# Patient Record
Sex: Male | Born: 1950 | Race: White | Hispanic: No | Smoking: Former smoker
Health system: Southern US, Community
[De-identification: ages and names within clinical notes are randomized; demographics above are authoritative.]

## PROBLEM LIST (undated history)

## (undated) DIAGNOSIS — J40 Bronchitis, not specified as acute or chronic: Secondary | ICD-10-CM

## (undated) DIAGNOSIS — B353 Tinea pedis: Secondary | ICD-10-CM

## (undated) DIAGNOSIS — R739 Hyperglycemia, unspecified: Secondary | ICD-10-CM

## (undated) DIAGNOSIS — E785 Hyperlipidemia, unspecified: Secondary | ICD-10-CM

## (undated) DIAGNOSIS — K469 Unspecified abdominal hernia without obstruction or gangrene: Secondary | ICD-10-CM

## (undated) DIAGNOSIS — J45991 Cough variant asthma: Secondary | ICD-10-CM

## (undated) DIAGNOSIS — N509 Disorder of male genital organs, unspecified: Secondary | ICD-10-CM

## (undated) DIAGNOSIS — M1990 Unspecified osteoarthritis, unspecified site: Secondary | ICD-10-CM

## (undated) DIAGNOSIS — G4700 Insomnia, unspecified: Secondary | ICD-10-CM

## (undated) DIAGNOSIS — M549 Dorsalgia, unspecified: Secondary | ICD-10-CM

## (undated) DIAGNOSIS — N529 Male erectile dysfunction, unspecified: Secondary | ICD-10-CM

## (undated) DIAGNOSIS — K649 Unspecified hemorrhoids: Secondary | ICD-10-CM

## (undated) DIAGNOSIS — B009 Herpesviral infection, unspecified: Secondary | ICD-10-CM

## (undated) DIAGNOSIS — M109 Gout, unspecified: Secondary | ICD-10-CM

## (undated) DIAGNOSIS — H9190 Unspecified hearing loss, unspecified ear: Secondary | ICD-10-CM

## (undated) DIAGNOSIS — A6000 Herpesviral infection of urogenital system, unspecified: Secondary | ICD-10-CM

## (undated) DIAGNOSIS — I10 Essential (primary) hypertension: Secondary | ICD-10-CM

## (undated) HISTORY — DX: Unspecified hearing loss, unspecified ear: H91.90

## (undated) HISTORY — DX: Hyperlipidemia, unspecified: E78.5

## (undated) HISTORY — DX: Gout, unspecified: M10.9

## (undated) HISTORY — PX: TONSILLECTOMY: SUR1361

## (undated) HISTORY — DX: Insomnia, unspecified: G47.00

## (undated) HISTORY — DX: Bronchitis, not specified as acute or chronic: J40

## (undated) HISTORY — DX: Essential (primary) hypertension: I10

## (undated) HISTORY — DX: Herpesviral infection of urogenital system, unspecified: A60.00

## (undated) HISTORY — DX: Cough variant asthma: J45.991

## (undated) HISTORY — DX: Unspecified hemorrhoids: K64.9

## (undated) HISTORY — DX: Disorder of male genital organs, unspecified: N50.9

## (undated) HISTORY — DX: Hyperglycemia, unspecified: R73.9

## (undated) HISTORY — DX: Tinea pedis: B35.3

## (undated) HISTORY — DX: Dorsalgia, unspecified: M54.9

## (undated) HISTORY — DX: Herpesviral infection, unspecified: B00.9

## (undated) HISTORY — DX: Unspecified osteoarthritis, unspecified site: M19.90

## (undated) HISTORY — DX: Male erectile dysfunction, unspecified: N52.9

## (undated) HISTORY — DX: Unspecified abdominal hernia without obstruction or gangrene: K46.9

---
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
## 1982-12-31 HISTORY — PX: OTHER SURGICAL HISTORY: SHX169

## 1990-12-31 HISTORY — PX: OTHER SURGICAL HISTORY: SHX169

## 1996-01-01 HISTORY — PX: NASAL SEPTUM SURGERY: SHX37

## 1996-12-31 LAB — HM COLONOSCOPY

## 1996-12-31 LAB — HM SIGMOIDOSCOPY: HM Sigmoidoscopy: NORMAL

## 1998-06-06 ENCOUNTER — Ambulatory Visit (HOSPITAL_COMMUNITY): Admission: RE | Admit: 1998-06-06 | Discharge: 1998-06-06 | Payer: Self-pay | Admitting: Internal Medicine

## 1998-12-31 HISTORY — PX: OTHER SURGICAL HISTORY: SHX169

## 2000-12-31 HISTORY — PX: UMBILICAL HERNIA REPAIR: SHX196

## 2005-03-06 ENCOUNTER — Emergency Department (HOSPITAL_COMMUNITY): Admission: EM | Admit: 2005-03-06 | Discharge: 2005-03-06 | Payer: Self-pay | Admitting: Emergency Medicine

## 2013-04-30 ENCOUNTER — Other Ambulatory Visit: Payer: Self-pay | Admitting: *Deleted

## 2013-04-30 MED ORDER — ACYCLOVIR 400 MG PO TABS: ORAL_TABLET | ORAL | Status: DC

## 2013-06-02 ENCOUNTER — Other Ambulatory Visit: Payer: Self-pay | Admitting: *Deleted

## 2013-06-02 MED ORDER — ZOLPIDEM TARTRATE 5 MG PO TABS: 5.0000 mg | ORAL_TABLET | Freq: Every evening | ORAL | Status: DC | PRN

## 2013-08-17 ENCOUNTER — Other Ambulatory Visit: Payer: Self-pay | Admitting: Geriatric Medicine

## 2013-08-17 MED ORDER — FEBUXOSTAT 40 MG PO TABS: 40.0000 mg | ORAL_TABLET | Freq: Every day | ORAL | Status: DC

## 2013-08-19 ENCOUNTER — Encounter: Payer: Self-pay | Admitting: Internal Medicine

## 2013-11-02 ENCOUNTER — Other Ambulatory Visit: Payer: Self-pay | Admitting: Internal Medicine

## 2013-11-03 ENCOUNTER — Other Ambulatory Visit: Payer: Self-pay | Admitting: *Deleted

## 2013-11-30 ENCOUNTER — Other Ambulatory Visit: Payer: Medicare HMO

## 2013-11-30 ENCOUNTER — Encounter: Payer: Self-pay | Admitting: *Deleted

## 2013-11-30 ENCOUNTER — Other Ambulatory Visit: Payer: Self-pay | Admitting: *Deleted

## 2013-11-30 DIAGNOSIS — E785 Hyperlipidemia, unspecified: Secondary | ICD-10-CM

## 2013-11-30 DIAGNOSIS — I10 Essential (primary) hypertension: Secondary | ICD-10-CM

## 2013-11-30 DIAGNOSIS — M109 Gout, unspecified: Secondary | ICD-10-CM

## 2013-12-01 LAB — LIPID PANEL
Chol/HDL Ratio: 3.9 ratio units (ref 0.0–5.0)
LDL Calculated: 129 mg/dL — ABNORMAL HIGH (ref 0–99)

## 2013-12-01 LAB — COMPREHENSIVE METABOLIC PANEL
Albumin: 4.6 g/dL (ref 3.6–4.8)
BUN/Creatinine Ratio: 11 (ref 10–22)
Creatinine, Ser: 0.94 mg/dL (ref 0.76–1.27)
GFR calc Af Amer: 99 mL/min/{1.73_m2} (ref >59)
GFR calc non Af Amer: 85 mL/min/{1.73_m2} (ref >59)
Potassium: 4.7 mmol/L (ref 3.5–5.2)
Sodium: 142 mmol/L (ref 134–144)
Total Protein: 6.6 g/dL (ref 6.0–8.5)

## 2013-12-01 LAB — URIC ACID: Uric Acid: 6.1 mg/dL (ref 3.7–8.6)

## 2013-12-02 ENCOUNTER — Encounter: Payer: Self-pay | Admitting: Internal Medicine

## 2013-12-02 ENCOUNTER — Ambulatory Visit (INDEPENDENT_AMBULATORY_CARE_PROVIDER_SITE_OTHER): Payer: Medicare HMO | Admitting: Internal Medicine

## 2013-12-02 VITALS — BP 146/98 | HR 79 | Resp 14 | Ht 69.08 in | Wt 216.0 lb

## 2013-12-02 DIAGNOSIS — B009 Herpesviral infection, unspecified: Secondary | ICD-10-CM | POA: Insufficient documentation

## 2013-12-02 DIAGNOSIS — G4700 Insomnia, unspecified: Secondary | ICD-10-CM

## 2013-12-02 DIAGNOSIS — R739 Hyperglycemia, unspecified: Secondary | ICD-10-CM | POA: Insufficient documentation

## 2013-12-02 DIAGNOSIS — I10 Essential (primary) hypertension: Secondary | ICD-10-CM | POA: Insufficient documentation

## 2013-12-02 DIAGNOSIS — A6000 Herpesviral infection of urogenital system, unspecified: Secondary | ICD-10-CM

## 2013-12-02 DIAGNOSIS — E785 Hyperlipidemia, unspecified: Secondary | ICD-10-CM | POA: Insufficient documentation

## 2013-12-02 DIAGNOSIS — N529 Male erectile dysfunction, unspecified: Secondary | ICD-10-CM | POA: Insufficient documentation

## 2013-12-02 DIAGNOSIS — M109 Gout, unspecified: Secondary | ICD-10-CM | POA: Insufficient documentation

## 2013-12-02 DIAGNOSIS — R7309 Other abnormal glucose: Secondary | ICD-10-CM

## 2013-12-02 DIAGNOSIS — Z23 Encounter for immunization: Secondary | ICD-10-CM

## 2013-12-02 MED ORDER — AMLODIPINE BESYLATE 5 MG PO TABS: ORAL_TABLET | ORAL | Status: DC

## 2013-12-02 MED ORDER — TADALAFIL 20 MG PO TABS: 20.0000 mg | ORAL_TABLET | Freq: Every day | ORAL | Status: DC | PRN

## 2013-12-02 MED ORDER — ACYCLOVIR 400 MG PO TABS: ORAL_TABLET | ORAL | Status: DC

## 2013-12-02 MED ORDER — FEBUXOSTAT 40 MG PO TABS: ORAL_TABLET | ORAL | Status: DC

## 2013-12-02 MED ORDER — LOSARTAN POTASSIUM 100 MG PO TABS: ORAL_TABLET | ORAL | Status: DC

## 2013-12-02 MED ORDER — ZOLPIDEM TARTRATE 5 MG PO TABS: ORAL_TABLET | ORAL | Status: DC

## 2013-12-02 MED ORDER — TETANUS-DIPHTH-ACELL PERTUSSIS 5-2.5-18.5 LF-MCG/0.5 IM SUSP: 0.5000 mL | Freq: Once | INTRAMUSCULAR | Status: DC

## 2013-12-02 NOTE — Patient Instructions (Signed)
Add amlodipine to control BP

## 2013-12-02 NOTE — Progress Notes (Signed)
Patient ID: Rodney Foster, male   DOB: [birth year], 63 y.o.   MRN: [id number]         Place of Service:  [facility]  PCP: Kimber Relic, MD  Code Status: FULL CODE  No Known Allergies  Chief Complaint  Patient presents with  . Annual Exam    physical with no labs prior  . Immunizations    patient would like to update flu vaccine today, rx to be given fot TD vaccine     HPI:  Genital herpes - no recent attacks. Using acyclovir (ZOVIRAX) 400 MG tablet.  Hyperlipidemia -: controlled  Gout -: controlled. Using febuxostat (ULORIC) 40 MG tablet  Hypertension - controlled on losartan (COZAAR) 100 MG tablet, amLODipine (NORVASC) 5 MG tablet  Hyperglycemia:normal glucose on most recent lab.  Insomnia, unspecified - Benefits from zolpidem (AMBIEN) 5 MG tablet  Impotence - Plan: tadalafil (CIALIS) 20 MG tablet  Immunization due - recommended Tdap (BOOSTRIX) 5-2.5-18.5 LF-MCG/0.5 injection  Need for prophylactic vaccination and inoculation against influenza  Needs colonoscopy.      Past Medical History  Diagnosis Date  . Genital herpes   . Herpes simplex   . Tinea pedis, right   . Hyperlipidemia   . Gout   . Hypertension   . Hyperglycemia   . Hearing loss   . Hemorrhoids   . Bronchitis   . Hernia   . Unspecified disorder of male genital organs   . Osteoarthrosis, unspecified whether generalized or localized, unspecified site   . Backache, unspecified   . Insomnia, unspecified   . Problems with sexual function     Past Surgical History  Procedure Laterality Date  . Tonsillectomy  1959  . Fracture left femur  1984  . Nasal septum surgery  1997  . Ganglion repair of left forarm  2000  . Umbilical hernia repair  2002  . Ivp  35    ML old comp fracture    CONSULTANTS ENT: Dr. Arletha Grippe GI: Dr. Virginia Rochester  General surgery: Dr. Orson Slick Hand surgery: Dr. Mina Marble Urology: Dr. Isabel Caprice Orthopedics: Dr. Thurston Hole  PAST PROCEDURES 1991 flexible sigmoidoscopy/barium enema:  Normal 1992 IVP: Normal. Incidental finding of old compression fracture of lumbar vertebra. 1994 CT abdomen 1998 flexible sigmoidoscopy: Hemorrhoids. Rectal polyp. 1998 colonoscopy: Rectal polyp 1999 CT sinus 1999 CT chest: Right lower lobe infiltrate 2000 CT abdomen/pelvis: Normal  Social History: History   Social History  . Marital Status: Divorced    Spouse Name: N/A    Number of Children: N/A  . Years of Education: N/A   Social History Main Topics  . Smoking status: Former Games developer  . Smokeless tobacco: Never Used     Comment: Quit about 30 years ago or longer   . Alcohol Use: 0.0 oz/week    6-7 Glasses of wine per week  . Drug Use: No  . Sexual Activity: None   Other Topics Concern  . None   Social History Narrative  . None    Family History Family Status  Relation Status Death Age  . Mother Deceased 66  . Father Deceased 70  . Brother Deceased   . Brother Alive   . Daughter Alive   . Son Alive   . Daughter Alive    Family History  Problem Relation Age of Onset  . COPD Mother   . Aortic aneurysm Father      Medications: Patient's Medications  New Prescriptions   No medications on file  Previous Medications  ACYCLOVIR (ZOVIRAX) 400 MG TABLET    Take one tablet to prevent herpes. Take one 5 times a day to treat active herpes.   FEBUXOSTAT (ULORIC) 40 MG TABLET    Take 1 tablet (40 mg total) by mouth daily.   LOSARTAN (COZAAR) 100 MG TABLET    Take 100 mg by mouth daily.   TADALAFIL (CIALIS) 20 MG TABLET    Take 20 mg by mouth daily as needed for erectile dysfunction.   TDAP (BOOSTRIX) 5-2.5-18.5 LF-MCG/0.5 INJECTION    Inject 0.5 mLs into the muscle once.   ZOLPIDEM (AMBIEN) 5 MG TABLET    Take one tablet nightly at bedtime as needed sleep  Modified Medications   No medications on file  Discontinued Medications   No medications on file    Immunization History  Administered Date(s) Administered  . Influenza-Unspecified 12/05/2011  . Td  12/31/1994     Review of Systems  Constitutional: Negative for fever, chills, weight loss and malaise/fatigue.  HENT: Negative for congestion and hearing loss.   Eyes: Negative for blurred vision and double vision.       Corrective lenses  Respiratory: Negative for cough, hemoptysis and shortness of breath.   Cardiovascular: Negative for chest pain, palpitations, orthopnea, leg swelling and PND.  Gastrointestinal: Negative for heartburn, nausea, vomiting, abdominal pain, constipation and blood in stool.       History of hemorrhoids and 1 colon polyp.  Genitourinary: Negative for dysuria, urgency, frequency, hematuria and flank pain.  Musculoskeletal: Negative for back pain, joint pain, myalgias and neck pain.  Skin: Negative for itching and rash.  Neurological: Negative.  Negative for weakness and headaches.  Psychiatric/Behavioral: Negative.      Filed Vitals:   12/02/13 1428  BP: 146/98  Pulse: 79  Resp: 14  Height: 5' 9.08" (1.755 m)  Weight: 216 lb (97.977 kg)  SpO2: 96%   Physical Exam  Constitutional: He is oriented to person, place, and time. He appears well-developed and well-nourished. No distress.  HENT:  Head: Normocephalic.  Right Ear: External ear normal.  Left Ear: External ear normal.  Mouth/Throat: Oropharynx is clear and moist.  Eyes: Conjunctivae and EOM are normal. Pupils are equal, round, and reactive to light.  Wears corrective lenses  Neck: Normal range of motion. Neck supple. No JVD present. No tracheal deviation present. No thyromegaly present.  Cardiovascular: Normal rate, regular rhythm, normal heart sounds and intact distal pulses.  Exam reveals no gallop and no friction rub.   No murmur heard. Pulmonary/Chest: No respiratory distress. He has no wheezes. He has no rales.  Abdominal: He exhibits no distension and no mass. There is no tenderness. No hernia.  Genitourinary: Rectum normal, prostate normal and penis normal.  Musculoskeletal: He  exhibits no edema and no tenderness.  Neurological: He is alert and oriented to person, place, and time. He displays normal reflexes. No cranial nerve deficit. He exhibits normal muscle tone. Coordination normal.  Skin: No erythema. No pallor.  Psychiatric: He has a normal mood and affect. His behavior is normal. Judgment and thought content normal.      Labs reviewed: Abstract on 11/30/2013  Component Date Value Range Status  . HM Sigmoidoscopy 12/31/1996 normal   Final  . HM Colonoscopy 12/31/1996 rectal polyp   Final  Appointment on 11/30/2013  Component Date Value Range Status  . Uric Acid 11/30/2013 6.1  3.7 - 8.6 mg/dL Final              Therapeutic target  for gout patients: <6.0  . Cholesterol, Total 11/30/2013 232* 100 - 199 mg/dL Final  . Triglycerides 11/30/2013 218* 0 - 149 mg/dL Final  . HDL 16/10/9603 59  >39 mg/dL Final   Comment: According to ATP-III Guidelines, HDL-C >59 mg/dL is considered a                          negative risk factor for CHD.  Marland Kitchen VLDL Cholesterol Cal 11/30/2013 44* 5 - 40 mg/dL Final  . LDL Calculated 11/30/2013 540* 0 - 99 mg/dL Final  . Chol/HDL Ratio 11/30/2013 3.9  0.0 - 5.0 ratio units Final   Comment:                                   T. Chol/HDL Ratio                                                                      Men  Women                                                        1/2 Avg.Risk  3.4    3.3                                                            Avg.Risk  5.0    4.4                                                         2X Avg.Risk  9.6    7.1                                                         3X Avg.Risk 23.4   11.0  . Glucose 11/30/2013 86  65 - 99 mg/dL Final  . BUN 98/10/9146 10  8 - 27 mg/dL Final  . Creatinine, Ser 11/30/2013 0.94  0.76 - 1.27 mg/dL Final  . GFR calc non Af Amer 11/30/2013 85  >59 mL/min/1.73 Final  . GFR calc Af Amer 11/30/2013 99  >59 mL/min/1.73 Final  . BUN/Creatinine Ratio 11/30/2013  11  10 - 22 Final  . Sodium 11/30/2013 142  134 - 144 mmol/L Final  . Potassium 11/30/2013 4.7  3.5 - 5.2 mmol/L Final  . Chloride 11/30/2013 100  97 - 108 mmol/L Final  . CO2 11/30/2013 22  18 - 29 mmol/L Final  . Calcium 11/30/2013 9.3  8.6 - 10.2 mg/dL Final  . Total Protein 11/30/2013 6.6  6.0 - 8.5 g/dL Final  . Albumin 16/10/9603 4.6  3.6 - 4.8 g/dL Final  . Globulin, Total 11/30/2013 2.0  1.5 - 4.5 g/dL Final  . Albumin/Globulin Ratio 11/30/2013 2.3  1.1 - 2.5 Final  . Total Bilirubin 11/30/2013 0.3  0.0 - 1.2 mg/dL Final  . Alkaline Phosphatase 11/30/2013 93  39 - 117 IU/L Final  . AST 11/30/2013 35  0 - 40 IU/L Final  . ALT 11/30/2013 52* 0 - 44 IU/L Final      Assessment/ Plan Genital herpes - Plan: acyclovir (ZOVIRAX) 400 MG tablet  Hyperlipidemia - Plan: Lipid panel  Gout - Plan: febuxostat (ULORIC) 40 MG tablet  Hypertension - Plan: losartan (COZAAR) 100 MG tablet, amLODipine (NORVASC) 5 MG tablet  Hyperglycemia: Routine monitoring. Dietary control only.  Insomnia, unspecified - Plan: zolpidem (AMBIEN) 5 MG tablet  Impotence - Plan: tadalafil (CIALIS) 20 MG tablet  Immunization due - Plan: Tdap (BOOSTRIX) 5-2.5-18.5 LF-MCG/0.5 injection  Need for prophylactic vaccination and inoculation against influenza

## 2013-12-03 ENCOUNTER — Ambulatory Visit
Admission: RE | Admit: 2013-12-03 | Discharge: 2013-12-03 | Disposition: A | Discharge status: Home / Self Care | Payer: Medicare HMO | Source: Ambulatory Visit | Attending: Nurse Practitioner | Admitting: Nurse Practitioner

## 2013-12-03 ENCOUNTER — Encounter: Payer: Self-pay | Admitting: Nurse Practitioner

## 2013-12-03 ENCOUNTER — Ambulatory Visit (INDEPENDENT_AMBULATORY_CARE_PROVIDER_SITE_OTHER): Payer: Medicare HMO | Admitting: Nurse Practitioner

## 2013-12-03 ENCOUNTER — Other Ambulatory Visit: Payer: Self-pay | Admitting: *Deleted

## 2013-12-03 ENCOUNTER — Telehealth: Payer: Self-pay | Admitting: *Deleted

## 2013-12-03 VITALS — BP 136/82 | HR 104 | Temp 100.0°F | Resp 20 | Wt 216.0 lb

## 2013-12-03 DIAGNOSIS — R05 Cough: Secondary | ICD-10-CM

## 2013-12-03 DIAGNOSIS — R0989 Other specified symptoms and signs involving the circulatory and respiratory systems: Secondary | ICD-10-CM

## 2013-12-03 DIAGNOSIS — B9789 Other viral agents as the cause of diseases classified elsewhere: Secondary | ICD-10-CM

## 2013-12-03 MED ORDER — OSELTAMIVIR PHOSPHATE 75 MG PO CAPS: 75.0000 mg | ORAL_CAPSULE | Freq: Two times a day (BID) | ORAL | Status: DC

## 2013-12-03 NOTE — Progress Notes (Signed)
Patient ID: Rodney Foster, male   DOB: 02-22-1950, 63 y.o.   MRN: [id number]    No Known Allergies  Chief Complaint  Patient presents with  . Acute Visit    nasal/chest congestion, achy all over since about 3:00 am.  had flu shot 12/3  he took 2 baby aspriin this am  but through them up.    HPI: Patient is a 63 y.o. male seen in the office today for fever for 1 day Starting at 3 am today feeling achy, muscle pains, vomiting X1 this morning- Having nausea, no diarrhea Nasal drainage into chest that he is coughing up green sputum.  No shortness of breath, no sore throat, no trouble swallowing  No appetite. unaware of any sick contacts  CXR done- no pneumonia seen  Review of Systems:  Review of Systems  Constitutional: Positive for fever, chills and malaise/fatigue.  HENT: Negative for congestion, ear pain and sore throat.   Respiratory: Positive for cough and sputum production. Negative for shortness of breath and wheezing.   Cardiovascular: Negative for chest pain, palpitations and leg swelling.  Gastrointestinal: Positive for nausea and vomiting. Negative for abdominal pain, diarrhea and constipation.  Musculoskeletal: Positive for myalgias.  Skin: Negative.   Neurological: Positive for headaches. Negative for dizziness.     Past Medical History  Diagnosis Date  . Genital herpes   . Herpes simplex   . Tinea pedis, right   . Hyperlipidemia   . Gout   . Hypertension   . Hyperglycemia   . Hearing loss   . Hemorrhoids   . Bronchitis   . Hernia   . Unspecified disorder of male genital organs   . Osteoarthrosis, unspecified whether generalized or localized, unspecified site   . Backache, unspecified   . Insomnia, unspecified   . Impotence    Past Surgical History  Procedure Laterality Date  . Tonsillectomy  1959  . Fracture left femur  1984  . Nasal septum surgery  1997  . Ganglion repair of left forarm  2000  . Umbilical hernia repair  2002  . Ivp  73    ML old  comp fracture   Social History:   reports that he has quit smoking. He has never used smokeless tobacco. He reports that he drinks alcohol. He reports that he does not use illicit drugs.  Family History  Problem Relation Age of Onset  . COPD Mother   . Aortic aneurysm Father     Medications: Patient's Medications  New Prescriptions   No medications on file  Previous Medications   ACYCLOVIR (ZOVIRAX) 400 MG TABLET    Take one tablet to prevent herpes. Take one 5 times a day to treat active herpes.   AMLODIPINE (NORVASC) 5 MG TABLET    One tablet daily to control BP   FEBUXOSTAT (ULORIC) 40 MG TABLET    One daily to prevent gout   LOSARTAN (COZAAR) 100 MG TABLET    One daily to control BP   TADALAFIL (CIALIS) 20 MG TABLET    Take 1 tablet (20 mg total) by mouth daily as needed for erectile dysfunction.   TDAP (BOOSTRIX) 5-2.5-18.5 LF-MCG/0.5 INJECTION    Inject 0.5 mLs into the muscle once.   ZOLPIDEM (AMBIEN) 5 MG TABLET    Take one tablet nightly at bedtime as needed sleep  Modified Medications   No medications on file  Discontinued Medications   No medications on file     Physical Exam:  Filed Vitals:  12/03/13 1422  BP: 136/82  Pulse: 104  Temp: 100 F (37.8 C)  TempSrc: Oral  Resp: 20  Weight: 216 lb (97.977 kg)  SpO2: 96%   Physical Exam  Constitutional: He is oriented to person, place, and time and well-developed, well-nourished, and in no distress. No distress.  HENT:  Head: Normocephalic and atraumatic.  Right Ear: External ear normal.  Left Ear: External ear normal.  Nose: Nose normal.  Mouth/Throat: Oropharynx is clear and moist. No oropharyngeal exudate.  Eyes: Conjunctivae and EOM are normal. Pupils are equal, round, and reactive to light.  Neck: Normal range of motion. Neck supple. No thyromegaly present.  Cardiovascular: Normal rate, regular rhythm and normal heart sounds.   Pulmonary/Chest: Effort normal and breath sounds normal. No respiratory  distress. He has no wheezes. He exhibits no tenderness.  Abdominal: Soft. Bowel sounds are normal. He exhibits no distension.  Musculoskeletal: He exhibits no edema and no tenderness.  Lymphadenopathy:    He has no cervical adenopathy.  Neurological: He is alert and oriented to person, place, and time.  Skin: Skin is warm and dry. He is not diaphoretic.  Psychiatric: Affect normal.     Labs reviewed: Basic Metabolic Panel:  Recent Labs  16/10/96 1001  NA 142  K 4.7  CL 100  CO2 22  GLUCOSE 86  BUN 10  CREATININE 0.94  CALCIUM 9.3   Liver Function Tests:  Recent Labs  11/30/13 1001  AST 35  ALT 52*  ALKPHOS 93  BILITOT 0.3  PROT 6.6   No results found for this basename: LIPASE, AMYLASE,  in the last 8760 hours No results found for this basename: AMMONIA,  in the last 8760 hours CBC: No results found for this basename: WBC, NEUTROABS, HGB, HCT, MCV, PLT,  in the last 8760 hours Lipid Panel:  Recent Labs  11/30/13 1001  HDL 59  LDLCALC 129*  TRIG 218*  CHOLHDL 3.9   TSH: No results found for this basename: TSH,  in the last 8760 hours A1C: No components found with this basename: A1C,   Assessment/Plan 1. Viral respiratory illness -educated pt this was not from the flu shot  -encouraged pt to increase fluids and eat as tolerated, advanced diet as tolerates -may take tylenol around the clock 1000 mg every 8 hours for 4 days (max dose of 3000 mg in 24 hours) for body aches -can not test for flu due to no influenza A&B swab in clinic therefore Will send to pharm oseltamivir (TAMIFLU) 75 MG capsule; Take 1 capsule (75 mg total) by mouth 2 (two) times daily.  Dispense: 10 capsule; Refill: 0 -mucinex DM 1 tablet twice daily for cough and congestion with full glass of water  - to follow up in clinic on Monday if no better or seek emergency care if becomes short of breath, unable to tolerate PO intake, increase in fevers pt understands

## 2013-12-03 NOTE — Patient Instructions (Signed)
May take Mucinex DM 1 tablet twice daily  Take tylenol 1000 mg- (2 of the 500 mg tablets) 3 times daily around the clock for the next 4 days then as needed for fevers or body aches Follow up with [place] if no better on Monday If unable to keep down fluids and food seek medical care before Monday

## 2013-12-03 NOTE — Telephone Encounter (Signed)
Patient walked into clinic claiming he got his flu shot 12/02/2013 and woke up very sick this morning 3:00am. I could hear the congestion in patient's lungs. Patient claims no chest pains but nausea and cough and congestion. States that he thinks the flu shot gave him the flu. I spoke with Shanda Bumps and she stated to send the patient to get a chest x-ray and come back at 2:15 to be seen. Patient agreed and gave him directions to [facility]. Placed order into Epic.

## 2013-12-10 ENCOUNTER — Telehealth: Payer: Self-pay | Admitting: *Deleted

## 2013-12-10 NOTE — Telephone Encounter (Signed)
Feeling no Better. Fever is gone but sinuses stopped up, fatigue and chest congestion. Wants something called in. Wants to get better so he can go back to work.

## 2013-12-10 NOTE — Telephone Encounter (Signed)
Increase fluid intake; mucinex DM is OTC take this twice daily with full glass of water May use plain saline spray into nares as needed

## 2013-12-11 NOTE — Telephone Encounter (Signed)
Patient Notified

## 2014-01-08 ENCOUNTER — Other Ambulatory Visit: Payer: Self-pay | Admitting: *Deleted

## 2014-01-08 DIAGNOSIS — G4700 Insomnia, unspecified: Secondary | ICD-10-CM

## 2014-01-08 DIAGNOSIS — I10 Essential (primary) hypertension: Secondary | ICD-10-CM

## 2014-01-08 DIAGNOSIS — N529 Male erectile dysfunction, unspecified: Secondary | ICD-10-CM

## 2014-01-08 DIAGNOSIS — M109 Gout, unspecified: Secondary | ICD-10-CM

## 2014-01-08 MED ORDER — ZOLPIDEM TARTRATE 5 MG PO TABS: ORAL_TABLET | ORAL | Status: DC

## 2014-01-08 MED ORDER — TADALAFIL 20 MG PO TABS: 20.0000 mg | ORAL_TABLET | Freq: Every day | ORAL | Status: DC | PRN

## 2014-01-08 MED ORDER — FEBUXOSTAT 40 MG PO TABS: ORAL_TABLET | ORAL | Status: DC

## 2014-01-08 MED ORDER — AMLODIPINE BESYLATE 5 MG PO TABS: ORAL_TABLET | ORAL | Status: DC

## 2014-01-12 ENCOUNTER — Telehealth: Payer: Self-pay | Admitting: *Deleted

## 2014-01-12 DIAGNOSIS — A6000 Herpesviral infection of urogenital system, unspecified: Secondary | ICD-10-CM

## 2014-01-12 DIAGNOSIS — N529 Male erectile dysfunction, unspecified: Secondary | ICD-10-CM

## 2014-01-12 DIAGNOSIS — M109 Gout, unspecified: Secondary | ICD-10-CM

## 2014-01-12 DIAGNOSIS — G4700 Insomnia, unspecified: Secondary | ICD-10-CM

## 2014-01-12 DIAGNOSIS — I10 Essential (primary) hypertension: Secondary | ICD-10-CM

## 2014-01-12 MED ORDER — LOSARTAN POTASSIUM 100 MG PO TABS
ORAL_TABLET | ORAL | Status: DC
Start: 2014-01-12 — End: 2015-01-28

## 2014-01-12 MED ORDER — AMLODIPINE BESYLATE 5 MG PO TABS: ORAL_TABLET | ORAL | Status: DC

## 2014-01-12 MED ORDER — TADALAFIL 20 MG PO TABS: 20.0000 mg | ORAL_TABLET | Freq: Every day | ORAL | Status: DC | PRN

## 2014-01-12 MED ORDER — ACYCLOVIR 400 MG PO TABS: ORAL_TABLET | ORAL | Status: DC

## 2014-01-12 MED ORDER — ZOLPIDEM TARTRATE 5 MG PO TABS: ORAL_TABLET | ORAL | Status: DC

## 2014-01-12 MED ORDER — FEBUXOSTAT 40 MG PO TABS: ORAL_TABLET | ORAL | Status: DC

## 2014-01-12 NOTE — Telephone Encounter (Signed)
Called and spoke with Pharmacist at [facility] [phone] and called in all of patient's Rx's for mail order.

## 2014-03-09 ENCOUNTER — Other Ambulatory Visit: Payer: Commercial Managed Care - HMO

## 2014-03-09 DIAGNOSIS — E785 Hyperlipidemia, unspecified: Secondary | ICD-10-CM

## 2014-03-10 ENCOUNTER — Encounter: Payer: Self-pay | Admitting: Internal Medicine

## 2014-03-10 ENCOUNTER — Ambulatory Visit (INDEPENDENT_AMBULATORY_CARE_PROVIDER_SITE_OTHER): Payer: Medicare HMO | Admitting: Internal Medicine

## 2014-03-10 VITALS — BP 142/80 | HR 80 | Temp 97.4°F | Resp 20 | Ht 69.0 in | Wt 202.6 lb

## 2014-03-10 DIAGNOSIS — E785 Hyperlipidemia, unspecified: Secondary | ICD-10-CM

## 2014-03-10 DIAGNOSIS — I10 Essential (primary) hypertension: Secondary | ICD-10-CM

## 2014-03-10 LAB — LIPID PANEL
CHOL/HDL RATIO: 3.5 ratio (ref 0.0–5.0)
Cholesterol, Total: 214 mg/dL — ABNORMAL HIGH (ref 100–199)
HDL: 61 mg/dL (ref >39)
LDL Calculated: 117 mg/dL — ABNORMAL HIGH (ref 0–99)
Triglycerides: 178 mg/dL — ABNORMAL HIGH (ref 0–149)
VLDL Cholesterol Cal: 36 mg/dL (ref 5–40)

## 2014-03-10 NOTE — Patient Instructions (Addendum)
Continue medications as listed 

## 2014-03-10 NOTE — Progress Notes (Signed)
Patient ID: Rodney Foster, male   DOB: 1950/09/03, 64 y.o.   MRN: [id number]    Location:    [facility]  Place of Service:  OFFICE   No Known Allergies  Chief Complaint  Patient presents with  . Follow-up    lab results    HPI:   Hyperlipidemia: improved. Has lost 14# since last visit.  Hypertension: improved. Tolerating losartan and amlodipine.    Medications: Patient's Medications  New Prescriptions   No medications on file  Previous Medications   ACYCLOVIR (ZOVIRAX) 400 MG TABLET    Take one tablet to prevent herpes. Take one 5 times a day to treat active herpes.   AMLODIPINE (NORVASC) 5 MG TABLET    One tablet daily to control BP   FEBUXOSTAT (ULORIC) 40 MG TABLET    One daily to prevent gout   LOSARTAN (COZAAR) 100 MG TABLET    One daily to control BP   TADALAFIL (CIALIS) 20 MG TABLET    Take 1 tablet (20 mg total) by mouth daily as needed for erectile dysfunction.   ZOLPIDEM (AMBIEN) 5 MG TABLET    Take one tablet nightly at bedtime as needed sleep  Modified Medications   No medications on file  Discontinued Medications   OSELTAMIVIR (TAMIFLU) 75 MG CAPSULE    Take 1 capsule (75 mg total) by mouth 2 (two) times daily.   TDAP (BOOSTRIX) 5-2.5-18.5 LF-MCG/0.5 INJECTION    Inject 0.5 mLs into the muscle once.     Review of Systems  Constitutional: Negative for chills, diaphoresis, activity change and appetite change.       Intentional weight loss  HENT: Negative.   Eyes: Negative.   Respiratory: Negative.        Chronically hoarse  Cardiovascular: Negative for chest pain, palpitations and leg swelling.  Gastrointestinal: Negative for nausea, abdominal pain, diarrhea, abdominal distention and rectal pain.  Endocrine: Negative.   Genitourinary: Negative.        Impotent. Cialis helps.  Musculoskeletal: Negative.   Allergic/Immunologic: Negative.   Neurological: Negative.   Hematological: Negative.   Psychiatric/Behavioral: Negative.     Filed Vitals:   03/10/14  1157  BP: 142/80  Pulse: 80  Temp: 97.4 F (36.3 C)  TempSrc: Oral  Resp: 20  Height: 5\' 9"  (1.753 m)  Weight: 202 lb 9.6 oz (91.899 kg)  SpO2: 99%   Physical Exam  Constitutional: He is oriented to person, place, and time. He appears well-developed and well-nourished. No distress.  HENT:  Right Ear: External ear normal.  Left Ear: External ear normal.  Nose: Nose normal.  Mouth/Throat: Oropharynx is clear and moist. No oropharyngeal exudate.  Eyes: Conjunctivae and EOM are normal. Pupils are equal, round, and reactive to light.  Corrective lenses  Cardiovascular: Normal rate, regular rhythm, normal heart sounds and intact distal pulses.  Exam reveals no gallop and no friction rub.   No murmur heard. Pulmonary/Chest: No respiratory distress. He has no wheezes. He has no rales. He exhibits no tenderness.  Chronic hoarseness  Abdominal: He exhibits no distension and no mass. There is no tenderness.  Musculoskeletal: Normal range of motion. He exhibits no edema and no tenderness.  Neurological: He is alert and oriented to person, place, and time. He has normal reflexes. No cranial nerve deficit. Coordination normal.  Skin: No rash noted. No erythema. No pallor.  Psychiatric: His behavior is normal. Thought content normal.     Labs reviewed: Appointment on 03/09/2014  Component Date Value Ref  Range Status  . Cholesterol, Total 03/09/2014 214* 100 - 199 mg/dL Final  . Triglycerides 03/09/2014 178* 0 - 149 mg/dL Final  . HDL 03/09/2014 61  >39 mg/dL Final   Comment: According to ATP-III Guidelines, HDL-C >59 mg/dL is considered a                          negative risk factor for CHD.  Marland Kitchen VLDL Cholesterol Cal 03/09/2014 36  5 - 40 mg/dL Final  . LDL Calculated 03/09/2014 117* 0 - 99 mg/dL Final  . Chol/HDL Ratio 03/09/2014 3.5  0.0 - 5.0 ratio units Final   Comment:                                   T. Chol/HDL Ratio                                                                       Men  Women                                                        1/2 Avg.Risk  3.4    3.3                                                            Avg.Risk  5.0    4.4                                                         2X Avg.Risk  9.6    7.1                                                         3X Avg.Risk 23.4   11.0      Assessment/Plan  1. Hyperlipidemia improved - Lipid panel; Future  2. Hypertension improved - CMP; Future

## 2014-04-20 ENCOUNTER — Ambulatory Visit (INDEPENDENT_AMBULATORY_CARE_PROVIDER_SITE_OTHER): Payer: Medicare HMO | Admitting: Internal Medicine

## 2014-04-20 ENCOUNTER — Encounter: Payer: Self-pay | Admitting: Internal Medicine

## 2014-04-20 VITALS — BP 142/78 | HR 82 | Temp 97.8°F | Wt 207.8 lb

## 2014-04-20 DIAGNOSIS — E785 Hyperlipidemia, unspecified: Secondary | ICD-10-CM

## 2014-04-20 DIAGNOSIS — I10 Essential (primary) hypertension: Secondary | ICD-10-CM

## 2014-04-20 DIAGNOSIS — M109 Gout, unspecified: Secondary | ICD-10-CM

## 2014-04-20 DIAGNOSIS — R7309 Other abnormal glucose: Secondary | ICD-10-CM

## 2014-04-20 DIAGNOSIS — R739 Hyperglycemia, unspecified: Secondary | ICD-10-CM

## 2014-04-20 DIAGNOSIS — R21 Rash and other nonspecific skin eruption: Secondary | ICD-10-CM

## 2014-04-20 MED ORDER — HYDROCORTISONE 1 % EX CREA: 1.0000 "application " | TOPICAL_CREAM | Freq: Two times a day (BID) | CUTANEOUS | Status: DC

## 2014-04-20 NOTE — Progress Notes (Signed)
Patient ID: Rodney Foster, male   DOB: 09/19/1950, 64 y.o.   MRN: [id number]    Location:  [facility]   Place of Service: OFFICE    No Known Allergies  Chief Complaint  Patient presents with  . Acute Visit    red/burning around mouth after shaving 6 weeks ago.  . Immunizations    will discuss Pneumo vaccine in August.    HPI:  Rash and nonspecific skin eruption:  Used new style razor blade. Did not change shaving cream or aftershave lotion.  Hypertension: controlled  Hyperlipidemia: no recent lab  Hyperglycemia: no recent lab  Gout: no recent attacks      Medications: Patient's Medications  New Prescriptions   No medications on file  Previous Medications   ACYCLOVIR (ZOVIRAX) 400 MG TABLET    Take one tablet to prevent herpes. Take one 5 times a day to treat active herpes.   AMLODIPINE (NORVASC) 5 MG TABLET    One tablet daily to control BP   FEBUXOSTAT (ULORIC) 40 MG TABLET    One daily to prevent gout   LOSARTAN (COZAAR) 100 MG TABLET    One daily to control BP   TADALAFIL (CIALIS) 20 MG TABLET    Take 1 tablet (20 mg total) by mouth daily as needed for erectile dysfunction.   ZOLPIDEM (AMBIEN) 5 MG TABLET    Take one tablet nightly at bedtime as needed sleep  Modified Medications   No medications on file  Discontinued Medications   No medications on file     Review of Systems  Constitutional: Negative for chills, diaphoresis, activity change and appetite change.       Intentional weight loss  HENT: Negative.   Eyes: Negative.   Respiratory: Negative.        Chronically hoarse  Cardiovascular: Negative for chest pain, palpitations and leg swelling.  Gastrointestinal: Negative for nausea, abdominal pain, diarrhea, abdominal distention and rectal pain.  Endocrine: Negative.   Genitourinary: Negative.        Impotent. Cialis helps.  Musculoskeletal: Negative.   Skin:       Red scaling rash around the mouth  Allergic/Immunologic: Negative.   Neurological:  Negative.   Hematological: Negative.   Psychiatric/Behavioral: Negative.     Filed Vitals:   04/20/14 1353  BP: 142/78  Pulse: 82  Temp: 97.8 F (36.6 C)  TempSrc: Oral  Weight: 207 lb 12.8 oz (94.257 kg)  SpO2: 96%   Physical Exam  Constitutional: He is oriented to person, place, and time. He appears well-developed and well-nourished. No distress.  HENT:  Right Ear: External ear normal.  Left Ear: External ear normal.  Nose: Nose normal.  Mouth/Throat: Oropharynx is clear and moist. No oropharyngeal exudate.  Eyes: Conjunctivae and EOM are normal. Pupils are equal, round, and reactive to light.  Corrective lenses  Cardiovascular: Normal rate, regular rhythm, normal heart sounds and intact distal pulses.  Exam reveals no gallop and no friction rub.   No murmur heard. Pulmonary/Chest: No respiratory distress. He has no wheezes. He has no rales. He exhibits no tenderness.  Chronic hoarseness  Abdominal: He exhibits no distension and no mass. There is no tenderness.  Musculoskeletal: Normal range of motion. He exhibits no edema and no tenderness.  Neurological: He is alert and oriented to person, place, and time. He has normal reflexes. No cranial nerve deficit. Coordination normal.  Skin: Rash (red and with fine scales around the mouth) noted. No erythema. No pallor.  Psychiatric: His  behavior is normal. Thought content normal.     Labs reviewed: Appointment on 03/09/2014  Component Date Value Ref Range Status  . Cholesterol, Total 03/09/2014 214* 100 - 199 mg/dL Final  . Triglycerides 03/09/2014 178* 0 - 149 mg/dL Final  . HDL 03/09/2014 61  >39 mg/dL Final   Comment: According to ATP-III Guidelines, HDL-C >59 mg/dL is considered a                          negative risk factor for CHD.  Marland Kitchen VLDL Cholesterol Cal 03/09/2014 36  5 - 40 mg/dL Final  . LDL Calculated 03/09/2014 117* 0 - 99 mg/dL Final  . Chol/HDL Ratio 03/09/2014 3.5  0.0 - 5.0 ratio units Final   Comment:                                    T. Chol/HDL Ratio                                                                      Men  Women                                                        1/2 Avg.Risk  3.4    3.3                                                            Avg.Risk  5.0    4.4                                                         2X Avg.Risk  9.6    7.1                                                         3X Avg.Risk 23.4   11.0      Assessment/Plan  1. Rash and nonspecific skin eruption - hydrocortisone cream 1 %; Apply 1 application topically 2 (two) times daily.  Dispense: 30 g; Refill: 0  2. Hypertension controlled  3. Hyperlipidemia Lab next visit  4. Hyperglycemia Lab next visit  5. Gout No recent attacks

## 2014-04-28 ENCOUNTER — Telehealth: Payer: Self-pay | Admitting: *Deleted

## 2014-04-28 NOTE — Telephone Encounter (Signed)
Called [employer] and initiated prior auth for Uloric. Answered questions over the phone and 24-48 hours turn around. Ref: [id number]

## 2014-05-04 NOTE — Telephone Encounter (Signed)
Patient stopped by the office and had letter from [facility] (copy made and sent to scanning) indicating Uloric was denied. Patient states he will get this medication from [facility]. Patient has rx that was given to him early December 2014. Patient was given 28 day supply of samples (noted on sample log) to hold him over until rx received from [facility], patient was completely out of medication.   Patient was very appreciative of samples and will contact us if needed.

## 2014-05-20 ENCOUNTER — Ambulatory Visit (INDEPENDENT_AMBULATORY_CARE_PROVIDER_SITE_OTHER): Payer: Commercial Managed Care - HMO | Admitting: Nurse Practitioner

## 2014-05-20 ENCOUNTER — Encounter: Payer: Self-pay | Admitting: Nurse Practitioner

## 2014-05-20 VITALS — BP 142/80 | HR 87 | Temp 98.0°F | Resp 20 | Ht 69.0 in | Wt 205.6 lb

## 2014-05-20 DIAGNOSIS — J329 Chronic sinusitis, unspecified: Secondary | ICD-10-CM

## 2014-05-20 MED ORDER — AZITHROMYCIN 250 MG PO TABS: ORAL_TABLET | ORAL | Status: DC

## 2014-05-20 NOTE — Progress Notes (Signed)
Patient ID: Rodney Foster, male   DOB: December 12, 1950, 64 y.o.   MRN: [id number]    No Known Allergies  Chief Complaint  Patient presents with  . Acute Visit    cold since Tues    HPI: Patient is a 64 y.o. male seen in the office today for cold Started 5 days ago, then 2 days ago it got much worse, achy  ibuprofen  every 4 hours for aches and pains and alka seizer every 4 hours  Worse part is aches and pain and Head is stopped up is the worse symptoms Needs to go to work Taking plain saline for his nose Does not report seasonal allergies but gets worsen sinuses symptoms after sitting in air conditions  Review of Systems:  Review of Systems  Constitutional: Positive for fever and malaise/fatigue.  HENT: Positive for congestion and sore throat (at times). Negative for ear discharge, hearing loss and tinnitus.   Respiratory: Positive for cough (from sinus drainage). Negative for sputum production, shortness of breath and wheezing.   Cardiovascular: Negative for chest pain and palpitations.  Gastrointestinal: Negative for nausea, vomiting, abdominal pain, diarrhea and constipation.  Musculoskeletal: Positive for myalgias (aches and pains).  Skin: Negative.   Neurological: Positive for headaches.     Past Medical History  Diagnosis Date  . Genital herpes   . Herpes simplex   . Tinea pedis, right   . Hyperlipidemia   . Gout   . Hypertension   . Hyperglycemia   . Hearing loss   . Hemorrhoids   . Bronchitis   . Hernia   . Unspecified disorder of male genital organs   . Osteoarthrosis, unspecified whether generalized or localized, unspecified site   . Backache, unspecified   . Insomnia, unspecified   . Impotence    Past Surgical History  Procedure Laterality Date  . Tonsillectomy  1959  . Fracture left femur  1984  . Nasal septum surgery  1997  . Ganglion repair of left forarm  2000  . Umbilical hernia repair  2002  . Ivp  26    ML old comp fracture   Social History:   reports that he quit smoking about 30 years ago. He has never used smokeless tobacco. He reports that he drinks alcohol. He reports that he does not use illicit drugs.  Family History  Problem Relation Age of Onset  . COPD Mother   . Aortic aneurysm Father     Medications: Patient's Medications  New Prescriptions   No medications on file  Previous Medications   ACYCLOVIR (ZOVIRAX) 400 MG TABLET    Take one tablet to prevent herpes. Take one 5 times a day to treat active herpes.   AMLODIPINE (NORVASC) 5 MG TABLET    One tablet daily to control BP   FEBUXOSTAT (ULORIC) 40 MG TABLET    One daily to prevent gout   HYDROCORTISONE CREAM 1 %    Apply 1 application topically 2 (two) times daily.   LOSARTAN (COZAAR) 100 MG TABLET    One daily to control BP   TADALAFIL (CIALIS) 20 MG TABLET    Take 1 tablet (20 mg total) by mouth daily as needed for erectile dysfunction.   ZOLPIDEM (AMBIEN) 5 MG TABLET    Take one tablet nightly at bedtime as needed sleep  Modified Medications   No medications on file  Discontinued Medications   No medications on file     Physical Exam:  Filed Vitals:   05/20/14  1029  BP: 142/80  Pulse: 87  Temp: 98 F (36.7 C)  TempSrc: Oral  Resp: 20  Height: 5\' 9"  (1.753 m)  Weight: 205 lb 9.6 oz (93.26 kg)  SpO2: 96%    Physical Exam  Constitutional: He is oriented to person, place, and time and well-developed, well-nourished, and in no distress. No distress.  HENT:  Head: Normocephalic and atraumatic.  Right Ear: External ear normal.  Left Ear: External ear normal.  Nose: Nose normal.  Mouth/Throat: Oropharynx is clear and moist. No oropharyngeal exudate.  Eyes: Conjunctivae and EOM are normal. Pupils are equal, round, and reactive to light.  Neck: Normal range of motion. Neck supple. No thyromegaly present.  Cardiovascular: Normal rate, regular rhythm and normal heart sounds.   Pulmonary/Chest: Effort normal and breath sounds normal. No respiratory  distress. He has no wheezes. He exhibits no tenderness.  Abdominal: Soft. Bowel sounds are normal.  Musculoskeletal: He exhibits no edema and no tenderness.  Lymphadenopathy:    He has no cervical adenopathy.  Neurological: He is alert and oriented to person, place, and time.  Skin: Skin is warm and dry. He is not diaphoretic.  Psychiatric: Affect normal.     Labs reviewed: Basic Metabolic Panel:  Recent Labs  11/30/13 1001  NA 142  K 4.7  CL 100  CO2 22  GLUCOSE 86  BUN 10  CREATININE 0.94  CALCIUM 9.3   Liver Function Tests:  Recent Labs  11/30/13 1001  AST 35  ALT 52*  ALKPHOS 93  BILITOT 0.3  PROT 6.6    Recent Labs  11/30/13 1001 03/09/14 0904  HDL 59 61  LDLCALC 129* 117*  TRIG 218* 178*  CHOLHDL 3.9 3.5    Assessment/Plan 1. Sinusitis Ibuprofen 200 mg 2 tablets every 4-6 hours -- MAX dose is 6 tablets in 24 hours OR Tylenol 500 mg 2 tablets every 6-8 hours--- MAX dose is 6 tablets in 24 hours for aches an pains Saline Nasal spray into nares as needed though out the day Netti Pot twice daily for sinuses  Loratadine (claritin) 10 mg daily for allergies  - azithromycin (ZITHROMAX) 250 MG tablet; 2 tablets today and then 1 tablet daily until complete  Dispense: 6 tablet; Refill: 0 -return precautions given

## 2014-05-20 NOTE — Patient Instructions (Signed)
Ibuprofen 200 mg 2 tablets every 4-6 hours -- MAX dose is 6 tablets in 24 hours OR Tylenol 500 mg 2 tablets every 6-8 hours--- MAX dose is 6 tablets in 24 hours   Saline Nasal spray into nares as needed though out the day Netti Pot twice daily for sinuses   Loratadine (claritin) 10 mg daily for allergies  Sinusitis Sinusitis is redness, soreness, and swelling (inflammation) of the paranasal sinuses. Paranasal sinuses are air pockets within the bones of your face (beneath the eyes, the middle of the forehead, or above the eyes). In healthy paranasal sinuses, mucus is able to drain out, and air is able to circulate through them by way of your nose. However, when your paranasal sinuses are inflamed, mucus and air can become trapped. This can allow bacteria and other germs to grow and cause infection. Sinusitis can develop quickly and last only a short time (acute) or continue over a long period (chronic). Sinusitis that lasts for more than 12 weeks is considered chronic.  CAUSES  Causes of sinusitis include:  Allergies.  Structural abnormalities, such as displacement of the cartilage that separates your nostrils (deviated septum), which can decrease the air flow through your nose and sinuses and affect sinus drainage.  Functional abnormalities, such as when the small hairs (cilia) that line your sinuses and help remove mucus do not work properly or are not present. SYMPTOMS  Symptoms of acute and chronic sinusitis are the same. The primary symptoms are pain and pressure around the affected sinuses. Other symptoms include:  Upper toothache.  Earache.  Headache.  Bad breath.  Decreased sense of smell and taste.  A cough, which worsens when you are lying flat.  Fatigue.  Fever.  Thick drainage from your nose, which often is green and may contain pus (purulent).  Swelling and warmth over the affected sinuses. DIAGNOSIS  Your caregiver will perform a physical exam. During the exam,  your caregiver may:  Look in your nose for signs of abnormal growths in your nostrils (nasal polyps).  Tap over the affected sinus to check for signs of infection.  View the inside of your sinuses (endoscopy) with a special imaging device with a light attached (endoscope), which is inserted into your sinuses. If your caregiver suspects that you have chronic sinusitis, one or more of the following tests may be recommended:  Allergy tests.  Nasal culture A sample of mucus is taken from your nose and sent to a lab and screened for bacteria.  Nasal cytology A sample of mucus is taken from your nose and examined by your caregiver to determine if your sinusitis is related to an allergy. TREATMENT  Most cases of acute sinusitis are related to a viral infection and will resolve on their own within 10 days. Sometimes medicines are prescribed to help relieve symptoms (pain medicine, decongestants, nasal steroid sprays, or saline sprays).  However, for sinusitis related to a bacterial infection, your caregiver will prescribe antibiotic medicines. These are medicines that will help kill the bacteria causing the infection.  Rarely, sinusitis is caused by a fungal infection. In theses cases, your caregiver will prescribe antifungal medicine. For some cases of chronic sinusitis, surgery is needed. Generally, these are cases in which sinusitis recurs more than 3 times per year, despite other treatments. HOME CARE INSTRUCTIONS   Drink plenty of water. Water helps thin the mucus so your sinuses can drain more easily.  Use a humidifier.  Inhale steam 3 to 4 times a day (  for example, sit in the bathroom with the shower running).  Apply a warm, moist washcloth to your face 3 to 4 times a day, or as directed by your caregiver.  Use saline nasal sprays to help moisten and clean your sinuses.  Take over-the-counter or prescription medicines for pain, discomfort, or fever only as directed by your  caregiver. SEEK IMMEDIATE MEDICAL CARE IF:  You have increasing pain or severe headaches.  You have nausea, vomiting, or drowsiness.  You have swelling around your face.  You have vision problems.  You have a stiff neck.  You have difficulty breathing. MAKE SURE YOU:   Understand these instructions.  Will watch your condition.  Will get help right away if you are not doing well or get worse. Document Released: 12/17/2005 Document Revised: 03/10/2012 Document Reviewed: 01/01/2012 [facility] Patient Information 2014 [place], [place].

## 2014-09-10 ENCOUNTER — Other Ambulatory Visit: Payer: Commercial Managed Care - HMO

## 2014-09-10 DIAGNOSIS — I10 Essential (primary) hypertension: Secondary | ICD-10-CM

## 2014-09-10 DIAGNOSIS — E785 Hyperlipidemia, unspecified: Secondary | ICD-10-CM

## 2014-09-11 LAB — COMPREHENSIVE METABOLIC PANEL
A/G RATIO: 2 (ref 1.1–2.5)
ALBUMIN: 4.6 g/dL (ref 3.6–4.8)
ALK PHOS: 93 IU/L (ref 39–117)
ALT: 34 IU/L (ref 0–44)
AST: 31 IU/L (ref 0–40)
BILIRUBIN TOTAL: 0.3 mg/dL (ref 0.0–1.2)
BUN / CREAT RATIO: 11 (ref 10–22)
BUN: 10 mg/dL (ref 8–27)
CHLORIDE: 98 mmol/L (ref 97–108)
CO2: 21 mmol/L (ref 18–29)
Calcium: 9.3 mg/dL (ref 8.6–10.2)
Creatinine, Ser: 0.91 mg/dL (ref 0.76–1.27)
GFR, EST AFRICAN AMERICAN: 102 mL/min/{1.73_m2} (ref >59)
GFR, EST NON AFRICAN AMERICAN: 88 mL/min/{1.73_m2} (ref >59)
Globulin, Total: 2.3 g/dL (ref 1.5–4.5)
Glucose: 93 mg/dL (ref 65–99)
POTASSIUM: 4.6 mmol/L (ref 3.5–5.2)
Sodium: 139 mmol/L (ref 134–144)
Total Protein: 6.9 g/dL (ref 6.0–8.5)

## 2014-09-11 LAB — LIPID PANEL
CHOLESTEROL TOTAL: 232 mg/dL — AB (ref 100–199)
Chol/HDL Ratio: 3.6 ratio units (ref 0.0–5.0)
HDL: 65 mg/dL (ref >39)
LDL CALC: 129 mg/dL — AB (ref 0–99)
TRIGLYCERIDES: 191 mg/dL — AB (ref 0–149)
VLDL CHOLESTEROL CAL: 38 mg/dL (ref 5–40)

## 2014-09-13 ENCOUNTER — Other Ambulatory Visit: Payer: Self-pay | Admitting: *Deleted

## 2014-09-13 DIAGNOSIS — G4700 Insomnia, unspecified: Secondary | ICD-10-CM

## 2014-09-13 MED ORDER — ZOLPIDEM TARTRATE 5 MG PO TABS: ORAL_TABLET | ORAL | Status: DC

## 2014-09-13 NOTE — Telephone Encounter (Signed)
Patient walked in and Requested new Rx

## 2014-09-14 ENCOUNTER — Encounter: Payer: Self-pay | Admitting: Internal Medicine

## 2014-09-14 ENCOUNTER — Ambulatory Visit (INDEPENDENT_AMBULATORY_CARE_PROVIDER_SITE_OTHER): Payer: Commercial Managed Care - HMO | Admitting: Internal Medicine

## 2014-09-14 VITALS — BP 156/86 | HR 83 | Temp 97.7°F | Resp 14 | Ht 69.0 in | Wt 203.2 lb

## 2014-09-14 DIAGNOSIS — R7309 Other abnormal glucose: Secondary | ICD-10-CM

## 2014-09-14 DIAGNOSIS — I10 Essential (primary) hypertension: Secondary | ICD-10-CM

## 2014-09-14 DIAGNOSIS — R739 Hyperglycemia, unspecified: Secondary | ICD-10-CM

## 2014-09-14 DIAGNOSIS — M1A9XX0 Chronic gout, unspecified, without tophus (tophi): Secondary | ICD-10-CM

## 2014-09-14 DIAGNOSIS — E785 Hyperlipidemia, unspecified: Secondary | ICD-10-CM

## 2014-09-14 DIAGNOSIS — M1A00X0 Idiopathic chronic gout, unspecified site, without tophus (tophi): Secondary | ICD-10-CM

## 2014-09-14 NOTE — Progress Notes (Signed)
Patient ID: Rodney Foster, male   DOB: 15-Sep-1950, 64 y.o.   MRN: [id number]    Location:    [facility]  Place of Service:  OFFICE    No Known Allergies  Chief Complaint  Patient presents with  . Medical Management of Chronic Issues    6 month follow up and to discuss labs    HPI:  Chronic gout without tophus, unspecified cause, unspecified site: controlled. No recent attacks.  Hyperglycemia: controlled by diet  Hyperlipidemia: mild elevation in total chol and LDL is offset by HDL of 60. Mild elevation in triglycerides. All values are comparable to prior values  Essential hypertension: mild elevation in SBP Already on losartan and amlodipine.    Medications: Patient's Medications  New Prescriptions   No medications on file  Previous Medications   ACYCLOVIR (ZOVIRAX) 400 MG TABLET    Take one tablet to prevent herpes. Take one 5 times a day to treat active herpes.   AMLODIPINE (NORVASC) 5 MG TABLET    One tablet daily to control BP   FEBUXOSTAT (ULORIC) 40 MG TABLET    One daily to prevent gout   HYDROCORTISONE CREAM 1 %    Apply 1 application topically 2 (two) times daily.   LOSARTAN (COZAAR) 100 MG TABLET    One daily to control BP   TADALAFIL (CIALIS) 20 MG TABLET    Take 1 tablet (20 mg total) by mouth daily as needed for erectile dysfunction.   ZOLPIDEM (AMBIEN) 5 MG TABLET    Take one tablet nightly at bedtime as needed sleep  Modified Medications   No medications on file  Discontinued Medications   AZITHROMYCIN (ZITHROMAX) 250 MG TABLET    2 tablets today and then 1 tablet daily until complete     Review of Systems  Constitutional: Negative for chills, diaphoresis, activity change and appetite change.       Intentional weight loss  HENT: Negative.   Eyes: Negative.   Respiratory: Negative.        Chronically hoarse  Cardiovascular: Negative for chest pain, palpitations and leg swelling.  Gastrointestinal: Negative for nausea, abdominal pain, diarrhea, abdominal  distention and rectal pain.  Endocrine: Negative.   Genitourinary: Negative.        Impotent. Cialis helps.  Musculoskeletal: Negative.   Skin:       Red scaling rash around the mouth  Allergic/Immunologic: Negative.   Neurological: Negative.   Hematological: Negative.   Psychiatric/Behavioral: Negative.     Filed Vitals:   09/14/14 1343  BP: 156/86  Pulse: 83  Temp: 97.7 F (36.5 C)  TempSrc: Oral  Resp: 14  Height: 5\' 9"  (1.753 m)  Weight: 203 lb 3.2 oz (92.171 kg)   Body mass index is 29.99 kg/(m^2).  Physical Exam  Constitutional: He is oriented to person, place, and time. He appears well-developed and well-nourished. No distress.  HENT:  Right Ear: External ear normal.  Left Ear: External ear normal.  Nose: Nose normal.  Mouth/Throat: Oropharynx is clear and moist. No oropharyngeal exudate.  Eyes: Conjunctivae and EOM are normal. Pupils are equal, round, and reactive to light.  Corrective lenses  Cardiovascular: Normal rate, regular rhythm, normal heart sounds and intact distal pulses.  Exam reveals no gallop and no friction rub.   No murmur heard. Pulmonary/Chest: No respiratory distress. He has no wheezes. He has no rales. He exhibits no tenderness.  Chronic hoarseness  Abdominal: He exhibits no distension and no mass. There is no tenderness.  Musculoskeletal: Normal range of motion. He exhibits no edema and no tenderness.  Neurological: He is alert and oriented to person, place, and time. He has normal reflexes. No cranial nerve deficit. Coordination normal.  Skin: No rash noted. No erythema. No pallor.  Psychiatric: His behavior is normal. Thought content normal.     Labs reviewed: Appointment on 09/10/2014  Component Date Value Ref Range Status  . Cholesterol, Total 09/10/2014 232* 100 - 199 mg/dL Final  . Triglycerides 09/10/2014 191* 0 - 149 mg/dL Final  . HDL 09/10/2014 65  >39 mg/dL Final   Comment: According to ATP-III Guidelines, HDL-C >59 mg/dL is  considered a                          negative risk factor for CHD.  Marland Kitchen VLDL Cholesterol Cal 09/10/2014 38  5 - 40 mg/dL Final  . LDL Calculated 09/10/2014 129* 0 - 99 mg/dL Final  . Chol/HDL Ratio 09/10/2014 3.6  0.0 - 5.0 ratio units Final   Comment:                                   T. Chol/HDL Ratio                                                                      Men  Women                                                        1/2 Avg.Risk  3.4    3.3                                                            Avg.Risk  5.0    4.4                                                         2X Avg.Risk  9.6    7.1                                                         3X Avg.Risk 23.4   11.0  . Glucose 09/10/2014 93  65 - 99 mg/dL Final  . BUN 09/10/2014 10  8 - 27 mg/dL Final  . Creatinine, Ser 09/10/2014 0.91  0.76 - 1.27 mg/dL Final  . GFR calc non Af Amer 09/10/2014 88  >59 mL/min/1.73 Final  . GFR calc Af Wyvonnia Lora 09/10/2014  102  >59 mL/min/1.73 Final  . BUN/Creatinine Ratio 09/10/2014 11  10 - 22 Final  . Sodium 09/10/2014 139  134 - 144 mmol/L Final  . Potassium 09/10/2014 4.6  3.5 - 5.2 mmol/L Final  . Chloride 09/10/2014 98  97 - 108 mmol/L Final  . CO2 09/10/2014 21  18 - 29 mmol/L Final  . Calcium 09/10/2014 9.3  8.6 - 10.2 mg/dL Final  . Total Protein 09/10/2014 6.9  6.0 - 8.5 g/dL Final  . Albumin 09/10/2014 4.6  3.6 - 4.8 g/dL Final  . Globulin, Total 09/10/2014 2.3  1.5 - 4.5 g/dL Final  . Albumin/Globulin Ratio 09/10/2014 2.0  1.1 - 2.5 Final  . Total Bilirubin 09/10/2014 0.3  0.0 - 1.2 mg/dL Final  . Alkaline Phosphatase 09/10/2014 93  39 - 117 IU/L Final  . AST 09/10/2014 31  0 - 40 IU/L Final  . ALT 09/10/2014 34  0 - 44 IU/L Final    Assessment/Plan 1. Chronic gout without tophus, unspecified cause, unspecified site Controlled continue current medications  2. Hyperglycemia Controlled by diet  3. Hyperlipidemia Satisfactory control.  4. Essential  hypertension Mild increase in SBP. Check BP weekly. Avoid salt. Lose weight.

## 2014-12-07 ENCOUNTER — Other Ambulatory Visit: Payer: Self-pay | Admitting: *Deleted

## 2014-12-07 ENCOUNTER — Other Ambulatory Visit: Payer: Self-pay

## 2014-12-07 DIAGNOSIS — G4700 Insomnia, unspecified: Secondary | ICD-10-CM

## 2014-12-07 MED ORDER — ZOLPIDEM TARTRATE 5 MG PO TABS: ORAL_TABLET | ORAL | Status: DC

## 2014-12-07 NOTE — Telephone Encounter (Signed)
Patient stopped by the office to have rx called into [facility] at [facility]

## 2015-01-18 ENCOUNTER — Other Ambulatory Visit: Payer: Self-pay | Admitting: *Deleted

## 2015-01-18 DIAGNOSIS — N529 Male erectile dysfunction, unspecified: Secondary | ICD-10-CM

## 2015-01-18 DIAGNOSIS — M1000 Idiopathic gout, unspecified site: Secondary | ICD-10-CM

## 2015-01-18 MED ORDER — FEBUXOSTAT 40 MG PO TABS: ORAL_TABLET | ORAL | Status: DC

## 2015-01-18 MED ORDER — TADALAFIL 20 MG PO TABS: 20.0000 mg | ORAL_TABLET | Freq: Every day | ORAL | Status: DC | PRN

## 2015-01-18 NOTE — Telephone Encounter (Signed)
Patient walked in and requested Rx's to mail to [facility]. Printed and he will pick up

## 2015-01-28 ENCOUNTER — Other Ambulatory Visit: Payer: Self-pay | Admitting: Internal Medicine

## 2015-02-26 ENCOUNTER — Other Ambulatory Visit: Payer: Self-pay | Admitting: Internal Medicine

## 2015-03-10 ENCOUNTER — Other Ambulatory Visit: Payer: Commercial Managed Care - HMO

## 2015-03-10 DIAGNOSIS — M1A9XX0 Chronic gout, unspecified, without tophus (tophi): Secondary | ICD-10-CM | POA: Diagnosis not present

## 2015-03-10 DIAGNOSIS — I10 Essential (primary) hypertension: Secondary | ICD-10-CM | POA: Diagnosis not present

## 2015-03-10 DIAGNOSIS — R739 Hyperglycemia, unspecified: Secondary | ICD-10-CM

## 2015-03-10 DIAGNOSIS — R7309 Other abnormal glucose: Secondary | ICD-10-CM | POA: Diagnosis not present

## 2015-03-10 DIAGNOSIS — E785 Hyperlipidemia, unspecified: Secondary | ICD-10-CM | POA: Diagnosis not present

## 2015-03-11 LAB — BASIC METABOLIC PANEL
BUN/Creatinine Ratio: 14 (ref 10–22)
BUN: 13 mg/dL (ref 8–27)
CO2: 22 mmol/L (ref 18–29)
CREATININE: 0.93 mg/dL (ref 0.76–1.27)
Calcium: 9.4 mg/dL (ref 8.6–10.2)
Chloride: 100 mmol/L (ref 97–108)
GFR calc Af Amer: 99 mL/min/{1.73_m2} (ref >59)
GFR, EST NON AFRICAN AMERICAN: 85 mL/min/{1.73_m2} (ref >59)
GLUCOSE: 94 mg/dL (ref 65–99)
Potassium: 4.7 mmol/L (ref 3.5–5.2)
Sodium: 139 mmol/L (ref 134–144)

## 2015-03-11 LAB — LIPID PANEL
CHOL/HDL RATIO: 4.1 ratio (ref 0.0–5.0)
Cholesterol, Total: 202 mg/dL — ABNORMAL HIGH (ref 100–199)
HDL: 49 mg/dL (ref >39)
LDL Calculated: 123 mg/dL — ABNORMAL HIGH (ref 0–99)
TRIGLYCERIDES: 151 mg/dL — AB (ref 0–149)
VLDL Cholesterol Cal: 30 mg/dL (ref 5–40)

## 2015-03-11 LAB — URIC ACID: Uric Acid: 5.5 mg/dL (ref 3.7–8.6)

## 2015-03-15 ENCOUNTER — Ambulatory Visit (INDEPENDENT_AMBULATORY_CARE_PROVIDER_SITE_OTHER): Payer: Commercial Managed Care - HMO | Admitting: Internal Medicine

## 2015-03-15 ENCOUNTER — Encounter: Payer: Self-pay | Admitting: Internal Medicine

## 2015-03-15 VITALS — BP 132/72 | HR 77 | Temp 97.8°F | Resp 20 | Ht 69.0 in | Wt 209.6 lb

## 2015-03-15 DIAGNOSIS — R739 Hyperglycemia, unspecified: Secondary | ICD-10-CM

## 2015-03-15 DIAGNOSIS — R21 Rash and other nonspecific skin eruption: Secondary | ICD-10-CM | POA: Diagnosis not present

## 2015-03-15 DIAGNOSIS — B351 Tinea unguium: Secondary | ICD-10-CM | POA: Diagnosis not present

## 2015-03-15 DIAGNOSIS — N529 Male erectile dysfunction, unspecified: Secondary | ICD-10-CM | POA: Diagnosis not present

## 2015-03-15 DIAGNOSIS — M1A9XX0 Chronic gout, unspecified, without tophus (tophi): Secondary | ICD-10-CM

## 2015-03-15 DIAGNOSIS — G4700 Insomnia, unspecified: Secondary | ICD-10-CM | POA: Diagnosis not present

## 2015-03-15 DIAGNOSIS — I10 Essential (primary) hypertension: Secondary | ICD-10-CM

## 2015-03-15 DIAGNOSIS — E785 Hyperlipidemia, unspecified: Secondary | ICD-10-CM

## 2015-03-15 DIAGNOSIS — H43391 Other vitreous opacities, right eye: Secondary | ICD-10-CM

## 2015-03-15 DIAGNOSIS — H43399 Other vitreous opacities, unspecified eye: Secondary | ICD-10-CM | POA: Insufficient documentation

## 2015-03-15 MED ORDER — TADALAFIL 20 MG PO TABS: 20.0000 mg | ORAL_TABLET | Freq: Every day | ORAL | Status: DC | PRN

## 2015-03-15 MED ORDER — LOSARTAN POTASSIUM 100 MG PO TABS: ORAL_TABLET | ORAL | Status: DC

## 2015-03-15 MED ORDER — ZOLPIDEM TARTRATE 5 MG PO TABS: ORAL_TABLET | ORAL | Status: DC

## 2015-03-15 MED ORDER — AMLODIPINE BESYLATE 5 MG PO TABS: ORAL_TABLET | ORAL | Status: DC

## 2015-03-15 MED ORDER — HYDROCORTISONE 1 % EX CREA: 1.0000 "application " | TOPICAL_CREAM | Freq: Two times a day (BID) | CUTANEOUS | Status: DC

## 2015-03-15 MED ORDER — MICONAZOLE NITRATE 2 % EX SOLN: CUTANEOUS | Status: DC

## 2015-03-15 NOTE — Progress Notes (Signed)
Patient ID: Rodney Foster, male   DOB: 05-24-1950, 65 y.o.   MRN: [id number]    Facility  [facility]    Place of Service:   OFFICE   No Known Allergies  Chief Complaint  Patient presents with  . Medical Management of Chronic Issues    HPI:  Insomnia- benefits from  zolpidem (AMBIEN) 5 MG tablet  Impotence - benefits from tadalafil (CIALIS) 20 MG tablet  Rash and nonspecific skin eruption - nonspecific dermatitis benefits from hydrocortisone cream 1 %  Essential hypertension - controlled by losartan (COZAAR) 100 MG tablet, amLODipine (NORVASC) 5 MG tablet  Hyperlipidemia - under reasonable control  Hyperglycemia -controlled by diet   Chronic gout without tophus, unspecified cause, unspecified site: No recent attacks and uric acid down to 5.5  Tinea unguium -left great toenail medially  Vitreous floater, right-sided: Has noted spots dancing in front of his eyes. Usually between 10 AM and 4 AM.     Medications: Patient's Medications  New Prescriptions   No medications on file  Previous Medications   ACYCLOVIR (ZOVIRAX) 400 MG TABLET    Take one tablet to prevent herpes. Take one 5 times a day to treat active herpes.   AMLODIPINE (NORVASC) 5 MG TABLET    TAKE 1 TABLET BY MOUTH EVERY DAY TO CONTROL BLOOD PRESSURE   FEBUXOSTAT (ULORIC) 40 MG TABLET    Take One tablet by mouth once daily to prevent gout   HYDROCORTISONE CREAM 1 %    Apply 1 application topically 2 (two) times daily.   LOSARTAN (COZAAR) 100 MG TABLET    TAKE 1 TABLET BY MOUTH EVERY DAY TO CONTROL BLOOD PRESSURE   TADALAFIL (CIALIS) 20 MG TABLET    Take 1 tablet (20 mg total) by mouth daily as needed for erectile dysfunction.   ZOLPIDEM (AMBIEN) 5 MG TABLET    Take 1/2-1 by mouth nightly at bedtime as needed sleep  Modified Medications   No medications on file  Discontinued Medications   No medications on file     Review of Systems  Constitutional: Negative for chills, diaphoresis, activity change and appetite  change.       Intentional weight loss  HENT: Negative.   Eyes:       Spots dancing before his eyes from time to time.  Respiratory: Negative.        Chronically hoarse  Cardiovascular: Negative for chest pain, palpitations and leg swelling.  Gastrointestinal: Negative for nausea, abdominal pain, diarrhea, abdominal distention and rectal pain.  Endocrine: Negative.   Genitourinary: Negative.        Impotent. Cialis helps.  Musculoskeletal: Negative.   Skin:       Red scaling rash around the mouth Tinea unguium left great toenail medially  Allergic/Immunologic: Negative.   Neurological: Negative.   Hematological: Negative.   Psychiatric/Behavioral: Negative.     Filed Vitals:   03/15/15 1331  BP: 132/72  Pulse: 77  Temp: 97.8 F (36.6 C)  TempSrc: Oral  Resp: 20  Height: 5\' 9"  (1.753 m)  Weight: 209 lb 9.6 oz (95.074 kg)  SpO2: 95%   Body mass index is 30.94 kg/(m^2).  Physical Exam  Constitutional: He is oriented to person, place, and time. He appears well-developed and well-nourished. No distress.  HENT:  Right Ear: External ear normal.  Left Ear: External ear normal.  Nose: Nose normal.  Mouth/Throat: Oropharynx is clear and moist. No oropharyngeal exudate.  Eyes: Conjunctivae and EOM are normal. Pupils are equal, round,  and reactive to light.  Corrective lenses. Vitreous floaters right eye.  Cardiovascular: Normal rate, regular rhythm, normal heart sounds and intact distal pulses.  Exam reveals no gallop and no friction rub.   No murmur heard. Pulmonary/Chest: No respiratory distress. He has no wheezes. He has no rales. He exhibits no tenderness.  Chronic hoarseness  Abdominal: He exhibits no distension and no mass. There is no tenderness.  Musculoskeletal: Normal range of motion. He exhibits no edema or tenderness.  Neurological: He is alert and oriented to person, place, and time. He has normal reflexes. No cranial nerve deficit. Coordination normal.  Skin: No  rash noted. No erythema. No pallor.  Tinea unguium left great toenail medially  Psychiatric: His behavior is normal. Thought content normal.     Labs reviewed: Appointment on 03/10/2015  Component Date Value Ref Range Status  . Glucose 03/10/2015 94  65 - 99 mg/dL Final  . BUN 03/10/2015 13  8 - 27 mg/dL Final  . Creatinine, Ser 03/10/2015 0.93  0.76 - 1.27 mg/dL Final  . GFR calc non Af Amer 03/10/2015 85  >59 mL/min/1.73 Final  . GFR calc Af Amer 03/10/2015 99  >59 mL/min/1.73 Final  . BUN/Creatinine Ratio 03/10/2015 14  10 - 22 Final  . Sodium 03/10/2015 139  134 - 144 mmol/L Final  . Potassium 03/10/2015 4.7  3.5 - 5.2 mmol/L Final  . Chloride 03/10/2015 100  97 - 108 mmol/L Final  . CO2 03/10/2015 22  18 - 29 mmol/L Final  . Calcium 03/10/2015 9.4  8.6 - 10.2 mg/dL Final  . Uric Acid 03/10/2015 5.5  3.7 - 8.6 mg/dL Final              Therapeutic target for gout patients: <6.0  . Cholesterol, Total 03/10/2015 202* 100 - 199 mg/dL Final  . Triglycerides 03/10/2015 151* 0 - 149 mg/dL Final  . HDL 03/10/2015 49  >39 mg/dL Final   Comment: According to ATP-III Guidelines, HDL-C >59 mg/dL is considered a negative risk factor for CHD.   Marland Kitchen VLDL Cholesterol Cal 03/10/2015 30  5 - 40 mg/dL Final  . LDL Calculated 03/10/2015 123* 0 - 99 mg/dL Final  . Chol/HDL Ratio 03/10/2015 4.1  0.0 - 5.0 ratio units Final   Comment:                                   T. Chol/HDL Ratio                                             Men  Women                               1/2 Avg.Risk  3.4    3.3                                   Avg.Risk  5.0    4.4                                2X Avg.Risk  9.6    7.1  3X Avg.Risk 23.4   11.0      Assessment/Plan  Insomnia - Plan: zolpidem (AMBIEN) 5 MG tablet  Impotence - Plan: tadalafil (CIALIS) 20 MG tablet  Rash and nonspecific skin eruption - Plan: hydrocortisone cream 1 %  Essential hypertension - Plan: losartan  (COZAAR) 100 MG tablet, amLODipine (NORVASC) 5 MG tablet, Comprehensive metabolic panel  Hyperlipidemia - Plan: Lipid panel  Hyperglycemia - Plan: Hemoglobin A1c, Microalbumin, urine, Comprehensive metabolic panel  Chronic gout without tophus, unspecified cause, unspecified site: No recent attacks and uric acid well controlled.  Tinea unguium - Plan: Miconazole Nitrate 2 % SOLN  Vitreous floater, right: No treatment needed. Patient advised this was new since problem.

## 2015-03-21 ENCOUNTER — Telehealth: Payer: Self-pay | Admitting: *Deleted

## 2015-03-21 NOTE — Telephone Encounter (Signed)
Called left message for patient to return call to office regarding flu vaccine.

## 2015-04-04 ENCOUNTER — Other Ambulatory Visit: Payer: Self-pay | Admitting: Internal Medicine

## 2015-04-26 ENCOUNTER — Telehealth: Payer: Self-pay | Admitting: *Deleted

## 2015-04-26 NOTE — Telephone Encounter (Signed)
Patient called and stated that he is having a gout flare and would like a Rx for Colchicine. Please Advise.

## 2015-04-27 MED ORDER — COLCHICINE 0.6 MG PO TABS: ORAL_TABLET | ORAL | Status: DC

## 2015-04-27 NOTE — Telephone Encounter (Signed)
Send Prescription for colchicine 0.6 mg, dispense 30 tablets, take 1 tablet every 2 hours until gout subsides or diarrhea occurs. Would also recommend taking Aleve 220 mg, available over-the-counter, one tablet every 6 hours.

## 2015-04-27 NOTE — Telephone Encounter (Signed)
Message left on triage voicemail, patient called yesterday about gout and no one has called back yet. Patient would like status update on his message.   Patient aware of Dr.Green's response. RX sent to the pharmacy

## 2015-05-25 DIAGNOSIS — M10071 Idiopathic gout, right ankle and foot: Secondary | ICD-10-CM | POA: Diagnosis not present

## 2015-05-25 DIAGNOSIS — M1990 Unspecified osteoarthritis, unspecified site: Secondary | ICD-10-CM | POA: Diagnosis not present

## 2015-05-25 DIAGNOSIS — M65871 Other synovitis and tenosynovitis, right ankle and foot: Secondary | ICD-10-CM | POA: Diagnosis not present

## 2015-05-25 DIAGNOSIS — M7751 Other enthesopathy of right foot: Secondary | ICD-10-CM | POA: Diagnosis not present

## 2015-05-26 ENCOUNTER — Other Ambulatory Visit: Payer: Self-pay | Admitting: Internal Medicine

## 2015-05-26 DIAGNOSIS — M65871 Other synovitis and tenosynovitis, right ankle and foot: Secondary | ICD-10-CM | POA: Diagnosis not present

## 2015-05-26 DIAGNOSIS — M7751 Other enthesopathy of right foot: Secondary | ICD-10-CM | POA: Diagnosis not present

## 2015-05-27 DIAGNOSIS — M65871 Other synovitis and tenosynovitis, right ankle and foot: Secondary | ICD-10-CM | POA: Diagnosis not present

## 2015-05-27 DIAGNOSIS — M10071 Idiopathic gout, right ankle and foot: Secondary | ICD-10-CM | POA: Diagnosis not present

## 2015-05-31 DIAGNOSIS — M7751 Other enthesopathy of right foot: Secondary | ICD-10-CM | POA: Diagnosis not present

## 2015-05-31 DIAGNOSIS — M10071 Idiopathic gout, right ankle and foot: Secondary | ICD-10-CM | POA: Diagnosis not present

## 2015-06-01 ENCOUNTER — Encounter: Payer: Self-pay | Admitting: Internal Medicine

## 2015-06-01 ENCOUNTER — Ambulatory Visit (INDEPENDENT_AMBULATORY_CARE_PROVIDER_SITE_OTHER): Payer: Commercial Managed Care - HMO | Admitting: Internal Medicine

## 2015-06-01 VITALS — BP 136/78 | HR 73 | Temp 97.4°F | Resp 18 | Ht 69.0 in | Wt 206.4 lb

## 2015-06-01 DIAGNOSIS — G4700 Insomnia, unspecified: Secondary | ICD-10-CM

## 2015-06-01 DIAGNOSIS — M1000 Idiopathic gout, unspecified site: Secondary | ICD-10-CM | POA: Diagnosis not present

## 2015-06-01 DIAGNOSIS — M25579 Pain in unspecified ankle and joints of unspecified foot: Secondary | ICD-10-CM | POA: Insufficient documentation

## 2015-06-01 DIAGNOSIS — I10 Essential (primary) hypertension: Secondary | ICD-10-CM

## 2015-06-01 DIAGNOSIS — M25571 Pain in right ankle and joints of right foot: Secondary | ICD-10-CM | POA: Diagnosis not present

## 2015-06-01 DIAGNOSIS — E785 Hyperlipidemia, unspecified: Secondary | ICD-10-CM | POA: Diagnosis not present

## 2015-06-01 DIAGNOSIS — R739 Hyperglycemia, unspecified: Secondary | ICD-10-CM

## 2015-06-01 MED ORDER — ZOLPIDEM TARTRATE 5 MG PO TABS: ORAL_TABLET | ORAL | Status: DC

## 2015-06-01 MED ORDER — FEBUXOSTAT 40 MG PO TABS: ORAL_TABLET | ORAL | Status: DC

## 2015-06-01 NOTE — Progress Notes (Signed)
Patient ID: Rodney Foster, male   DOB: May 05, 1950, 65 y.o.   MRN: [id number]    Facility  [facility]    Place of Service:   OFFICE    No Known Allergies  Chief Complaint  Patient presents with  . Acute Visit    ? gout, or sprain    HPI:  Idiopathic gout, unspecified chronicity, unspecified site - Plan: febuxostat (ULORIC) 40 MG tablet  Essential hypertension: Controlled  Hyperlipidemia: Adequately controlled  Hyperglycemia: Last lab showed glucose 94 mg percent  Pain in joint, ankle and foot, right: Has been ongoing for months. He initially thought he sprained an ankle walking, walks daily. Pain to ankle and top of foot. Took a few colchicine a few weeks ago with no relief, also took Indomethacin for a week with no relief. Was gradually worsening, until went to Dr. [facility] 3 weeks ago where he received xrays, an ultrasound, and a series of 6 injections. He is unsure if they were Steroid injections. Today feels better than I have in weeks, resolution x 2 days now. Some pain still, points to top of foot.  Has been taking 440 mg of Aleve each morning with moderate relief. Continues to take Uloric, wonders if he would benefit from increasing his dose.  Insomnia: Requests refill of Ambien. Plan: zolpidem (AMBIEN) 5 MG tablet    Medications: Patient's Medications  New Prescriptions   No medications on file  Previous Medications   ACYCLOVIR (ZOVIRAX) 400 MG TABLET    TAKE 1 TO PREVENT HERPES AND TAKE 5 TABLETS PER DAY FOR ACTIVE HERPES   AMLODIPINE (NORVASC) 5 MG TABLET    TAKE 1 TABLET BY MOUTH EVERY DAY TO CONTROL BLOOD PRESSURE   COLCHICINE 0.6 MG TABLET    take 1 tablet every 2 hours until gout subsides or diarrhea occurs   HYDROCORTISONE CREAM 1 %    Apply 1 application topically 2 (two) times daily.   LOSARTAN (COZAAR) 100 MG TABLET    TAKE 1 TABLET BY MOUTH EVERY DAY TO CONTROL BLOOD PRESSURE   MICONAZOLE NITRATE 2 % SOLN    Apply daily to affected toenail   TADALAFIL  (CIALIS) 20 MG TABLET    Take 1 tablet (20 mg total) by mouth daily as needed for erectile dysfunction.  Modified Medications   Modified Medication Previous Medication   FEBUXOSTAT (ULORIC) 40 MG TABLET febuxostat (ULORIC) 40 MG tablet      Take One tablet by mouth once daily to prevent gout    Take One tablet by mouth once daily to prevent gout   ZOLPIDEM (AMBIEN) 5 MG TABLET zolpidem (AMBIEN) 5 MG tablet      TAKE ONE-HALF TO ONE TABLET AT BEDTIME AS NEEDED FOR SLEEP    TAKE ONE-HALF TO ONE TABLET AT BEDTIME AS NEEDED FOR SLEEP  Discontinued Medications   No medications on file     Review of Systems  Constitutional: Negative for chills, diaphoresis, activity change and appetite change.       Intentional weight loss  HENT: Negative.   Eyes:       Spots dancing before his eyes from time to time.  Respiratory: Negative.        Chronically hoarse  Cardiovascular: Negative for chest pain, palpitations and leg swelling.  Gastrointestinal: Negative for nausea, abdominal pain, diarrhea, abdominal distention and rectal pain.  Endocrine: Negative.   Genitourinary: Negative.        Impotent. Cialis helps.  Musculoskeletal: Positive for arthralgias (  right ankle and foot).  Skin:       Red scaling rash around the mouth Tinea unguium left great toenail medially  Allergic/Immunologic: Negative.   Neurological: Negative.   Hematological: Negative.   Psychiatric/Behavioral: Negative.     Filed Vitals:   06/01/15 1247  BP: 136/78  Pulse: 73  Temp: 97.4 F (36.3 C)  TempSrc: Oral  Resp: 18  Height: 5\' 9"  (1.753 m)  Weight: 206 lb 6.4 oz (93.622 kg)  SpO2: 94%   Body mass index is 30.47 kg/(m^2).  Physical Exam  Constitutional: He is oriented to person, place, and time. He appears well-developed and well-nourished. No distress.  HENT:  Right Ear: External ear normal.  Left Ear: External ear normal.  Nose: Nose normal.  Mouth/Throat: Oropharynx is clear and moist. No oropharyngeal  exudate.  Eyes: Conjunctivae and EOM are normal. Pupils are equal, round, and reactive to light.  Corrective lenses  Cardiovascular: Normal rate, regular rhythm, normal heart sounds and intact distal pulses.  Exam reveals no gallop and no friction rub.   No murmur heard. Pulmonary/Chest: No respiratory distress. He has no wheezes. He has no rales. He exhibits no tenderness.  Chronic hoarseness  Abdominal: He exhibits no distension and no mass. There is no tenderness.  Musculoskeletal: Normal range of motion. He exhibits no edema or tenderness.       Right ankle: He exhibits normal range of motion, no swelling and no ecchymosis. No tenderness.       Right foot: There is normal range of motion, no tenderness, no bony tenderness, no swelling and normal capillary refill.  Neurological: He is alert and oriented to person, place, and time. He has normal reflexes. No cranial nerve deficit. Coordination normal.  Skin: No rash noted. No erythema. No pallor.  Tinea unguium left great toenail medially  Psychiatric: His behavior is normal. Thought content normal.     Labs reviewed: Appointment on 03/10/2015  Component Date Value Ref Range Status  . Glucose 03/10/2015 94  65 - 99 mg/dL Final  . BUN 03/10/2015 13  8 - 27 mg/dL Final  . Creatinine, Ser 03/10/2015 0.93  0.76 - 1.27 mg/dL Final  . GFR calc non Af Amer 03/10/2015 85  >59 mL/min/1.73 Final  . GFR calc Af Amer 03/10/2015 99  >59 mL/min/1.73 Final  . BUN/Creatinine Ratio 03/10/2015 14  10 - 22 Final  . Sodium 03/10/2015 139  134 - 144 mmol/L Final  . Potassium 03/10/2015 4.7  3.5 - 5.2 mmol/L Final  . Chloride 03/10/2015 100  97 - 108 mmol/L Final  . CO2 03/10/2015 22  18 - 29 mmol/L Final  . Calcium 03/10/2015 9.4  8.6 - 10.2 mg/dL Final  . Uric Acid 03/10/2015 5.5  3.7 - 8.6 mg/dL Final              Therapeutic target for gout patients: <6.0  . Cholesterol, Total 03/10/2015 202* 100 - 199 mg/dL Final  . Triglycerides 03/10/2015 151*  0 - 149 mg/dL Final  . HDL 03/10/2015 49  >39 mg/dL Final   Comment: According to ATP-III Guidelines, HDL-C >59 mg/dL is considered a negative risk factor for CHD.   Marland Kitchen VLDL Cholesterol Cal 03/10/2015 30  5 - 40 mg/dL Final  . LDL Calculated 03/10/2015 123* 0 - 99 mg/dL Final  . Chol/HDL Ratio 03/10/2015 4.1  0.0 - 5.0 ratio units Final   Comment:  T. Chol/HDL Ratio                                             Men  Women                               1/2 Avg.Risk  3.4    3.3                                   Avg.Risk  5.0    4.4                                2X Avg.Risk  9.6    7.1                                3X Avg.Risk 23.4   11.0      Assessment/Plan   1. Idiopathic gout, unspecified chronicity, unspecified site Controlled, Uric Acid level 4.4 on 05/26/15 - febuxostat (ULORIC) 40 MG tablet; Take One tablet by mouth once daily to prevent gout  Dispense: 90 tablet; Refill: 3  2. Essential hypertension Well controlled  3. Hyperlipidemia Follow up labs 8/16  4. Hyperglycemia Follow up labs 8/16  5. Pain in joint, ankle and foot, right Resolving, continue Aleve daily.  6. Insomnia - zolpidem (AMBIEN) 5 MG tablet; TAKE ONE-HALF TO ONE TABLET AT BEDTIME AS NEEDED FOR SLEEP  Dispense: 90 tablet; Refill: 0

## 2015-07-26 ENCOUNTER — Other Ambulatory Visit: Payer: Self-pay | Admitting: Internal Medicine

## 2015-08-22 ENCOUNTER — Other Ambulatory Visit: Payer: Commercial Managed Care - HMO

## 2015-08-22 ENCOUNTER — Other Ambulatory Visit: Payer: Self-pay | Admitting: Internal Medicine

## 2015-08-22 ENCOUNTER — Telehealth: Payer: Self-pay | Admitting: *Deleted

## 2015-08-22 DIAGNOSIS — R300 Dysuria: Secondary | ICD-10-CM | POA: Diagnosis not present

## 2015-08-22 NOTE — Progress Notes (Signed)
Pt having penile burning.  Walked into office.  No rash, drainage or redness, but his girlfriend was c/o a yeast infection.

## 2015-08-22 NOTE — Telephone Encounter (Signed)
Patient walked into the office complaining about Penis Burning. Brought him back into the room and asked if there were any abnormalities. None. No redness, No Drainage, No rash, No burning with urination. Stated that he had been with the same girl for over 7 years and they broke up and now has new girlfriend and they have been having sex and had sex this weekend Friday and Saturday and she told him she had a yeast infection and yesterday on the way back home his penis started burning like on fire.  I spoke with Dr. Mariea Clonts and she stated to get a culture, GC and HIV. Dr. Mariea Clonts placed orders.  I explained to patient and he agreed. Patient cell #: [phone]

## 2015-08-23 LAB — URINE CULTURE

## 2015-08-23 LAB — HIV ANTIBODY (ROUTINE TESTING W REFLEX): HIV Screen 4th Generation wRfx: NONREACTIVE

## 2015-08-24 LAB — GC/CHLAMYDIA PROBE AMP
Chlamydia trachomatis, NAA: NEGATIVE
Neisseria gonorrhoeae by PCR: NEGATIVE

## 2015-08-25 ENCOUNTER — Other Ambulatory Visit: Payer: Self-pay | Admitting: Internal Medicine

## 2015-09-12 ENCOUNTER — Other Ambulatory Visit: Payer: Commercial Managed Care - HMO

## 2015-09-12 DIAGNOSIS — E785 Hyperlipidemia, unspecified: Secondary | ICD-10-CM | POA: Diagnosis not present

## 2015-09-12 DIAGNOSIS — R739 Hyperglycemia, unspecified: Secondary | ICD-10-CM | POA: Diagnosis not present

## 2015-09-12 DIAGNOSIS — I10 Essential (primary) hypertension: Secondary | ICD-10-CM | POA: Diagnosis not present

## 2015-09-13 LAB — COMPREHENSIVE METABOLIC PANEL
A/G RATIO: 1.7 (ref 1.1–2.5)
ALBUMIN: 4 g/dL (ref 3.6–4.8)
ALK PHOS: 78 IU/L (ref 39–117)
ALT: 30 IU/L (ref 0–44)
AST: 28 IU/L (ref 0–40)
BILIRUBIN TOTAL: 0.4 mg/dL (ref 0.0–1.2)
BUN / CREAT RATIO: 13 (ref 10–22)
BUN: 10 mg/dL (ref 8–27)
CHLORIDE: 104 mmol/L (ref 97–108)
CO2: 22 mmol/L (ref 18–29)
Calcium: 9 mg/dL (ref 8.6–10.2)
Creatinine, Ser: 0.77 mg/dL (ref 0.76–1.27)
GFR calc non Af Amer: 95 mL/min/{1.73_m2} (ref >59)
GFR, EST AFRICAN AMERICAN: 109 mL/min/{1.73_m2} (ref >59)
GLOBULIN, TOTAL: 2.3 g/dL (ref 1.5–4.5)
GLUCOSE: 84 mg/dL (ref 65–99)
POTASSIUM: 4.6 mmol/L (ref 3.5–5.2)
SODIUM: 141 mmol/L (ref 134–144)
TOTAL PROTEIN: 6.3 g/dL (ref 6.0–8.5)

## 2015-09-13 LAB — LIPID PANEL
CHOLESTEROL TOTAL: 199 mg/dL (ref 100–199)
Chol/HDL Ratio: 3.2 ratio units (ref 0.0–5.0)
HDL: 62 mg/dL (ref >39)
LDL Calculated: 103 mg/dL — ABNORMAL HIGH (ref 0–99)
Triglycerides: 171 mg/dL — ABNORMAL HIGH (ref 0–149)
VLDL CHOLESTEROL CAL: 34 mg/dL (ref 5–40)

## 2015-09-13 LAB — HEMOGLOBIN A1C
Est. average glucose Bld gHb Est-mCnc: 111 mg/dL
Hgb A1c MFr Bld: 5.5 % (ref 4.8–5.6)

## 2015-09-13 LAB — MICROALBUMIN, URINE

## 2015-09-14 ENCOUNTER — Encounter: Payer: Self-pay | Admitting: Internal Medicine

## 2015-09-14 ENCOUNTER — Ambulatory Visit (INDEPENDENT_AMBULATORY_CARE_PROVIDER_SITE_OTHER): Payer: Commercial Managed Care - HMO | Admitting: Internal Medicine

## 2015-09-14 VITALS — BP 134/82 | HR 98 | Temp 97.6°F | Ht 69.0 in | Wt 202.0 lb

## 2015-09-14 DIAGNOSIS — R739 Hyperglycemia, unspecified: Secondary | ICD-10-CM | POA: Diagnosis not present

## 2015-09-14 DIAGNOSIS — M1A9XX0 Chronic gout, unspecified, without tophus (tophi): Secondary | ICD-10-CM

## 2015-09-14 DIAGNOSIS — E785 Hyperlipidemia, unspecified: Secondary | ICD-10-CM

## 2015-09-14 DIAGNOSIS — I10 Essential (primary) hypertension: Secondary | ICD-10-CM | POA: Diagnosis not present

## 2015-09-14 DIAGNOSIS — B009 Herpesviral infection, unspecified: Secondary | ICD-10-CM

## 2015-09-14 NOTE — Progress Notes (Signed)
Patient ID: Rodney Foster, male   DOB: 1950/11/17, 65 y.o.   MRN: [id number]    Facility  [facility]    Place of Service:   OFFICE    No Known Allergies  Chief Complaint  Patient presents with  . Medical Management of Chronic Issues    6 month follow-up, discuss labs (copy printed)  . Immunizations    Will wait for flu vaccine     HPI:  Essential hypertension - controlled on current medication  Hyperlipidemia - controlled on current medication   Hyperglycemia - controlled with diet alone  Chronic gout without tophus, unspecified cause, unspecified site - no recent attacks  Herpes simplex -only breaks out in his lips now. Has had previous attacks in general area but it has been well over 5 or 6 years since his last outbreak there. Uses acyclovir. Takes 5 tablets if he feels any tingling or burning on his lip. This generally seems to prevent an outbreak.    Medications: Patient's Medications  New Prescriptions   No medications on file  Previous Medications   ACYCLOVIR (ZOVIRAX) 400 MG TABLET    TAKE 1 TO PREVENT HERPES AND TAKE 5 TABLETS PER DAY FOR ACTIVE HERPES   AMLODIPINE (NORVASC) 5 MG TABLET    TAKE 1 TABLET BY MOUTH EVERY DAY TO CONTROL BLOOD PRESSURE   COLCHICINE 0.6 MG TABLET    take 1 tablet every 2 hours until gout subsides or diarrhea occurs   FEBUXOSTAT (ULORIC) 40 MG TABLET    Take One tablet by mouth once daily to prevent gout   LOSARTAN (COZAAR) 100 MG TABLET    TAKE 1 TABLET BY MOUTH EVERY DAY TO CONTROL BLOOD PRESSURE   TADALAFIL (CIALIS) 20 MG TABLET    Take 1 tablet (20 mg total) by mouth daily as needed for erectile dysfunction.   ZOLPIDEM (AMBIEN) 5 MG TABLET    TAKE ONE-HALF TO 1 TABLET AT BEDTIME AS NEEDED FOR SLEEP  Modified Medications   No medications on file  Discontinued Medications   AMLODIPINE (NORVASC) 5 MG TABLET    TAKE 1 TABLET BY MOUTH EVERY DAY TO CONTROL BLOOD PRESSURE   HYDROCORTISONE CREAM 1 %    Apply 1 application topically 2 (two) times  daily.   MICONAZOLE NITRATE 2 % SOLN    Apply daily to affected toenail     Review of Systems  Constitutional: Negative for chills, diaphoresis, activity change and appetite change.       Intentional weight loss  HENT: Negative.   Eyes:       Spots dancing before his eyes from time to time.  Respiratory: Negative.        Chronically hoarse  Cardiovascular: Negative for chest pain, palpitations and leg swelling.  Gastrointestinal: Negative for nausea, abdominal pain, diarrhea, abdominal distention and rectal pain.  Endocrine: Negative.   Genitourinary: Negative.        Impotent. Cialis helps.  Musculoskeletal: Positive for arthralgias (right ankle and foot).  Skin:       Tinea unguium left great toenail medially  Allergic/Immunologic: Negative.   Neurological: Negative.   Hematological: Negative.   Psychiatric/Behavioral: Negative.     Filed Vitals:   09/14/15 1140  BP: 134/82  Pulse: 98  Temp: 97.6 F (36.4 C)  TempSrc: Oral  Height: '5\' 9"'  (1.753 m)  Weight: 202 lb (91.627 kg)  SpO2: 94%   Body mass index is 29.82 kg/(m^2).  Physical Exam  Constitutional: He is oriented to person, place,  and time. He appears well-developed and well-nourished. No distress.  HENT:  Right Ear: External ear normal.  Left Ear: External ear normal.  Nose: Nose normal.  Mouth/Throat: Oropharynx is clear and moist. No oropharyngeal exudate.  Eyes: Conjunctivae and EOM are normal. Pupils are equal, round, and reactive to light.  Corrective lenses  Cardiovascular: Normal rate, regular rhythm, normal heart sounds and intact distal pulses.  Exam reveals no gallop and no friction rub.   No murmur heard. Pulmonary/Chest: No respiratory distress. He has no wheezes. He has no rales. He exhibits no tenderness.  Chronic hoarseness  Abdominal: He exhibits no distension and no mass. There is no tenderness.  Musculoskeletal: Normal range of motion. He exhibits no edema or tenderness.       Right  ankle: He exhibits normal range of motion, no swelling and no ecchymosis. No tenderness.       Right foot: There is normal range of motion, no tenderness, no bony tenderness, no swelling and normal capillary refill.  Neurological: He is alert and oriented to person, place, and time. He has normal reflexes. No cranial nerve deficit. Coordination normal.  Skin: No rash noted. No erythema. No pallor.  Tinea unguium left great toenail medially  Psychiatric: His behavior is normal. Thought content normal.     Labs reviewed: Lab Summary Latest Ref Rng 09/12/2015 03/10/2015 09/10/2014  Hemoglobin - (None) (None) (None)  Hematocrit - (None) (None) (None)  White count - (None) (None) (None)  Platelet count - (None) (None) (None)  Sodium 134 - 144 mmol/L 141 139 139  Potassium 3.5 - 5.2 mmol/L 4.6 4.7 4.6  Calcium 8.6 - 10.2 mg/dL 9.0 9.4 9.3  Phosphorus - (None) (None) (None)  Creatinine 0.76 - 1.27 mg/dL 0.77 0.93 0.91  AST 0 - 40 IU/L 28 (None) 31  Alk Phos 39 - 117 IU/L 78 (None) 93  Bilirubin 0.0 - 1.2 mg/dL 0.4 (None) 0.3  Glucose 65 - 99 mg/dL 84 94 93  Cholesterol - (None) (None) (None)  HDL cholesterol >39 mg/dL 62 49 65  Triglycerides 0 - 149 mg/dL 171(H) 151(H) 191(H)  LDL Direct - (None) (None) (None)  LDL Calc 0 - 99 mg/dL 103(H) 123(H) 129(H)  Total protein - (None) (None) (None)  Albumin 3.6 - 4.8 g/dL 4.0 (None) 4.6   No results found for: TSH, T3TOTAL, T4TOTAL, THYROIDAB Lab Results  Component Value Date   BUN 10 09/12/2015   Lab Results  Component Value Date   HGBA1C 5.5 09/12/2015       Assessment/Plan  1. Essential hypertension Continue current medication losartan and amlodipine-- - Basic metabolic panel; Future  2. Hyperlipidemia Not currently on any vacations. Avoid saturated fats. - Lipid panel; Future  3. Hyperglycemia Avoid concentrated sugars - Basic metabolic panel; Future  4. Chronic gout without tophus, unspecified cause, unspecified  site Continue current medication - Uric acid; Future  5. Herpes simplex Continue acyclovir

## 2015-09-26 ENCOUNTER — Other Ambulatory Visit: Payer: Self-pay | Admitting: Internal Medicine

## 2015-10-10 DIAGNOSIS — L980 Pyogenic granuloma: Secondary | ICD-10-CM | POA: Diagnosis not present

## 2015-10-10 DIAGNOSIS — L820 Inflamed seborrheic keratosis: Secondary | ICD-10-CM | POA: Diagnosis not present

## 2015-10-24 ENCOUNTER — Other Ambulatory Visit: Payer: Self-pay

## 2015-10-24 ENCOUNTER — Other Ambulatory Visit: Payer: Self-pay | Admitting: Internal Medicine

## 2015-10-24 MED ORDER — AMLODIPINE BESYLATE 5 MG PO TABS
ORAL_TABLET | ORAL | Status: DC
Start: 2015-10-24 — End: 2016-07-13

## 2015-10-24 NOTE — Telephone Encounter (Signed)
Prescription came in today for refill on amlodipine checked patients chart saw that he had 1 refill left on prescription, so I  refused it and said it was to soon for refill. Patient came in to office because pharmacy refused to fill his prescription said he had no  more refills. Sent him another refill to pharmacy will call and see why they would not fill script. Tried to call store keep getting a busy signal.

## 2015-11-23 ENCOUNTER — Ambulatory Visit (INDEPENDENT_AMBULATORY_CARE_PROVIDER_SITE_OTHER): Payer: Commercial Managed Care - HMO | Admitting: Internal Medicine

## 2015-11-23 ENCOUNTER — Encounter: Payer: Self-pay | Admitting: Internal Medicine

## 2015-11-23 VITALS — BP 120/75 | HR 75 | Temp 97.8°F | Resp 20 | Ht 69.0 in | Wt 212.2 lb

## 2015-11-23 DIAGNOSIS — M1000 Idiopathic gout, unspecified site: Secondary | ICD-10-CM

## 2015-11-23 DIAGNOSIS — R21 Rash and other nonspecific skin eruption: Secondary | ICD-10-CM | POA: Diagnosis not present

## 2015-11-23 DIAGNOSIS — N529 Male erectile dysfunction, unspecified: Secondary | ICD-10-CM

## 2015-11-23 MED ORDER — ZOLPIDEM TARTRATE 5 MG PO TABS: ORAL_TABLET | ORAL | Status: DC

## 2015-11-23 MED ORDER — TADALAFIL 20 MG PO TABS: 20.0000 mg | ORAL_TABLET | Freq: Every day | ORAL | Status: DC | PRN

## 2015-11-23 MED ORDER — FEBUXOSTAT 40 MG PO TABS: ORAL_TABLET | ORAL | Status: DC

## 2015-11-23 NOTE — Progress Notes (Signed)
Patient ID: Rodney Foster, male   DOB: 04-Nov-1950, 65 y.o.   MRN: [id number]    Facility  [facility]    Place of Service:   OFFICE    No Known Allergies  Chief Complaint  Patient presents with  . Medical Management of Chronic Issues    burning and irratation in the gentital area    HPI:  Patient has new girlfriend. Following sex, he had some burning in the scrotal area. Although he has a previous history of herpes genitalis, he did not break out any blisters. The burning discomfort seemed quite diffuse across the scrotal area. There is been no adenopathy. No penile drip. Denies fever or pain with intercourse. He denies any rough sex.  Medications: Patient's Medications  New Prescriptions   No medications on file  Previous Medications   ACYCLOVIR (ZOVIRAX) 400 MG TABLET    TAKE 1 TO PREVENT HERPES AND TAKE 5 TABLETS PER DAY FOR ACTIVE HERPES   AMLODIPINE (NORVASC) 5 MG TABLET    TAKE 1 TABLET BY MOUTH EVERY DAY TO CONTROL BLOOD PRESSURE   COLCHICINE 0.6 MG TABLET    take 1 tablet every 2 hours until gout subsides or diarrhea occurs   LOSARTAN (COZAAR) 100 MG TABLET    TAKE 1 TABLET BY MOUTH EVERY DAY TO CONTROL BLOOD PRESSURE  Modified Medications   Modified Medication Previous Medication   FEBUXOSTAT (ULORIC) 40 MG TABLET febuxostat (ULORIC) 40 MG tablet      Take One tablet by mouth once daily to prevent gout    Take One tablet by mouth once daily to prevent gout   TADALAFIL (CIALIS) 20 MG TABLET tadalafil (CIALIS) 20 MG tablet      Take 1 tablet (20 mg total) by mouth daily as needed for erectile dysfunction.    Take 1 tablet (20 mg total) by mouth daily as needed for erectile dysfunction.   ZOLPIDEM (AMBIEN) 5 MG TABLET zolpidem (AMBIEN) 5 MG tablet      TAKE ONE-HALF TO 1 TABLET AT BEDTIME AS NEEDED FOR SLEEP    TAKE ONE-HALF TO 1 TABLET AT BEDTIME AS NEEDED FOR SLEEP  Discontinued Medications   LOSARTAN (COZAAR) 100 MG TABLET    TAKE 1 TABLET BY MOUTH EVERY DAY TO CONTROL BLOOD  PRESSURE    Review of Systems  Constitutional: Negative for chills, diaphoresis, activity change and appetite change.       Intentional weight loss  HENT: Negative.   Eyes:       Spots dancing before his eyes from time to time.  Respiratory: Negative.        Chronically hoarse  Cardiovascular: Negative for chest pain, palpitations and leg swelling.  Gastrointestinal: Negative for nausea, abdominal pain, diarrhea, abdominal distention and rectal pain.  Endocrine: Negative.   Genitourinary: Negative.        Impotent. Cialis helps. Burning across scrotal area.  Musculoskeletal: Positive for arthralgias (right ankle and foot).  Skin:       Tinea unguium left great toenail medially  Allergic/Immunologic: Negative.   Neurological: Negative.   Hematological: Negative.   Psychiatric/Behavioral: Negative.     Filed Vitals:   11/23/15 1144  BP: 120/75  Pulse: 75  Temp: 97.8 F (36.6 C)  TempSrc: Oral  Resp: 20  Height: '5\' 9"'  (1.753 m)  Weight: 212 lb 3.2 oz (96.253 kg)  SpO2: 97%   Body mass index is 31.32 kg/(m^2).  Physical Exam  Constitutional: He is oriented to person, place, and time. He  appears well-developed and well-nourished. No distress.  HENT:  Right Ear: External ear normal.  Left Ear: External ear normal.  Nose: Nose normal.  Mouth/Throat: Oropharynx is clear and moist. No oropharyngeal exudate.  Eyes: Conjunctivae and EOM are normal. Pupils are equal, round, and reactive to light.  Corrective lenses  Cardiovascular: Normal rate, regular rhythm, normal heart sounds and intact distal pulses.  Exam reveals no gallop and no friction rub.   No murmur heard. Pulmonary/Chest: No respiratory distress. He has no wheezes. He has no rales. He exhibits no tenderness.  Chronic hoarseness  Abdominal: He exhibits no distension and no mass. There is no tenderness.  Genitourinary:  Mild erythema bilaterally and scrotal area. No rash. No evidence of folliculitis. No  adenopathy.  Musculoskeletal: Normal range of motion. He exhibits no edema or tenderness.       Right ankle: He exhibits normal range of motion, no swelling and no ecchymosis. No tenderness.       Right foot: There is normal range of motion, no tenderness, no bony tenderness, no swelling and normal capillary refill.  Neurological: He is alert and oriented to person, place, and time. He has normal reflexes. No cranial nerve deficit. Coordination normal.  Skin: No rash noted. No erythema. No pallor.  Tinea unguium left great toenail medially  Psychiatric: His behavior is normal. Thought content normal.    Labs reviewed: Lab Summary Latest Ref Rng 09/12/2015 03/10/2015 09/10/2014  Hemoglobin - (None) (None) (None)  Hematocrit - (None) (None) (None)  White count - (None) (None) (None)  Platelet count - (None) (None) (None)  Sodium 134 - 144 mmol/L 141 139 139  Potassium 3.5 - 5.2 mmol/L 4.6 4.7 4.6  Calcium 8.6 - 10.2 mg/dL 9.0 9.4 9.3  Phosphorus - (None) (None) (None)  Creatinine 0.76 - 1.27 mg/dL 0.77 0.93 0.91  AST 0 - 40 IU/L 28 (None) 31  Alk Phos 39 - 117 IU/L 78 (None) 93  Bilirubin 0.0 - 1.2 mg/dL 0.4 (None) 0.3  Glucose 65 - 99 mg/dL 84 94 93  Cholesterol - (None) (None) (None)  HDL cholesterol >39 mg/dL 62 49 65  Triglycerides 0 - 149 mg/dL 171(H) 151(H) 191(H)  LDL Direct - (None) (None) (None)  LDL Calc 0 - 99 mg/dL 103(H) 123(H) 129(H)  Total protein - (None) (None) (None)  Albumin 3.6 - 4.8 g/dL 4.0 (None) 4.6   No results found for: TSH, T3TOTAL, T4TOTAL, THYROIDAB Lab Results  Component Value Date   BUN 10 09/12/2015   Lab Results  Component Value Date   HGBA1C 5.5 09/12/2015    Assessment/Plan  Rash and nonspecific skin eruption Nonspecific irritation across the scrotal area. No evidence of herpes genitalis or other significant infection. -Use 1% hydrocortisone cream daily until symptoms resolve.  Idiopathic gout, unspecified chronicity, unspecified site -  Plan: febuxostat (ULORIC) 40 MG tablet  Impotence - Plan: tadalafil (CIALIS) 20 MG tablet

## 2015-12-16 ENCOUNTER — Other Ambulatory Visit: Payer: Self-pay | Admitting: *Deleted

## 2015-12-16 MED ORDER — ZOLPIDEM TARTRATE 10 MG PO TABS: 10.0000 mg | ORAL_TABLET | Freq: Every evening | ORAL | Status: DC | PRN

## 2015-12-16 NOTE — Telephone Encounter (Signed)
Patient walked in and stated that he was out of his Ambien. Stated he takes Ambien 5mg  1-1 1/2 nightly and he has ran out early and pharmacy will not refill it. He has none left and its the weekend. I called pharmacy and spoke with pharmacist and he stated LF was 11/23/15 #30.  I called Dr. Nyoka Cowden and he changed the Rx to Ambien 10mg  one nightly. Rx called to pharmacy

## 2015-12-21 ENCOUNTER — Telehealth: Payer: Self-pay | Admitting: *Deleted

## 2015-12-21 NOTE — Telephone Encounter (Signed)
[facility] [phone] and spoke with Toneyicann for Tier Reduction of Cialis. Submitted and will know something in 48-72 hours. Patient notified and agreed. Member ID [id number] Dx [id number]

## 2015-12-22 NOTE — Telephone Encounter (Signed)
Received fax from [facility] and they have DENIED tier Reduction for Cialis. Patient notified.

## 2016-01-04 DIAGNOSIS — L821 Other seborrheic keratosis: Secondary | ICD-10-CM | POA: Diagnosis not present

## 2016-01-04 DIAGNOSIS — L820 Inflamed seborrheic keratosis: Secondary | ICD-10-CM | POA: Diagnosis not present

## 2016-01-04 DIAGNOSIS — L814 Other melanin hyperpigmentation: Secondary | ICD-10-CM | POA: Diagnosis not present

## 2016-01-04 DIAGNOSIS — L570 Actinic keratosis: Secondary | ICD-10-CM | POA: Diagnosis not present

## 2016-01-11 ENCOUNTER — Other Ambulatory Visit: Payer: Self-pay | Admitting: Internal Medicine

## 2016-01-13 ENCOUNTER — Other Ambulatory Visit: Payer: Self-pay | Admitting: Internal Medicine

## 2016-01-13 NOTE — Telephone Encounter (Signed)
Patient walked into office and requested refill. Called and spoke with Truddie Crumble, pharmacist at [facility] and gave approval

## 2016-02-09 ENCOUNTER — Ambulatory Visit: Payer: Commercial Managed Care - HMO | Admitting: Nurse Practitioner

## 2016-02-09 ENCOUNTER — Emergency Department (HOSPITAL_COMMUNITY): Payer: Commercial Managed Care - HMO

## 2016-02-09 ENCOUNTER — Encounter (HOSPITAL_COMMUNITY): Payer: Self-pay | Admitting: Radiology

## 2016-02-09 ENCOUNTER — Emergency Department (HOSPITAL_COMMUNITY)
Admission: EM | Admit: 2016-02-09 | Discharge: 2016-02-09 | Disposition: A | Discharge status: Home / Self Care | Payer: Commercial Managed Care - HMO | Attending: Emergency Medicine | Admitting: Emergency Medicine

## 2016-02-09 DIAGNOSIS — Z79899 Other long term (current) drug therapy: Secondary | ICD-10-CM | POA: Insufficient documentation

## 2016-02-09 DIAGNOSIS — Z87891 Personal history of nicotine dependence: Secondary | ICD-10-CM | POA: Insufficient documentation

## 2016-02-09 DIAGNOSIS — H9190 Unspecified hearing loss, unspecified ear: Secondary | ICD-10-CM | POA: Diagnosis not present

## 2016-02-09 DIAGNOSIS — I10 Essential (primary) hypertension: Secondary | ICD-10-CM | POA: Diagnosis not present

## 2016-02-09 DIAGNOSIS — R079 Chest pain, unspecified: Secondary | ICD-10-CM | POA: Diagnosis not present

## 2016-02-09 DIAGNOSIS — Z8619 Personal history of other infectious and parasitic diseases: Secondary | ICD-10-CM | POA: Insufficient documentation

## 2016-02-09 LAB — CBC
HEMATOCRIT: 47.7 % (ref 39.0–52.0)
HEMOGLOBIN: 16.8 g/dL (ref 13.0–17.0)
MCH: 33.3 pg (ref 26.0–34.0)
MCHC: 35.2 g/dL (ref 30.0–36.0)
MCV: 94.6 fL (ref 78.0–100.0)
Platelets: 263 10*3/uL (ref 150–400)
RBC: 5.04 MIL/uL (ref 4.22–5.81)
RDW: 12.4 % (ref 11.5–15.5)
WBC: 9.2 10*3/uL (ref 4.0–10.5)

## 2016-02-09 LAB — BASIC METABOLIC PANEL
ANION GAP: 11 (ref 5–15)
BUN: 11 mg/dL (ref 6–20)
CHLORIDE: 106 mmol/L (ref 101–111)
CO2: 24 mmol/L (ref 22–32)
Calcium: 9.6 mg/dL (ref 8.9–10.3)
Creatinine, Ser: 1.13 mg/dL (ref 0.61–1.24)
GFR calc non Af Amer: >60 mL/min (ref >60)
Glucose, Bld: 130 mg/dL — ABNORMAL HIGH (ref 65–99)
POTASSIUM: 4.2 mmol/L (ref 3.5–5.1)
SODIUM: 141 mmol/L (ref 135–145)

## 2016-02-09 LAB — TROPONIN I

## 2016-02-09 MED ORDER — IOHEXOL 350 MG/ML SOLN
100.0000 mL | Freq: Once | INTRAVENOUS | Status: AC | PRN
  Administered 2016-02-09: 100 mL via INTRAVENOUS

## 2016-02-09 MED ORDER — IBUPROFEN 400 MG PO TABS: 400.0000 mg | ORAL_TABLET | Freq: Four times a day (QID) | ORAL | Status: DC | PRN

## 2016-02-09 NOTE — ED Notes (Signed)
Patient transported to CT 

## 2016-02-09 NOTE — ED Notes (Signed)
Pt reports midsternal chest pains off and on for the last several weeks. Denies SOB. Denies radiation of pain. Denies cardiac hx. Former smoker. Hx of HTN. Lungs CTA. VSS.

## 2016-02-09 NOTE — Discharge Instructions (Signed)
Nonspecific Chest Pain  °Chest pain can be caused by many different conditions. There is always a chance that your pain could be related to something serious, such as a heart attack or a blood clot in your lungs. Chest pain can also be caused by conditions that are not life-threatening. If you have chest pain, it is very important to follow up with your health care provider. °CAUSES  °Chest pain can be caused by: °· Heartburn. °· Pneumonia or bronchitis. °· Anxiety or stress. °· Inflammation around your heart (pericarditis) or lung (pleuritis or pleurisy). °· A blood clot in your lung. °· A collapsed lung (pneumothorax). It can develop suddenly on its own (spontaneous pneumothorax) or from trauma to the chest. °· Shingles infection (varicella-zoster virus). °· Heart attack. °· Damage to the bones, muscles, and cartilage that make up your chest wall. This can include: °¨ Bruised bones due to injury. °¨ Strained muscles or cartilage due to frequent or repeated coughing or overwork. °¨ Fracture to one or more ribs. °¨ Sore cartilage due to inflammation (costochondritis). °RISK FACTORS  °Risk factors for chest pain may include: °· Activities that increase your risk for trauma or injury to your chest. °· Respiratory infections or conditions that cause frequent coughing. °· Medical conditions or overeating that can cause heartburn. °· Heart disease or family history of heart disease. °· Conditions or health behaviors that increase your risk of developing a blood clot. °· Having had chicken pox (varicella zoster). °SIGNS AND SYMPTOMS °Chest pain can feel like: °· Burning or tingling on the surface of your chest or deep in your chest. °· Crushing, pressure, aching, or squeezing pain. °· Dull or sharp pain that is worse when you move, cough, or take a deep breath. °· Pain that is also felt in your back, neck, shoulder, or arm, or pain that spreads to any of these areas. °Your chest pain may come and go, or it may stay  constant. °DIAGNOSIS °Lab tests or other studies may be needed to find the cause of your pain. Your health care provider may have you take a test called an ambulatory ECG (electrocardiogram). An ECG records your heartbeat patterns at the time the test is performed. You may also have other tests, such as: °· Transthoracic echocardiogram (TTE). During echocardiography, sound waves are used to create a picture of all of the heart structures and to look at how blood flows through your heart. °· Transesophageal echocardiogram (TEE). This is a more advanced imaging test that obtains images from inside your body. It allows your health care provider to see your heart in finer detail. °· Cardiac monitoring. This allows your health care provider to monitor your heart rate and rhythm in real time. °· Holter monitor. This is a portable device that records your heartbeat and can help to diagnose abnormal heartbeats. It allows your health care provider to track your heart activity for several days, if needed. °· Stress tests. These can be done through exercise or by taking medicine that makes your heart beat more quickly. °· Blood tests. °· Imaging tests. °TREATMENT  °Your treatment depends on what is causing your chest pain. Treatment may include: °· Medicines. These may include: °¨ Acid blockers for heartburn. °¨ Anti-inflammatory medicine. °¨ Pain medicine for inflammatory conditions. °¨ Antibiotic medicine, if an infection is present. °¨ Medicines to dissolve blood clots. °¨ Medicines to treat coronary artery disease. °· Supportive care for conditions that do not require medicines. This may include: °¨ Resting. °¨ Applying heat   or cold packs to injured areas. °¨ Limiting activities until pain decreases. °HOME CARE INSTRUCTIONS °· If you were prescribed an antibiotic medicine, finish it all even if you start to feel better. °· Avoid any activities that bring on chest pain. °· Do not use any tobacco products, including  cigarettes, chewing tobacco, or electronic cigarettes. If you need help quitting, ask your health care provider. °· Do not drink alcohol. °· Take medicines only as directed by your health care provider. °· Keep all follow-up visits as directed by your health care provider. This is important. This includes any further testing if your chest pain does not go away. °· If heartburn is the cause for your chest pain, you may be told to keep your head raised (elevated) while sleeping. This reduces the chance that acid will go from your stomach into your esophagus. °· Make lifestyle changes as directed by your health care provider. These may include: °¨ Getting regular exercise. Ask your health care provider to suggest some activities that are safe for you. °¨ Eating a heart-healthy diet. A registered dietitian can help you to learn healthy eating options. °¨ Maintaining a healthy weight. °¨ Managing diabetes, if necessary. °¨ Reducing stress. °SEEK MEDICAL CARE IF: °· Your chest pain does not go away after treatment. °· You have a rash with blisters on your chest. °· You have a fever. °SEEK IMMEDIATE MEDICAL CARE IF:  °· Your chest pain is worse. °· You have an increasing cough, or you cough up blood. °· You have severe abdominal pain. °· You have severe weakness. °· You faint. °· You have chills. °· You have sudden, unexplained chest discomfort. °· You have sudden, unexplained discomfort in your arms, back, neck, or jaw. °· You have shortness of breath at any time. °· You suddenly start to sweat, or your skin gets clammy. °· You feel nauseous or you vomit. °· You suddenly feel light-headed or dizzy. °· Your heart begins to beat quickly, or it feels like it is skipping beats. °These symptoms may represent a serious problem that is an emergency. Do not wait to see if the symptoms will go away. Get medical help right away. Call your local emergency services (911 in the [place] Do not drive yourself to the hospital. °  °This  information is not intended to replace advice given to you by your health care provider. Make sure you discuss any questions you have with your health care provider. °  °Document Released: 09/26/2005 Document Revised: 01/07/2015 Document Reviewed: 07/23/2014 °Elsevier Interactive Patient Education ©2016 [employer] ° °

## 2016-02-09 NOTE — ED Provider Notes (Signed)
CSN: [id number]     Arrival date & time 02/09/16  1115 History   First MD Initiated Contact with Patient 02/09/16 1258     Chief Complaint  Patient presents with  . Chest Pain     (Consider location/radiation/quality/duration/timing/severity/associated sxs/prior Treatment) HPI Patient reports as been getting some right upper chest pain for somewhere between 2-4 weeks. It is a sharp uncomfortable feeling in the right upper chest. It comes and goes. No cough or fever. No shortness of breath. Patient did have a trip to the [place] approximately 2 weeks ago. He reports he was snorkeling but no scuba diving. He denies calf pain. Also reports he has been working out and doing pushups. He does not the pain to be somewhat worse during pushups that he does not noted to be worse with exertional exercises. Past Medical History  Diagnosis Date  . Genital herpes   . Herpes simplex   . Tinea pedis, right   . Hyperlipidemia   . Gout   . Hypertension   . Hyperglycemia   . Hearing loss   . Hemorrhoids   . Bronchitis   . Hernia   . Unspecified disorder of male genital organs   . Osteoarthrosis, unspecified whether generalized or localized, unspecified site   . Backache, unspecified   . Insomnia, unspecified   . Impotence    Past Surgical History  Procedure Laterality Date  . Tonsillectomy  1959  . Fracture left femur  1984  . Nasal septum surgery  1997  . Ganglion repair of left forarm  2000  . Umbilical hernia repair  2002  . Ivp  66    ML old comp fracture   Family History  Problem Relation Age of Onset  . COPD Mother   . Aortic aneurysm Father    Social History  Substance Use Topics  . Smoking status: Former Smoker -- 30 years    Quit date: 01/01/1984  . Smokeless tobacco: Never Used     Comment: Quit about 30 years ago or longer   . Alcohol Use: 0.0 oz/week    6-7 Glasses of wine per week    Review of Systems  10 Systems reviewed and are negative for acute change except  as noted in the HPI.   Allergies  Review of patient's allergies indicates no known allergies.  Home Medications   Prior to Admission medications   Medication Sig Start Date End Date Taking? Authorizing Provider  acyclovir (ZOVIRAX) 400 MG tablet TAKE 1 TO PREVENT HERPES AND TAKE 5 TABLETS PER DAY FOR ACTIVE HERPES 01/11/16  Yes Estill Dooms, MD  amLODipine (NORVASC) 5 MG tablet TAKE 1 TABLET BY MOUTH EVERY DAY TO CONTROL BLOOD PRESSURE Patient taking differently: Take 5 mg by mouth daily.  10/24/15  Yes Estill Dooms, MD  aspirin EC 81 MG tablet Take 81 mg by mouth daily.   Yes Historical Provider, MD  febuxostat (ULORIC) 40 MG tablet Take One tablet by mouth once daily to prevent gout Patient taking differently: Take 40 mg by mouth daily.  11/23/15  Yes Estill Dooms, MD  losartan (COZAAR) 100 MG tablet TAKE 1 TABLET BY MOUTH EVERY DAY TO CONTROL BLOOD PRESSURE 09/26/15  Yes Estill Dooms, MD  OVER THE COUNTER MEDICATION Take 1 tablet by mouth daily. ZEAL   Yes Historical Provider, MD  tadalafil (CIALIS) 20 MG tablet Take 1 tablet (20 mg total) by mouth daily as needed for erectile dysfunction. 11/23/15  Yes Estill Dooms,  MD  zolpidem (AMBIEN) 10 MG tablet Take one tablet by mouth at bedtime as needed for sleep Patient taking differently: Take 10 mg by mouth at bedtime as needed for sleep.  01/13/16  Yes Estill Dooms, MD  colchicine 0.6 MG tablet take 1 tablet every 2 hours until gout subsides or diarrhea occurs Patient not taking: Reported on 02/09/2016 04/27/15   Estill Dooms, MD  ibuprofen (ADVIL,MOTRIN) 400 MG tablet Take 1 tablet (400 mg total) by mouth every 6 (six) hours as needed. 02/09/16   Charlesetta Shanks, MD   BP 140/89 mmHg  Pulse 73  Temp(Src) 97.6 F (36.4 C) (Oral)  Resp 17  SpO2 97% Physical Exam  Constitutional: He is oriented to person, place, and time. He appears well-developed and well-nourished.  HENT:  Head: Normocephalic and atraumatic.  Eyes: EOM are  normal. Pupils are equal, round, and reactive to light.  Neck: Neck supple.  Cardiovascular: Normal rate, regular rhythm, normal heart sounds and intact distal pulses.   Pulmonary/Chest: Effort normal and breath sounds normal.  Abdominal: Soft. Bowel sounds are normal. He exhibits no distension. There is no tenderness.  Musculoskeletal: Normal range of motion. He exhibits no edema or tenderness.  Neurological: He is alert and oriented to person, place, and time. He has normal strength. He exhibits normal muscle tone. Coordination normal. GCS eye subscore is 4. GCS verbal subscore is 5. GCS motor subscore is 6.  Skin: Skin is warm, dry and intact.  Psychiatric: He has a normal mood and affect.    ED Course  Procedures (including critical care time) Labs Review Labs Reviewed  BASIC METABOLIC PANEL - Abnormal; Notable for the following:    Glucose, Bld 130 (*)    All other components within normal limits  TROPONIN I  CBC    Imaging Review Dg Chest 2 View  02/09/2016  CLINICAL DATA:  Right chest pain intermittently for several weeks. EXAM: CHEST  2 VIEW COMPARISON:  12/03/2013 FINDINGS: Indistinct airspace opacity in the left lower lobe. Questionable contour irregularity of the left hemidiaphragm centrally. Both findings are somewhat similar to 12/03/2013. Suspected bandlike retro diaphragmatic opacity on the right, likewise stable. Airway thickening is present, suggesting bronchitis or reactive airways disease. Various old healed left rib fractures posterolaterally and laterally. IMPRESSION: 1. 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 The patient has multiple old left-sided rib fractures, and the appearance could be from scarring and diaphragmatic injury related to prior left hemithoracic injury, although this is speculative. There is also some bandlike retrodiaphragmatic opacity in the right lower lobe  probably from scarring given the long-term chronicity. It might be prudent to obtain a CT chest in this patient, despite the lack of progression over the last several years, in order to establish a baseline and make sure that there is not some sort of low-grade malignancy involving the lower lobes. 2. Airway thickening is present, suggesting bronchitis or reactive airways disease. Electronically Signed   By: Van Clines M.D.   On: 02/09/2016 12:35   Ct Angio Chest Pe W/cm &/or Wo Cm  02/09/2016  CLINICAL DATA:  Right upper chest pain. EXAM: CT ANGIOGRAPHY CHEST WITH CONTRAST TECHNIQUE: Multidetector CT imaging of the chest was performed using the standard protocol during bolus administration of intravenous contrast. Multiplanar CT image reconstructions and MIPs were obtained to evaluate the vascular anatomy. CONTRAST:  137mL OMNIPAQUE IOHEXOL 350 MG/ML SOLN COMPARISON:  Chest radiograph from the  same date. FINDINGS: Mediastinum/Lymph Nodes: No pulmonary emboli or thoracic aortic dissection identified. No masses or pathologically enlarged lymph nodes identified. The aorta is slightly torturous. The heart is normal in size. There is no pericardial effusion. 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 Lungs/Pleura: No pulmonary mass, infiltrate, or effusion. Bibasilar hypoventilatory changes. The evaluation is suboptimal due to motion artifact. Upper abdomen: No acute findings. Musculoskeletal: No chest wall mass or suspicious bone lesions identified. Review of the MIP images confirms the above findings. IMPRESSION: No evidence of pulmonary embolus or thoracic aortic dissection. 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 Hypoventilatory changes of the lung bases without evidence of focal airspace consolidation. Electronically Signed   By: Fidela Salisbury M.D.   On: 02/09/2016 15:44   I have  personally reviewed and evaluated these images and lab results as part of my medical decision-making.   EKG Interpretation None      MDM   Final diagnoses:  Chest pain, unspecified chest pain type   Patient has travel history within the past 2 weeks. CT does not show pulmonary embolus. History sounds of low probability to be cardiac ischemic in nature. Patient has been able to exercise at normal capacity without exertional dyspnea or chest pain. At this time I do feel he is safe for discharge with follow-up with his family physician to discuss outpatient cardiac stress.    Charlesetta Shanks, MD 02/09/16 [phone]

## 2016-02-13 ENCOUNTER — Other Ambulatory Visit: Payer: Self-pay | Admitting: *Deleted

## 2016-02-13 MED ORDER — ZOLPIDEM TARTRATE 10 MG PO TABS: ORAL_TABLET | ORAL | Status: DC

## 2016-02-13 NOTE — Telephone Encounter (Signed)
Patient requested. Phoned into [facility]

## 2016-03-07 ENCOUNTER — Ambulatory Visit (INDEPENDENT_AMBULATORY_CARE_PROVIDER_SITE_OTHER): Payer: Commercial Managed Care - HMO

## 2016-03-07 DIAGNOSIS — Z23 Encounter for immunization: Secondary | ICD-10-CM | POA: Diagnosis not present

## 2016-03-09 ENCOUNTER — Other Ambulatory Visit: Payer: Commercial Managed Care - HMO

## 2016-03-13 ENCOUNTER — Ambulatory Visit: Payer: Commercial Managed Care - HMO | Admitting: Internal Medicine

## 2016-03-15 ENCOUNTER — Other Ambulatory Visit: Payer: Self-pay | Admitting: *Deleted

## 2016-03-15 MED ORDER — ZOLPIDEM TARTRATE 10 MG PO TABS: ORAL_TABLET | ORAL | Status: DC

## 2016-03-15 NOTE — Telephone Encounter (Signed)
Patient requested and phoned to pharmacy. 

## 2016-03-19 ENCOUNTER — Telehealth: Payer: Self-pay | Admitting: *Deleted

## 2016-03-19 NOTE — Telephone Encounter (Signed)
Patient called and stated that he thinks he has food poisoning. Stated that he is having N&V and Diarrhea since yesterday. Started at 2am this morning. Instructed patient to stay well hydrated and gave him examples of eating a bland diet. Patient to call me back tomorrow to let me know how he is feeling.

## 2016-03-21 NOTE — Telephone Encounter (Signed)
Called and checked on patient and he is feeling better. Went to work yesterday and eating. No diarrhea now.

## 2016-03-23 ENCOUNTER — Other Ambulatory Visit: Payer: Commercial Managed Care - HMO

## 2016-03-23 ENCOUNTER — Other Ambulatory Visit: Payer: Self-pay | Admitting: Internal Medicine

## 2016-03-23 DIAGNOSIS — E785 Hyperlipidemia, unspecified: Secondary | ICD-10-CM

## 2016-03-23 DIAGNOSIS — I10 Essential (primary) hypertension: Secondary | ICD-10-CM

## 2016-03-23 DIAGNOSIS — R739 Hyperglycemia, unspecified: Secondary | ICD-10-CM | POA: Diagnosis not present

## 2016-03-23 DIAGNOSIS — M1A9XX0 Chronic gout, unspecified, without tophus (tophi): Secondary | ICD-10-CM | POA: Diagnosis not present

## 2016-03-24 LAB — BASIC METABOLIC PANEL
BUN / CREAT RATIO: 13 (ref 10–22)
BUN: 11 mg/dL (ref 8–27)
CO2: 22 mmol/L (ref 18–29)
CREATININE: 0.86 mg/dL (ref 0.76–1.27)
Calcium: 9.1 mg/dL (ref 8.6–10.2)
Chloride: 103 mmol/L (ref 96–106)
GFR, EST AFRICAN AMERICAN: 104 mL/min/{1.73_m2} (ref >59)
GFR, EST NON AFRICAN AMERICAN: 90 mL/min/{1.73_m2} (ref >59)
GLUCOSE: 88 mg/dL (ref 65–99)
Potassium: 4.7 mmol/L (ref 3.5–5.2)
SODIUM: 141 mmol/L (ref 134–144)

## 2016-03-24 LAB — LIPID PANEL
CHOL/HDL RATIO: 4.6 ratio (ref 0.0–5.0)
Cholesterol, Total: 204 mg/dL — ABNORMAL HIGH (ref 100–199)
HDL: 44 mg/dL (ref >39)
LDL CALC: 114 mg/dL — AB (ref 0–99)
Triglycerides: 228 mg/dL — ABNORMAL HIGH (ref 0–149)
VLDL CHOLESTEROL CAL: 46 mg/dL — AB (ref 5–40)

## 2016-03-24 LAB — URIC ACID: URIC ACID: 5.9 mg/dL (ref 3.7–8.6)

## 2016-03-26 ENCOUNTER — Other Ambulatory Visit: Payer: Commercial Managed Care - HMO

## 2016-03-28 ENCOUNTER — Ambulatory Visit (INDEPENDENT_AMBULATORY_CARE_PROVIDER_SITE_OTHER): Payer: Commercial Managed Care - HMO | Admitting: Internal Medicine

## 2016-03-28 ENCOUNTER — Encounter: Payer: Self-pay | Admitting: Internal Medicine

## 2016-03-28 VITALS — BP 146/84 | HR 79 | Temp 97.9°F | Ht 69.0 in | Wt 213.0 lb

## 2016-03-28 DIAGNOSIS — E669 Obesity, unspecified: Secondary | ICD-10-CM

## 2016-03-28 DIAGNOSIS — E785 Hyperlipidemia, unspecified: Secondary | ICD-10-CM | POA: Diagnosis not present

## 2016-03-28 DIAGNOSIS — I10 Essential (primary) hypertension: Secondary | ICD-10-CM

## 2016-03-28 DIAGNOSIS — R739 Hyperglycemia, unspecified: Secondary | ICD-10-CM

## 2016-03-28 DIAGNOSIS — M1A9XX0 Chronic gout, unspecified, without tophus (tophi): Secondary | ICD-10-CM

## 2016-03-28 NOTE — Patient Instructions (Signed)
Reduce consumption of carbohydrates: potato, pasta, and breads.

## 2016-03-28 NOTE — Progress Notes (Signed)
Patient ID: Rodney Foster, male   DOB: 05/12/1950, 66 y.o.   MRN: [id number]   Location:  [facility]  Provider: Jeanmarie Hubert, M.D.  Code Status: Full Goals of Care:  Advanced Directives 02/09/2016  Does patient have an advance directive? No  Would patient like information on creating an advanced directive? No - patient declined information     Chief Complaint  Patient presents with  . Medical Management of Chronic Issues    Medical Management of Chronic Issues. 6 month follow up    HPI: Patient is a 66 y.o. male seen today for medical management of chronic diseases.    Seen in ER 02/09/16 for chest pain. No MI. He thinks it was a pleurisy. Did not return.  Obese - patient says he weighs more than he ever did in his life. He knows he needs to lose weight and change his diet.  Essential hypertension - mild elevation in SBP.  Hyperlipidemia - light rise in LDL. Adequately controlled in March 2017.  Hyperglycemia - glucose back to normal at 88. Hemoglobin A1c 5.5.  Chronic gout without tophus, unspecified cause, unspecified site - no recent gout attacks.Uric acid stable.     Past Medical History  Diagnosis Date  . Genital herpes   . Herpes simplex   . Tinea pedis, right   . Hyperlipidemia   . Gout   . Hypertension   . Hyperglycemia   . Hearing loss   . Hemorrhoids   . Bronchitis   . Hernia   . Unspecified disorder of male genital organs   . Osteoarthrosis, unspecified whether generalized or localized, unspecified site   . Backache, unspecified   . Insomnia, unspecified   . Impotence     Past Surgical History  Procedure Laterality Date  . Tonsillectomy  1959  . Fracture left femur  1984  . Nasal septum surgery  1997  . Ganglion repair of left forarm  2000  . Umbilical hernia repair  2002  . Ivp  88    ML old comp fracture    No Known Allergies    Medication List       This list is accurate as of: 03/28/16  1:15 PM.  Always use your most recent med  list.               acyclovir 400 MG tablet  Commonly known as:  ZOVIRAX  TAKE 1 TO PREVENT HERPES AND TAKE 5 TABLETS PER DAY FOR ACTIVE HERPES     amLODipine 5 MG tablet  Commonly known as:  NORVASC  TAKE 1 TABLET BY MOUTH EVERY DAY TO CONTROL BLOOD PRESSURE     aspirin EC 81 MG tablet  Take 81 mg by mouth daily.     colchicine 0.6 MG tablet  take 1 tablet every 2 hours until gout subsides or diarrhea occurs     febuxostat 40 MG tablet  Commonly known as:  ULORIC  Take One tablet by mouth once daily to prevent gout     ibuprofen 400 MG tablet  Commonly known as:  ADVIL,MOTRIN  Take 1 tablet (400 mg total) by mouth every 6 (six) hours as needed.     losartan 100 MG tablet  Commonly known as:  COZAAR  TAKE 1 TABLET BY MOUTH EVERY DAY TO CONTROL BLOOD PRESSURE     OVER THE COUNTER MEDICATION  Take 1 tablet by mouth daily. ZEAL     tadalafil 20 MG tablet  Commonly known as:  CIALIS  Take 1 tablet (20 mg total) by mouth daily as needed for erectile dysfunction.     zolpidem 10 MG tablet  Commonly known as:  AMBIEN  Take one tablet by mouth at bedtime as needed for sleep        Review of Systems:  Review of Systems  Constitutional: Negative for chills, diaphoresis, activity change and appetite change.       Intentional weight loss  HENT: Negative.   Eyes:       Spots dancing before his eyes from time to time.  Respiratory: Negative.        Chronically hoarse  Cardiovascular: Negative for chest pain, palpitations and leg swelling.  Gastrointestinal: Negative for nausea, abdominal pain, diarrhea, abdominal distention and rectal pain.  Endocrine: Negative.   Genitourinary: Negative.        Impotent. Cialis helps. Burning across scrotal area.  Musculoskeletal: Positive for arthralgias (right ankle and foot).  Skin:       Tinea unguium left great toenail medially  Allergic/Immunologic: Negative.   Neurological: Negative.   Hematological: Negative.     Psychiatric/Behavioral: Negative.     Health Maintenance  Topic Date Due  . Hepatitis C Screening  03/11/1950  . COLONOSCOPY  12/31/2006  . ZOSTAVAX  12/07/2008  . PNA vac Low Risk Adult (1 of 2 - PCV13) 12/07/2013  . INFLUENZA VACCINE  07/31/2016  . TETANUS/TDAP  12/04/2023    Physical Exam: Filed Vitals:   03/28/16 1242  BP: 146/84  Pulse: 79  Temp: 97.9 F (36.6 C)  TempSrc: Oral  Height: 5\' 9"  (1.753 m)  Weight: 213 lb (96.616 kg)   Body mass index is 31.44 kg/(m^2). Physical Exam  Constitutional: He is oriented to person, place, and time. He appears well-developed and well-nourished. No distress.  HENT:  Right Ear: External ear normal.  Left Ear: External ear normal.  Nose: Nose normal.  Mouth/Throat: Oropharynx is clear and moist. No oropharyngeal exudate.  Eyes: Conjunctivae and EOM are normal. Pupils are equal, round, and reactive to light.  Corrective lenses  Cardiovascular: Normal rate, regular rhythm, normal heart sounds and intact distal pulses.  Exam reveals no gallop and no friction rub.   No murmur heard. Pulmonary/Chest: No respiratory distress. He has no wheezes. He has no rales. He exhibits no tenderness.  Chronic hoarseness  Abdominal: He exhibits no distension and no mass. There is no tenderness.  Genitourinary:  Mild erythema bilaterally and scrotal area. No rash. No evidence of folliculitis. No adenopathy.  Musculoskeletal: Normal range of motion. He exhibits no edema or tenderness.       Right ankle: He exhibits normal range of motion, no swelling and no ecchymosis. No tenderness.       Right foot: There is normal range of motion, no tenderness, no bony tenderness, no swelling and normal capillary refill.  Neurological: He is alert and oriented to person, place, and time. He has normal reflexes. No cranial nerve deficit. Coordination normal.  Skin: No rash noted. No erythema. No pallor.  Tinea unguium left great toenail medially  Psychiatric:  His behavior is normal. Thought content normal.    Labs reviewed: Basic Metabolic Panel:  Recent Labs  09/12/15 0817 02/09/16 1140 03/23/16 0803  NA 141 141 141  K 4.6 4.2 4.7  CL 104 106 103  CO2 22 24 22   GLUCOSE 84 130* 88  BUN 10 11 11   CREATININE 0.77 1.13 0.86  CALCIUM 9.0 9.6 9.1   Liver Function Tests:  Recent Labs  09/12/15 0817  AST 28  ALT 30  ALKPHOS 78  BILITOT 0.4  PROT 6.3  ALBUMIN 4.0   No results for input(s): LIPASE, AMYLASE in the last 8760 hours. No results for input(s): AMMONIA in the last 8760 hours. CBC:  Recent Labs  02/09/16 1140  WBC 9.2  HGB 16.8  HCT 47.7  MCV 94.6  PLT 263   Lipid Panel:  Recent Labs  09/12/15 0817 03/23/16 0803  CHOL 199 204*  HDL 62 44  LDLCALC 103* 114*  TRIG 171* 228*  CHOLHDL 3.2 4.6   Lab Results  Component Value Date   HGBA1C 5.5 09/12/2015    Assessment/Plan  1. Obese Capsule no weight loss. Provided with diet.  2. Essential hypertension Continue current medication - Comprehensive metabolic panel; Future  3. Hyperlipidemia Continue to monitor - Lipid panel; Future  4. Hyperglycemia ontinue to monitor - Comprehensive metabolic panel; Future  5. Chronic gout without tophus, unspecified cause, unspecified site Continue to monitor - Uric acid; Future

## 2016-05-11 ENCOUNTER — Other Ambulatory Visit: Payer: Self-pay | Admitting: *Deleted

## 2016-05-11 MED ORDER — ZOLPIDEM TARTRATE 10 MG PO TABS: ORAL_TABLET | ORAL | Status: DC

## 2016-05-11 NOTE — Telephone Encounter (Signed)
Patient requested and Rx phoned to pharmacy.

## 2016-06-15 ENCOUNTER — Telehealth: Payer: Self-pay | Admitting: *Deleted

## 2016-06-15 NOTE — Telephone Encounter (Signed)
Patient presented to the office stating he needed to be seen for possible shingles. Patient showed this to me in a room and does have visible rash of about 5-7 red bumps under right breast. Patient states it is uncomfortable with alittle pain. Patient wanted to be seen today but no available appointments. Patient will not go to Urgent care. He stated he would like your advise. Please Advise.

## 2016-06-18 NOTE — Telephone Encounter (Signed)
Ok to start valtrex 1gm #21 take 1 tab po TID no RF; wash hands frequently; avoid contact with pregnant women and young children as rash is contagious until scabs fall off.

## 2016-06-18 NOTE — Telephone Encounter (Signed)
Patient called back and stated to disregard this message. He stated that the rash was gone and he thinks it was a heat rash.

## 2016-06-18 NOTE — Telephone Encounter (Signed)
LMOM to return call.

## 2016-06-21 ENCOUNTER — Other Ambulatory Visit: Payer: Self-pay | Admitting: Internal Medicine

## 2016-07-13 ENCOUNTER — Other Ambulatory Visit: Payer: Self-pay | Admitting: Internal Medicine

## 2016-07-13 NOTE — Telephone Encounter (Signed)
Patient requested refill. Going out of [place] next Tuesday.

## 2016-07-19 ENCOUNTER — Encounter: Payer: Self-pay | Admitting: Internal Medicine

## 2016-08-01 ENCOUNTER — Other Ambulatory Visit: Payer: Self-pay | Admitting: *Deleted

## 2016-08-01 MED ORDER — ZOLPIDEM TARTRATE 10 MG PO TABS: ORAL_TABLET | ORAL | 1 refills | Status: DC

## 2016-08-13 ENCOUNTER — Encounter: Payer: Self-pay | Admitting: Internal Medicine

## 2016-08-13 ENCOUNTER — Ambulatory Visit (INDEPENDENT_AMBULATORY_CARE_PROVIDER_SITE_OTHER): Payer: Commercial Managed Care - HMO | Admitting: Internal Medicine

## 2016-08-13 VITALS — BP 140/90 | HR 74 | Temp 97.7°F | Wt 216.0 lb

## 2016-08-13 DIAGNOSIS — J040 Acute laryngitis: Secondary | ICD-10-CM | POA: Diagnosis not present

## 2016-08-13 MED ORDER — AZITHROMYCIN 250 MG PO TABS: ORAL_TABLET | ORAL | 0 refills | Status: DC

## 2016-08-13 NOTE — Progress Notes (Signed)
Location:  [facility] Provider: Rozann Holts L. Mariea Clonts, D.O., C.M.D. PCP:  Dr. Nyoka Cowden  Goals of Care:  Advanced Directives 02/09/2016  Does patient have an advance directive? No  Would patient like information on creating an advanced directive? No - patient declined information     Chief Complaint  Patient presents with  . Acute Visit    sore throat x1 week    HPI: Patient is a Rodney Foster with h/o HL, HTN, hyperglycemia, obesity and insomnia seen today for an acute visit for laryngitis for one week.  His friend's son had a bad cold and he was over at their house.  He wasn't feeling well.  Throat is not that sore.  No known fever, chills.  No increase in congestion.  Has been off of work and needs to get back to it.    Past Medical History:  Diagnosis Date  . Backache, unspecified   . Bronchitis   . Genital herpes   . Gout   . Hearing loss   . Hemorrhoids   . Hernia   . Herpes simplex   . Hyperglycemia   . Hyperlipidemia   . Hypertension   . Impotence   . Insomnia, unspecified   . Osteoarthrosis, unspecified whether generalized or localized, unspecified site   . Tinea pedis, right   . Unspecified disorder of Foster genital organs     Past Surgical History:  Procedure Laterality Date  . fracture left femur  1984  . ganglion repair of left forarm  2000  . IVP  97   ML old comp fracture  . NASAL SEPTUM SURGERY  1997  . TONSILLECTOMY  1959  . UMBILICAL HERNIA REPAIR  2002    No Known Allergies    Medication List       Accurate as of 08/13/16  2:44 PM. Always use your most recent med list.          acyclovir 400 MG tablet Commonly known as:  ZOVIRAX TAKE 1 TO PREVENT HERPES AND TAKE 5 TABLETS PER DAY FOR ACTIVE HERPES   amLODipine 5 MG tablet Commonly known as:  NORVASC TAKE 1 TABLET BY MOUTH EVERY DAY TO CONTROL BLOOD PRESSURE   aspirin EC 81 MG tablet Take 81 mg by mouth daily.   febuxostat 40 MG tablet Commonly known as:  ULORIC Take 40 mg by mouth  daily.   ibuprofen 400 MG tablet Commonly known as:  ADVIL,MOTRIN Take 1 tablet (400 mg total) by mouth every 6 (six) hours as needed.   losartan 100 MG tablet Commonly known as:  COZAAR TAKE 1 TABLET BY MOUTH EVERY DAY TO CONTROL BLOOD PRESSURE   OVER THE COUNTER MEDICATION Take 1 tablet by mouth daily. ZEAL   tadalafil 20 MG tablet Commonly known as:  CIALIS Take 1 tablet (20 mg total) by mouth daily as needed for erectile dysfunction.   zolpidem 10 MG tablet Commonly known as:  AMBIEN Take one tablet by mouth at bedtime as needed for sleep       Review of Systems:  Review of Systems  Constitutional: Negative for chills and fever.  HENT: Positive for sore throat. Negative for congestion and ear pain.        Hoarseness  Eyes: Negative for discharge and redness.  Respiratory: Negative for cough, sputum production and shortness of breath.   Cardiovascular: Negative for chest pain, palpitations and leg swelling.  Gastrointestinal: Negative for diarrhea, nausea and vomiting.  Skin: Negative for rash.  Neurological:  Negative for headaches.    Health Maintenance  Topic Date Due  . Hepatitis C Screening  Jul 01, 1950  . COLONOSCOPY  12/31/2006  . ZOSTAVAX  12/07/2008  . PNA vac Low Risk Adult (1 of 2 - PCV13) 12/07/2013  . INFLUENZA VACCINE  07/31/2016  . TETANUS/TDAP  12/04/2023    Physical Exam: Vitals:   08/13/16 1433  BP: 140/90  Pulse: 74  Temp: 97.7 F (36.5 C)  TempSrc: Oral  SpO2: 96%  Weight: 216 lb (98 kg)   Body mass index is 31.9 kg/m. Physical Exam  Constitutional: He is oriented to person, place, and time. He appears well-developed and well-nourished. No distress.  HENT:  Head: Normocephalic and atraumatic.  Right Ear: External ear normal.  Left Ear: External ear normal.  Nose: Nose normal.  Mouth/Throat: Oropharynx is clear and moist. No oropharyngeal exudate.  Mild erythema of his throat  Eyes: Conjunctivae and EOM are normal. Pupils are  equal, round, and reactive to light.  Neck: Neck supple. No thyromegaly present.  hoarse  Cardiovascular: Normal rate, regular rhythm, normal heart sounds and intact distal pulses.   Pulmonary/Chest: Effort normal and breath sounds normal. No stridor. No respiratory distress.  Lymphadenopathy:    He has cervical adenopathy.  Neurological: He is alert and oriented to person, place, and time.    Labs reviewed: Basic Metabolic Panel:  Recent Labs  09/12/15 0817 02/09/16 1140 03/23/16 0803  NA 141 141 141  K 4.6 4.2 4.7  CL 104 106 103  CO2 22 24 22   GLUCOSE 84 130* 88  BUN 10 11 11   CREATININE 0.77 1.13 0.86  CALCIUM 9.0 9.6 9.1   Liver Function Tests:  Recent Labs  09/12/15 0817  AST 28  ALT 30  ALKPHOS 78  BILITOT 0.4  PROT 6.3  ALBUMIN 4.0   No results for input(s): LIPASE, AMYLASE in the last 8760 hours. No results for input(s): AMMONIA in the last 8760 hours. CBC:  Recent Labs  02/09/16 1140  WBC 9.2  HGB 16.8  HCT 47.7  MCV 94.6  PLT 263   Lipid Panel:  Recent Labs  09/12/15 0817 03/23/16 0803  CHOL 199 204*  HDL 62 44  LDLCALC 103* 114*  TRIG 171* 228*  CHOLHDL 3.2 4.6   Lab Results  Component Value Date   HGBA1C 5.5 09/12/2015    Assessment/Plan 1. Laryngitis, acute - cont saline gargles, fluids, treat with abx due to lack of improvement for a full week and pt needs to return to work - azithromycin (ZITHROMAX) 250 MG tablet; 2 tabs today, then one tab daily for 4 days  Dispense: 6 tablet; Refill: 0  -if not better after 1-2 wks, would consider ENT eval due to smoking history  Labs/tests ordered: none  Next appt:  10/08/2016 keep as scheduled  Harrel Ferrone L. Damante Spragg, D.O. [facility] [address], [place] [address] Cell Phone (Mon-Fri 8am-5pm):  [phone] On Call:  [phone] & follow prompts after 5pm & weekends Office Phone:  [phone] Office Fax:  [phone]

## 2016-08-13 NOTE — Patient Instructions (Addendum)
Drink plenty of water Eat yogurt while on the antibiotic If hoarseness does not improve, please call back. Continue salt water rinses Chloraseptic spray over the counter if throat becomes more painful

## 2016-08-22 NOTE — Addendum Note (Signed)
Addended by: Rafael Bihari A on: 08/22/2016 11:57 AM   Modules accepted: Orders

## 2016-09-27 ENCOUNTER — Other Ambulatory Visit: Payer: Self-pay | Admitting: *Deleted

## 2016-09-27 MED ORDER — ZOLPIDEM TARTRATE 10 MG PO TABS: ORAL_TABLET | ORAL | 1 refills | Status: DC

## 2016-09-27 NOTE — Telephone Encounter (Signed)
Patient requested and I phoned to pharmacy.

## 2016-10-08 ENCOUNTER — Ambulatory Visit: Payer: Self-pay

## 2016-10-08 ENCOUNTER — Other Ambulatory Visit: Payer: Commercial Managed Care - HMO

## 2016-10-08 DIAGNOSIS — E785 Hyperlipidemia, unspecified: Secondary | ICD-10-CM

## 2016-10-08 DIAGNOSIS — M1A9XX0 Chronic gout, unspecified, without tophus (tophi): Secondary | ICD-10-CM

## 2016-10-08 DIAGNOSIS — I10 Essential (primary) hypertension: Secondary | ICD-10-CM

## 2016-10-08 DIAGNOSIS — R739 Hyperglycemia, unspecified: Secondary | ICD-10-CM

## 2016-10-08 LAB — COMPREHENSIVE METABOLIC PANEL
ALK PHOS: 86 U/L (ref 40–115)
ALT: 37 U/L (ref 9–46)
AST: 27 U/L (ref 10–35)
Albumin: 4.3 g/dL (ref 3.6–5.1)
BUN: 15 mg/dL (ref 7–25)
CALCIUM: 9.4 mg/dL (ref 8.6–10.3)
CO2: 27 mmol/L (ref 20–31)
Chloride: 100 mmol/L (ref 98–110)
Creat: 1.06 mg/dL (ref 0.70–1.25)
GLUCOSE: 98 mg/dL (ref 65–99)
POTASSIUM: 4.7 mmol/L (ref 3.5–5.3)
Sodium: 137 mmol/L (ref 135–146)
Total Bilirubin: 0.7 mg/dL (ref 0.2–1.2)
Total Protein: 6.7 g/dL (ref 6.1–8.1)

## 2016-10-08 LAB — LIPID PANEL
Cholesterol: 242 mg/dL — ABNORMAL HIGH (ref 125–200)
HDL: 48 mg/dL (ref >=40)
LDL Cholesterol: 160 mg/dL — ABNORMAL HIGH (ref <130)
Total CHOL/HDL Ratio: 5 Ratio (ref <=5.0)
Triglycerides: 172 mg/dL — ABNORMAL HIGH (ref <150)
VLDL: 34 mg/dL — ABNORMAL HIGH (ref <30)

## 2016-10-08 LAB — URIC ACID: Uric Acid, Serum: 5.8 mg/dL (ref 4.0–8.0)

## 2016-10-10 ENCOUNTER — Ambulatory Visit (INDEPENDENT_AMBULATORY_CARE_PROVIDER_SITE_OTHER): Payer: Commercial Managed Care - HMO | Admitting: Internal Medicine

## 2016-10-10 ENCOUNTER — Encounter: Payer: Commercial Managed Care - HMO | Admitting: Internal Medicine

## 2016-10-10 ENCOUNTER — Encounter: Payer: Self-pay | Admitting: Internal Medicine

## 2016-10-10 VITALS — BP 154/86 | HR 79 | Temp 97.5°F | Ht 70.0 in | Wt 216.0 lb

## 2016-10-10 DIAGNOSIS — E785 Hyperlipidemia, unspecified: Secondary | ICD-10-CM | POA: Diagnosis not present

## 2016-10-10 DIAGNOSIS — Z6830 Body mass index (BMI) 30.0-30.9, adult: Secondary | ICD-10-CM | POA: Diagnosis not present

## 2016-10-10 DIAGNOSIS — Z23 Encounter for immunization: Secondary | ICD-10-CM | POA: Diagnosis not present

## 2016-10-10 DIAGNOSIS — I10 Essential (primary) hypertension: Secondary | ICD-10-CM | POA: Diagnosis not present

## 2016-10-10 DIAGNOSIS — E6609 Other obesity due to excess calories: Secondary | ICD-10-CM | POA: Diagnosis not present

## 2016-10-10 DIAGNOSIS — I493 Ventricular premature depolarization: Secondary | ICD-10-CM | POA: Insufficient documentation

## 2016-10-10 DIAGNOSIS — R739 Hyperglycemia, unspecified: Secondary | ICD-10-CM | POA: Diagnosis not present

## 2016-10-10 DIAGNOSIS — N529 Male erectile dysfunction, unspecified: Secondary | ICD-10-CM

## 2016-10-10 MED ORDER — TADALAFIL 20 MG PO TABS: 20.0000 mg | ORAL_TABLET | Freq: Every day | ORAL | 5 refills | Status: DC | PRN

## 2016-10-10 NOTE — Progress Notes (Signed)
HISTORY AND PHYSICAL  Location:    [facility]   Place of Service:   OFFICE  Extended Emergency Contact Information Primary Emergency Contact: Daoughty,Sandy  [place] Phone: [phone] Mobile Phone: [phone] Relation: None  Advanced Directive information Does patient have an advance directive?: Yes, Type of Advance Directive: Living will  Chief Complaint  Patient presents with  . Annual Exam    Wellness exam  . Medical Management of Chronic Issues    blood pressure, cholesterol, hyperglycemia, review labs  . MMSE    30/30 passed clock drawing    HPI:  Hypertension, essential - miid elevation in SBP  Hyperglycemia - controlled  Hyperlipidemia, unspecified hyperlipidemia type - higher LDL than in the past. gaining weight.  Class 1 obesity due to excess calories without serious comorbidity with body mass index (BMI) of 30.0 to 30.9 in adult - steadily gaining over the last 2 years.    Past Medical History:  Diagnosis Date  . Backache, unspecified   . Bronchitis   . Genital herpes   . Gout   . Hearing loss   . Hemorrhoids   . Hernia   . Herpes simplex   . Hyperglycemia   . Hyperlipidemia   . Hypertension   . Impotence   . Insomnia, unspecified   . Osteoarthrosis, unspecified whether generalized or localized, unspecified site   . Tinea pedis, right   . Unspecified disorder of male genital organs     Past Surgical History:  Procedure Laterality Date  . fracture left femur  1984  . ganglion repair of left forarm  2000  . IVP  60   ML old comp fracture  . NASAL SEPTUM SURGERY  1997  . TONSILLECTOMY  1959  . UMBILICAL HERNIA REPAIR  2002    Patient Care Team: Estill Dooms, MD as PCP - General (Internal Medicine)  Social History   Social History  . Marital status: Divorced    Spouse name: N/A  . Number of children: N/A  . Years of education: N/A   Occupational History  . Not on file.   Social History Main Topics  .  Smoking status: Former Smoker    Years: 30.00    Quit date: 01/01/1984  . Smokeless tobacco: Never Used     Comment: Quit about 30 years ago or longer   . Alcohol use 0.0 oz/week    6 - 7 Glasses of wine per week  . Drug use: No  . Sexual activity: Not on file   Other Topics Concern  . Not on file   Social History Narrative  . No narrative on file    reports that he quit smoking about 32 years ago. He quit after 30.00 years of use. He has never used smokeless tobacco. He reports that he drinks alcohol. He reports that he does not use drugs.  Family History  Problem Relation Age of Onset  . COPD Mother   . Aortic aneurysm Father    Family Status  Relation Status  . Mother Deceased at age 19  . Father Deceased at age 48  . Brother Deceased  . Brother Alive  . Daughter Alive  . Son Alive  . Daughter Alive    Immunization History  Administered Date(s) Administered  . Influenza,inj,Quad PF,36+ Mos 12/02/2013, 03/07/2016  . Influenza-Unspecified 12/05/2011  . Td 12/31/1994, 12/03/2013    No Known Allergies  Medications: Patient's Medications  New Prescriptions   No medications on file  Previous Medications   ACYCLOVIR (ZOVIRAX) 400 MG TABLET    TAKE 1 TO PREVENT HERPES AND TAKE 5 TABLETS PER DAY FOR ACTIVE HERPES   AMLODIPINE (NORVASC) 5 MG TABLET    TAKE 1 TABLET BY MOUTH EVERY DAY TO CONTROL BLOOD PRESSURE   ASPIRIN EC 81 MG TABLET    Take 81 mg by mouth daily.   FEBUXOSTAT (ULORIC) 40 MG TABLET    Take 40 mg by mouth daily.   IBUPROFEN (ADVIL,MOTRIN) 400 MG TABLET    Take 1 tablet (400 mg total) by mouth every 6 (six) hours as needed.   LOSARTAN (COZAAR) 100 MG TABLET    TAKE 1 TABLET BY MOUTH EVERY DAY TO CONTROL BLOOD PRESSURE   OVER THE COUNTER MEDICATION    Take 1 tablet by mouth daily. ZEAL   TADALAFIL (CIALIS) 20 MG TABLET    Take 1 tablet (20 mg total) by mouth daily as needed for erectile dysfunction.   ZOLPIDEM (AMBIEN) 10 MG TABLET    Take one tablet by  mouth at bedtime as needed for sleep  Modified Medications   No medications on file  Discontinued Medications   AZITHROMYCIN (ZITHROMAX) 250 MG TABLET    2 tabs today, then one tab daily for 4 days    Review of Systems  Constitutional: Negative for activity change, appetite change, chills and diaphoresis.       Intentional weight loss  HENT: Negative.   Eyes:       Spots dancing before his eyes from time to time.  Respiratory: Negative.        Chronically hoarse  Cardiovascular: Negative for chest pain, palpitations and leg swelling.  Gastrointestinal: Negative for abdominal distention, abdominal pain, diarrhea, nausea and rectal pain.  Endocrine: Negative.   Genitourinary: Negative.        Impotent. Cialis helps. Burning across scrotal area.  Musculoskeletal: Positive for arthralgias (right ankle and foot).  Skin:       Tinea unguium left great toenail medially  Allergic/Immunologic: Negative.   Neurological: Negative.   Hematological: Negative.   Psychiatric/Behavioral: Negative.     Vitals:   10/10/16 1417  BP: (!) 154/86  Pulse: 79  Temp: 97.5 F (36.4 C)  TempSrc: Oral  SpO2: 96%  Weight: 216 lb (98 kg)  Height: _0  (1.778 m)   Body mass index is 30.99 kg/m. Filed Weights   10/10/16 1417  Weight: 216 lb (98 kg)     Physical Exam  Constitutional: He is oriented to person, place, and time. He appears well-developed and well-nourished. No distress.  HENT:  Head: Normocephalic and atraumatic.  Right Ear: External ear normal.  Left Ear: External ear normal.  Nose: Nose normal.  Mouth/Throat: Oropharynx is clear and moist. No oropharyngeal exudate.  Eyes: Conjunctivae and EOM are normal. Pupils are equal, round, and reactive to light.  Corrective lenses  Neck: Neck supple. No thyromegaly present.  hoarse  Cardiovascular: Normal rate, regular rhythm, normal heart sounds and intact distal pulses.  Exam reveals no gallop and no friction rub.   No murmur  heard. Pulmonary/Chest: Effort normal and breath sounds normal. No stridor. No respiratory distress. He has no wheezes. He has no rales. He exhibits no tenderness.  Chronic hoarseness  Abdominal: He exhibits no distension and no mass. There is no tenderness.  Musculoskeletal: Normal range of motion. He exhibits no edema or tenderness.       Right ankle: He exhibits normal range of motion, no swelling and no  ecchymosis. No tenderness.       Right foot: There is normal range of motion, no tenderness, no bony tenderness, no swelling and normal capillary refill.  Lymphadenopathy:    He has no cervical adenopathy.  Neurological: He is alert and oriented to person, place, and time. He has normal reflexes. No cranial nerve deficit. Coordination normal.  Skin: No rash noted. No erythema. No pallor.  Tinea unguium left great toenail medially  Psychiatric: He has a normal mood and affect. His behavior is normal. Thought content normal.    Labs reviewed: Lab Summary Latest Ref Rng & Units 10/08/2016 03/23/2016 02/09/2016  Hemoglobin 13.0 - 17.0 g/dL (None) (None) 16.8  Hematocrit 39.0 - 52.0 % (None) (None) 47.7  White count 4.0 - 10.5 K/uL (None) (None) 9.2  Platelet count 150 - 400 K/uL (None) (None) 263  Sodium 135 - 146 mmol/L 137 141 141  Potassium 3.5 - 5.3 mmol/L 4.7 4.7 4.2  Calcium 8.6 - 10.3 mg/dL 9.4 9.1 9.6  Phosphorus - (None) (None) (None)  Creatinine 0.70 - 1.25 mg/dL 1.06 0.86 1.13  AST 10 - 35 U/L 27 (None) (None)  Alk Phos 40 - 115 U/L 86 (None) (None)  Bilirubin 0.2 - 1.2 mg/dL 0.7 (None) (None)  Glucose 65 - 99 mg/dL 98 88 130(H)  Cholesterol 125 - 200 mg/dL 242(H) (None) (None)  HDL cholesterol >=40 mg/dL 48 44 (None)  Triglycerides <150 mg/dL 172(H) 228(H) (None)  LDL Direct - (None) (None) (None)  LDL Calc <130 mg/dL 160(H) 114(H) (None)  Total protein 6.1 - 8.1 g/dL 6.7 (None) (None)  Albumin 3.6 - 5.1 g/dL 4.3 (None) (None)  Some recent data might be hidden   Lab  Results  Component Value Date   BUN 15 10/08/2016   Lab Results  Component Value Date   HGBA1C 5.5 09/12/2015   10/10/16 EKG: rate 84. NSR with PVC.  Assessment/Plan  1. Hypertension, essential -continue current medication  2. Essential hypertension  3. Hyperglycemia controlled  4. Hyperlipidemia, unspecified hyperlipidemia type Will attempt to lose weight  5. Class 1 obesity due to excess calories without serious comorbidity with body mass index (BMI) of 30.0 to 30.9 in adult Discussed diet and weight loss

## 2016-10-16 ENCOUNTER — Ambulatory Visit: Payer: Self-pay

## 2016-11-05 ENCOUNTER — Telehealth: Payer: Self-pay | Admitting: *Deleted

## 2016-11-05 MED ORDER — DOXYCYCLINE HYCLATE 100 MG PO TABS: 100.0000 mg | ORAL_TABLET | Freq: Two times a day (BID) | ORAL | 0 refills | Status: DC

## 2016-11-05 NOTE — Telephone Encounter (Signed)
Patient called and stated that he has had a sinus infection/drainage for over a week now and has tried OTC Allergy medication and Mucinex with no relief. Worse at night. Please Advise.

## 2016-11-05 NOTE — Telephone Encounter (Signed)
Patient notified and Rx faxed to pharmacy.  

## 2016-11-05 NOTE — Telephone Encounter (Signed)
Call  In prescriytion for Doxycycline 100 mg (20 tabs) One twice daily to treat infection.

## 2016-11-12 ENCOUNTER — Other Ambulatory Visit: Payer: Self-pay | Admitting: Internal Medicine

## 2016-11-13 ENCOUNTER — Telehealth: Payer: Self-pay | Admitting: *Deleted

## 2016-11-13 NOTE — Telephone Encounter (Signed)
Patient called and was wondering if there is something he can take for cough and Wheezing at bedtime. He stated that the drainage is making him cough and wheeze. Has tried OTC Allergy Relief Loratadine tablets. Has had flu shot. And has tried Mucinex. Please Advise.

## 2016-11-13 NOTE — Telephone Encounter (Signed)
Patient notified and agreed.  

## 2016-11-13 NOTE — Telephone Encounter (Signed)
OTC products include: Nasal steroid such as Flonase to reduce drainage Cetirizine 10 mg daily for antihistamine, and  Mucinex D to help keep drainage loose and to open air passages (may have to ask the druggist since it is usually kept behind the counter)

## 2016-12-06 ENCOUNTER — Other Ambulatory Visit: Payer: Self-pay

## 2016-12-06 MED ORDER — ZOLPIDEM TARTRATE 10 MG PO TABS: ORAL_TABLET | ORAL | 1 refills | Status: DC

## 2016-12-16 ENCOUNTER — Other Ambulatory Visit: Payer: Self-pay | Admitting: Internal Medicine

## 2017-01-04 ENCOUNTER — Other Ambulatory Visit: Payer: Self-pay | Admitting: Internal Medicine

## 2017-01-10 ENCOUNTER — Ambulatory Visit (INDEPENDENT_AMBULATORY_CARE_PROVIDER_SITE_OTHER): Payer: Medicare HMO | Admitting: Internal Medicine

## 2017-01-10 ENCOUNTER — Encounter: Payer: Self-pay | Admitting: Internal Medicine

## 2017-01-10 VITALS — BP 140/80 | HR 94 | Temp 97.8°F | Wt 215.0 lb

## 2017-01-10 DIAGNOSIS — J0100 Acute maxillary sinusitis, unspecified: Secondary | ICD-10-CM

## 2017-01-10 MED ORDER — AMOXICILLIN-POT CLAVULANATE 875-125 MG PO TABS: 1.0000 | ORAL_TABLET | Freq: Two times a day (BID) | ORAL | 0 refills | Status: DC

## 2017-01-10 NOTE — Patient Instructions (Signed)
Please try using a nettipot or bottle with saline to flush your sinuses. Also, you may use warm humidity or a vaporizer to help loosen your congestion. Continue the flonase and mucinex-D. I have put you on a course of Augmentin that I sent to your pharmacy.  Eat yogurt daily or every other day at least to help keep your bowels straight.

## 2017-01-10 NOTE — Progress Notes (Signed)
Location:  [facility] clinic Provider: Ahaan Zobrist L. Mariea Clonts, D.O., C.M.D. PCP:  Dr. Nyoka Cowden  Goals of Care:  Advanced Directives 10/10/2016  Does Patient Have a Medical Advance Directive? Yes  Type of Advance Directive Living will  Copy of [place] in Chart? Yes  Would patient like information on creating a medical advance directive? -   Chief Complaint  Patient presents with  . Acute Visit    cough, congestion    HPI: Patient is a 67 y.o. Foster seen today for an acute visit for Cough and congestion.  He's used a couple hundred dollars worse of mucinex-D.  He is waking up congested in his chest.  He is able to bring up some phlegm.  Had drainage from his head.  Takes the mucinex-D morning and evening which does relieve him a little.  Light green or yellow mucus.  He did take antibiotics that he completed the course of a few weeks ago--doxycycline 100mg  po bid.  He also flonase nasal spray and generic zyrtec.  He takes those at night due to drowsiness it causes.  Is better than he was but he's waking the dog up coughing.  He actually had sinus surgery.  He even had flushing by a doctor at 49.    Past Medical History:  Diagnosis Date  . Backache, unspecified   . Bronchitis   . Genital herpes   . Gout   . Hearing loss   . Hemorrhoids   . Hernia   . Herpes simplex   . Hyperglycemia   . Hyperlipidemia   . Hypertension   . Impotence   . Insomnia, unspecified   . Osteoarthrosis, unspecified whether generalized or localized, unspecified site   . Tinea pedis, right   . Unspecified disorder of Foster genital organs     Past Surgical History:  Procedure Laterality Date  . fracture left femur  1984  . ganglion repair of left forarm  2000  . IVP  29   ML old comp fracture  . NASAL SEPTUM SURGERY  1997  . TONSILLECTOMY  1959  . UMBILICAL HERNIA REPAIR  2002    No Known Allergies  Allergies as of 01/10/2017   No Known Allergies     Medication List       Accurate  as of 01/10/17 10:54 AM. Always use your most recent med list.          acyclovir 400 MG tablet Commonly known as:  ZOVIRAX TAKE 1 TO PREVENT HERPES AND TAKE 5 TABLETS PER DAY FOR ACTIVE HERPES   amLODipine 5 MG tablet Commonly known as:  NORVASC TAKE 1 TABLET BY MOUTH EVERY DAY TO CONTROL BLOOD PRESSURE   aspirin EC 81 MG tablet Take 81 mg by mouth daily.   febuxostat 40 MG tablet Commonly known as:  ULORIC Take 40 mg by mouth daily.   ibuprofen 400 MG tablet Commonly known as:  ADVIL,MOTRIN Take 1 tablet (400 mg total) by mouth every 6 (six) hours as needed.   losartan 100 MG tablet Commonly known as:  COZAAR TAKE 1 TABLET BY MOUTH EVERY DAY TO CONTROL BLOOD PRESSURE   OVER THE COUNTER MEDICATION Take 1 tablet by mouth daily. ZEAL   tadalafil 20 MG tablet Commonly known as:  CIALIS Take 1 tablet (20 mg total) by mouth daily as needed for erectile dysfunction.   zolpidem 10 MG tablet Commonly known as:  AMBIEN Take one tablet by mouth at bedtime as needed for sleep  Review of Systems:  Review of Systems  Constitutional: Negative for chills and fever.  HENT: Positive for congestion and sinus pain. Negative for sore throat.   Eyes: Negative for redness.  Respiratory: Positive for cough and sputum production. Negative for shortness of breath.        Yellow green draining from sinus  Cardiovascular: Negative for chest pain.  Gastrointestinal: Negative for diarrhea, nausea and vomiting.  Neurological: Negative for headaches.    Health Maintenance  Topic Date Due  . Hepatitis C Screening  [birth year]  . COLONOSCOPY  12/31/2006  . ZOSTAVAX  12/07/2008  . PNA vac Low Risk Adult (1 of 2 - PCV13) 12/07/2013  . TETANUS/TDAP  12/04/2023  . INFLUENZA VACCINE  Completed    Physical Exam: Vitals:   01/10/17 1033  BP: 140/80  Pulse: 94  Temp: 97.8 F (Rodney.6 C)  TempSrc: Oral  SpO2: 97%  Weight: 215 lb (97.5 kg)   Body mass index is 30.85 kg/m. Physical  Exam  Constitutional: He is oriented to person, place, and time. He appears well-developed and well-nourished.  HENT:  Sinus tenderness maxillary, postnasal drip  Eyes: Conjunctivae are normal.  Neck: Neck supple.  Cardiovascular: Normal rate, regular rhythm and normal heart sounds.   Pulmonary/Chest: Effort normal and breath sounds normal. No respiratory distress. He has no wheezes. He has no rales.  Lymphadenopathy:    He has no cervical adenopathy.  Neurological: He is alert and oriented to person, place, and time.    Labs reviewed: Basic Metabolic Panel:  Recent Labs  02/09/16 1140 03/23/16 0803 10/08/16 0814  NA 141 141 137  K 4.2 4.7 4.7  CL 106 103 100  CO2 24 22 27   GLUCOSE 130* 88 98  BUN 11 11 15   CREATININE 1.13 0.86 1.06  CALCIUM 9.6 9.1 9.4   Liver Function Tests:  Recent Labs  10/08/16 0814  AST 27  ALT 37  ALKPHOS 86  BILITOT 0.7  PROT 6.7  ALBUMIN 4.3   No results for input(s): LIPASE, AMYLASE in the last 8760 hours. No results for input(s): AMMONIA in the last 8760 hours. CBC:  Recent Labs  02/09/16 1140  WBC 9.2  HGB 16.8  HCT 47.7  MCV 94.6  PLT 263   Lipid Panel:  Recent Labs  03/23/16 0803 10/08/16 0814  CHOL 204* 242*  HDL 44 48  LDLCALC 114* 160*  TRIG 228* 172*  CHOLHDL 4.6 5.0   Lab Results  Component Value Date   HGBA1C 5.5 09/12/2015    Assessment/Plan 1. Subacute maxillary sinusitis -has been going on several weeks and has some baseline allergies and chronic sinusitis, prior surgery -still has drainage with yellow/green sputum and ongoing cough at hs -due to weeks of symptoms and lack of resolution post-doxy, will tx with augmentin this time to ensure we get anaerobic coverage -also encouraged use of nettipot--given bottle version  Also asked for uloric sample for his gout until he gets the mail order from [facility].  Labs/tests ordered:  No orders of the defined types were placed in this encounter.  Next  appt:  04/10/2017--keep regular appt and return prn not improving  Yariana Hoaglund L. Wyley Hack, D.O. [facility] [address], [place] [address] Cell Phone (Mon-Fri 8am-5pm):  [phone] On Call:  [phone] & follow prompts after 5pm & weekends Office Phone:  [phone] Office Fax:  [phone]

## 2017-01-23 ENCOUNTER — Other Ambulatory Visit: Payer: Self-pay | Admitting: *Deleted

## 2017-01-23 MED ORDER — ZOLPIDEM TARTRATE 10 MG PO TABS: ORAL_TABLET | ORAL | 1 refills | Status: DC

## 2017-01-23 NOTE — Telephone Encounter (Signed)
Patient requested. Phoned into pharmacy.

## 2017-02-05 ENCOUNTER — Other Ambulatory Visit: Payer: Self-pay | Admitting: *Deleted

## 2017-02-05 MED ORDER — FEBUXOSTAT 40 MG PO TABS: 40.0000 mg | ORAL_TABLET | Freq: Every day | ORAL | 1 refills | Status: DC

## 2017-02-05 NOTE — Telephone Encounter (Signed)
Patient wants Rx sent to [facility] Fax: [phone]

## 2017-03-20 ENCOUNTER — Telehealth: Payer: Self-pay | Admitting: *Deleted

## 2017-03-20 ENCOUNTER — Telehealth: Payer: Self-pay | Admitting: Internal Medicine

## 2017-03-20 MED ORDER — ZOLPIDEM TARTRATE 10 MG PO TABS: ORAL_TABLET | ORAL | 1 refills | Status: DC

## 2017-03-20 NOTE — Telephone Encounter (Signed)
Patient notified

## 2017-03-20 NOTE — Telephone Encounter (Signed)
Refill zolpidem. Try Fast Max decongestant for chest congestion.

## 2017-03-20 NOTE — Telephone Encounter (Signed)
left msg asking pt to call and schedule his AWV (initial) that's due now. VDM (DD)

## 2017-03-20 NOTE — Telephone Encounter (Signed)
Patient called and requested refill on his Zolpidem. Called and spoke with Pharmacist at [facility].   Patient also wants to know what he Nikeria Kalman take for Chest Congestion. Stated that he has had it for over 5 months now, ever since he got the flu shot and can't get rid of it. Stated that he has used Chief Technology Officer with no relief. Patient is going to the [employer] for work this weekend and won't be back until Sunday. Leaving today. Please Advise.

## 2017-03-26 ENCOUNTER — Telehealth: Payer: Self-pay | Admitting: Internal Medicine

## 2017-03-26 NOTE — Telephone Encounter (Signed)
Patient notified and agreed.  

## 2017-03-26 NOTE — Telephone Encounter (Signed)
Try Flonase or similar OTC steroid nasal spray. Delsym is available OTC for the cough.

## 2017-03-26 NOTE — Telephone Encounter (Signed)
Patient called stating that the advice Rodena Piety gave about taking the congestion medication (Fast max)  the other day has helped him but now he is having so much sinus drainage that it is waking him up at 4am every morning, coughing. Patient wants to know what he needs to do to get his sinuses and coughing under control.   Please advise.

## 2017-04-01 ENCOUNTER — Telehealth: Payer: Self-pay

## 2017-04-01 DIAGNOSIS — R053 Chronic cough: Secondary | ICD-10-CM

## 2017-04-01 DIAGNOSIS — J42 Unspecified chronic bronchitis: Secondary | ICD-10-CM

## 2017-04-01 DIAGNOSIS — R05 Cough: Secondary | ICD-10-CM

## 2017-04-01 NOTE — Telephone Encounter (Signed)
Patient is requesting referral to respiratory specialist, he states that he has been coughing all winter and has a lot of congestion in his chest, coughing goes on all night.

## 2017-04-01 NOTE — Telephone Encounter (Signed)
Refer to [facility] 

## 2017-04-02 NOTE — Telephone Encounter (Signed)
Persistent cough and chronic bronchitis.

## 2017-04-02 NOTE — Telephone Encounter (Signed)
Referral sent 

## 2017-04-02 NOTE — Telephone Encounter (Signed)
What diagnoses would you like for me to use? 

## 2017-04-10 ENCOUNTER — Encounter: Payer: Self-pay | Admitting: Internal Medicine

## 2017-04-10 ENCOUNTER — Ambulatory Visit (INDEPENDENT_AMBULATORY_CARE_PROVIDER_SITE_OTHER): Payer: Medicare HMO | Admitting: Internal Medicine

## 2017-04-10 DIAGNOSIS — J410 Simple chronic bronchitis: Secondary | ICD-10-CM

## 2017-04-10 DIAGNOSIS — J449 Chronic obstructive pulmonary disease, unspecified: Secondary | ICD-10-CM | POA: Insufficient documentation

## 2017-04-10 DIAGNOSIS — R0981 Nasal congestion: Secondary | ICD-10-CM | POA: Diagnosis not present

## 2017-04-10 DIAGNOSIS — J42 Unspecified chronic bronchitis: Secondary | ICD-10-CM | POA: Insufficient documentation

## 2017-04-10 DIAGNOSIS — J209 Acute bronchitis, unspecified: Secondary | ICD-10-CM | POA: Insufficient documentation

## 2017-04-10 MED ORDER — SODIUM CHLORIDE-SODIUM BICARB 2300-700 MG NA KIT: PACK | NASAL | 5 refills | Status: DC

## 2017-04-10 MED ORDER — FLUTICASONE FUROATE-VILANTEROL 100-25 MCG/INH IN AEPB
INHALATION_SPRAY | RESPIRATORY_TRACT | 4 refills | Status: DC
Start: 2017-04-10 — End: 2017-05-03

## 2017-04-10 NOTE — Progress Notes (Signed)
Facility  [facility]    Place of Service:   OFFICE    No Known Allergies  Chief Complaint  Patient presents with  . Medical Management of Chronic Issues    6 month medication management blood pressure, cholesterol, hyperglycemia.  . chest congestion    at night ever since he took Flu shot. Using Flonase nasal spray, Cetirizine 56m , Mucinex D. Has appt with [facility] 05/03/17.    HPI:  Sinus congestion. Tried Fast Max. Chronic bronchitis persists. Referred to [facility]. He will see Dr. WMelvyn Novason 05/03/17. Long history of allergies.  Chest rattling. No wheeze.No fever.  Insomnia. Using zolpidem.  Medications: Patient's Medications  New Prescriptions   No medications on file  Previous Medications   ACYCLOVIR (ZOVIRAX) 400 MG TABLET    TAKE 1 TO PREVENT HERPES AND TAKE 5 TABLETS PER DAY FOR ACTIVE HERPES   AMLODIPINE (NORVASC) 5 MG TABLET    TAKE 1 TABLET BY MOUTH EVERY DAY TO CONTROL BLOOD PRESSURE   ASPIRIN EC 81 MG TABLET    Take 81 mg by mouth daily.   CETIRIZINE (ZYRTEC) 10 MG TABLET    Take 10 mg by mouth daily.   FEBUXOSTAT (ULORIC) 40 MG TABLET    Take 1 tablet (40 mg total) by mouth daily.   FLUTICASONE (FLONASE) 50 MCG/ACT NASAL SPRAY    Place into both nostrils. One spray both nostrils at bedtime   GUAIFENESIN (ROBITUSSIN) 100 MG/5ML LIQUID    Take 200 mg by mouth. Take one cup each night before bedtime   IBUPROFEN (ADVIL,MOTRIN) 400 MG TABLET    Take 1 tablet (400 mg total) by mouth every 6 (six) hours as needed.   LOSARTAN (COZAAR) 100 MG TABLET    TAKE 1 TABLET BY MOUTH EVERY DAY TO CONTROL BLOOD PRESSURE   OVER THE COUNTER MEDICATION    Take 1 tablet by mouth daily. ZEAL   PSEUDOEPHEDRINE-GUAIFENESIN (MUCINEX D PO)    Take by mouth. Take one tablet in evening   TADALAFIL (CIALIS) 20 MG TABLET    Take 1 tablet (20 mg total) by mouth daily as needed for erectile dysfunction.   ZOLPIDEM (AMBIEN) 10 MG TABLET    Take one tablet by mouth at bedtime as needed  for sleep  Modified Medications   No medications on file  Discontinued Medications   AMOXICILLIN-CLAVULANATE (AUGMENTIN) 875-125 MG TABLET    Take 1 tablet by mouth 2 (two) times daily.    Review of Systems  Constitutional: Negative for activity change, appetite change, chills, diaphoresis, fatigue, fever and unexpected weight change.       Intentional weight loss  HENT: Positive for postnasal drip, rhinorrhea, sinus pressure and sneezing. Negative for congestion, ear pain, hearing loss, sore throat, tinnitus, trouble swallowing and voice change.   Eyes: Positive for visual disturbance (corrective lenses).       Spots dancing before his eyes from time to time.  Respiratory: Positive for cough and chest tightness. Negative for choking, shortness of breath and wheezing.        Chronically hoarse  Cardiovascular: Negative for chest pain, palpitations and leg swelling.  Gastrointestinal: Negative for abdominal distention, abdominal pain, constipation, diarrhea, nausea and rectal pain.  Endocrine: Negative.  Negative for cold intolerance, heat intolerance, polydipsia, polyphagia and polyuria.  Genitourinary: Negative.  Negative for dysuria, frequency, testicular pain and urgency.       Impotent. Cialis helps. Burning across scrotal area.  Musculoskeletal: Positive for arthralgias (right ankle and  foot). Negative for back pain, gait problem, myalgias and neck pain.  Skin: Negative for color change, pallor and rash.       Tinea unguium left great toenail medially  Allergic/Immunologic: Negative.   Neurological: Negative.  Negative for dizziness, tremors, syncope, speech difficulty, weakness, numbness and headaches.  Hematological: Negative.  Negative for adenopathy. Does not bruise/bleed easily.  Psychiatric/Behavioral: Negative.  Negative for behavioral problems, confusion, decreased concentration, hallucinations and sleep disturbance. The patient is not nervous/anxious.     Vitals:    04/10/17 1058  BP: 134/84  Pulse: 79  Temp: 97.7 F (36.5 C)  TempSrc: Oral  SpO2: 95%  Weight: 216 lb (98 kg)  Height: _0  (1.778 m)   Body mass index is 30.99 kg/m. Wt Readings from Last 3 Encounters:  04/10/17 216 lb (98 kg)  01/10/17 215 lb (97.5 kg)  10/10/16 216 lb (98 kg)      Physical Exam  Constitutional: He is oriented to person, place, and time. He appears well-developed and well-nourished. No distress.  HENT:  Head: Normocephalic and atraumatic.  Right Ear: External ear normal.  Left Ear: External ear normal.  Nose: Nose normal.  Mouth/Throat: Oropharynx is clear and moist. No oropharyngeal exudate.  Eyes: Conjunctivae and EOM are normal. Pupils are equal, round, and reactive to light.  Corrective lenses  Neck: Neck supple. No thyromegaly present.  hoarse  Cardiovascular: Normal rate, regular rhythm, normal heart sounds and intact distal pulses.  Exam reveals no gallop and no friction rub.   No murmur heard. Pulmonary/Chest: Effort normal. No stridor. No respiratory distress. He has no wheezes. He has rales (bronchial rattle). He exhibits no tenderness.  Chronic hoarseness  Abdominal: He exhibits no distension and no mass. There is no tenderness.  Musculoskeletal: Normal range of motion. He exhibits no edema or tenderness.       Right ankle: He exhibits normal range of motion, no swelling and no ecchymosis. No tenderness.       Right foot: There is normal range of motion, no tenderness, no bony tenderness, no swelling and normal capillary refill.  Lymphadenopathy:    He has no cervical adenopathy.  Neurological: He is alert and oriented to person, place, and time. He has normal reflexes. No cranial nerve deficit. Coordination normal.  Skin: No rash noted. No erythema. No pallor.  Tinea unguium left great toenail medially  Psychiatric: He has a normal mood and affect. His behavior is normal. Thought content normal.    Labs reviewed: Lab Summary Latest  Ref Rng & Units 10/08/2016 03/23/2016 02/09/2016  Hemoglobin 13.0 - 17.0 g/dL (None) (None) 16.8  Hematocrit 39.0 - 52.0 % (None) (None) 47.7  White count 4.0 - 10.5 K/uL (None) (None) 9.2  Platelet count 150 - 400 K/uL (None) (None) 263  Sodium 135 - 146 mmol/L 137 141 141  Potassium 3.5 - 5.3 mmol/L 4.7 4.7 4.2  Calcium 8.6 - 10.3 mg/dL 9.4 9.1 9.6  Phosphorus - (None) (None) (None)  Creatinine 0.70 - 1.25 mg/dL 1.06 0.86 1.13  AST 10 - 35 U/L 27 (None) (None)  Alk Phos 40 - 115 U/L 86 (None) (None)  Bilirubin 0.2 - 1.2 mg/dL 0.7 (None) (None)  Glucose 65 - 99 mg/dL 98 88 130(H)  Cholesterol 125 - 200 mg/dL 242(H) (None) (None)  HDL cholesterol >=40 mg/dL 48 44 (None)  Triglycerides <150 mg/dL 172(H) 228(H) (None)  LDL Direct - (None) (None) (None)  LDL Calc <130 mg/dL 160(H) 114(H) (None)  Total protein 6.1 -  8.1 g/dL 6.7 (None) (None)  Albumin 3.6 - 5.1 g/dL 4.3 (None) (None)  Some recent data might be hidden   No results found for: TSH, T3TOTAL, T4TOTAL, THYROIDAB Lab Results  Component Value Date   BUN 15 10/08/2016   BUN 11 03/23/2016   BUN 11 02/09/2016   Lab Results  Component Value Date   HGBA1C 5.5 09/12/2015    Assessment/Plan  1. Sinus congestion - Mucinex D - Sodium Chloride-Sodium Bicarb (NETI POT SINUS WASH) 2300-700 MG KIT; Use daily in the Neti pot or sinus rinse  Dispense: 30 each; Refill: 5  2. Simple chronic bronchitis (HCC) - fluticasone furoate-vilanterol (BREO ELLIPTA) 100-25 MCG/INH AEPB; Inhale into the lungs once daily  Dispense: 1 each; Refill: 4  See Dr. Melvyn Novas as anticipated.

## 2017-05-03 ENCOUNTER — Encounter: Payer: Self-pay | Admitting: Internal Medicine

## 2017-05-03 ENCOUNTER — Ambulatory Visit (INDEPENDENT_AMBULATORY_CARE_PROVIDER_SITE_OTHER): Payer: Medicare HMO | Admitting: Internal Medicine

## 2017-05-03 ENCOUNTER — Ambulatory Visit (INDEPENDENT_AMBULATORY_CARE_PROVIDER_SITE_OTHER)
Admission: RE | Admit: 2017-05-03 | Discharge: 2017-05-03 | Disposition: A | Discharge status: Home / Self Care | Payer: Medicare HMO | Source: Ambulatory Visit | Attending: Internal Medicine | Admitting: Internal Medicine

## 2017-05-03 ENCOUNTER — Other Ambulatory Visit (INDEPENDENT_AMBULATORY_CARE_PROVIDER_SITE_OTHER): Payer: Medicare HMO

## 2017-05-03 VITALS — BP 140/90 | HR 96 | Ht 70.0 in | Wt 214.0 lb

## 2017-05-03 DIAGNOSIS — J45991 Cough variant asthma: Secondary | ICD-10-CM

## 2017-05-03 DIAGNOSIS — R938 Abnormal findings on diagnostic imaging of other specified body structures: Secondary | ICD-10-CM | POA: Diagnosis not present

## 2017-05-03 DIAGNOSIS — R9389 Abnormal findings on diagnostic imaging of other specified body structures: Secondary | ICD-10-CM | POA: Insufficient documentation

## 2017-05-03 DIAGNOSIS — R05 Cough: Secondary | ICD-10-CM | POA: Diagnosis not present

## 2017-05-03 HISTORY — DX: Cough variant asthma: J45.991

## 2017-05-03 LAB — CBC WITH DIFFERENTIAL/PLATELET
Basophils Absolute: 0 10*3/uL (ref 0.0–0.1)
Basophils Relative: 0.4 % (ref 0.0–3.0)
EOS PCT: 1.4 % (ref 0.0–5.0)
Eosinophils Absolute: 0.1 10*3/uL (ref 0.0–0.7)
HCT: 47.5 % (ref 39.0–52.0)
Hemoglobin: 16.6 g/dL (ref 13.0–17.0)
LYMPHS ABS: 3.7 10*3/uL (ref 0.7–4.0)
Lymphocytes Relative: 40.6 % (ref 12.0–46.0)
MCHC: 35 g/dL (ref 30.0–36.0)
MCV: 95.7 fl (ref 78.0–100.0)
MONO ABS: 1 10*3/uL (ref 0.1–1.0)
Monocytes Relative: 11.4 % (ref 3.0–12.0)
NEUTROS ABS: 4.2 10*3/uL (ref 1.4–7.7)
NEUTROS PCT: 46.2 % (ref 43.0–77.0)
Platelets: 246 10*3/uL (ref 150.0–400.0)
RBC: 4.96 Mil/uL (ref 4.22–5.81)
RDW: 12.9 % (ref 11.5–15.5)
WBC: 9 10*3/uL (ref 4.0–10.5)

## 2017-05-03 MED ORDER — BUDESONIDE-FORMOTEROL FUMARATE 80-4.5 MCG/ACT IN AERO: 2.0000 | INHALATION_SPRAY | Freq: Two times a day (BID) | RESPIRATORY_TRACT | 0 refills | Status: DC

## 2017-05-03 MED ORDER — PANTOPRAZOLE SODIUM 40 MG PO TBEC: 40.0000 mg | DELAYED_RELEASE_TABLET | Freq: Every day | ORAL | 2 refills | Status: DC

## 2017-05-03 MED ORDER — FAMOTIDINE 20 MG PO TABS: ORAL_TABLET | ORAL | 2 refills | Status: DC

## 2017-05-03 NOTE — Progress Notes (Signed)
Subjective:     Patient ID: Rodney Foster, male   DOB: 10/14/1950,    MRN: [id number]  HPI  19 yowm quit  Smoking 1985 with h/o sinus problems all his life typical flares requiring intermittent abx then sinus surgery in [facility] in the 1980s well helped the obstructive symptoms but not the sensation of drainage and flared p flu shot in Oct 2017 and much worse and assoc with chest congestion esp at hs so  referred to pulmonary clinic 05/03/2017 by Dr   Nyoka Cowden    05/03/2017 1st [facility] office visit/ Wert   Chief Complaint  Patient presents with  . Pulmonary Consult    Referred by Dr. Jeanmarie Hubert. Pt c/o chest congestion and cough since Oct 2017. He states that he wakes up in the early AM "clogged up".  His cough is occ prod with "not clear" sputum.   since abrupt onset of symptoms p flu shot fall 2017  minimal improvement with abx x 3/ BREO no better    Much better during the day mucus is white / symptoms peak around 5 am    Not limited by breathing from desired activities    No obvious other patterns in  day to day or daytime variability or assoc excess/ purulent sputum or mucus plugs or hemoptysis or cp or chest tightness, subjective wheeze or overt sinus or hb symptoms. No unusual exp hx or h/o childhood pna/ asthma or knowledge of premature birth.  Sleeping ok without nocturnal  or early am exacerbation  of respiratory  c/o's or need for noct saba. Also denies any obvious fluctuation of symptoms with weather or environmental changes or other aggravating or alleviating factors except as outlined above   Current Medications, Allergies, Complete Past Medical History, Past Surgical History, Family History, and Social History were reviewed in [employer] record.  ROS  The following are not active complaints unless bolded sore throat, dysphagia, dental problems, itching, sneezing,  nasal congestion or excess  secretions, ear ache,   fever, chills, sweats,  unintended wt loss, classically pleuritic or exertional cp,  orthopnea pnd or leg swelling, presyncope, palpitations, abdominal pain, anorexia, nausea, vomiting, diarrhea  or change in bowel or bladder habits, change in stools or urine, dysuria,hematuria,  rash, arthralgias, visual complaints, headache, numbness, weakness or ataxia or problems with walking or coordination,  change in mood/affect or memory.            Review of Systems     Objective:   Physical Exam    amb wm nad   Wt Readings from Last 3 Encounters:  05/03/17 214 lb (97.1 kg)  04/10/17 216 lb (98 kg)  01/10/17 215 lb (97.5 kg)    Vital signs reviewed - Note on arrival 02 sats  96% on RA       HEENT: nl dentition,  and oropharynx. Nl external ear canals without cough reflex - moderate bilateral non-specific turbinate edema    NECK :  without JVD/Nodes/TM/ nl carotid upstrokes bilaterally   LUNGS: no acc muscle use,  Nl contour chest  With junky insp and exp rhonchi bilaterally    CV:  RRR  no s3 or murmur or increase in P2, and no edema   ABD:  Obese/ soft and nontender with nl inspiratory excursion in the supine position. No bruits or organomegaly appreciated, bowel sounds nl  MS:  Nl gait/ ext warm without deformities, calf tenderness, cyanosis or clubbing No obvious joint restrictions  SKIN: warm and dry without lesions    NEURO:  alert, approp, nl sensorium with  no motor or cerebellar deficits apparent.    CXR PA and Lateral:   05/03/2017 :    I personally reviewed images and agree with radiology impression as follows:   Chronic atelectasis or scarring at the left base. No acute findings.    Labs ordered 05/03/2017  Allergy profile      Assessment:

## 2017-05-03 NOTE — Patient Instructions (Addendum)
symbicort 80 Take 2 puffs first thing in am and then another 2 puffs about 12 hours later.   Pantoprazole (protonix) 40 mg   Take  30-60 min before first meal of the day and Pepcid (famotidine)  20 mg one @  bedtime until return to office - this is the best way to tell whether stomach acid is contributing to your problem.     GERD (REFLUX)  is an extremely common cause of respiratory symptoms just like yours , many times with no obvious heartburn at all.    It can be treated with medication, but also with lifestyle changes including elevation of the head of your bed (ideally with 6 inch  bed blocks),  Smoking cessation, avoidance of late meals, excessive alcohol, and avoid fatty foods, chocolate, peppermint, colas, red wine, and acidic juices such as orange juice.  NO MINT OR MENTHOL PRODUCTS SO NO COUGH DROPS   USE SUGARLESS CANDY INSTEAD (Jolley ranchers or Stover's or Life Savers) or even ice chips will also do - the key is to swallow to prevent all throat clearing. NO OIL BASED VITAMINS - use powdered substitutes.   For cough / congestion > only use the mucinex d as needed  Please see patient coordinator before you leave today  to schedule sinus CT  Please remember to go to the lab and x-ray department downstairs in the basement  for your tests - we will call you with the results when they are available.  Please schedule a follow up office visit in 2 weeks, sooner if needed  with all medications /inhalers/ solutions in hand so we can verify exactly what you are taking. This includes all medications from all doctors and over the counters - nees repeat cxr on return

## 2017-05-03 NOTE — Assessment & Plan Note (Signed)
05/03/2017  After extensive coaching HFA effectiveness =    75% > try symbicort 80 2bid  - Allergy profile 05/03/2017 >  Eos 0. /  IgE     Not clear whether he truly has primary airways dz or even copd/ab from smoking hx but I'm much more impressed with the sinus symptoms historically which suggest a sinobronchial syndrome which needs w/u with allergy eval/ sinus CT   For now rec trial of symbicort 80 2bid and rx for gerd (secondary to chronic cough) which awaiting the above studies  - The proper method of use, as well as anticipated side effects, of a metered-dose inhaler are discussed and demonstrated to the patient. Improved effectiveness after extensive coaching during this visit to a level of approximately 50 % from a baseline of 75 % > ok for hfa  Total time devoted to counseling  > 50 % of initial 60 min office visit:  review case with pt/ discussion of options/alternatives/ personally creating written customized instructions  in presence of pt  then going over those specific  Instructions directly with the pt including how to use all of the meds but in particular covering each new medication in detail and the difference between the maintenance= "automatic" meds and the prns using an action plan format for the latter (If this problem/symptom => do that organization reading Left to right).  Please see AVS from this visit for a full list of these instructions which I personally wrote for this pt and  are unique to this visit.

## 2017-05-03 NOTE — Progress Notes (Signed)
Spoke with pt and notified of results per Dr. Wert. Pt verbalized understanding and denied any questions. 

## 2017-05-03 NOTE — Assessment & Plan Note (Signed)
See cxr 05/03/2017 c/w LLL partial atx post basal segment    Definitely will need f/u next ov p first address the sinus/ airway issues which may be the cause of the atx (mucus plugging) - in meantime mucinex products best choice, no further abx for now

## 2017-05-06 LAB — RESPIRATORY ALLERGY PROFILE REGION II ~~LOC~~
Allergen, C. Herbarum, M2: <0.1 kU/L
Allergen, Cedar tree, t12: <0.1 kU/L
Allergen, Comm Silver Birch, t9: <0.1 kU/L
Allergen, Cottonwood, t14: <0.1 kU/L
Allergen, Mouse Urine Protein, e78: <0.1 kU/L
Allergen, Mulberry, t76: <0.1 kU/L
Allergen, P. notatum, m1: <0.1 kU/L
Aspergillus fumigatus, m3: <0.1 kU/L
Box Elder IgE: <0.1 kU/L
Cockroach: <0.1 kU/L
Common Ragweed: <0.1 kU/L
IgE (Immunoglobulin E), Serum: 72 kU/L (ref <115)
Rough Pigweed  IgE: <0.1 kU/L
Sheep Sorrel IgE: <0.1 kU/L

## 2017-05-07 NOTE — Progress Notes (Signed)
Spoke with pt and notified of results per Dr. Wert. Pt verbalized understanding and denied any questions. 

## 2017-05-13 ENCOUNTER — Ambulatory Visit (INDEPENDENT_AMBULATORY_CARE_PROVIDER_SITE_OTHER)
Admission: RE | Admit: 2017-05-13 | Discharge: 2017-05-13 | Disposition: A | Discharge status: Home / Self Care | Payer: Medicare HMO | Source: Ambulatory Visit | Attending: Internal Medicine | Admitting: Internal Medicine

## 2017-05-13 DIAGNOSIS — J45991 Cough variant asthma: Secondary | ICD-10-CM

## 2017-05-13 DIAGNOSIS — J3489 Other specified disorders of nose and nasal sinuses: Secondary | ICD-10-CM | POA: Diagnosis not present

## 2017-05-13 NOTE — Progress Notes (Signed)
LMTCB

## 2017-05-14 ENCOUNTER — Other Ambulatory Visit: Payer: Self-pay | Admitting: *Deleted

## 2017-05-14 MED ORDER — ZOLPIDEM TARTRATE 10 MG PO TABS: ORAL_TABLET | ORAL | 1 refills | Status: DC

## 2017-05-14 NOTE — Telephone Encounter (Signed)
Patient requested Rx to be sent to pharmacy.  

## 2017-05-15 ENCOUNTER — Other Ambulatory Visit: Payer: Self-pay

## 2017-05-15 DIAGNOSIS — R0981 Nasal congestion: Secondary | ICD-10-CM

## 2017-05-15 MED ORDER — AMOXICILLIN-POT CLAVULANATE 875-125 MG PO TABS: 1.0000 | ORAL_TABLET | Freq: Two times a day (BID) | ORAL | 0 refills | Status: DC

## 2017-05-17 ENCOUNTER — Telehealth: Payer: Self-pay | Admitting: Internal Medicine

## 2017-05-17 NOTE — Telephone Encounter (Signed)
left msg asking pt to confirm this AWV-I w/ nurse. VDM (DD)

## 2017-05-23 ENCOUNTER — Ambulatory Visit (INDEPENDENT_AMBULATORY_CARE_PROVIDER_SITE_OTHER): Payer: Medicare HMO | Admitting: Nurse Practitioner

## 2017-05-23 ENCOUNTER — Encounter: Payer: Self-pay | Admitting: Internal Medicine

## 2017-05-23 ENCOUNTER — Encounter: Payer: Self-pay | Admitting: Nurse Practitioner

## 2017-05-23 ENCOUNTER — Ambulatory Visit (INDEPENDENT_AMBULATORY_CARE_PROVIDER_SITE_OTHER): Payer: Medicare HMO | Admitting: Internal Medicine

## 2017-05-23 VITALS — BP 142/80 | HR 90 | Temp 97.6°F | Resp 18 | Ht 70.0 in | Wt 217.8 lb

## 2017-05-23 DIAGNOSIS — R300 Dysuria: Secondary | ICD-10-CM | POA: Diagnosis not present

## 2017-05-23 DIAGNOSIS — R9389 Abnormal findings on diagnostic imaging of other specified body structures: Secondary | ICD-10-CM

## 2017-05-23 DIAGNOSIS — J45991 Cough variant asthma: Secondary | ICD-10-CM | POA: Diagnosis not present

## 2017-05-23 DIAGNOSIS — W57XXXA Bitten or stung by nonvenomous insect and other nonvenomous arthropods, initial encounter: Secondary | ICD-10-CM | POA: Diagnosis not present

## 2017-05-23 DIAGNOSIS — R938 Abnormal findings on diagnostic imaging of other specified body structures: Secondary | ICD-10-CM | POA: Diagnosis not present

## 2017-05-23 LAB — CBC WITH DIFFERENTIAL/PLATELET
BASOS PCT: 1 %
Basophils Absolute: 83 cells/uL (ref 0–200)
EOS ABS: 83 {cells}/uL (ref 15–500)
Eosinophils Relative: 1 %
HEMATOCRIT: 44.9 % (ref 38.5–50.0)
Hemoglobin: 16 g/dL (ref 13.2–17.1)
Lymphocytes Relative: 47 %
Lymphs Abs: 3901 cells/uL — ABNORMAL HIGH (ref 850–3900)
MCH: 33.5 pg — ABNORMAL HIGH (ref 27.0–33.0)
MCHC: 35.6 g/dL (ref 32.0–36.0)
MCV: 93.9 fL (ref 80.0–100.0)
MONO ABS: 830 {cells}/uL (ref 200–950)
MONOS PCT: 10 %
MPV: 10.1 fL (ref 7.5–12.5)
NEUTROS ABS: 3403 {cells}/uL (ref 1500–7800)
Neutrophils Relative %: 41 %
PLATELETS: 288 10*3/uL (ref 140–400)
RBC: 4.78 MIL/uL (ref 4.20–5.80)
RDW: 12.9 % (ref 11.0–15.0)
WBC: 8.3 10*3/uL (ref 3.8–10.8)

## 2017-05-23 MED ORDER — SULFAMETHOXAZOLE-TRIMETHOPRIM 800-160 MG PO TABS: 1.0000 | ORAL_TABLET | Freq: Two times a day (BID) | ORAL | 0 refills | Status: DC

## 2017-05-23 NOTE — Patient Instructions (Signed)
Cont to increase hydration  Watch for fever, chill, rash  To start probiotic twice daily

## 2017-05-23 NOTE — Patient Instructions (Signed)
Use the flutter valve as much as you can  Leave off the symbicort and call if condition worsens  Please see patient coordinator before you leave today  to schedule ENT eval and return here once this evaluation is complete if not all better  Stay on acid suppression and reflux diet as you are until completely better then ok to try off

## 2017-05-23 NOTE — Progress Notes (Signed)
Subjective:     Patient ID: Rodney Foster, male   DOB: [birth year],    MRN: [id number]    Brief patient profile:  36 yowm quit  Smoking 1985 with h/o sinus problems all his life typical flares requiring intermittent abx then sinus surgery in [facility] in the [facility]s well helped the obstructive symptoms but not the sensation of drainage and flared p flu shot in Oct 2017 and much worse and assoc with chest congestion esp at hs so  referred to pulmonary clinic 05/03/2017 by Dr  Art Nyoka Cowden     History of Present Illness  05/03/2017 1st [facility] office visit/ Rodney Foster   Chief Complaint  Patient presents with  . Pulmonary Consult    Referred by Dr. Jeanmarie Hubert. Pt c/o chest congestion and cough since Oct 2017. He states that he wakes up in the early AM "clogged up".  His cough is occ prod with "not clear" sputum.   since abrupt onset of symptoms p flu shot fall 2017  minimal improvement with abx x 3/ BREO no better   Much better during the day mucus is white / symptoms peak around 5 am   rec symbicort 80 Take 2 puffs first thing in am and then another 2 puffs about 12 hours later.  Pantoprazole (protonix) 40 mg   Take  30-60 min before first meal of the day and Pepcid (famotidine)  20 mg one @  bedtime until return to office - this is the best way to tell whether stomach acid is contributing to your problem.   GERD (REFLUX)   For cough / congestion > only use the mucinex d as needed Please see patient coordinator before you leave today  to schedule sinus CT> pos multiple air fluid levels > rx augmentin/ ent eval Please schedule a follow up office visit in 2 weeks, sooner if needed  with all medications /inhalers/ solutions in hand so we can verify exactly what you are taking. This includes all medications from all doctors and over the counters        05/23/2017  f/u ov/Rodney Foster re:  Sinusitis/ Refractory cough/ did not bring meds / has not seen ent yet  Chief Complaint  Patient presents with  .  Follow-up    Cough is some better, but not completely resolved. Cough is prod at times with light green sputum.    Continues with mostly noct/ early am coughing / now on bactrim per PCP  Not limited by breathing from desired activities  Though very inactive  No obvious day to day or daytime variability or assoc   mucus plugs or hemoptysis or cp or chest tightness, subjective wheeze or overt sinus or hb symptoms. No unusual exp hx or h/o childhood pna/ asthma or knowledge of premature birth.  Sleeping ok without nocturnal  or early am exacerbation  of respiratory  c/o's or need for noct saba. Also denies any obvious fluctuation of symptoms with weather or environmental changes or other aggravating or alleviating factors except as outlined above   Current Medications, Allergies, Complete Past Medical History, Past Surgical History, Family History, and Social History were reviewed in [employer] record.  ROS  The following are not active complaints unless bolded sore throat, dysphagia, dental problems, itching, sneezing,  nasal congestion or excess/ purulent secretions, ear ache,   fever, chills, sweats, unintended wt loss, classically pleuritic or exertional cp,  orthopnea pnd or leg swelling, presyncope, palpitations, abdominal pain, anorexia, nausea, vomiting,  diarrhea  or change in bowel or bladder habits, change in stools or urine, dysuria,hematuria,  rash, arthralgias, visual complaints, headache, numbness, weakness or ataxia or problems with walking or coordination,  change in mood/affect or memory.                        Objective:   Physical Exam    amb wm nad    05/23/2017        217   05/03/17 214 lb (97.1 kg)  04/10/17 216 lb (98 kg)  01/10/17 215 lb (97.5 kg)    Vital signs reviewed - Note on arrival 02 sats  96% on RA       HEENT: nl dentition,  and oropharynx. Nl external ear canals without cough reflex - moderate bilateral non-specific  turbinate edema - no obvious purulent discharge   NECK :  without JVD/Nodes/TM/ nl carotid upstrokes bilaterally   LUNGS: no acc muscle use,  Nl contour chest    With mild bilateral  coarse insp / exp rhonchi s cough on insp/exp    CV:  RRR  no s3 or murmur or increase in P2, and no edema   ABD:  Obese/ soft and nontender with nl inspiratory excursion in the supine position. No bruits or organomegaly appreciated, bowel sounds nl  MS:  Nl gait/ ext warm without deformities, calf tenderness, cyanosis or clubbing No obvious joint restrictions   SKIN: warm and dry without lesions    NEURO:  alert, approp, nl sensorium with  no motor or cerebellar deficits apparent.    CXR PA and Lateral:   05/03/2017 :    I personally reviewed images and agree with radiology impression as follows:   Chronic atelectasis or scarring at the left base. No acute findings.        Assessment:

## 2017-05-23 NOTE — Progress Notes (Signed)
Careteam: Patient Care Team: Estill Dooms, MD as PCP - General (Internal Medicine)  Advanced Directive information Does Patient Have a Medical Advance Directive?: Yes, Type of Advance Directive: Living will  No Known Allergies  Chief Complaint  Patient presents with  . Acute Visit    Pt is being seen due to burning with urination x 1 day. Pt also thinks he removed a tick from penis and now has swollen spot. Pt is currently taking AZO tablets and augmentin      HPI: Patient is a 67 y.o. male seen in the office today due to painful urination.  Last night he found a tick on his penis inside urethra; could not remove with wash cloth and removed it with a twisers. Could not verify if it was a tick because it was so small but had a hard time removing it. 2 nights ago he got up to urinate and his flow was not good and described as a spraying, conts to have problems with flow.  Started burning the next morning.  Has been taking AZO but continues to burn.  Went to [place] a week ago on a road trip Getting up 1-2 times a night but this is normal  No pain to sacrum, buttock, scrotum   Has been on Augmentin due to ongoing chest congestion from pulmonary.   Review of Systems:  Review of Systems  Constitutional: Negative for chills and fever.  Respiratory: Positive for cough and sputum production. Negative for shortness of breath.   Gastrointestinal: Negative for abdominal pain, constipation, diarrhea, nausea and vomiting.  Genitourinary: Positive for dysuria. Negative for flank pain, frequency, hematuria and urgency.  Neurological: Negative for weakness.    Past Medical History:  Diagnosis Date  . Backache, unspecified   . Bronchitis   . Cough variant asthma 05/03/2017  . Genital herpes   . Gout   . Hearing loss   . Hemorrhoids   . Hernia   . Herpes simplex   . Hyperglycemia   . Hyperlipidemia   . Hypertension   . Impotence   . Insomnia, unspecified   . Osteoarthrosis,  unspecified whether generalized or localized, unspecified site   . Tinea pedis, right   . Unspecified disorder of male genital organs    Past Surgical History:  Procedure Laterality Date  . fracture left femur  1984  . ganglion repair of left forarm  2000  . IVP  40   ML old comp fracture  . NASAL SEPTUM SURGERY  1997  . TONSILLECTOMY  1959  . UMBILICAL HERNIA REPAIR  2002   Social History:   reports that he quit smoking about 33 years ago. He has a 20.00 pack-year smoking history. He has never used smokeless tobacco. He reports that he drinks alcohol. He reports that he does not use drugs.  Family History  Problem Relation Age of Onset  . COPD Mother   . Aortic aneurysm Father     Medications: Patient's Medications  New Prescriptions   No medications on file  Previous Medications   ACYCLOVIR (ZOVIRAX) 400 MG TABLET    TAKE 1 TO PREVENT HERPES AND TAKE 5 TABLETS PER DAY FOR ACTIVE HERPES   AMLODIPINE (NORVASC) 5 MG TABLET    TAKE 1 TABLET BY MOUTH EVERY DAY TO CONTROL BLOOD PRESSURE   AMOXICILLIN-CLAVULANATE (AUGMENTIN) 875-125 MG TABLET    Take 1 tablet by mouth 2 (two) times daily.   ASPIRIN EC 81 MG TABLET    Take  81 mg by mouth daily.   BUDESONIDE-FORMOTEROL (SYMBICORT) 80-4.5 MCG/ACT INHALER    Inhale 2 puffs into the lungs 2 (two) times daily.   CETIRIZINE (ZYRTEC) 10 MG TABLET    Take 10 mg by mouth daily.   FAMOTIDINE (PEPCID) 20 MG TABLET    One at bedtime   FEBUXOSTAT (ULORIC) 40 MG TABLET    Take 1 tablet (40 mg total) by mouth daily.   FLUTICASONE (FLONASE) 50 MCG/ACT NASAL SPRAY    Place into both nostrils. One spray both nostrils at bedtime   IBUPROFEN (ADVIL,MOTRIN) 400 MG TABLET    Take 1 tablet (400 mg total) by mouth every 6 (six) hours as needed.   LOSARTAN (COZAAR) 100 MG TABLET    TAKE 1 TABLET BY MOUTH EVERY DAY TO CONTROL BLOOD PRESSURE   OVER THE COUNTER MEDICATION    Take 1 tablet by mouth daily. ZEAL   PANTOPRAZOLE (PROTONIX) 40 MG TABLET    Take 1  tablet (40 mg total) by mouth daily. Take 30-60 min before first meal of the day   PSEUDOEPHEDRINE-GUAIFENESIN (MUCINEX D PO)    Take by mouth. Take one tablet in evening   TADALAFIL (CIALIS) 20 MG TABLET    Take 1 tablet (20 mg total) by mouth daily as needed for erectile dysfunction.   ZOLPIDEM (AMBIEN) 10 MG TABLET    Take one tablet by mouth at bedtime as needed for sleep  Modified Medications   No medications on file  Discontinued Medications   No medications on file     Physical Exam:  Vitals:   05/23/17 1006  BP: (!) 142/80  Pulse: 90  Resp: 18  Temp: 97.6 F (36.4 C)  TempSrc: Oral  SpO2: 95%  Weight: 217 lb 12.8 oz (98.8 kg)  Height: 5\' 10"  (1.778 m)   Body mass index is 31.25 kg/m.  Physical Exam  Constitutional: He appears well-developed and well-nourished.  Cardiovascular: Normal rate and regular rhythm.   Pulmonary/Chest: Effort normal and breath sounds normal.  Abdominal: Soft. Bowel sounds are normal. He exhibits no distension. There is no tenderness.  Genitourinary: Rectum normal and prostate normal. No penile tenderness.  Genitourinary Comments: Pinpoint yellow lesion noted to urethral opening    Labs reviewed: Basic Metabolic Panel:  Recent Labs  10/08/16 0814  NA 137  K 4.7  CL 100  CO2 27  GLUCOSE 98  BUN 15  CREATININE 1.06  CALCIUM 9.4   Liver Function Tests:  Recent Labs  10/08/16 0814  AST 27  ALT 37  ALKPHOS 86  BILITOT 0.7  PROT 6.7  ALBUMIN 4.3   No results for input(s): LIPASE, AMYLASE in the last 8760 hours. No results for input(s): AMMONIA in the last 8760 hours. CBC:  Recent Labs  05/03/17 1023  WBC 9.0  NEUTROABS 4.2  HGB 16.6  HCT 47.5  MCV 95.7  PLT 246.0   Lipid Panel:  Recent Labs  10/08/16 0814  CHOL 242*  HDL 48  LDLCALC 160*  TRIG 172*  CHOLHDL 5.0   TSH: No results for input(s): TSH in the last 8760 hours. A1C: Lab Results  Component Value Date   HGBA1C 5.5 09/12/2015      Assessment/Plan 1. Dysuria -has been on azo therefore urine discolored, will send urine off for UA C&S  - sulfamethoxazole-trimethoprim (BACTRIM DS) 800-160 MG tablet; Take 1 tablet by mouth 2 (two) times daily. (Patient not taking: Reported on 05/23/2017)  Dispense: 20 tablet; Refill: 0 - CBC with  Differential/Platelets - Culture, Urine - Urinalysis  2. Tick bite, initial encounter To monitor area of removal. To notify for rash, body aches, fatigue, fever, chills.   Return precautions discussed  Carlos American. Harle Battiest  [facility] & Adult Medicine [phone] 8 am - 5 pm) [phone] (after hours)

## 2017-05-24 LAB — URINALYSIS
Bilirubin Urine: NEGATIVE
Glucose, UA: NEGATIVE
Hgb urine dipstick: NEGATIVE
Ketones, ur: NEGATIVE
NITRITE: POSITIVE — AB
Protein, ur: NEGATIVE
SPECIFIC GRAVITY, URINE: 1.007 (ref 1.001–1.035)
pH: 6.5 (ref 5.0–8.0)

## 2017-05-25 LAB — URINE CULTURE: Organism ID, Bacteria: NO GROWTH

## 2017-05-26 NOTE — Assessment & Plan Note (Signed)
See cxr 05/03/2017 c/w LLL partial atx post basal segment   Needs to return to this clinic to f/u his cxr and lower airway symptoms p ent eval /rx complete>> advised

## 2017-05-26 NOTE — Assessment & Plan Note (Signed)
05/03/2017    try symbicort 80 2bid  - Allergy profile 05/03/2017 >  Eos 0.1 /  IgE  72 RAST neg - Sinus CT 05/13/2017 >>> Status post antrostomies bilaterally. Air-fluid levels in each maxillary antrum consistent with acute sinusitis> rec augmetin x 10 days then ent eval   - 05/23/2017  After extensive coaching HFA effectiveness =    75% > continue symbicort 80/add flutter when available   He has not seen ent yet and clearly still has lots of upper airway issues >> AB so no change in rx pending ent eval   Each maintenance medication was reviewed in detail including most importantly the difference between maintenance and as needed and under what circumstances the prns are to be used.  Please see AVS for specific  Instructions which are unique to this visit and I personally typed out  which were reviewed in detail in writing with the patient and a copy provided.

## 2017-05-29 ENCOUNTER — Ambulatory Visit: Payer: Medicare HMO

## 2017-06-14 ENCOUNTER — Telehealth: Payer: Self-pay

## 2017-06-14 ENCOUNTER — Other Ambulatory Visit: Payer: Self-pay | Admitting: Internal Medicine

## 2017-06-14 NOTE — Telephone Encounter (Signed)
Left message on voicemail for patient to return call when available    Reason for call: Confirm PCP

## 2017-06-14 NOTE — Telephone Encounter (Signed)
I called patient to see if he planned on remaining a patient with our office since Dr. Nyoka Cowden has retired. Left a message asking patient to call the office to confirm.

## 2017-06-18 ENCOUNTER — Other Ambulatory Visit: Payer: Self-pay | Admitting: *Deleted

## 2017-06-18 MED ORDER — LOSARTAN POTASSIUM 100 MG PO TABS: ORAL_TABLET | ORAL | 3 refills | Status: DC

## 2017-06-18 NOTE — Telephone Encounter (Signed)
Patient requested. Faxed to pharmacy. Patient wants to continue with Janett Billow and will call to schedule an appointment.

## 2017-06-20 ENCOUNTER — Encounter: Payer: Self-pay | Admitting: Nurse Practitioner

## 2017-06-20 ENCOUNTER — Ambulatory Visit (INDEPENDENT_AMBULATORY_CARE_PROVIDER_SITE_OTHER): Payer: Medicare HMO | Admitting: Nurse Practitioner

## 2017-06-20 VITALS — BP 160/100 | HR 75 | Temp 97.6°F | Wt 215.0 lb

## 2017-06-20 DIAGNOSIS — M533 Sacrococcygeal disorders, not elsewhere classified: Secondary | ICD-10-CM

## 2017-06-20 MED ORDER — MELOXICAM 15 MG PO TABS: 15.0000 mg | ORAL_TABLET | Freq: Every day | ORAL | 0 refills | Status: DC

## 2017-06-20 NOTE — Telephone Encounter (Signed)
I spoke with patient and he stated that he would like for Janett Billow to be his PCP. Chart has been updated.
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
## 2017-06-20 NOTE — Patient Instructions (Addendum)
To take meloxicam 15 mg daily Do not use NSAIDS (Aleve, Advil, Motrin, Ibuprofen) with meloxicam   To alternate ice and heat May use muscle rub AFTERWARD  May also take tylenol 1000 mg every 8 hours as needed pain.

## 2017-06-24 ENCOUNTER — Other Ambulatory Visit: Payer: Self-pay | Admitting: Nurse Practitioner

## 2017-06-24 ENCOUNTER — Telehealth: Payer: Self-pay | Admitting: *Deleted

## 2017-06-24 DIAGNOSIS — M533 Sacrococcygeal disorders, not elsewhere classified: Secondary | ICD-10-CM

## 2017-06-24 NOTE — Telephone Encounter (Signed)
Patient called and stated that his hip is no better. Stated that it feels like a knife sticking in it. Patient wants to go to a specialist. Stated it feels like something just isn't right.  Please Advise.

## 2017-06-24 NOTE — Telephone Encounter (Signed)
Will refer to ortho

## 2017-06-24 NOTE — Telephone Encounter (Signed)
Patient has an appointment with the Orthopaedic on 07/08/17.

## 2017-07-08 ENCOUNTER — Ambulatory Visit (INDEPENDENT_AMBULATORY_CARE_PROVIDER_SITE_OTHER): Payer: Medicare HMO | Admitting: Orthopaedic Surgery

## 2017-07-08 ENCOUNTER — Ambulatory Visit (INDEPENDENT_AMBULATORY_CARE_PROVIDER_SITE_OTHER): Payer: Medicare HMO

## 2017-07-08 DIAGNOSIS — M5442 Lumbago with sciatica, left side: Secondary | ICD-10-CM

## 2017-07-08 MED ORDER — TIZANIDINE HCL 4 MG PO TABS: 4.0000 mg | ORAL_TABLET | Freq: Three times a day (TID) | ORAL | 0 refills | Status: DC | PRN

## 2017-07-08 MED ORDER — METHYLPREDNISOLONE 4 MG PO TABS: ORAL_TABLET | ORAL | 0 refills | Status: DC

## 2017-07-08 NOTE — Progress Notes (Signed)
Office Visit Note   Patient: Rodney Foster           Date of Birth: 1950-12-13           MRN: [id number] Visit Date: 07/08/2017              Requested by: [name], NP [place], [place] [address] PCP: [name], NP   Assessment & Plan: Visit Diagnoses:  1. Acute left-sided low back pain with left-sided sciatica     Plan: He states that he almost canceled the appointment she is doing so well. I will send in a six-day steroid taper as well as some muscle relaxant to take as needed. I showed him some stretching exercises to try as well. If his radicular symptoms come back or things worsen I would send him to physical therapy next. He understands this fully. All questions were encouraged and answered.  Follow-Up Instructions: Return if symptoms worsen or fail to improve.   Orders:  Orders Placed This Encounter  Procedures  . XR Lumbar Spine 2-3 Views   Meds ordered this encounter  Medications  . methylPREDNISolone (MEDROL) 4 MG tablet    Sig: Medrol dose pack. Take as instructed    Dispense:  21 tablet    Refill:  0  . tiZANidine (ZANAFLEX) 4 MG tablet    Sig: Take 1 tablet (4 mg total) by mouth every 8 (eight) hours as needed for muscle spasms.    Dispense:  30 tablet    Refill:  0      Procedures: No procedures performed   Clinical Data: No additional findings.   Subjective: No chief complaint on file. The patient is very pleasant 67 year old active individual who comes in today for evaluation of low back pain and strain with left lower extremity radicular symptoms that started about 3 weeks ago. However he feels like things are improving that he almost [place] today. He is not a diabetic but does report that he had some numbness: Down his left leg. He has an old trauma to the lower extremities well. But he tries to reactive he denies any change in bowel or bladder function. He denies any weakness in his lower chamois's.  HPI  Review  of Systems He currently denies any headache, chest pain shortness of breath, fever, chills, nausea, vomiting.  Objective: Vital Signs: There were no vitals taken for this visit.  Physical Exam He is alert and oriented 3 in no acute distress Ortho Exam Examination of his lumbar spine shows no significant pain. He has tight hamstrings with limited flexion but good extension. His lower extremity exam bilaterally is normal in terms of motor and sensory exam. Specialty Comments:  No specialty comments available.  Imaging: Xr Lumbar Spine 2-3 Views  Result Date: 07/08/2017 2 views of lumbar spine show an old compression deformity at the upper lumbar spine that is old. Otherwise there is no acute findings but some mild arthritic changes.    PMFS History: Patient Active Problem List   Diagnosis Date Noted  . Cough variant asthma 05/03/2017  . Abnormal CXR 05/03/2017  . Sinus congestion 04/10/2017  . Chronic bronchitis ([place]) 04/10/2017  . PVC (premature ventricular contraction) 10/10/2016  . Obese 03/28/2016  . Pain in joint, ankle and foot 06/01/2015  . Tinea unguium 03/15/2015  . Vitreous floater 03/15/2015  . Herpes simplex   . Hyperlipidemia   . Gout   . Hypertension   . Hyperglycemia   .  Insomnia   . Impotence    Past Medical History:  Diagnosis Date  . Backache, unspecified   . Bronchitis   . Cough variant asthma 05/03/2017  . Genital herpes   . Gout   . Hearing loss   . Hemorrhoids   . Hernia   . Herpes simplex   . Hyperglycemia   . Hyperlipidemia   . Hypertension   . Impotence   . Insomnia, unspecified   . Osteoarthrosis, unspecified whether generalized or localized, unspecified site   . Tinea pedis, right   . Unspecified disorder of male genital organs     Family History  Problem Relation Age of Onset  . COPD Mother   . Aortic aneurysm Father     Past Surgical History:  Procedure Laterality Date  . fracture left femur  1984  . ganglion repair of left  forarm  2000  . IVP  20   ML old comp fracture  . NASAL SEPTUM SURGERY  1997  . TONSILLECTOMY  1959  . UMBILICAL HERNIA REPAIR  2002   Social History   Occupational History  . Not on file.   Social History Main Topics  . Smoking status: Former Smoker    Packs/day: 1.00    Years: 20.00    Quit date: 01/01/1984  . Smokeless tobacco: Never Used  . Alcohol use 0.0 oz/week    6 - 7 Glasses of wine per week  . Drug use: No  . Sexual activity: Yes

## 2017-07-08 NOTE — Progress Notes (Deleted)
Office Visit Note   Patient: Rodney Foster           Date of Birth: 22-Mar-1950           MRN: [id number] Visit Date: 07/08/2017              Requested by: [name], NP [place], [place] [address] PCP: [name], NP   Assessment & Plan: Visit Diagnoses:  1. Acute left-sided low back pain with left-sided sciatica     Plan: ***  Follow-Up Instructions: No Follow-up on file.   Orders:  Orders Placed This Encounter  Procedures  . XR Lumbar Spine 2-3 Views   Meds ordered this encounter  Medications  . methylPREDNISolone (MEDROL) 4 MG tablet    Sig: Medrol dose pack. Take as instructed    Dispense:  21 tablet    Refill:  0  . tiZANidine (ZANAFLEX) 4 MG tablet    Sig: Take 1 tablet (4 mg total) by mouth every 8 (eight) hours as needed for muscle spasms.    Dispense:  30 tablet    Refill:  0      Procedures: No procedures performed   Clinical Data: No additional findings.   Subjective: No chief complaint on file.   HPI  Review of Systems   Objective: Vital Signs: There were no vitals taken for this visit.  Physical Exam  Ortho Exam  Specialty Comments:  No specialty comments available.  Imaging: No results found.   PMFS History: Patient Active Problem List   Diagnosis Date Noted  . Cough variant asthma 05/03/2017  . Abnormal CXR 05/03/2017  . Sinus congestion 04/10/2017  . Chronic bronchitis ([place]) 04/10/2017  . PVC (premature ventricular contraction) 10/10/2016  . Obese 03/28/2016  . Pain in joint, ankle and foot 06/01/2015  . Tinea unguium 03/15/2015  . Vitreous floater 03/15/2015  . Herpes simplex   . Hyperlipidemia   . Gout   . Hypertension   . Hyperglycemia   . Insomnia   . Impotence    Past Medical History:  Diagnosis Date  . Backache, unspecified   . Bronchitis   . Cough variant asthma 05/03/2017  . Genital herpes   . Gout   . Hearing loss   . Hemorrhoids   . Hernia   . Herpes simplex   .  Hyperglycemia   . Hyperlipidemia   . Hypertension   . Impotence   . Insomnia, unspecified   . Osteoarthrosis, unspecified whether generalized or localized, unspecified site   . Tinea pedis, right   . Unspecified disorder of male genital organs     Family History  Problem Relation Age of Onset  . COPD Mother   . Aortic aneurysm Father     Past Surgical History:  Procedure Laterality Date  . fracture left femur  1984  . ganglion repair of left forarm  2000  . IVP  34   ML old comp fracture  . NASAL SEPTUM SURGERY  1997  . TONSILLECTOMY  1959  . UMBILICAL HERNIA REPAIR  2002   Social History   Occupational History  . Not on file.   Social History Main Topics  . Smoking status: Former Smoker    Packs/day: 1.00    Years: 20.00    Quit date: 01/01/1984  . Smokeless tobacco: Never Used  . Alcohol use 0.0 oz/week    6 - 7 Glasses of wine per week  . Drug use: No  .  Sexual activity: Yes

## 2017-07-10 ENCOUNTER — Other Ambulatory Visit: Payer: Self-pay | Admitting: Internal Medicine

## 2017-07-10 ENCOUNTER — Other Ambulatory Visit: Payer: Self-pay | Admitting: *Deleted

## 2017-07-10 MED ORDER — ZOLPIDEM TARTRATE 10 MG PO TABS: ORAL_TABLET | ORAL | 1 refills | Status: DC

## 2017-07-10 NOTE — Telephone Encounter (Signed)
Patient requested Rx to be sent to pharmacy.  

## 2017-07-23 ENCOUNTER — Other Ambulatory Visit: Payer: Self-pay | Admitting: Nurse Practitioner

## 2017-07-23 ENCOUNTER — Other Ambulatory Visit: Payer: Self-pay | Admitting: *Deleted

## 2017-07-23 DIAGNOSIS — M533 Sacrococcygeal disorders, not elsewhere classified: Secondary | ICD-10-CM

## 2017-07-23 MED ORDER — MELOXICAM 15 MG PO TABS: 15.0000 mg | ORAL_TABLET | Freq: Every day | ORAL | 2 refills | Status: DC

## 2017-07-23 NOTE — Telephone Encounter (Signed)
Patient called requesting Refill

## 2017-07-25 ENCOUNTER — Telehealth: Payer: Self-pay | Admitting: Nurse Practitioner

## 2017-07-25 NOTE — Telephone Encounter (Signed)
Left message asking pt to call and reschedule [id number]. VDM (DD)

## 2017-07-31 ENCOUNTER — Ambulatory Visit: Payer: Medicare HMO

## 2017-08-23 ENCOUNTER — Ambulatory Visit: Payer: Medicare HMO

## 2017-08-26 ENCOUNTER — Ambulatory Visit (INDEPENDENT_AMBULATORY_CARE_PROVIDER_SITE_OTHER): Payer: Medicare HMO

## 2017-08-26 VITALS — BP 140/78 | HR 70 | Temp 97.5°F | Ht 70.0 in | Wt 218.0 lb

## 2017-08-26 DIAGNOSIS — Z23 Encounter for immunization: Secondary | ICD-10-CM | POA: Diagnosis not present

## 2017-08-26 DIAGNOSIS — Z0000 Encounter for general adult medical examination without abnormal findings: Secondary | ICD-10-CM | POA: Diagnosis not present

## 2017-08-26 NOTE — Patient Instructions (Addendum)
[name] , Thank you for taking time to come for your Medicare Wellness Visit. I appreciate your ongoing commitment to your health goals. Please review the following plan we discussed and let me know if I can assist you in the future.   Screening recommendations/referrals: Colonoscopy up to date. Due 11/18/22 Recommended yearly ophthalmology/optometry visit for glaucoma screening and checkup Recommended yearly dental visit for hygiene and checkup  Vaccinations: Influenza vaccine due, let us know when you are ready for it. Pneumococcal vaccine 13 given today. PNA 23 due 08/26/2018 Tdap vaccine up to date. Due 12/04/2023 Shingles vaccine due, let us know if you want this sent to your pharamacy  Advanced directives: In Chart  Conditions/risks identified: None  Next appointment:   Preventive Care 69 Years and Older, Male Preventive care refers to lifestyle choices and visits with your health care provider that can promote health and wellness. What does preventive care include?  A yearly physical exam. This is also called an annual well check.  Dental exams once or twice a year.  Routine eye exams. Ask your health care provider how often you should have your eyes checked.  Personal lifestyle choices, including:  Daily care of your teeth and gums.  Regular physical activity.  Eating a healthy diet.  Avoiding tobacco and drug use.  Limiting alcohol use.  Practicing safe sex.  Taking low doses of aspirin every day.  Taking vitamin and mineral supplements as recommended by your health care provider. What happens during an annual well check? The services and screenings done by your health care provider during your annual well check will depend on your age, overall health, lifestyle risk factors, and family history of disease. Counseling  Your health care provider may ask you questions about your:  Alcohol use.  Tobacco use.  Drug use.  Emotional well-being.  Home and  relationship well-being.  Sexual activity.  Eating habits.  History of falls.  Memory and ability to understand (cognition).  Work and work Statistician. Screening  You may have the following tests or measurements:  Height, weight, and BMI.  Blood pressure.  Lipid and cholesterol levels. These may be checked every 5 years, or more frequently if you are over 58 years old.  Skin check.  Lung cancer screening. You may have this screening every year starting at age 10 if you have a 30-pack-year history of smoking and currently smoke or have quit within the past 15 years.  Fecal occult blood test (FOBT) of the stool. You may have this test every year starting at age 65.  Flexible sigmoidoscopy or colonoscopy. You may have a sigmoidoscopy every 5 years or a colonoscopy every 10 years starting at age 45.  Prostate cancer screening. Recommendations will vary depending on your family history and other risks.  Hepatitis C blood test.  Hepatitis B blood test.  Sexually transmitted disease (STD) testing.  Diabetes screening. This is done by checking your blood sugar (glucose) after you have not eaten for a while (fasting). You may have this done every 1-3 years.  Abdominal aortic aneurysm (AAA) screening. You may need this if you are a current or former smoker.  Osteoporosis. You may be screened starting at age 22 if you are at high risk. Talk with your health care provider about your test results, treatment options, and if necessary, the need for more tests. Vaccines  Your health care provider may recommend certain vaccines, such as:  Influenza vaccine. This is recommended every year.  Tetanus, diphtheria,  and acellular pertussis (Tdap, Td) vaccine. You may need a Td booster every 10 years.  Zoster vaccine. You may need this after age 80.  Pneumococcal 13-valent conjugate (PCV13) vaccine. One dose is recommended after age 51.  Pneumococcal polysaccharide (PPSV23) vaccine. One  dose is recommended after age 72. Talk to your health care provider about which screenings and vaccines you need and how often you need them. This information is not intended to replace advice given to you by your health care provider. Make sure you discuss any questions you have with your health care provider. Document Released: 01/13/2016 Document Revised: 09/05/2016 Document Reviewed: 10/18/2015 Elsevier Interactive Patient Education  2017 [place] Prevention in the Home Falls can cause injuries. They can happen to people of all ages. There are many things you can do to make your home safe and to help prevent falls. What can I do on the outside of my home?  Regularly fix the edges of walkways and driveways and fix any cracks.  Remove anything that might make you trip as you walk through a door, such as a raised step or threshold.  Trim any bushes or trees on the path to your home.  Use bright outdoor lighting.  Clear any walking paths of anything that might make someone trip, such as rocks or tools.  Regularly check to see if handrails are loose or broken. Make sure that both sides of any steps have handrails.  Any raised decks and porches should have guardrails on the edges.  Have any leaves, snow, or ice cleared regularly.  Use sand or salt on walking paths during winter.  Clean up any spills in your garage right away. This includes oil or grease spills. What can I do in the bathroom?  Use night lights.  Install grab bars by the toilet and in the tub and shower. Do not use towel bars as grab bars.  Use non-skid mats or decals in the tub or shower.  If you need to sit down in the shower, use a plastic, non-slip stool.  Keep the floor dry. Clean up any water that spills on the floor as soon as it happens.  Remove soap buildup in the tub or shower regularly.  Attach bath mats securely with double-sided non-slip rug tape.  Do not have throw rugs and other  things on the floor that can make you trip. What can I do in the bedroom?  Use night lights.  Make sure that you have a light by your bed that is easy to reach.  Do not use any sheets or blankets that are too big for your bed. They should not hang down onto the floor.  Have a firm chair that has side arms. You can use this for support while you get dressed.  Do not have throw rugs and other things on the floor that can make you trip. What can I do in the kitchen?  Clean up any spills right away.  Avoid walking on wet floors.  Keep items that you use a lot in easy-to-reach places.  If you need to reach something above you, use a strong step stool that has a grab bar.  Keep electrical cords out of the way.  Do not use floor polish or wax that makes floors slippery. If you must use wax, use non-skid floor wax.  Do not have throw rugs and other things on the floor that can make you trip. What can I do with my  stairs?  Do not leave any items on the stairs.  Make sure that there are handrails on both sides of the stairs and use them. Fix handrails that are broken or loose. Make sure that handrails are as long as the stairways.  Check any carpeting to make sure that it is firmly attached to the stairs. Fix any carpet that is loose or worn.  Avoid having throw rugs at the top or bottom of the stairs. If you do have throw rugs, attach them to the floor with carpet tape.  Make sure that you have a light switch at the top of the stairs and the bottom of the stairs. If you do not have them, ask someone to add them for you. What else can I do to help prevent falls?  Wear shoes that:  Do not have high heels.  Have rubber bottoms.  Are comfortable and fit you well.  Are closed at the toe. Do not wear sandals.  If you use a stepladder:  Make sure that it is fully opened. Do not climb a closed stepladder.  Make sure that both sides of the stepladder are locked into place.  Ask  someone to hold it for you, if possible.  Clearly mark and make sure that you can see:  Any grab bars or handrails.  First and last steps.  Where the edge of each step is.  Use tools that help you move around (mobility aids) if they are needed. These include:  Canes.  Walkers.  Scooters.  Crutches.  Turn on the lights when you go into a dark area. Replace any light bulbs as soon as they burn out.  Set up your furniture so you have a clear path. Avoid moving your furniture around.  If any of your floors are uneven, fix them.  If there are any pets around you, be aware of where they are.  Review your medicines with your doctor. Some medicines can make you feel dizzy. This can increase your chance of falling. Ask your doctor what other things that you can do to help prevent falls. This information is not intended to replace advice given to you by your health care provider. Make sure you discuss any questions you have with your health care provider. Document Released: 10/13/2009 Document Revised: 05/24/2016 Document Reviewed: 01/21/2015 Elsevier Interactive Patient Education  2017 [employer].

## 2017-08-26 NOTE — Progress Notes (Signed)
Subjective:   Rodney Foster is a 67 y.o. male who presents for an Initial Medicare Annual Wellness Visit.    Objective:    Today's Vitals   08/26/17 1528  BP: 140/78  Pulse: 70  Temp: (!) 97.5 F (36.4 C)  TempSrc: Oral  SpO2: 96%  Weight: 218 lb (98.9 kg)  Height: 5\' 10"  (1.778 m)   Body mass index is 31.28 kg/m.  Current Medications (verified) Outpatient Encounter Prescriptions as of 08/26/2017  Medication Sig  . acyclovir (ZOVIRAX) 400 MG tablet TAKE 1 TO PREVENT HERPES AND TAKE 5 TABLETS PER DAY FOR ACTIVE HERPES  . amLODipine (NORVASC) 5 MG tablet TAKE 1 TABLET BY MOUTH EVERY DAY TO CONTROL BLOOD PRESSURE  . aspirin EC 81 MG tablet Take 81 mg by mouth daily.  . febuxostat (ULORIC) 40 MG tablet Take 1 tablet (40 mg total) by mouth daily.  . fluticasone (FLONASE) 50 MCG/ACT nasal spray Place into both nostrils. One spray both nostrils at bedtime  . ibuprofen (ADVIL,MOTRIN) 400 MG tablet Take 1 tablet (400 mg total) by mouth every 6 (six) hours as needed.  Marland Kitchen losartan (COZAAR) 100 MG tablet Take one tablet by mouth once daily to control blood pressure  . tadalafil (CIALIS) 20 MG tablet Take 1 tablet (20 mg total) by mouth daily as needed for erectile dysfunction.  Marland Kitchen tiZANidine (ZANAFLEX) 4 MG tablet Take 1 tablet (4 mg total) by mouth every 8 (eight) hours as needed for muscle spasms.  Marland Kitchen zolpidem (AMBIEN) 10 MG tablet Take one tablet by mouth at bedtime as needed for sleep  . Pseudoephedrine-Guaifenesin (MUCINEX D PO) Take by mouth. Take one tablet in evening  . [DISCONTINUED] meloxicam (MOBIC) 15 MG tablet Take 1 tablet (15 mg total) by mouth daily.  . [DISCONTINUED] methylPREDNISolone (MEDROL) 4 MG tablet Medrol dose pack. Take as instructed  . [DISCONTINUED] OVER THE COUNTER MEDICATION Take 1 tablet by mouth daily. ZEAL   No facility-administered encounter medications on file as of 08/26/2017.     Allergies (verified) Patient has no known allergies.   History: Past  Medical History:  Diagnosis Date  . Backache, unspecified   . Bronchitis   . Cough variant asthma 05/03/2017  . Genital herpes   . Gout   . Hearing loss   . Hemorrhoids   . Hernia   . Herpes simplex   . Hyperglycemia   . Hyperlipidemia   . Hypertension   . Impotence   . Insomnia, unspecified   . Osteoarthrosis, unspecified whether generalized or localized, unspecified site   . Tinea pedis, right   . Unspecified disorder of male genital organs    Past Surgical History:  Procedure Laterality Date  . fracture left femur  1984  . ganglion repair of left forarm  2000  . IVP  53   ML old comp fracture  . NASAL SEPTUM SURGERY  1997  . TONSILLECTOMY  1959  . UMBILICAL HERNIA REPAIR  2002   Family History  Problem Relation Age of Onset  . COPD Mother   . Aortic aneurysm Father    Social History   Occupational History  . Not on file.   Social History Main Topics  . Smoking status: Former Smoker    Packs/day: 1.00    Years: 15.00    Quit date: 01/01/1984  . Smokeless tobacco: Never Used  . Alcohol use 0.0 oz/week    6 - 7 Glasses of wine per week  . Drug use: No  .  Sexual activity: Yes   Tobacco Counseling Counseling given: Not Answered   Activities of Daily Living In your present state of health, do you have any difficulty performing the following activities: 08/26/2017  Hearing? N  Vision? N  Difficulty concentrating or making decisions? N  Walking or climbing stairs? N  Dressing or bathing? N  Doing errands, shopping? N  Preparing Food and eating ? N  Using the Toilet? N  In the past six months, have you accidently leaked urine? N  Do you have problems with loss of bowel control? N  Managing your Medications? N  Managing your Finances? N  Housekeeping or managing your Housekeeping? N  Some recent data might be hidden    Immunizations and Health Maintenance Immunization History  Administered Date(s) Administered  . Influenza,inj,Quad PF,6+ Mos  12/02/2013, 03/07/2016, 10/10/2016  . Influenza-Unspecified 12/05/2011  . Td 12/31/1994, 12/03/2013   Health Maintenance Due  Topic Date Due  . Hepatitis C Screening  04-26-1950  . PNA vac Low Risk Adult (1 of 2 - PCV13) 12/07/2013  . INFLUENZA VACCINE  07/31/2017    Patient Care Team: [name], NP as PCP - General (Geriatric Medicine)  Indicate any recent Medical Services you may have received from other than [facility] providers in the past year (date may be approximate).    Assessment:   This is a routine wellness examination for Rodney Foster.   Hearing/Vision screen No exam data present  Dietary issues and exercise activities discussed: Current Exercise Habits: Home exercise routine, Type of exercise: strength training/weights;walking, Time (Minutes): 30, Frequency (Times/Week): 1, Weekly Exercise (Minutes/Week): 30, Intensity: Mild, Exercise limited by: None identified  Goals    . Exercise 3x per week (30 min per time)          Patient will go back to the gym      Depression Screen PHQ 2/9 Scores 08/26/2017 10/10/2016 03/28/2016 06/01/2015  PHQ - 2 Score 0 0 0 0    Fall Risk Fall Risk  08/26/2017 05/23/2017 10/10/2016 03/28/2016 11/23/2015  Falls in the past year? Yes No No No No  Number falls in past yr: 1 - - - -  Injury with Fall? Yes - - - -  Comment L wrist - - - -    Cognitive Function: MMSE - Mini Mental State Exam 08/26/2017 10/10/2016  Orientation to time 5 5  Orientation to Place 5 5  Registration 3 3  Attention/ Calculation 5 5  Recall 2 3  Language- name 2 objects 2 2  Language- repeat 1 1  Language- follow 3 step command 2 3  Language- read & follow direction 1 1  Write a sentence 1 1  Copy design 1 1  Total score 28 30        Screening Tests Health Maintenance  Topic Date Due  . Hepatitis C Screening  Oct 21, 1950  . PNA vac Low Risk Adult (1 of 2 - PCV13) 12/07/2013  . INFLUENZA VACCINE  07/31/2017  . COLONOSCOPY  11/18/2022  . TETANUS/TDAP   12/04/2023        Plan:  I have personally reviewed and addressed the Medicare Annual Wellness questionnaire and have noted the following in the patient's chart:  A. Medical and social history B. Use of alcohol, tobacco or illicit drugs  C. Current medications and supplements D. Functional ability and status E.  Nutritional status F.  Physical activity G. Advance directives H. List of other physicians I.  Hospitalizations, surgeries, and ER visits in  previous 12 months J.  Vitals K. Screenings to include hearing, vision, cognitive, depression L. Referrals and appointments - none  In addition, I have reviewed and discussed with patient certain preventive protocols, quality metrics, and best practice recommendations. A written personalized care plan for preventive services as well as general preventive health recommendations were provided to patient.  See attached scanned questionnaire for additional information.   Signed,   Rich Reining, RN Nurse Health Advisor   Quick Notes   Health Maintenance: PNA 13 given. Flu declined for today. Hep C screen due     Abnormal Screen: MMSE 28/30. Did not pass clock     Patient Concerns: none     Nurse Concerns: none

## 2017-09-03 ENCOUNTER — Other Ambulatory Visit: Payer: Self-pay | Admitting: *Deleted

## 2017-09-03 MED ORDER — ZOLPIDEM TARTRATE 10 MG PO TABS: ORAL_TABLET | ORAL | 0 refills | Status: DC

## 2017-09-03 NOTE — Telephone Encounter (Signed)
Patient Requested.  Called pharmacy and spoke with Pharmacist and called in Rx.

## 2017-09-25 ENCOUNTER — Other Ambulatory Visit: Payer: Self-pay | Admitting: *Deleted

## 2017-09-25 MED ORDER — FEBUXOSTAT 40 MG PO TABS: 40.0000 mg | ORAL_TABLET | Freq: Every day | ORAL | 1 refills | Status: DC

## 2017-09-25 NOTE — Telephone Encounter (Signed)
Patient requested refill to be faxed to [facility] [phone]

## 2017-10-17 ENCOUNTER — Other Ambulatory Visit: Payer: Self-pay

## 2017-10-17 ENCOUNTER — Ambulatory Visit (INDEPENDENT_AMBULATORY_CARE_PROVIDER_SITE_OTHER): Payer: Medicare HMO | Admitting: *Deleted

## 2017-10-17 DIAGNOSIS — Z23 Encounter for immunization: Secondary | ICD-10-CM

## 2017-10-17 MED ORDER — ZOLPIDEM TARTRATE 10 MG PO TABS: ORAL_TABLET | ORAL | 0 refills | Status: DC

## 2017-10-28 ENCOUNTER — Other Ambulatory Visit: Payer: Self-pay | Admitting: Internal Medicine

## 2017-11-11 ENCOUNTER — Encounter: Payer: Self-pay | Admitting: Nurse Practitioner

## 2017-11-11 ENCOUNTER — Ambulatory Visit: Payer: Medicare HMO | Admitting: Nurse Practitioner

## 2017-11-11 VITALS — BP 138/78 | HR 99 | Temp 97.7°F | Resp 19 | Ht 70.0 in | Wt 214.4 lb

## 2017-11-11 DIAGNOSIS — G4700 Insomnia, unspecified: Secondary | ICD-10-CM

## 2017-11-11 DIAGNOSIS — I10 Essential (primary) hypertension: Secondary | ICD-10-CM

## 2017-11-11 DIAGNOSIS — J45991 Cough variant asthma: Secondary | ICD-10-CM | POA: Diagnosis not present

## 2017-11-11 DIAGNOSIS — B009 Herpesviral infection, unspecified: Secondary | ICD-10-CM | POA: Diagnosis not present

## 2017-11-11 DIAGNOSIS — E785 Hyperlipidemia, unspecified: Secondary | ICD-10-CM | POA: Diagnosis not present

## 2017-11-11 DIAGNOSIS — M1A9XX0 Chronic gout, unspecified, without tophus (tophi): Secondary | ICD-10-CM | POA: Diagnosis not present

## 2017-11-11 NOTE — Progress Notes (Signed)
Careteam: Patient Care Team: [name], NP as PCP - General (Geriatric Medicine)  Advanced Directive information Does Patient Have a Medical Advance Directive?: Yes, Type of Advance Directive: Living will, Does patient want to make changes to medical advance directive?: Yes (MAU/Ambulatory/Procedural Areas - Information given)  No Known Allergies  Chief Complaint  Patient presents with  . Medical Management of Chronic Issues    Pt is being seen for a 6 month routine visit.      HPI: Patient is a 67 y.o. male seen in the office today for routine follow up. Previous patient of Dr Nyoka Cowden. Pt with hx of htn, hyperglycemia, hyperlipidemia, obesity, sinusitis, insomnia, gout At last visit with Dr Nyoka Cowden was referred to pulmonary due to persistent bronchitis  He was then referred to ENT (he never went because he states "im good" and instructed to stay on acid suppression and reflux diet. Not currently taking anything for this and states "i am doing ok" No nasal congestion. Occasionally uses mucinex D  No recent gout flares- by from [place].  htn- controlled on norvasc and losartan.  Herpes- uses acyclovir daily for suppression, had a flare a few weeks ago where he increases and takes for 5 days which is effective.  insomnia uses 1/2 tablet which is effective.  Exercising 3-4 times a week. Lifts weight and then does cardio.  Review of Systems:  Review of Systems  Constitutional: Negative for chills, fever and weight loss.  HENT: Negative for tinnitus.   Respiratory: Negative for cough, sputum production and shortness of breath.   Cardiovascular: Negative for chest pain, palpitations and leg swelling.  Gastrointestinal: Negative for abdominal pain, constipation, diarrhea and heartburn.  Genitourinary: Negative for dysuria, frequency and urgency.       Gets up twice at night to urinate   Musculoskeletal: Positive for falls (in the spring after he took his puppy outside at 3 am  when it was raining. ). Negative for back pain, joint pain and myalgias.  Skin: Negative.   Neurological: Negative for dizziness and headaches.  Psychiatric/Behavioral: Negative for depression and memory loss. The patient has insomnia (controlled on [place]).     Past Medical History:  Diagnosis Date  . Backache, unspecified   . Bronchitis   . Cough variant asthma 05/03/2017  . Genital herpes   . Gout   . Hearing loss   . Hemorrhoids   . Hernia   . Herpes simplex   . Hyperglycemia   . Hyperlipidemia   . Hypertension   . Impotence   . Insomnia, unspecified   . Osteoarthrosis, unspecified whether generalized or localized, unspecified site   . Tinea pedis, right   . Unspecified disorder of male genital organs    Past Surgical History:  Procedure Laterality Date  . fracture left femur  1984  . ganglion repair of left forarm  2000  . IVP  85   ML old comp fracture  . NASAL SEPTUM SURGERY  1997  . TONSILLECTOMY  1959  . UMBILICAL HERNIA REPAIR  2002   Social History:   reports that he quit smoking about 33 years ago. He has a 15.00 pack-year smoking history. he has never used smokeless tobacco. He reports that he drinks alcohol. He reports that he does not use drugs.  Family History  Problem Relation Age of Onset  . COPD Mother   . Aortic aneurysm Father     Medications:   Medication List  Accurate as of 11/11/17  9:51 AM. Always use your most recent med list.          acyclovir 400 MG tablet Commonly known as:  ZOVIRAX TAKE 1 TO PREVENT HERPES AND TAKE 5 TABLETS PER DAY FOR ACTIVE HERPES   amLODipine 5 MG tablet Commonly known as:  NORVASC TAKE 1 TABLET BY MOUTH EVERY DAY TO CONTROL BLOOD PRESSURE   aspirin EC 81 MG tablet   febuxostat 40 MG tablet Commonly known as:  ULORIC Take 1 tablet (40 mg total) by mouth daily.   FLONASE 50 MCG/ACT nasal spray Generic drug:  fluticasone   ibuprofen 400 MG tablet Commonly known as:  ADVIL,MOTRIN Take 1  tablet (400 mg total) by mouth every 6 (six) hours as needed.   losartan 100 MG tablet Commonly known as:  COZAAR Take one tablet by mouth once daily to control blood pressure   MUCINEX D PO   tadalafil 20 MG tablet Commonly known as:  CIALIS Take 1 tablet (20 mg total) by mouth daily as needed for erectile dysfunction.   zolpidem 10 MG tablet Commonly known as:  AMBIEN Take one tablet by mouth at bedtime as needed for sleep        Physical Exam:  Vitals:   11/11/17 0946  BP: 138/78  Pulse: 99  Resp: 19  Temp: 97.7 F (36.5 C)  TempSrc: Oral  SpO2: 97%  Weight: 214 lb 6.4 oz (97.3 kg)  Height: '5\' 10"'  (1.778 m)   Body mass index is 30.76 kg/m.  Physical Exam  Constitutional: He is oriented to person, place, and time. He appears well-developed and well-nourished. No distress.  HENT:  Head: Normocephalic and atraumatic.  Mouth/Throat: Oropharynx is clear and moist. No oropharyngeal exudate.  Eyes: Conjunctivae and EOM are normal. Pupils are equal, round, and reactive to light.  Neck: Normal range of motion. Neck supple.  Cardiovascular: Normal rate, regular rhythm and normal heart sounds.  Pulmonary/Chest: Effort normal and breath sounds normal.  Abdominal: Soft. Bowel sounds are normal.  Musculoskeletal: He exhibits no edema or tenderness.  Neurological: He is alert and oriented to person, place, and time.  Skin: Skin is warm and dry. He is not diaphoretic.  Psychiatric: He has a normal mood and affect.    Labs reviewed: Basic Metabolic Panel: No results for input(s): NA, K, CL, CO2, GLUCOSE, BUN, CREATININE, CALCIUM, MG, PHOS, TSH in the last 8760 hours. Liver Function Tests: No results for input(s): AST, ALT, ALKPHOS, BILITOT, PROT, ALBUMIN in the last 8760 hours. No results for input(s): LIPASE, AMYLASE in the last 8760 hours. No results for input(s): AMMONIA in the last 8760 hours. CBC: Recent Labs    05/03/17 1023 05/23/17 1059  WBC 9.0 8.3    NEUTROABS 4.2 3,403  HGB 16.6 16.0  HCT 47.5 44.9  MCV 95.7 93.9  PLT 246.0 288   Lipid Panel: No results for input(s): CHOL, HDL, LDLCALC, TRIG, CHOLHDL, LDLDIRECT in the last 8760 hours. TSH: No results for input(s): TSH in the last 8760 hours. A1C: Lab Results  Component Value Date   HGBA1C 5.5 09/12/2015     Assessment/Plan 1. Hypertension, essential -controlled on current regimen, encouraged not to use pseudoephedrine which was on his medication list due to nasal congestion.  -DASH Diet given.  -cont current regimen    2. Chronic gout without tophus, unspecified cause, unspecified site -conts on uloric, no recent flare - Uric acid; Future  3. Cough variant asthma Improved, not currently on  medication related to this.   4. Hyperlipidemia, unspecified hyperlipidemia type -not on medications for this. Will follow up lab work. Cont diet and exercise.  - CMP with eGFR; Future - Lipid Panel; Future  5. Insomnia, unspecified type -controlled with zolpidem, uses 1/2 tablet qhs.   6. Herpes simplex -on valtrex for suppressive therapy, occasional flare.   Next appt: 6 months, sooner if needed  Anderson Middlebrooks [name]  [facility] [phone] 8 am - 5 pm) [phone] (after hours)

## 2017-11-11 NOTE — Patient Instructions (Signed)
Make appt for fasting blood work tomorrow morning.     DASH Eating Plan DASH stands for "Dietary Approaches to Stop Hypertension." The DASH eating plan is a healthy eating plan that has been shown to reduce high blood pressure (hypertension). It may also reduce your risk for type 2 diabetes, heart disease, and stroke. The DASH eating plan may also help with weight loss. What are tips for following this plan? General guidelines  Avoid eating more than 2,300 mg (milligrams) of salt (sodium) a day. If you have hypertension, you may need to reduce your sodium intake to 1,500 mg a day.  Limit alcohol intake to no more than 1 drink a day for nonpregnant women and 2 drinks a day for men. One drink equals 12 oz of beer, 5 oz of wine, or 1 oz of hard liquor.  Work with your health care provider to maintain a healthy body weight or to lose weight. Ask what an ideal weight is for you.  Get at least 30 minutes of exercise that causes your heart to beat faster (aerobic exercise) most days of the week. Activities may include walking, swimming, or biking.  Work with your health care provider or diet and nutrition specialist (dietitian) to adjust your eating plan to your individual calorie needs. Reading food labels  Check food labels for the amount of sodium per serving. Choose foods with less than 5 percent of the Daily Value of sodium. Generally, foods with less than 300 mg of sodium per serving fit into this eating plan.  To find whole grains, look for the word "whole" as the first word in the ingredient list. Shopping  Buy products labeled as "low-sodium" or "no salt added."  Buy fresh foods. Avoid canned foods and premade or frozen meals. Cooking  Avoid adding salt when cooking. Use salt-free seasonings or herbs instead of table salt or sea salt. Check with your health care provider or pharmacist before using salt substitutes.  Do not fry foods. Cook foods using healthy methods such as baking,  boiling, grilling, and broiling instead.  Cook with heart-healthy oils, such as olive, canola, soybean, or sunflower oil. Meal planning   Eat a balanced diet that includes: ? 5 or more servings of fruits and vegetables each day. At each meal, try to fill half of your plate with fruits and vegetables. ? Up to 6-8 servings of whole grains each day. ? Less than 6 oz of lean meat, poultry, or fish each day. A 3-oz serving of meat is about the same size as a deck of cards. One egg equals 1 oz. ? 2 servings of low-fat dairy each day. ? A serving of nuts, seeds, or beans 5 times each week. ? Heart-healthy fats. Healthy fats called Omega-3 fatty acids are found in foods such as flaxseeds and coldwater fish, like sardines, salmon, and mackerel.  Limit how much you eat of the following: ? Canned or prepackaged foods. ? Food that is high in trans fat, such as fried foods. ? Food that is high in saturated fat, such as fatty meat. ? Sweets, desserts, sugary drinks, and other foods with added sugar. ? Full-fat dairy products.  Do not salt foods before eating.  Try to eat at least 2 vegetarian meals each week.  Eat more home-cooked food and less restaurant, buffet, and fast food.  When eating at a restaurant, ask that your food be prepared with less salt or no salt, if possible. What foods are recommended? The items listed  may not be a complete list. Talk with your dietitian about what dietary choices are best for you. Grains Whole-grain or whole-wheat bread. Whole-grain or whole-wheat pasta. Brown rice. [place]. Bulgur. Whole-grain and low-sodium cereals. Pita bread. Low-fat, low-sodium crackers. Whole-wheat flour tortillas. Vegetables Fresh or frozen vegetables (raw, steamed, roasted, or grilled). Low-sodium or reduced-sodium tomato and vegetable juice. Low-sodium or reduced-sodium tomato sauce and tomato paste. Low-sodium or reduced-sodium canned vegetables. Fruits All fresh, dried, or  frozen fruit. Canned fruit in natural juice (without added sugar). Meat and other protein foods Skinless chicken or [place]. Ground chicken or [place]. Pork with fat trimmed off. Fish and seafood. Egg whites. Dried beans, peas, or lentils. Unsalted nuts, nut butters, and seeds. Unsalted canned beans. Lean cuts of beef with fat trimmed off. Low-sodium, lean deli meat. Dairy Low-fat (1%) or fat-free (skim) milk. Fat-free, low-fat, or reduced-fat cheeses. Nonfat, low-sodium ricotta or cottage cheese. Low-fat or nonfat yogurt. Low-fat, low-sodium cheese. Fats and oils Soft margarine without trans fats. Vegetable oil. Low-fat, reduced-fat, or light mayonnaise and salad dressings (reduced-sodium). Canola, safflower, olive, soybean, and sunflower oils. Avocado. Seasoning and other foods Herbs. Spices. Seasoning mixes without salt. Unsalted popcorn and pretzels. Fat-free sweets. What foods are not recommended? The items listed may not be a complete list. Talk with your dietitian about what dietary choices are best for you. Grains Baked goods made with fat, such as croissants, muffins, or some breads. Dry pasta or rice meal packs. Vegetables Creamed or fried vegetables. Vegetables in a cheese sauce. Regular canned vegetables (not low-sodium or reduced-sodium). Regular canned tomato sauce and paste (not low-sodium or reduced-sodium). Regular tomato and vegetable juice (not low-sodium or reduced-sodium). Angie Fava. Olives. Fruits Canned fruit in a light or heavy syrup. Fried fruit. Fruit in cream or butter sauce. Meat and other protein foods Fatty cuts of meat. Ribs. Fried meat. Berniece Salines. Sausage. Bologna and other processed lunch meats. Salami. Fatback. Hotdogs. Bratwurst. Salted nuts and seeds. Canned beans with added salt. Canned or smoked fish. Whole eggs or egg yolks. Chicken or [place] with skin. Dairy Whole or 2% milk, cream, and half-and-half. Whole or full-fat cream cheese. Whole-fat or sweetened yogurt.  Full-fat cheese. Nondairy creamers. Whipped toppings. Processed cheese and cheese spreads. Fats and oils Butter. Stick margarine. Lard. Shortening. Ghee. Bacon fat. Tropical oils, such as coconut, palm kernel, or palm oil. Seasoning and other foods Salted popcorn and pretzels. Onion salt, garlic salt, seasoned salt, table salt, and sea salt. Worcestershire sauce. Tartar sauce. Barbecue sauce. Teriyaki sauce. Soy sauce, including reduced-sodium. Steak sauce. Canned and packaged gravies. Fish sauce. Oyster sauce. Cocktail sauce. Horseradish that you find on the shelf. Ketchup. Mustard. Meat flavorings and tenderizers. Bouillon cubes. Hot sauce and Tabasco sauce. Premade or packaged marinades. Premade or packaged taco seasonings. Relishes. Regular salad dressings. Where to find more information:  National Heart, Lung, and [place]: https://wilson-eaton.com/  American Heart Association: www.heart.org Summary  The DASH eating plan is a healthy eating plan that has been shown to reduce high blood pressure (hypertension). It may also reduce your risk for type 2 diabetes, heart disease, and stroke.  With the DASH eating plan, you should limit salt (sodium) intake to 2,300 mg a day. If you have hypertension, you may need to reduce your sodium intake to 1,500 mg a day.  When on the DASH eating plan, aim to eat more fresh fruits and vegetables, whole grains, lean proteins, low-fat dairy, and heart-healthy fats.  Work with your health care provider or diet  and nutrition specialist (dietitian) to adjust your eating plan to your individual calorie needs. This information is not intended to replace advice given to you by your health care provider. Make sure you discuss any questions you have with your health care provider. Document Released: 12/06/2011 Document Revised: 12/10/2016 Document Reviewed: 12/10/2016 Elsevier Interactive Patient Education  2017 [employer].

## 2017-11-12 ENCOUNTER — Other Ambulatory Visit: Payer: Self-pay

## 2017-11-12 ENCOUNTER — Other Ambulatory Visit: Payer: Medicare HMO

## 2017-11-12 DIAGNOSIS — M1A9XX0 Chronic gout, unspecified, without tophus (tophi): Secondary | ICD-10-CM

## 2017-11-12 DIAGNOSIS — E785 Hyperlipidemia, unspecified: Secondary | ICD-10-CM

## 2017-11-12 LAB — LIPID PANEL
Cholesterol: 255 mg/dL — ABNORMAL HIGH (ref <200)
HDL: 56 mg/dL (ref >40)
LDL Cholesterol (Calc): 162 mg/dL (calc) — ABNORMAL HIGH
NON-HDL CHOLESTEROL (CALC): 199 mg/dL — AB (ref <130)
TRIGLYCERIDES: 210 mg/dL — AB (ref <150)
Total CHOL/HDL Ratio: 4.6 (calc) (ref <5.0)

## 2017-11-12 LAB — COMPLETE METABOLIC PANEL WITH GFR
AG RATIO: 1.8 (calc) (ref 1.0–2.5)
ALKALINE PHOSPHATASE (APISO): 94 U/L (ref 40–115)
ALT: 37 U/L (ref 9–46)
AST: 26 U/L (ref 10–35)
Albumin: 4.4 g/dL (ref 3.6–5.1)
BILIRUBIN TOTAL: 0.4 mg/dL (ref 0.2–1.2)
BUN: 13 mg/dL (ref 7–25)
CALCIUM: 9.3 mg/dL (ref 8.6–10.3)
CHLORIDE: 101 mmol/L (ref 98–110)
CO2: 26 mmol/L (ref 20–32)
Creat: 0.87 mg/dL (ref 0.70–1.25)
GFR, EST NON AFRICAN AMERICAN: 89 mL/min/{1.73_m2} (ref >=60)
GFR, Est African American: 103 mL/min/{1.73_m2} (ref >=60)
Globulin: 2.5 g/dL (calc) (ref 1.9–3.7)
Glucose, Bld: 104 mg/dL — ABNORMAL HIGH (ref 65–99)
POTASSIUM: 4.6 mmol/L (ref 3.5–5.3)
SODIUM: 136 mmol/L (ref 135–146)
Total Protein: 6.9 g/dL (ref 6.1–8.1)

## 2017-11-12 LAB — URIC ACID: URIC ACID, SERUM: 6 mg/dL (ref 4.0–8.0)

## 2017-12-06 ENCOUNTER — Other Ambulatory Visit: Payer: Self-pay | Admitting: *Deleted

## 2017-12-06 MED ORDER — ZOLPIDEM TARTRATE 10 MG PO TABS: ORAL_TABLET | ORAL | 0 refills | Status: DC

## 2017-12-06 NOTE — Telephone Encounter (Signed)
Patient requested. Phoned to pharmacy.  

## 2017-12-18 ENCOUNTER — Other Ambulatory Visit: Payer: Self-pay | Admitting: Nurse Practitioner

## 2018-01-09 ENCOUNTER — Telehealth: Payer: Self-pay | Admitting: *Deleted

## 2018-01-09 NOTE — Telephone Encounter (Signed)
Would recommend using neti pot twice daily for sinus congestion  Plain nasal saline spray throughout the day as needed  To Avoid forcefully blowing nose as this can cause nasal congestion to be worse.  May use tylenol 325 mg 2 tablets every 6 hours as needed aches and pains or sore throat humidifier in the home to help with the dry air Have him use Mucinex DM by mouth twice daily for 7 days consistently with full glass of water- then can use as needed for cough and congestion  Keep well hydrated

## 2018-01-09 NOTE — Telephone Encounter (Signed)
   Patient c/o cold, upper respiratory symptoms or sinus infection  1.) How long have you had your symptoms? 3 days  2.) Any fever, if yes did you check with a thermometer? No fever  3.) Any myalgias (muscle aches)? Not alot  4.) Are you coughing up mucus, if yes is it discolored? Some with Greenish/yellowish tint  5.) What have you tried for symptoms? Has tried Zicam, Aspirin, Mucinex with no relief.   6.) Have you been around anyone sick? Yes has been at a friends house that has been sick with URI.   Pharmacy Confirmed.   I will forward your message to your provider and call with response. If your symptoms progress please seek medical attention at Urgent Care

## 2018-01-09 NOTE — Telephone Encounter (Signed)
Patient notified and agreed.  

## 2018-01-13 ENCOUNTER — Encounter: Payer: Self-pay | Admitting: Nurse Practitioner

## 2018-01-13 ENCOUNTER — Ambulatory Visit (INDEPENDENT_AMBULATORY_CARE_PROVIDER_SITE_OTHER): Payer: Medicare HMO | Admitting: Nurse Practitioner

## 2018-01-13 VITALS — BP 132/78 | HR 78 | Temp 98.6°F | Resp 18 | Wt 225.0 lb

## 2018-01-13 DIAGNOSIS — J0140 Acute pansinusitis, unspecified: Secondary | ICD-10-CM

## 2018-01-13 MED ORDER — AMOXICILLIN 500 MG PO CAPS: 1000.0000 mg | ORAL_CAPSULE | Freq: Three times a day (TID) | ORAL | 0 refills | Status: DC

## 2018-01-13 NOTE — Progress Notes (Signed)
Careteam: Patient Care Team: Rodney Chandler, NP as PCP - General (Geriatric Medicine)  Advanced Directive information    No Known Allergies  Chief Complaint  Patient presents with  . Acute Visit    Pt is being seen due to head/chest congestion and cough for 1 week. Pt has tried OTC mucinex dm, zicam,     HPI: Patient is a 68 y.o. male seen in the office today due to head congestion and chest congestion.  Has not been able to sleep due to the cough.  Now mucus is green and has increased.  Has been taking mucinex DM by mouth twice daily  Has increased fluids No fever. Been going on for 7 days and feels like it is getting worse. No better.  Had a tsp of cough syrup with codeine that helped him sleep which he has not been able to in several days. Not using flonase, but using saline  Review of Systems:  Review of Systems  Constitutional: Positive for malaise/fatigue. Negative for chills and fever.  HENT: Positive for congestion and sinus pain. Negative for ear pain, hearing loss, nosebleeds, sore throat and tinnitus.        Scratchy throat  Respiratory: Positive for cough and sputum production. Negative for shortness of breath.        Chest congestion, mucinex helping this  Cardiovascular: Negative for chest pain and palpitations.  Skin: Negative for rash.  Neurological: Negative for weakness.    Past Medical History:  Diagnosis Date  . Backache, unspecified   . Bronchitis   . Cough variant asthma 05/03/2017  . Genital herpes   . Gout   . Hearing loss   . Hemorrhoids   . Hernia   . Herpes simplex   . Hyperglycemia   . Hyperlipidemia   . Hypertension   . Impotence   . Insomnia, unspecified   . Osteoarthrosis, unspecified whether generalized or localized, unspecified site   . Tinea pedis, right   . Unspecified disorder of male genital organs    Past Surgical History:  Procedure Laterality Date  . fracture left femur  1984  . ganglion repair of left forarm   2000  . IVP  15   ML old comp fracture  . NASAL SEPTUM SURGERY  1997  . TONSILLECTOMY  1959  . UMBILICAL HERNIA REPAIR  2002   Social History:   reports that he quit smoking about 34 years ago. He has a 15.00 pack-year smoking history. he has never used smokeless tobacco. He reports that he drinks alcohol. He reports that he does not use drugs.  Family History  Problem Relation Age of Onset  . COPD Mother   . Aortic aneurysm Father     Medications: Patient's Medications  New Prescriptions   No medications on file  Previous Medications   ACYCLOVIR (ZOVIRAX) 400 MG TABLET    TAKE 1 TO PREVENT HERPES AND TAKE 5 TABLETS PER DAY FOR ACTIVE HERPES   AMLODIPINE (NORVASC) 5 MG TABLET    TAKE 1 TABLET BY MOUTH EVERY DAY TO CONTROL BLOOD PRESSURE   ASPIRIN EC 81 MG TABLET    Take 81 mg by mouth daily.   FEBUXOSTAT (ULORIC) 40 MG TABLET    Take 1 tablet (40 mg total) by mouth daily.   FLUTICASONE (FLONASE) 50 MCG/ACT NASAL SPRAY    Place into both nostrils. One spray both nostrils at bedtime   IBUPROFEN (ADVIL,MOTRIN) 400 MG TABLET    Take 1 tablet (400  mg total) by mouth every 6 (six) hours as needed.   LOSARTAN (COZAAR) 100 MG TABLET    Take one tablet by mouth once daily to control blood pressure   TADALAFIL (CIALIS) 20 MG TABLET    Take 1 tablet (20 mg total) by mouth daily as needed for erectile dysfunction.   ZOLPIDEM (AMBIEN) 10 MG TABLET    Take one tablet by mouth at bedtime as needed for sleep  Modified Medications   No medications on file  Discontinued Medications   No medications on file     Physical Exam:  Vitals:   01/13/18 1137  BP: 132/78  Pulse: 78  Resp: 18  Temp: 98.6 F (37 C)  TempSrc: Oral  SpO2: 96%  Weight: 225 lb (102.1 kg)   Body mass index is 32.28 kg/m.  Physical Exam  Constitutional: He is oriented to person, place, and time. He appears well-developed and well-nourished.  HENT:  Head: Normocephalic and atraumatic.  Right Ear: External ear  normal.  Left Ear: External ear normal.  Nose: Mucosal edema and rhinorrhea present.  Mouth/Throat: Posterior oropharyngeal erythema (no edema or exudate ) present.  Eyes: EOM are normal. Pupils are equal, round, and reactive to light.  Neck: Normal range of motion. Neck supple.  Cardiovascular: Normal rate, regular rhythm and normal heart sounds.  Pulmonary/Chest: Effort normal and breath sounds normal.  Neurological: He is alert and oriented to person, place, and time.  Skin: Skin is warm and dry.    Labs reviewed: Basic Metabolic Panel: Recent Labs    11/12/17 0833  NA 136  K 4.6  CL 101  CO2 26  GLUCOSE 104*  BUN 13  CREATININE 0.87  CALCIUM 9.3   Liver Function Tests: Recent Labs    11/12/17 0833  AST 26  ALT 37  BILITOT 0.4  PROT 6.9   No results for input(s): LIPASE, AMYLASE in the last 8760 hours. No results for input(s): AMMONIA in the last 8760 hours. CBC: Recent Labs    05/03/17 1023 05/23/17 1059  WBC 9.0 8.3  NEUTROABS 4.2 3,403  HGB 16.6 16.0  HCT 47.5 44.9  MCV 95.7 93.9  PLT 246.0 288   Lipid Panel: Recent Labs    11/12/17 0833  CHOL 255*  HDL 56  TRIG 210*  CHOLHDL 4.6   TSH: No results for input(s): TSH in the last 8760 hours. A1C: Lab Results  Component Value Date   HGBA1C 5.5 09/12/2015     Assessment/Plan 1. Acute non-recurrent pansinusitis -cont supportive care -currently using mucinex-D, advised this can cause blood pressure to go up -recommended to use delysm 5 cc BID for cough or mucinex DM twice daily - amoxicillin (AMOXIL) 500 MG capsule; Take 2 capsules (1,000 mg total) by mouth every 8 (eight) hours.  Dispense: 42 capsule; Refill: 0 -to use humidifier  -tylenol PRN -flonase twice daily with nasal saline  Next appt: as scheduled, return precautions discussed Rodney Foster K. Rodney Foster  [facility] [phone] 8 am - 5 pm) [phone] (after hours)

## 2018-01-13 NOTE — Patient Instructions (Addendum)
To use delsym 5 ml by mouth every 12 hours for cough Cont to use MUCINEX Use caution with MUCINEX-D which can elevate blood pressure  start flonase twice daily along with plain saline.  To use amoxicillin 500 mg 2 tablets every 8 hours for 7 days for sinusitis  May use tylenol 325 mg 2 tablets every 6 hours as needed aches and pains or sore throat humidifier in the home to help with the dry air Avoid forcefully blowing nose    Sinusitis, Adult Sinusitis is soreness and inflammation of your sinuses. Sinuses are hollow spaces in the bones around your face. Your sinuses are located:  Around your eyes.  In the middle of your forehead.  Behind your nose.  In your cheekbones.  Your sinuses and nasal passages are lined with a stringy fluid (mucus). Mucus normally drains out of your sinuses. When your nasal tissues become inflamed or swollen, the mucus can become trapped or blocked so air cannot flow through your sinuses. This allows bacteria, viruses, and funguses to grow, which leads to infection. Sinusitis can develop quickly and last for 7?10 days (acute) or for more than 12 weeks (chronic). Sinusitis often develops after a cold. What are the causes? This condition is caused by anything that creates swelling in the sinuses or stops mucus from draining, including:  Allergies.  Asthma.  Bacterial or viral infection.  Abnormally shaped bones between the nasal passages.  Nasal growths that contain mucus (nasal polyps).  Narrow sinus openings.  Pollutants, such as chemicals or irritants in the air.  A foreign object stuck in the nose.  A fungal infection. This is rare.  What increases the risk? The following factors may make you more likely to develop this condition:  Having allergies or asthma.  Having had a recent cold or respiratory tract infection.  Having structural deformities or blockages in your nose or sinuses.  Having a weak immune system.  Doing a lot of  swimming or diving.  Overusing nasal sprays.  Smoking.  What are the signs or symptoms? The main symptoms of this condition are pain and a feeling of pressure around the affected sinuses. Other symptoms include:  Upper toothache.  Earache.  Headache.  Bad breath.  Decreased sense of smell and taste.  A cough that may get worse at night.  Fatigue.  Fever.  Thick drainage from your nose. The drainage is often green and it may contain pus (purulent).  Stuffy nose or congestion.  Postnasal drip. This is when extra mucus collects in the throat or back of the nose.  Swelling and warmth over the affected sinuses.  Sore throat.  Sensitivity to light.  How is this diagnosed? This condition is diagnosed based on symptoms, a medical history, and a physical exam. To find out if your condition is acute or chronic, your health care provider may:  Look in your nose for signs of nasal polyps.  Tap over the affected sinus to check for signs of infection.  View the inside of your sinuses using an imaging device that has a light attached (endoscope).  If your health care provider suspects that you have chronic sinusitis, you may also:  Be tested for allergies.  Have a sample of mucus taken from your nose (nasal culture) and checked for bacteria.  Have a mucus sample examined to see if your sinusitis is related to an allergy.  If your sinusitis does not respond to treatment and it lasts longer than 8 weeks, you may  have an MRI or CT scan to check your sinuses. These scans also help to determine how severe your infection is. In rare cases, a bone biopsy may be done to rule out more serious types of fungal sinus disease. How is this treated? Treatment for sinusitis depends on the cause and whether your condition is chronic or acute. If a virus is causing your sinusitis, your symptoms will go away on their own within 10 days. You may be given medicines to relieve your symptoms,  including:  Topical nasal decongestants. They shrink swollen nasal passages and let mucus drain from your sinuses.  Antihistamines. These drugs block inflammation that is triggered by allergies. This can help to ease swelling in your nose and sinuses.  Topical nasal corticosteroids. These are nasal sprays that ease inflammation and swelling in your nose and sinuses.  Nasal saline washes. These rinses can help to get rid of thick mucus in your nose.  If your condition is caused by bacteria, you will be given an antibiotic medicine. If your condition is caused by a fungus, you will be given an antifungal medicine. Surgery may be needed to correct underlying conditions, such as narrow nasal passages. Surgery may also be needed to remove polyps. Follow these instructions at home: Medicines  Take, use, or apply over-the-counter and prescription medicines only as told by your health care provider. These may include nasal sprays.  If you were prescribed an antibiotic medicine, take it as told by your health care provider. Do not stop taking the antibiotic even if you start to feel better. Hydrate and Humidify  Drink enough water to keep your urine clear or pale yellow. Staying hydrated will help to thin your mucus.  Use a cool mist humidifier to keep the humidity level in your home above 50%.  Inhale steam for 10-15 minutes, 3-4 times a day or as told by your health care provider. You can do this in the bathroom while a hot shower is running.  Limit your exposure to cool or dry air. Rest  Rest as much as possible.  Sleep with your head raised (elevated).  Make sure to get enough sleep each night. General instructions  Apply a warm, moist washcloth to your face 3-4 times a day or as told by your health care provider. This will help with discomfort.  Wash your hands often with soap and water to reduce your exposure to viruses and other germs. If soap and water are not available, use hand  sanitizer.  Do not smoke. Avoid being around people who are smoking (secondhand smoke).  Keep all follow-up visits as told by your health care provider. This is important. Contact a health care provider if:  You have a fever.  Your symptoms get worse.  Your symptoms do not improve within 10 days. Get help right away if:  You have a severe headache.  You have persistent vomiting.  You have pain or swelling around your face or eyes.  You have vision problems.  You develop confusion.  Your neck is stiff.  You have trouble breathing. This information is not intended to replace advice given to you by your health care provider. Make sure you discuss any questions you have with your health care provider. Document Released: 12/17/2005 Document Revised: 08/12/2016 Document Reviewed: 10/12/2015 Elsevier Interactive Patient Education  [employer].

## 2018-01-22 ENCOUNTER — Other Ambulatory Visit: Payer: Self-pay | Admitting: *Deleted

## 2018-01-22 MED ORDER — ZOLPIDEM TARTRATE 10 MG PO TABS: ORAL_TABLET | ORAL | 0 refills | Status: DC

## 2018-01-22 NOTE — Telephone Encounter (Signed)
Patient called and requested. Called into pharmacy.

## 2018-02-07 ENCOUNTER — Telehealth: Payer: Self-pay | Admitting: *Deleted

## 2018-02-07 NOTE — Telephone Encounter (Signed)
Received prior authorization from [facility] for Zolpidem. Initiated through [facility]. 24-72 hour determination.  [id number] PA Case [id number] Member [id number]

## 2018-02-10 NOTE — Telephone Encounter (Signed)
Received fax from [facility] stating Zolpidem is APPROVED through 02/08/2020 Member ID: [id number]

## 2018-03-10 ENCOUNTER — Other Ambulatory Visit: Payer: Self-pay | Admitting: *Deleted

## 2018-03-10 ENCOUNTER — Telehealth: Payer: Self-pay | Admitting: *Deleted

## 2018-03-10 MED ORDER — ZOSTER VAC RECOMB ADJUVANTED 50 MCG/0.5ML IM SUSR: 0.5000 mL | Freq: Once | INTRAMUSCULAR | 1 refills | Status: AC

## 2018-03-10 MED ORDER — ZOLPIDEM TARTRATE 10 MG PO TABS: ORAL_TABLET | ORAL | 0 refills | Status: DC

## 2018-03-10 NOTE — Telephone Encounter (Signed)
Rx sent 

## 2018-03-10 NOTE — Telephone Encounter (Signed)
Patient called and stated that he is interested in receiving the shingles vaccine. Would like a Rx faxed to his pharmacy. Pended Rx and sent to [facility] for approval.  Please Advise.

## 2018-03-19 ENCOUNTER — Other Ambulatory Visit: Payer: Self-pay | Admitting: Nurse Practitioner

## 2018-03-27 ENCOUNTER — Other Ambulatory Visit: Payer: Self-pay | Admitting: *Deleted

## 2018-03-27 DIAGNOSIS — N529 Male erectile dysfunction, unspecified: Secondary | ICD-10-CM

## 2018-03-27 MED ORDER — TADALAFIL 20 MG PO TABS: 20.0000 mg | ORAL_TABLET | Freq: Every day | ORAL | 5 refills | Status: DC | PRN

## 2018-03-27 NOTE — Telephone Encounter (Signed)
Patient requested refill to be sent to [facility]. Faxed.

## 2018-04-15 ENCOUNTER — Other Ambulatory Visit: Payer: Self-pay | Admitting: *Deleted

## 2018-04-15 MED ORDER — ZOLPIDEM TARTRATE 10 MG PO TABS: ORAL_TABLET | ORAL | 0 refills | Status: DC

## 2018-05-01 ENCOUNTER — Other Ambulatory Visit: Payer: Self-pay | Admitting: *Deleted

## 2018-05-01 MED ORDER — FEBUXOSTAT 40 MG PO TABS: 40.0000 mg | ORAL_TABLET | Freq: Every day | ORAL | 1 refills | Status: DC

## 2018-05-01 NOTE — Telephone Encounter (Signed)
Patient called and requested refill to be faxed to [facility] Fax: [phone] Printed and placed in [place] folder to sign. Fax cover attached.

## 2018-05-12 ENCOUNTER — Ambulatory Visit: Payer: Medicare HMO | Admitting: Nurse Practitioner

## 2018-05-12 ENCOUNTER — Encounter: Payer: Medicare HMO | Admitting: Nurse Practitioner

## 2018-05-19 ENCOUNTER — Encounter: Payer: Self-pay | Admitting: Nurse Practitioner

## 2018-05-20 ENCOUNTER — Encounter: Payer: Medicare HMO | Admitting: Nurse Practitioner

## 2018-05-29 ENCOUNTER — Other Ambulatory Visit: Payer: Self-pay | Admitting: *Deleted

## 2018-05-29 MED ORDER — ZOLPIDEM TARTRATE 10 MG PO TABS: ORAL_TABLET | ORAL | 0 refills | Status: DC

## 2018-05-29 NOTE — Telephone Encounter (Signed)
Patient called requesting refill on his Zolpidem. Phoned to pharmacy [place] Verified LR: 04/15/2018

## 2018-06-02 ENCOUNTER — Ambulatory Visit (INDEPENDENT_AMBULATORY_CARE_PROVIDER_SITE_OTHER): Payer: Medicare HMO | Admitting: Nurse Practitioner

## 2018-06-02 ENCOUNTER — Encounter: Payer: Self-pay | Admitting: Nurse Practitioner

## 2018-06-02 VITALS — BP 128/74 | HR 97 | Temp 98.7°F | Ht 70.0 in | Wt 213.0 lb

## 2018-06-02 DIAGNOSIS — E782 Mixed hyperlipidemia: Secondary | ICD-10-CM

## 2018-06-02 DIAGNOSIS — I10 Essential (primary) hypertension: Secondary | ICD-10-CM

## 2018-06-02 DIAGNOSIS — Z0000 Encounter for general adult medical examination without abnormal findings: Secondary | ICD-10-CM | POA: Diagnosis not present

## 2018-06-02 DIAGNOSIS — R079 Chest pain, unspecified: Secondary | ICD-10-CM

## 2018-06-02 DIAGNOSIS — R3912 Poor urinary stream: Secondary | ICD-10-CM

## 2018-06-02 NOTE — Progress Notes (Signed)
Provider: [name], NP  Patient Care Team: [name], NP as PCP - General (Geriatric Medicine)  Extended Emergency Contact Information Primary Emergency Contact: Daoughty,Sandy  Johnnette Litter of [place] Phone: [phone] Mobile Phone: [phone] Relation: None No Known Allergies Code Status: full Goals of Care: Advanced Directive information Advanced Directives 06/02/2018  Does Patient Have a Medical Advance Directive? Yes  Type of Advance Directive Living will  Does patient want to make changes to medical advance directive? -  Copy of [place] in Chart? -  Would patient like information on creating a medical advance directive? -     Chief Complaint  Patient presents with  . Medical Management of Chronic Issues    Pt is being seen for a physical. Pt wants to discuss prostate concerns.     HPI: Patient is a 68 y.o. male seen in today for an annual wellness exam.    Major illnesses or hospitalization in the last year- none  Would like prostate checked due to having more problems with urination. Over time decreased flow at times, most of the time it is fine. No acute changes. Tries to drink enough water. Most of the time there is no issues. No increase in frequency. Gets up 1 time at night to urinate. No pain with urination. No blood in urine. No urgency.   Depression screen [facility] 2/9 06/02/2018 08/26/2017 10/10/2016 03/28/2016 06/01/2015  Decreased Interest 0 0 0 0 0  Down, Depressed, Hopeless 0 0 0 0 0  PHQ - 2 Score 0 0 0 0 0    Fall Risk  06/02/2018 01/13/2018 11/11/2017 08/26/2017 05/23/2017  Falls in the past year? No No Yes Yes No  Number falls in past yr: - - 1 1 -  Comment - - fell off porch in the rain - -  Injury with Fall? - - Yes Yes -  Comment - - sprained left wrist L wrist -   MMSE - Mini Mental State Exam 08/26/2017 10/10/2016  Orientation to time 5 5  Orientation to Place 5 5  Registration 3 3  Attention/  Calculation 5 5  Recall 2 3  Language- name 2 objects 2 2  Language- repeat 1 1  Language- follow 3 step command 2 3  Language- read & follow direction 1 1  Write a sentence 1 1  Copy design 1 1  Total score 28 30     Health Maintenance  Topic Date Due  . Hepatitis C Screening  11/11/2018 (Originally 1950/10/03)  . INFLUENZA VACCINE  07/31/2018  . PNA vac Low Risk Adult (2 of 2 - PPSV23) 08/26/2018  . COLONOSCOPY  11/18/2022  . TETANUS/TDAP  12/04/2023     Diet? Did keto diet and lost weight but got off of it and now back on it.   Exercise? Generally twice weekly for 30 mins and then lifts some  Dentition:routinely every 6 months  Ophthalmologist- overdue, recommend at least yearly  Pain:none  Past Medical History:  Diagnosis Date  . Backache, unspecified   . Bronchitis   . Cough variant asthma 05/03/2017  . Genital herpes   . Gout   . Hearing loss   . Hemorrhoids   . Hernia   . Herpes simplex   . Hyperglycemia   . Hyperlipidemia   . Hypertension   . Impotence   . Insomnia, unspecified   . Osteoarthrosis, unspecified whether generalized or localized, unspecified site   . Tinea pedis, right   .  Unspecified disorder of male genital organs     Past Surgical History:  Procedure Laterality Date  . fracture left femur  1984  . ganglion repair of left forarm  2000  . IVP  71   ML old comp fracture  . NASAL SEPTUM SURGERY  1997  . TONSILLECTOMY  1959  . UMBILICAL HERNIA REPAIR  2002    Social History   Socioeconomic History  . Marital status: Divorced    Spouse name: Not on file  . Number of children: Not on file  . Years of education: Not on file  . Highest education level: Not on file  Occupational History  . Not on file  Social Needs  . Financial resource strain: Not on file  . Food insecurity:    Worry: Not on file    Inability: Not on file  . Transportation needs:    Medical: Not on file    Non-medical: Not on file  Tobacco Use  . Smoking  status: Former Smoker    Packs/day: 1.00    Years: 15.00    Pack years: 15.00    Last attempt to quit: 01/01/1984    Years since quitting: 34.4  . Smokeless tobacco: Never Used  Substance and Sexual Activity  . Alcohol use: Yes    Alcohol/week: 0.0 oz    Types: 6 - 7 Glasses of wine per week  . Drug use: No  . Sexual activity: Yes  Lifestyle  . Physical activity:    Days per week: Not on file    Minutes per session: Not on file  . Stress: Not on file  Relationships  . Social connections:    Talks on phone: Not on file    Gets together: Not on file    Attends religious service: Not on file    Active member of club or organization: Not on file    Attends meetings of clubs or organizations: Not on file    Relationship status: Not on file  Other Topics Concern  . Not on file  Social History Narrative  . Not on file    Family History  Problem Relation Age of Onset  . COPD Mother   . Aortic aneurysm Father     Review of Systems:  Review of Systems  Constitutional: Negative for activity change, appetite change, fatigue and unexpected weight change.  HENT: Negative for congestion and hearing loss.   Eyes: Negative.   Respiratory: Negative for cough and shortness of breath.        Chronic bronchitis- no new symptoms   Cardiovascular: Positive for chest pain (occasional). Negative for palpitations and leg swelling.       Occasional chest pain- right upper chest small pain that last seconds and then goes away. Not associated with exercise or activity. Has not experienced during or after exercise. Happens a few times a week for months.   Gastrointestinal: Negative for abdominal pain, constipation and diarrhea.  Genitourinary: Negative for difficulty urinating and dysuria.  Musculoskeletal: Negative for arthralgias and myalgias.  Skin: Negative for color change and wound.  Allergic/Immunologic: Positive for environmental allergies.  Neurological: Negative for dizziness and  weakness.  Psychiatric/Behavioral: Negative for agitation, behavioral problems and confusion.     Allergies as of 06/02/2018   No Known Allergies     Medication List        Accurate as of 06/02/18 11:12 AM. Always use your most recent med list.  acyclovir 400 MG tablet Commonly known as:  ZOVIRAX TAKE 1 TO PREVENT HERPES AND TAKE 5 TABLETS PER DAY FOR ACTIVE HERPES   amLODipine 5 MG tablet Commonly known as:  NORVASC TAKE 1 TABLET BY MOUTH EVERY DAY TO CONTROL BLOOD PRESSURE   aspirin EC 81 MG tablet Take 81 mg by mouth daily.   febuxostat 40 MG tablet Commonly known as:  ULORIC Take 1 tablet (40 mg total) by mouth daily.   FLONASE 50 MCG/ACT nasal spray Generic drug:  fluticasone Place into both nostrils. One spray both nostrils at bedtime   ibuprofen 400 MG tablet Commonly known as:  ADVIL,MOTRIN Take 1 tablet (400 mg total) by mouth every 6 (six) hours as needed.   losartan 100 MG tablet Commonly known as:  COZAAR Take one tablet by mouth once daily to control blood pressure   tadalafil 20 MG tablet Commonly known as:  ADCIRCA/CIALIS Take 1 tablet (20 mg total) by mouth daily as needed for erectile dysfunction.   zolpidem 10 MG tablet Commonly known as:  AMBIEN Take one tablet by mouth at bedtime as needed for sleep         Physical Exam: Vitals:   06/02/18 1100  BP: 128/74  Pulse: 97  Temp: 98.7 F (37.1 C)  TempSrc: Oral  SpO2: 95%  Weight: 213 lb (96.6 kg)  Height: 5\' 10"  (1.778 m)   Body mass index is 30.56 kg/m. Physical Exam  Constitutional: He is oriented to person, place, and time. He appears well-developed and well-nourished. No distress.  HENT:  Head: Normocephalic and atraumatic.  Mouth/Throat: Oropharynx is clear and moist. No oropharyngeal exudate.  Eyes: Pupils are equal, round, and reactive to light. Conjunctivae and EOM are normal.  Neck: Normal range of motion. Neck supple.  Cardiovascular: Normal rate, regular  rhythm and normal heart sounds.  Pulmonary/Chest: Effort normal and breath sounds normal.  Abdominal: Soft. Bowel sounds are normal.  Genitourinary:  Genitourinary Comments: Declines DRE- would like to check prostate with lab ONLY  Musculoskeletal: He exhibits no edema or tenderness.  Neurological: He is alert and oriented to person, place, and time.  Skin: Skin is warm and dry. He is not diaphoretic.  Psychiatric: He has a normal mood and affect.    Labs reviewed: Basic Metabolic Panel: Recent Labs    11/12/17 0833  NA 136  K 4.6  CL 101  CO2 26  GLUCOSE 104*  BUN 13  CREATININE 0.87  CALCIUM 9.3   Liver Function Tests: Recent Labs    11/12/17 0833  AST 26  ALT 37  BILITOT 0.4  PROT 6.9   No results for input(s): LIPASE, AMYLASE in the last 8760 hours. No results for input(s): AMMONIA in the last 8760 hours. CBC: No results for input(s): WBC, NEUTROABS, HGB, HCT, MCV, PLT in the last 8760 hours. Lipid Panel: Recent Labs    11/12/17 0833  CHOL 255*  HDL 56  LDLCALC 162*  TRIG 210*  CHOLHDL 4.6   Lab Results  Component Value Date   HGBA1C 5.5 09/12/2015    Procedures: No results found.  Assessment/Plan 1. Hypertension, essential -stable on current regimen - EKG 12-Lead- normal EKG sinus rate  - CBC with Differential/Platelets; Future  2. Weak urine stream -occasional problem, wishes for lab work only for evaluation.  - PSA; Future  3. Mixed hyperlipidemia -discussed that keto diet was not cholesterol friendly and could make hyperlipidemia worse. Will follow up labs at this time.  - Lipid Panel;  Future - COMPLETE METABOLIC PANEL WITH GFR; Future  4. Wellness examination -overall doing well,  The patient was counseled regarding the appropriate use of alcohol,  prevention of dental and periodontal disease, diet, regular sustained exercise for at least 30 minutes 5 times per week, testicular self-examination on a monthly basis,smoking cessation,  tobacco use,  and recommended schedule for GI hemoccult testing, colonoscopy, cholesterol, thyroid and diabetes screening. -saw dermatology ~ 3 years ago but was charged 700$ therefore did not go back.    5. Chest pain Does not appear cardiac, EKG in normal range. To notify or seek medical attention if symptoms worsen   Next appt: 06/04/2018 [facility]. [place] [phone]

## 2018-06-02 NOTE — Patient Instructions (Addendum)
To make lab appt for next week. Make sure you are fasting  6 month for routine follow up   Health Maintenance, Male A healthy lifestyle and preventive care is important for your health and wellness. Ask your health care provider about what schedule of regular examinations is right for you. What should I know about weight and diet? Eat a Healthy Diet  Eat plenty of vegetables, fruits, whole grains, low-fat dairy products, and lean protein.  Do not eat a lot of foods high in solid fats, added sugars, or salt.  Maintain a Healthy Weight Regular exercise can help you achieve or maintain a healthy weight. You should:  Do at least 150 minutes of exercise each week. The exercise should increase your heart rate and make you sweat (moderate-intensity exercise).  Do strength-training exercises at least twice a week.  Watch Your Levels of Cholesterol and Blood Lipids  Have your blood tested for lipids and cholesterol every 5 years starting at 70 years of age. If you are at high risk for heart disease, you should start having your blood tested when you are 70 years old. You may need to have your cholesterol levels checked more often if: ? Your lipid or cholesterol levels are high. ? You are older than 70 years of age. ? You are at high risk for heart disease.  What should I know about cancer screening? Many types of cancers can be detected early and may often be prevented. Lung Cancer  You should be screened every year for lung cancer if: ? You are a current smoker who has smoked for at least 30 years. ? You are a former smoker who has quit within the past 15 years.  Talk to your health care provider about your screening options, when you should start screening, and how often you should be screened.  Colorectal Cancer  Routine colorectal cancer screening usually begins at 70 years of age and should be repeated every 5-10 years until you are 70 years old. You may need to be screened more  often if early forms of precancerous polyps or small growths are found. Your health care provider may recommend screening at an earlier age if you have risk factors for colon cancer.  Your health care provider may recommend using home test kits to check for hidden blood in the stool.  A small camera at the end of a tube can be used to examine your colon (sigmoidoscopy or colonoscopy). This checks for the earliest forms of colorectal cancer.  Prostate and Testicular Cancer  Depending on your age and overall health, your health care provider may do certain tests to screen for prostate and testicular cancer.  Talk to your health care provider about any symptoms or concerns you have about testicular or prostate cancer.  Skin Cancer  Check your skin from head to toe regularly.  Tell your health care provider about any new moles or changes in moles, especially if: ? There is a change in a mole's size, shape, or color. ? You have a mole that is larger than a pencil eraser.  Always use sunscreen. Apply sunscreen liberally and repeat throughout the day.  Protect yourself by wearing long sleeves, pants, a wide-brimmed hat, and sunglasses when outside.  What should I know about heart disease, diabetes, and high blood pressure?  If you are 28-74 years of age, have your blood pressure checked every 3-5 years. If you are 55 years of age or older, have your blood pressure checked  every year. You should have your blood pressure measured twice-once when you are at a hospital or clinic, and once when you are not at a hospital or clinic. Record the average of the two measurements. To check your blood pressure when you are not at a hospital or clinic, you can use: ? An automated blood pressure machine at a pharmacy. ? A home blood pressure monitor.  Talk to your health care provider about your target blood pressure.  If you are between 66-78 years old, ask your health care provider if you should take  aspirin to prevent heart disease.  Have regular diabetes screenings by checking your fasting blood sugar level. ? If you are at a normal weight and have a low risk for diabetes, have this test once every three years after the age of 69. ? If you are overweight and have a high risk for diabetes, consider being tested at a younger age or more often.  A one-time screening for abdominal aortic aneurysm (AAA) by ultrasound is recommended for men aged 64-75 years who are current or former smokers. What should I know about preventing infection? Hepatitis B If you have a higher risk for hepatitis B, you should be screened for this virus. Talk with your health care provider to find out if you are at risk for hepatitis B infection. Hepatitis C Blood testing is recommended for:  Everyone born from [birth year] through 1967.  Anyone with known risk factors for hepatitis C.  Sexually Transmitted Diseases (STDs)  You should be screened each year for STDs including gonorrhea and chlamydia if: ? You are sexually active and are younger than 70 years of age. ? You are older than 70 years of age and your health care provider tells you that you are at risk for this type of infection. ? Your sexual activity has changed since you were last screened and you are at an increased risk for chlamydia or gonorrhea. Ask your health care provider if you are at risk.  Talk with your health care provider about whether you are at high risk of being infected with HIV. Your health care provider may recommend a prescription medicine to help prevent HIV infection.  What else can I do?  Schedule regular health, dental, and eye exams.  Stay current with your vaccines (immunizations).  Do not use any tobacco products, such as cigarettes, chewing tobacco, and e-cigarettes. If you need help quitting, ask your health care provider.  Limit alcohol intake to no more than 2 drinks per day. One drink equals 12 ounces of beer, 5 ounces of  wine, or 1 ounces of hard liquor.  Do not use street drugs.  Do not share needles.  Ask your health care provider for help if you need support or information about quitting drugs.  Tell your health care provider if you often feel depressed.  Tell your health care provider if you have ever been abused or do not feel safe at home. This information is not intended to replace advice given to you by your health care provider. Make sure you discuss any questions you have with your health care provider. Document Released: 06/14/2008 Document Revised: 08/15/2016 Document Reviewed: 09/20/2015 Elsevier Interactive Patient Education  [employer].

## 2018-06-04 ENCOUNTER — Other Ambulatory Visit: Payer: Medicare HMO

## 2018-06-04 DIAGNOSIS — I10 Essential (primary) hypertension: Secondary | ICD-10-CM

## 2018-06-04 DIAGNOSIS — E782 Mixed hyperlipidemia: Secondary | ICD-10-CM

## 2018-06-04 DIAGNOSIS — R3912 Poor urinary stream: Secondary | ICD-10-CM

## 2018-06-05 ENCOUNTER — Other Ambulatory Visit: Payer: Self-pay | Admitting: Nurse Practitioner

## 2018-06-05 ENCOUNTER — Telehealth: Payer: Self-pay

## 2018-06-05 DIAGNOSIS — E782 Mixed hyperlipidemia: Secondary | ICD-10-CM

## 2018-06-05 LAB — LIPID PANEL
CHOL/HDL RATIO: 5.2 (calc) — AB (ref <5.0)
Cholesterol: 268 mg/dL — ABNORMAL HIGH (ref <200)
HDL: 52 mg/dL (ref >40)
LDL CHOLESTEROL (CALC): 178 mg/dL — AB
Non-HDL Cholesterol (Calc): 216 mg/dL (calc) — ABNORMAL HIGH (ref <130)
TRIGLYCERIDES: 213 mg/dL — AB (ref <150)

## 2018-06-05 LAB — CBC WITH DIFFERENTIAL/PLATELET
BASOS ABS: 69 {cells}/uL (ref 0–200)
BASOS PCT: 1.1 %
Eosinophils Absolute: 107 cells/uL (ref 15–500)
Eosinophils Relative: 1.7 %
HCT: 46.2 % (ref 38.5–50.0)
Hemoglobin: 16.1 g/dL (ref 13.2–17.1)
Lymphs Abs: 2734 cells/uL (ref 850–3900)
MCH: 32.6 pg (ref 27.0–33.0)
MCHC: 34.8 g/dL (ref 32.0–36.0)
MCV: 93.5 fL (ref 80.0–100.0)
MONOS PCT: 10.9 %
MPV: 10.2 fL (ref 7.5–12.5)
Neutro Abs: 2703 cells/uL (ref 1500–7800)
Neutrophils Relative %: 42.9 %
PLATELETS: 299 10*3/uL (ref 140–400)
RBC: 4.94 10*6/uL (ref 4.20–5.80)
RDW: 12.7 % (ref 11.0–15.0)
TOTAL LYMPHOCYTE: 43.4 %
WBC mixed population: 687 cells/uL (ref 200–950)
WBC: 6.3 10*3/uL (ref 3.8–10.8)

## 2018-06-05 LAB — COMPLETE METABOLIC PANEL WITH GFR
AG RATIO: 2.1 (calc) (ref 1.0–2.5)
ALT: 40 U/L (ref 9–46)
AST: 31 U/L (ref 10–35)
Albumin: 4.6 g/dL (ref 3.6–5.1)
Alkaline phosphatase (APISO): 90 U/L (ref 40–115)
BILIRUBIN TOTAL: 0.3 mg/dL (ref 0.2–1.2)
BUN: 16 mg/dL (ref 7–25)
CHLORIDE: 104 mmol/L (ref 98–110)
CO2: 25 mmol/L (ref 20–32)
Calcium: 9.3 mg/dL (ref 8.6–10.3)
Creat: 0.78 mg/dL (ref 0.70–1.25)
GFR, Est African American: 107 mL/min/{1.73_m2} (ref >=60)
GFR, Est Non African American: 92 mL/min/{1.73_m2} (ref >=60)
GLUCOSE: 86 mg/dL (ref 65–99)
Globulin: 2.2 g/dL (calc) (ref 1.9–3.7)
POTASSIUM: 4.7 mmol/L (ref 3.5–5.3)
Sodium: 140 mmol/L (ref 135–146)
TOTAL PROTEIN: 6.8 g/dL (ref 6.1–8.1)

## 2018-06-05 LAB — PSA: PSA: 2.2 ng/mL (ref <=4.0)

## 2018-06-05 MED ORDER — ATORVASTATIN CALCIUM 20 MG PO TABS: 20.0000 mg | ORAL_TABLET | Freq: Every day | ORAL | 5 refills | Status: DC

## 2018-06-05 NOTE — Telephone Encounter (Signed)
-----   Message from [name], NP sent at 06/05/2018  9:15 AM EDT ----- Cholesterol is worse, would recommend starting lipitor 20 mg by mouth daily and having repeat labs done in 3 months to follow up. CMP and Lipid  PSA (prostate) level is in normal range, other labs stable

## 2018-06-11 ENCOUNTER — Ambulatory Visit (INDEPENDENT_AMBULATORY_CARE_PROVIDER_SITE_OTHER): Payer: Medicare HMO | Admitting: Nurse Practitioner

## 2018-06-11 ENCOUNTER — Encounter: Payer: Self-pay | Admitting: Nurse Practitioner

## 2018-06-11 VITALS — BP 118/74 | HR 84 | Temp 98.0°F | Resp 12 | Ht 70.0 in | Wt 210.8 lb

## 2018-06-11 DIAGNOSIS — R5381 Other malaise: Secondary | ICD-10-CM | POA: Diagnosis not present

## 2018-06-11 DIAGNOSIS — J069 Acute upper respiratory infection, unspecified: Secondary | ICD-10-CM | POA: Diagnosis not present

## 2018-06-11 DIAGNOSIS — R5383 Other fatigue: Secondary | ICD-10-CM | POA: Diagnosis not present

## 2018-06-11 LAB — POCT INFLUENZA A/B
INFLUENZA A, POC: NEGATIVE
Influenza B, POC: NEGATIVE

## 2018-06-11 MED ORDER — AZITHROMYCIN 250 MG PO TABS: ORAL_TABLET | ORAL | 0 refills | Status: DC

## 2018-06-11 NOTE — Patient Instructions (Signed)
To use tylenol (acetaminophen) 650 mg by mouth every 6 hours as needed for body ache/fever/sore throat  To start azithromycin- sent to pharmacy  To get chest xray once you leave here  To continue  [place] by mouth twice daily with full glass of water Increase water intake.   Notify/seek medical attention  if symptoms worsen or fail to improve

## 2018-06-11 NOTE — Progress Notes (Signed)
Careteam: Patient Care Team: Rodney Chandler, NP as PCP - General ([facility])  Advanced Directive information    No Known Allergies  Chief Complaint  Patient presents with  . Acute Visit    Head congestion, sweats, body aches, and overall not feeling well, onset Monday night     HPI: Patient is a 68 y.o. male seen in the office today due to head congestion, sweats and body aches that started 2 days ago.  Reports he went to the gym 2 days ago, felt fine then that evening started feeling really bad. Felt like he had a fever, body aches and sweats. "feel like I have the flu" reports headache. Reports increase fatigue.  Decreased appetite.  No cough but decrease mucous in his nose.  Nasal congestion     Review of Systems:  Review of Systems  Constitutional: Positive for chills, diaphoresis, fever and malaise/fatigue.  HENT: Positive for congestion and sore throat. Negative for ear discharge, ear pain and sinus pain.   Respiratory: Negative for cough, sputum production and shortness of breath.        Reports sinus congestion into chest  Cardiovascular: Negative for chest pain and palpitations.  Neurological: Positive for headaches.    Past Medical History:  Diagnosis Date  . Backache, unspecified   . Bronchitis   . Cough variant asthma 05/03/2017  . Genital herpes   . Gout   . Hearing loss   . Hemorrhoids   . Hernia   . Herpes simplex   . Hyperglycemia   . Hyperlipidemia   . Hypertension   . Impotence   . Insomnia, unspecified   . Osteoarthrosis, unspecified whether generalized or localized, unspecified site   . Tinea pedis, right   . Unspecified disorder of male genital organs    Past Surgical History:  Procedure Laterality Date  . fracture left femur  1984  . ganglion repair of left forarm  2000  . IVP  52   ML old comp fracture  . NASAL SEPTUM SURGERY  1997  . TONSILLECTOMY  1959  . UMBILICAL HERNIA REPAIR  2002   Social History:  reports that he quit smoking about 34 years ago. He has a 15.00 pack-year smoking history. He has never used smokeless tobacco. He reports that he drinks alcohol. He reports that he does not use drugs.  Family History  Problem Relation Age of Onset  . COPD Mother   . Aortic aneurysm Father     Medications: Patient's Medications  New Prescriptions   No medications on file  Previous Medications   ACYCLOVIR (ZOVIRAX) 400 MG TABLET    TAKE 1 TO PREVENT HERPES AND TAKE 5 TABLETS PER DAY FOR ACTIVE HERPES   AMLODIPINE (NORVASC) 5 MG TABLET    TAKE 1 TABLET BY MOUTH EVERY DAY TO CONTROL BLOOD PRESSURE   ASPIRIN EC 81 MG TABLET    Take 81 mg by mouth daily.   ATORVASTATIN (LIPITOR) 20 MG TABLET    Take 1 tablet (20 mg total) by mouth daily.   FEBUXOSTAT (ULORIC) 40 MG TABLET    Take 1 tablet (40 mg total) by mouth daily.   FLUTICASONE (FLONASE) 50 MCG/ACT NASAL SPRAY    Place into both nostrils. One spray both nostrils at bedtime   IBUPROFEN (ADVIL,MOTRIN) 400 MG TABLET    Take 1 tablet (400 mg total) by mouth every 6 (six) hours as needed.   LOSARTAN (COZAAR) 100 MG TABLET    TAKE ONE TABLET  BY MOUTH ONCE DAILY TO CONTROL BLOOD PRESSURE   TADALAFIL (CIALIS) 20 MG TABLET    Take 1 tablet (20 mg total) by mouth daily as needed for erectile dysfunction.   ZOLPIDEM (AMBIEN) 10 MG TABLET    Take one tablet by mouth at bedtime as needed for sleep  Modified Medications   No medications on file  Discontinued Medications   No medications on file     Physical Exam:  Vitals:   06/11/18 1053  BP: 118/74  Pulse: 84  Resp: 12  Temp: 98 F (36.7 C)  TempSrc: Oral  Weight: 210 lb 12.8 oz (95.6 kg)  Height: 5\' 10"  (1.778 m)   Body mass index is 30.25 kg/m.  Physical Exam  Constitutional: He is oriented to person, place, and time. He appears well-developed and well-nourished.  HENT:  Head: Normocephalic and atraumatic.  Right Ear: External ear normal.  Left Ear: External ear normal.  Nose:  Nose normal.  Mouth/Throat: Oropharynx is clear and moist. No oropharyngeal exudate.  Eyes: Pupils are equal, round, and reactive to light. Conjunctivae and EOM are normal.  Cardiovascular: Normal rate, regular rhythm and normal heart sounds.  Pulmonary/Chest: Effort normal. He has rhonchi in the left upper field and the left lower field.  Neurological: He is alert and oriented to person, place, and time.  Skin: Skin is warm and dry.  Psychiatric: He has a normal mood and affect.    Labs reviewed: Basic Metabolic Panel: Recent Labs    11/12/17 0833 06/04/18 0852  NA 136 140  K 4.6 4.7  CL 101 104  CO2 26 25  GLUCOSE 104* 86  BUN 13 16  CREATININE 0.87 0.78  CALCIUM 9.3 9.3   Liver Function Tests: Recent Labs    11/12/17 0833 06/04/18 0852  AST 26 31  ALT 37 40  BILITOT 0.4 0.3  PROT 6.9 6.8   No results for input(s): LIPASE, AMYLASE in the last 8760 hours. No results for input(s): AMMONIA in the last 8760 hours. CBC: Recent Labs    06/04/18 0852  WBC 6.3  NEUTROABS 2,703  HGB 16.1  HCT 46.2  MCV 93.5  PLT 299   Lipid Panel: Recent Labs    11/12/17 0833 06/04/18 0852  CHOL 255* 268*  HDL 56 52  LDLCALC 162* 178*  TRIG 210* 213*  CHOLHDL 4.6 5.2*   TSH: No results for input(s): TSH in the last 8760 hours. A1C: Lab Results  Component Value Date   HGBA1C 5.5 09/12/2015     Assessment/Plan 1. Malaise and fatigue - azithromycin (ZITHROMAX) 250 MG tablet; 2 tablets today then 1 tablet daily until complete  Dispense: 6 tablet; Refill: 0 - DG Chest 2 View- to rule out pneumonia however pt request to hold off on xray due to coverage. Will start azithromycin which will cover CAP and sinusitis at this time.   - POC Influenza A/B- negative.   2. Upper respiratory tract infection, unspecified type -continue mucinex by mouth twice daily with full glass of water -acetaminophen 650 mg by mouth every 6 hours as needed for bodyaches, fever, sore throat.  -  azithromycin (ZITHROMAX) 250 MG tablet; 2 tablets today then 1 tablet daily until complete  Dispense: 6 tablet; Refill: 0 - DG Chest 2 View - POC Influenza A/B Follow up precautions given Rodney Foster, Rodney Foster [phone]

## 2018-06-13 ENCOUNTER — Other Ambulatory Visit: Payer: Self-pay | Admitting: Nurse Practitioner

## 2018-07-01 ENCOUNTER — Encounter: Payer: Self-pay | Admitting: Nurse Practitioner

## 2018-07-01 ENCOUNTER — Other Ambulatory Visit: Payer: Self-pay | Admitting: *Deleted

## 2018-07-01 ENCOUNTER — Ambulatory Visit: Payer: Medicare HMO | Admitting: Nurse Practitioner

## 2018-07-01 VITALS — BP 120/76 | HR 80 | Temp 97.9°F | Ht 70.0 in | Wt 204.0 lb

## 2018-07-01 DIAGNOSIS — J302 Other seasonal allergic rhinitis: Secondary | ICD-10-CM | POA: Diagnosis not present

## 2018-07-01 DIAGNOSIS — J329 Chronic sinusitis, unspecified: Secondary | ICD-10-CM | POA: Diagnosis not present

## 2018-07-01 MED ORDER — FLUTICASONE PROPIONATE 50 MCG/ACT NA SUSP: 2.0000 | Freq: Every day | NASAL | 6 refills | Status: DC

## 2018-07-01 MED ORDER — ZOLPIDEM TARTRATE 10 MG PO TABS: ORAL_TABLET | ORAL | 0 refills | Status: DC

## 2018-07-01 NOTE — Addendum Note (Signed)
Addended by: Denyse Amass on: 07/01/2018 09:09 AM   Modules accepted: Orders

## 2018-07-01 NOTE — Patient Instructions (Addendum)
neti pot twice daily Plain nasal saline spray throughout the day as needed To use NOREL AD 1 tablet every 6 hours as needed for congestion- this has tylenol in it 325 mg - can take additional tylenol 325 mg every 6 hours as needed for sore throat Mucinex DM by mouth twice daily as needed for cough and congestion with full glass of water  Keep well hydrated Can use delsym 5 ml by mouth every 12 hours.  Avoid forcefully blowing nose   Sinusitis, Adult Sinusitis is soreness and inflammation of your sinuses. Sinuses are hollow spaces in the bones around your face. Your sinuses are located:  Around your eyes.  In the middle of your forehead.  Behind your nose.  In your cheekbones.  Your sinuses and nasal passages are lined with a stringy fluid (mucus). Mucus normally drains out of your sinuses. When your nasal tissues become inflamed or swollen, the mucus can become trapped or blocked so air cannot flow through your sinuses. This allows bacteria, viruses, and funguses to grow, which leads to infection. Sinusitis can develop quickly and last for 7?10 days (acute) or for more than 12 weeks (chronic). Sinusitis often develops after a cold. What are the causes? This condition is caused by anything that creates swelling in the sinuses or stops mucus from draining, including:  Allergies.  Asthma.  Bacterial or viral infection.  Abnormally shaped bones between the nasal passages.  Nasal growths that contain mucus (nasal polyps).  Narrow sinus openings.  Pollutants, such as chemicals or irritants in the air.  A foreign object stuck in the nose.  A fungal infection. This is rare.  What increases the risk? The following factors may make you more likely to develop this condition:  Having allergies or asthma.  Having had a recent cold or respiratory tract infection.  Having structural deformities or blockages in your nose or sinuses.  Having a weak immune system.  Doing a lot of  swimming or diving.  Overusing nasal sprays.  Smoking.  What are the signs or symptoms? The main symptoms of this condition are pain and a feeling of pressure around the affected sinuses. Other symptoms include:  Upper toothache.  Earache.  Headache.  Bad breath.  Decreased sense of smell and taste.  A cough that may get worse at night.  Fatigue.  Fever.  Thick drainage from your nose. The drainage is often green and it may contain pus (purulent).  Stuffy nose or congestion.  Postnasal drip. This is when extra mucus collects in the throat or back of the nose.  Swelling and warmth over the affected sinuses.  Sore throat.  Sensitivity to light.  How is this diagnosed? This condition is diagnosed based on symptoms, a medical history, and a physical exam. To find out if your condition is acute or chronic, your health care provider may:  Look in your nose for signs of nasal polyps.  Tap over the affected sinus to check for signs of infection.  View the inside of your sinuses using an imaging device that has a light attached (endoscope).  If your health care provider suspects that you have chronic sinusitis, you may also:  Be tested for allergies.  Have a sample of mucus taken from your nose (nasal culture) and checked for bacteria.  Have a mucus sample examined to see if your sinusitis is related to an allergy.  If your sinusitis does not respond to treatment and it lasts longer than 8 weeks, you may  have an MRI or CT scan to check your sinuses. These scans also help to determine how severe your infection is. In rare cases, a bone biopsy may be done to rule out more serious types of fungal sinus disease. How is this treated? Treatment for sinusitis depends on the cause and whether your condition is chronic or acute. If a virus is causing your sinusitis, your symptoms will go away on their own within 10 days. You may be given medicines to relieve your symptoms,  including:  Topical nasal decongestants. They shrink swollen nasal passages and let mucus drain from your sinuses.  Antihistamines. These drugs block inflammation that is triggered by allergies. This can help to ease swelling in your nose and sinuses.  Topical nasal corticosteroids. These are nasal sprays that ease inflammation and swelling in your nose and sinuses.  Nasal saline washes. These rinses can help to get rid of thick mucus in your nose.  If your condition is caused by bacteria, you will be given an antibiotic medicine. If your condition is caused by a fungus, you will be given an antifungal medicine. Surgery may be needed to correct underlying conditions, such as narrow nasal passages. Surgery may also be needed to remove polyps. Follow these instructions at home: Medicines  Take, use, or apply over-the-counter and prescription medicines only as told by your health care provider. These may include nasal sprays.  If you were prescribed an antibiotic medicine, take it as told by your health care provider. Do not stop taking the antibiotic even if you start to feel better. Hydrate and Humidify  Drink enough water to keep your urine clear or pale yellow. Staying hydrated will help to thin your mucus.  Use a cool mist humidifier to keep the humidity level in your home above 50%.  Inhale steam for 10-15 minutes, 3-4 times a day or as told by your health care provider. You can do this in the bathroom while a hot shower is running.  Limit your exposure to cool or dry air. Rest  Rest as much as possible.  Sleep with your head raised (elevated).  Make sure to get enough sleep each night. General instructions  Apply a warm, moist washcloth to your face 3-4 times a day or as told by your health care provider. This will help with discomfort.  Wash your hands often with soap and water to reduce your exposure to viruses and other germs. If soap and water are not available, use hand  sanitizer.  Do not smoke. Avoid being around people who are smoking (secondhand smoke).  Keep all follow-up visits as told by your health care provider. This is important. Contact a health care provider if:  You have a fever.  Your symptoms get worse.  Your symptoms do not improve within 10 days. Get help right away if:  You have a severe headache.  You have persistent vomiting.  You have pain or swelling around your face or eyes.  You have vision problems.  You develop confusion.  Your neck is stiff.  You have trouble breathing. This information is not intended to replace advice given to you by your health care provider. Make sure you discuss any questions you have with your health care provider. Document Released: 12/17/2005 Document Revised: 08/12/2016 Document Reviewed: 10/12/2015 Elsevier Interactive Patient Education  [employer].

## 2018-07-01 NOTE — Telephone Encounter (Signed)
RF requested for ambien. [facility] shows last filled 05/29/18 #30 0RF. [facility] It won't let me e-scribe it. It has to be sent by you. Thanks

## 2018-07-01 NOTE — Progress Notes (Signed)
Careteam: Patient Care Team: [name], NP as PCP - General (Geriatric Medicine)  Advanced Directive information Does Patient Have a Medical Advance Directive?: Yes, Type of Advance Directive: Living will  No Known Allergies  Chief Complaint  Patient presents with  . Acute Visit    Pt is being seen due to cough, sore throat, head congestion for 3 days. Pt is taking mucinex relief every 4 hours.      HPI: Patient is a 68 y.o. male seen in the office today due to cough and congestion.  Pt was recently seen 06/11/18 and placed on azithromycin he was doing better but then started feeling worse after sleeping under air unit.  "feels like I have a cold" Has never seen ENT.  No fever or chills.  Taking mucinex every 4 hours.  Has not been taking flonase or antihistamine.    Review of Systems:  Review of Systems  Constitutional: Negative for chills, fever and malaise/fatigue.  HENT: Positive for congestion and sore throat. Negative for ear discharge, ear pain and sinus pain.        Runny nose  Eyes:       Itchy eyes, no drainage  Respiratory: Positive for cough and sputum production. Negative for shortness of breath and wheezing.   Cardiovascular: Negative for chest pain and palpitations.  Neurological: Negative for dizziness and headaches.    Past Medical History:  Diagnosis Date  . Backache, unspecified   . Bronchitis   . Cough variant asthma 05/03/2017  . Genital herpes   . Gout   . Hearing loss   . Hemorrhoids   . Hernia   . Herpes simplex   . Hyperglycemia   . Hyperlipidemia   . Hypertension   . Impotence   . Insomnia, unspecified   . Osteoarthrosis, unspecified whether generalized or localized, unspecified site   . Tinea pedis, right   . Unspecified disorder of male genital organs    Past Surgical History:  Procedure Laterality Date  . fracture left femur  1984  . ganglion repair of left forarm  2000  . IVP  57   ML old comp fracture  . NASAL  SEPTUM SURGERY  1997  . TONSILLECTOMY  1959  . UMBILICAL HERNIA REPAIR  2002   Social History:   reports that he quit smoking about 34 years ago. He has a 15.00 pack-year smoking history. He has never used smokeless tobacco. He reports that he drinks about 3.6 - 4.2 oz of alcohol per week. He reports that he does not use drugs.  Family History  Problem Relation Age of Onset  . COPD Mother   . Aortic aneurysm Father     Medications: Patient's Medications  New Prescriptions   No medications on file  Previous Medications   ACYCLOVIR (ZOVIRAX) 400 MG TABLET    TAKE 1 TO PREVENT HERPES AND TAKE 5 TABLETS PER DAY FOR ACTIVE HERPES   AMLODIPINE (NORVASC) 5 MG TABLET    TAKE 1 TABLET BY MOUTH EVERY DAY TO CONTROL BLOOD PRESSURE   ASPIRIN EC 81 MG TABLET    Take 81 mg by mouth daily.   ATORVASTATIN (LIPITOR) 20 MG TABLET    Take 1 tablet (20 mg total) by mouth daily.   FEBUXOSTAT (ULORIC) 40 MG TABLET    Take 1 tablet (40 mg total) by mouth daily.   FLUTICASONE (FLONASE) 50 MCG/ACT NASAL SPRAY    Place into both nostrils. One spray both nostrils at bedtime  IBUPROFEN (ADVIL,MOTRIN) 400 MG TABLET    Take 1 tablet (400 mg total) by mouth every 6 (six) hours as needed.   LOSARTAN (COZAAR) 100 MG TABLET    TAKE ONE TABLET BY MOUTH ONCE DAILY TO CONTROL BLOOD PRESSURE   TADALAFIL (CIALIS) 20 MG TABLET    Take 1 tablet (20 mg total) by mouth daily as needed for erectile dysfunction.   ZOLPIDEM (AMBIEN) 10 MG TABLET    Take one tablet by mouth at bedtime as needed for sleep  Modified Medications   No medications on file  Discontinued Medications   AZITHROMYCIN (ZITHROMAX) 250 MG TABLET    2 tablets today then 1 tablet daily until complete     Physical Exam:  Vitals:   07/01/18 1446  BP: 120/76  Pulse: 80  Temp: 97.9 F (36.6 C)  TempSrc: Oral  SpO2: 97%  Weight: 204 lb (92.5 kg)  Height: 5\' 10"  (1.778 m)   Body mass index is 29.27 kg/m.  Physical Exam  Constitutional: He is  oriented to person, place, and time. He appears well-developed and well-nourished.  HENT:  Head: Normocephalic and atraumatic.  Right Ear: External ear normal.  Left Ear: External ear normal.  Nose: Mucosal edema and rhinorrhea present.  Mouth/Throat: Posterior oropharyngeal erythema (no edema or exudate ) present.  Eyes: Pupils are equal, round, and reactive to light. EOM are normal.  Neck: Normal range of motion. Neck supple.  Cardiovascular: Normal rate, regular rhythm and normal heart sounds.  Pulmonary/Chest: Effort normal and breath sounds normal.  Neurological: He is alert and oriented to person, place, and time.  Skin: Skin is warm and dry.    Labs reviewed: Basic Metabolic Panel: Recent Labs    11/12/17 0833 06/04/18 0852  NA 136 140  K 4.6 4.7  CL 101 104  CO2 26 25  GLUCOSE 104* 86  BUN 13 16  CREATININE 0.87 0.78  CALCIUM 9.3 9.3   Liver Function Tests: Recent Labs    11/12/17 0833 06/04/18 0852  AST 26 31  ALT 37 40  BILITOT 0.4 0.3  PROT 6.9 6.8   No results for input(s): LIPASE, AMYLASE in the last 8760 hours. No results for input(s): AMMONIA in the last 8760 hours. CBC: Recent Labs    06/04/18 0852  WBC 6.3  NEUTROABS 2,703  HGB 16.1  HCT 46.2  MCV 93.5  PLT 299   Lipid Panel: Recent Labs    11/12/17 0833 06/04/18 0852  CHOL 255* 268*  HDL 56 52  LDLCALC 162* 178*  TRIG 210* 213*  CHOLHDL 4.6 5.2*   TSH: No results for input(s): TSH in the last 8760 hours. A1C: Lab Results  Component Value Date   HGBA1C 5.5 09/12/2015     Assessment/Plan 1. Sinusitis, unspecified chronicity, unspecified location -3 days of increase nasal congestion, will treat symptomatically at this time.  -not using flonase at this time, to restart this, Rx sent to pharmacy. -neti pot twice daily Plain nasal saline spray throughout the day as needed humidifier in the home to help with the dry air norel AD by mouth every 6 hours as needed if taking norel  AD to May use additional tylenol 325 mg 1 tablets every 6 hours as needed aches and pains or sore throat Keep well hydrated Avoid forcefully blowing nose -to notify office if symptoms do not improve or worsen- if symptoms progress may need to add antibiotic, was recently treated with improvement.  -discussed possible ENT referral if symptoms  do not improve  2. Seasonal allergies Zyrtec 10 mg by mouth daily  flonase 2 sprays into bilateral nares daily  Next appt: 08/29/2018 Janett Billow K. [place], [facility] [phone]

## 2018-07-25 ENCOUNTER — Ambulatory Visit: Payer: Medicare HMO | Admitting: Internal Medicine

## 2018-08-03 ENCOUNTER — Other Ambulatory Visit: Payer: Self-pay | Admitting: Nurse Practitioner

## 2018-08-05 ENCOUNTER — Ambulatory Visit: Payer: Self-pay

## 2018-08-14 ENCOUNTER — Other Ambulatory Visit: Payer: Self-pay | Admitting: *Deleted

## 2018-08-14 MED ORDER — ZOLPIDEM TARTRATE 10 MG PO TABS: ORAL_TABLET | ORAL | 0 refills | Status: DC

## 2018-08-14 NOTE — Telephone Encounter (Signed)
Patient requested refill [place] Database Verified LR: 07/01/2018

## 2018-08-21 ENCOUNTER — Ambulatory Visit (INDEPENDENT_AMBULATORY_CARE_PROVIDER_SITE_OTHER): Payer: Medicare HMO | Admitting: Family

## 2018-08-21 ENCOUNTER — Encounter: Payer: Self-pay | Admitting: Family

## 2018-08-21 VITALS — BP 144/82 | HR 74 | Temp 97.9°F | Resp 16 | Ht 70.0 in | Wt 206.2 lb

## 2018-08-21 DIAGNOSIS — J329 Chronic sinusitis, unspecified: Secondary | ICD-10-CM

## 2018-08-21 DIAGNOSIS — M1A9XX0 Chronic gout, unspecified, without tophus (tophi): Secondary | ICD-10-CM

## 2018-08-21 DIAGNOSIS — J069 Acute upper respiratory infection, unspecified: Secondary | ICD-10-CM | POA: Diagnosis not present

## 2018-08-21 MED ORDER — ALBUTEROL SULFATE HFA 108 (90 BASE) MCG/ACT IN AERS: 2.0000 | INHALATION_SPRAY | Freq: Four times a day (QID) | RESPIRATORY_TRACT | 0 refills | Status: DC | PRN

## 2018-08-21 MED ORDER — CETIRIZINE HCL 10 MG PO TABS: 10.0000 mg | ORAL_TABLET | Freq: Every day | ORAL | 11 refills | Status: DC

## 2018-08-21 NOTE — Patient Instructions (Addendum)
1. Use Albuterol inhaler inhaler 2 puffs into lungs every 6 hours as needed for cough,wheezing or shortness of breath 2. Chest X-ray ordered today to rule out Pneumonia  3. Take OTC Zyrtec 10 mg tablet one by mouth daily at bedtime  4. Uloric 40 mg tablet one by mouth daily samples provided. # 14 tablets.  5. Continue on mucinex as needed.  6. Follow up in two weeks to evaluate cough.

## 2018-08-21 NOTE — Progress Notes (Signed)
Provider: Iyani Dresner FNP-C  [name], NP  Patient Care Team: [name], NP as PCP - General (Geriatric Medicine)  Extended Emergency Contact Information Primary Emergency Contact: Daoughty,Sandy  Johnnette Litter of [place] Phone: [phone] Mobile Phone: [phone] Relation: None  Code Status:  DNR Goals of care: Advanced Directive information Advanced Directives 08/21/2018  Does Patient Have a Medical Advance Directive? Yes  Type of Advance Directive Living will  Does patient want to make changes to medical advance directive? -  Copy of [place] in Chart? -  Would patient like information on creating a medical advance directive? -     Chief Complaint  Patient presents with  . Cough    sinus drainage    HPI:  Pt is a 68 y.o. male seen today at [facility] office for  an acute visit for evaluation of sinus drainage and cough.He states has had sinus drainage and cough for the past two months.sinus drainage started two months ago after swimming in the gym that was so cold.sinus drainage and cough worst at night when he lays down to sleep.He gets short of breath and wheezes when lying down.He was seen here at [facility] 07/01/2018 for sinusitis and was started on Flonase daily.He states has not been using Flonase but used it last night with much improvement.He denies any fever,chills or chest pain.of note he has a significant history of smoking. He also request for Uloric samples states ordered medication via mail but it takes six days to be delivered.    Past Medical History:  Diagnosis Date  . Backache, unspecified   . Bronchitis   . Cough variant asthma 05/03/2017  . Genital herpes   . Gout   . Hearing loss   . Hemorrhoids   . Hernia   . Herpes simplex   . Hyperglycemia   . Hyperlipidemia   . Hypertension   . Impotence   . Insomnia, unspecified   . Osteoarthrosis, unspecified whether generalized or localized, unspecified site   .  Tinea pedis, right   . Unspecified disorder of male genital organs    Past Surgical History:  Procedure Laterality Date  . fracture left femur  1984  . ganglion repair of left forarm  2000  . IVP  46   ML old comp fracture  . NASAL SEPTUM SURGERY  1997  . TONSILLECTOMY  1959  . UMBILICAL HERNIA REPAIR  2002    No Known Allergies  Outpatient Encounter Medications as of 08/21/2018  Medication Sig  . acyclovir (ZOVIRAX) 400 MG tablet TAKE 1 TO PREVENT HERPES AND TAKE 5 TABLETS PER DAY FOR ACTIVE HERPES  . amLODipine (NORVASC) 5 MG tablet TAKE 1 TABLET BY MOUTH EVERY DAY TO CONTROL BLOOD PRESSURE  . aspirin EC 81 MG tablet Take 81 mg by mouth daily.  Marland Kitchen atorvastatin (LIPITOR) 20 MG tablet Take 1 tablet (20 mg total) by mouth daily.  . febuxostat (ULORIC) 40 MG tablet Take 1 tablet (40 mg total) by mouth daily.  . fluticasone (FLONASE) 50 MCG/ACT nasal spray Place 2 sprays into both nostrils daily.  Marland Kitchen ibuprofen (ADVIL,MOTRIN) 400 MG tablet Take 1 tablet (400 mg total) by mouth every 6 (six) hours as needed.  Marland Kitchen losartan (COZAAR) 100 MG tablet TAKE ONE TABLET BY MOUTH ONCE DAILY TO CONTROL BLOOD PRESSURE  . tadalafil (CIALIS) 20 MG tablet Take 1 tablet (20 mg total) by mouth daily as needed for erectile dysfunction.  Marland Kitchen zolpidem (AMBIEN) 10 MG tablet  Take one tablet by mouth at bedtime as needed for sleep  . [DISCONTINUED] acyclovir (ZOVIRAX) 400 MG tablet TAKE 1 TO PREVENT HERPES AND TAKE 5 TABLETS PER DAY FOR ACTIVE HERPES   No facility-administered encounter medications on file as of 08/21/2018.     Review of Systems  Constitutional: Negative for appetite change, chills, fatigue and fever.  HENT: Negative for congestion, postnasal drip, rhinorrhea, sinus pressure, sinus pain, sneezing, sore throat and trouble swallowing.   Eyes: Negative for discharge, redness and itching.  Respiratory: Positive for cough and wheezing. Negative for chest tightness.        Shortness of breath worst at  night   Cardiovascular: Negative for chest pain, palpitations and leg swelling.  Gastrointestinal: Negative for abdominal distention, abdominal pain, constipation, nausea and vomiting.  Skin: Negative for color change, pallor and rash.  Neurological: Negative for dizziness, light-headedness and headaches.  Psychiatric/Behavioral: Negative for sleep disturbance. The patient is not nervous/anxious.     Immunization History  Administered Date(s) Administered  . Influenza, High Dose Seasonal PF 10/17/2017  . Influenza,inj,Quad PF,6+ Mos 12/02/2013, 03/07/2016, 10/10/2016  . Influenza-Unspecified 12/05/2011  . Pneumococcal Conjugate-13 08/26/2017  . Td 12/31/1994, 12/03/2013  . Zoster Recombinat (Shingrix) 04/15/2018   Pertinent  Health Maintenance Due  Topic Date Due  . INFLUENZA VACCINE  07/31/2018  . PNA vac Low Risk Adult (2 of 2 - PPSV23) 08/26/2018  . COLONOSCOPY  11/18/2022   Fall Risk  07/01/2018 06/02/2018 01/13/2018 11/11/2017 08/26/2017  Falls in the past year? No No No Yes Yes  Number falls in past yr: - - - 1 1  Comment - - - fell off porch in the rain -  Injury with Fall? - - - Yes Yes  Comment - - - sprained left wrist L wrist    Vitals:   08/21/18 1126  BP: (!) 144/82  Pulse: 74  Resp: 16  Temp: 97.9 F (36.6 C)  TempSrc: Oral  SpO2: 94%  Weight: 206 lb 3.2 oz (93.5 kg)  Height: 5\' 10"  (1.778 m)   Body mass index is 29.59 kg/m. Physical Exam  Constitutional: He is oriented to person, place, and time. He appears well-developed and well-nourished. No distress.  HENT:  Head: Normocephalic.  Right Ear: External ear normal.  Left Ear: External ear normal.  Nose: Nose normal.  Mouth/Throat: Oropharynx is clear and moist. No oropharyngeal exudate.  Neck: Normal range of motion. No JVD present.  Cardiovascular: Normal rate, regular rhythm, normal heart sounds and intact distal pulses. Exam reveals no gallop and no friction rub.  No murmur heard. Pulmonary/Chest:  Effort normal. No respiratory distress. He has wheezes. He has no rales. He exhibits no tenderness.  Diminished breath sounds to right base.Brochial exp.wheezes noted.  Abdominal: Soft. Bowel sounds are normal. He exhibits no distension and no mass. There is no tenderness. There is no rebound and no guarding.  Musculoskeletal: Normal range of motion. He exhibits no edema or tenderness.  Lymphadenopathy:    He has no cervical adenopathy.  Neurological: He is oriented to person, place, and time.  Skin: Skin is warm and dry. No rash noted. No erythema. No pallor.  Psychiatric: He has a normal mood and affect. His behavior is normal. Judgment and thought content normal.  Vitals reviewed.   Labs reviewed: Recent Labs    11/12/17 0833 06/04/18 0852  NA 136 140  K 4.6 4.7  CL 101 104  CO2 26 25  GLUCOSE 104* 86  BUN 13 16  CREATININE 0.87 0.78  CALCIUM 9.3 9.3   Recent Labs    11/12/17 0833 06/04/18 0852  AST 26 31  ALT 37 40  BILITOT 0.4 0.3  PROT 6.9 6.8   Recent Labs    06/04/18 0852  WBC 6.3  NEUTROABS 2,703  HGB 16.1  HCT 46.2  MCV 93.5  PLT 299   No results found for: TSH Lab Results  Component Value Date   HGBA1C 5.5 09/12/2015   Lab Results  Component Value Date   CHOL 268 (H) 06/04/2018   HDL 52 06/04/2018   LDLCALC 178 (H) 06/04/2018   TRIG 213 (H) 06/04/2018   CHOLHDL 5.2 (H) 06/04/2018    Significant Diagnostic Results in last 30 days:  No results found.  Assessment/Plan 1. Upper Respiratory tract infection unspecified type  Afebrile.worsening cough with associated shortness of breath at night.diminished breath sounds to right base and bronchial exp.wheezing noted on auscultation.treated in the past with oral azithromycin. - will obtain Chest X-ray 2 views to rule pneumonia given history of smoking. Continue on mucinex as directed. - albuterol  HFA 108 mcg/ACT inhlaer inhale 2 puffs every 6 hours as needed for wheezing or cough. - Notify  provider if running fever 100.5 or greater. - follow up in 2 weeks to evaluate cough.  2.sinus drainage  Chronic.could be contributory factor to cough.used Flonase last night with relief.Negative for sinus pressure or tenderness.continue on Flonase for now.  3. Gout  Awaiting uloric to be delivered from mail request samples.Uloric 40 mg tablet # 14 tablets given today.   Family/ staff Communication: Reviewed plan of care with patient.  Labs/tests ordered: CXR 2 views rule out Pneumonia    Follow appointment: 2 weeks   Sandrea Hughs, NP

## 2018-08-26 ENCOUNTER — Telehealth: Payer: Self-pay | Admitting: *Deleted

## 2018-08-26 NOTE — Telephone Encounter (Signed)
Spoke with patient about not having his CXR results. He said he never had it done. He said he went the day of his OV and had to wait for a long time. He finally just decided to leave. He said the inhaler and nose spray are working well and he was feeling better. He said since he was improving, he wasn't going to get it done. He said he would keep the scheduled follow up.

## 2018-08-29 ENCOUNTER — Ambulatory Visit: Payer: Medicare HMO

## 2018-08-30 ENCOUNTER — Other Ambulatory Visit: Payer: Self-pay | Admitting: Nurse Practitioner

## 2018-09-03 ENCOUNTER — Ambulatory Visit: Payer: Self-pay

## 2018-09-04 ENCOUNTER — Ambulatory Visit: Payer: Medicare HMO | Admitting: Family

## 2018-09-05 ENCOUNTER — Ambulatory Visit (INDEPENDENT_AMBULATORY_CARE_PROVIDER_SITE_OTHER): Payer: Medicare HMO

## 2018-09-05 ENCOUNTER — Other Ambulatory Visit: Payer: Medicare HMO

## 2018-09-05 VITALS — BP 164/82 | HR 76 | Temp 97.6°F | Ht 70.0 in | Wt 205.0 lb

## 2018-09-05 DIAGNOSIS — Z0000 Encounter for general adult medical examination without abnormal findings: Secondary | ICD-10-CM | POA: Diagnosis not present

## 2018-09-05 DIAGNOSIS — E782 Mixed hyperlipidemia: Secondary | ICD-10-CM | POA: Diagnosis not present

## 2018-09-05 DIAGNOSIS — Z23 Encounter for immunization: Secondary | ICD-10-CM

## 2018-09-05 LAB — COMPREHENSIVE METABOLIC PANEL
AG RATIO: 1.9 (calc) (ref 1.0–2.5)
ALT: 31 U/L (ref 9–46)
AST: 29 U/L (ref 10–35)
Albumin: 4.6 g/dL (ref 3.6–5.1)
Alkaline phosphatase (APISO): 96 U/L (ref 40–115)
BILIRUBIN TOTAL: 0.6 mg/dL (ref 0.2–1.2)
BUN: 13 mg/dL (ref 7–25)
CALCIUM: 9.5 mg/dL (ref 8.6–10.3)
CO2: 27 mmol/L (ref 20–32)
Chloride: 102 mmol/L (ref 98–110)
Creat: 0.88 mg/dL (ref 0.70–1.25)
GLOBULIN: 2.4 g/dL (ref 1.9–3.7)
Glucose, Bld: 102 mg/dL — ABNORMAL HIGH (ref 65–99)
Potassium: 4.6 mmol/L (ref 3.5–5.3)
Sodium: 138 mmol/L (ref 135–146)
Total Protein: 7 g/dL (ref 6.1–8.1)

## 2018-09-05 LAB — LIPID PANEL
Cholesterol: 206 mg/dL — ABNORMAL HIGH (ref <200)
HDL: 63 mg/dL (ref >40)
LDL Cholesterol (Calc): 118 mg/dL (calc) — ABNORMAL HIGH
NON-HDL CHOLESTEROL (CALC): 143 mg/dL — AB (ref <130)
TRIGLYCERIDES: 133 mg/dL (ref <150)
Total CHOL/HDL Ratio: 3.3 (calc) (ref <5.0)

## 2018-09-05 NOTE — Progress Notes (Signed)
Subjective:   Rodney Foster is a 68 y.o. male who presents for Medicare Annual/Subsequent preventive examination.  Last AWV-08/26/2017    Objective:    Vitals: BP (!) 164/82 (BP Location: Right Arm, Patient Position: Sitting)   Pulse 76   Temp 97.6 F (36.4 C) (Oral)   Ht 5\' 10"  (1.778 m)   Wt 205 lb (93 kg)   SpO2 95%   BMI 29.41 kg/m   Body mass index is 29.41 kg/m. Patient did not take BP medication yet today. Provider notified  Advanced Directives 09/05/2018 08/21/2018 07/01/2018 06/02/2018 11/11/2017 08/26/2017 05/23/2017  Does Patient Have a Medical Advance Directive? Yes Yes Yes Yes Yes Yes Yes  Type of Advance Directive Living will Living will Living will Living will Living will Living will Living will  Does patient want to make changes to medical advance directive? No - Patient declined - - - Yes (MAU/Ambulatory/Procedural Areas - Information given) No - Patient declined -  Copy of [place] in [place]  Would patient like information on creating a medical advance directive? - - - - - - -    Tobacco Social History   Tobacco Use  Smoking Status Former Smoker  . Packs/day: 1.00  . Years: 15.00  . Pack years: 15.00  . Last attempt to quit: 01/01/1984  . Years since quitting: 34.7  Smokeless Tobacco Never Used     Counseling given: Not Answered   Clinical Intake:  Pre-visit preparation completed: No  Pain : No/denies pain     Nutritional Risks: None Diabetes: No  How often do you need to have someone help you when you read instructions, pamphlets, or other written materials from your doctor or pharmacy?: 1 - Never What is the last grade level you completed in school?: HS  Interpreter Needed?: No  Information entered by :: Tyson Dense ,RN  Past Medical History:  Diagnosis Date  . Backache, unspecified   . Bronchitis   . Cough variant asthma 05/03/2017  . Genital herpes   . Gout   . Hearing loss   . Hemorrhoids   . Hernia    . Herpes simplex   . Hyperglycemia   . Hyperlipidemia   . Hypertension   . Impotence   . Insomnia, unspecified   . Osteoarthrosis, unspecified whether generalized or localized, unspecified site   . Tinea pedis, right   . Unspecified disorder of male genital organs    Past Surgical History:  Procedure Laterality Date  . fracture left femur  1984  . ganglion repair of left forarm  2000  . IVP  42   ML old comp fracture  . NASAL SEPTUM SURGERY  1997  . TONSILLECTOMY  1959  . UMBILICAL HERNIA REPAIR  2002   Family History  Problem Relation Age of Onset  . COPD Mother   . Aortic aneurysm Father    Social History   Socioeconomic History  . Marital status: Divorced    Spouse name: Not on file  . Number of children: Not on file  . Years of education: Not on file  . Highest education level: Not on file  Occupational History  . Not on file  Social Needs  . Financial resource strain: Not hard at all  . Food insecurity:    Worry: Never true    Inability: Never true  . Transportation needs:    Medical: No    Non-medical: No  Tobacco Use  .  Smoking status: Former Smoker    Packs/day: 1.00    Years: 15.00    Pack years: 15.00    Last attempt to quit: 01/01/1984    Years since quitting: 34.7  . Smokeless tobacco: Never Used  Substance and Sexual Activity  . Alcohol use: Yes    Alcohol/week: 6.0 - 7.0 standard drinks    Types: 6 - 7 Glasses of wine per week    Comment: 3-4 a night of wine or coct[place]  . Drug use: No  . Sexual activity: Yes  Lifestyle  . Physical activity:    Days per week: 4 days    Minutes per session: 30 min  . Stress: Only a little  Relationships  . Social connections:    Talks on phone: More than three times a week    Gets together: More than three times a week    Attends religious service: Never    Active member of club or organization: No    Attends meetings of clubs or organizations: Never    Relationship status: Divorced  Other Topics  Concern  . Not on file  Social History Narrative  . Not on file    Outpatient Encounter Medications as of 09/05/2018  Medication Sig  . acyclovir (ZOVIRAX) 400 MG tablet TAKE 1 TO PREVENT HERPES AND TAKE 5 TABLETS PER DAY FOR ACTIVE HERPES  . albuterol (PROVENTIL HFA;VENTOLIN HFA) 108 (90 Base) MCG/ACT inhaler Inhale 2 puffs into the lungs every 6 (six) hours as needed for wheezing or shortness of breath.  Marland Kitchen amLODipine (NORVASC) 5 MG tablet TAKE 1 TABLET BY MOUTH EVERY DAY TO CONTROL BLOOD PRESSURE  . aspirin EC 81 MG tablet Take 81 mg by mouth daily.  Marland Kitchen atorvastatin (LIPITOR) 20 MG tablet Take 1 tablet (20 mg total) by mouth daily.  . cetirizine (ZYRTEC) 10 MG tablet Take 1 tablet (10 mg total) by mouth at bedtime.  . febuxostat (ULORIC) 40 MG tablet Take 1 tablet (40 mg total) by mouth daily.  . fluticasone (FLONASE) 50 MCG/ACT nasal spray Place 2 sprays into both nostrils daily.  Marland Kitchen ibuprofen (ADVIL,MOTRIN) 400 MG tablet Take 1 tablet (400 mg total) by mouth every 6 (six) hours as needed.  Marland Kitchen losartan (COZAAR) 100 MG tablet TAKE ONE TABLET BY MOUTH ONCE DAILY TO CONTROL BLOOD PRESSURE  . tadalafil (CIALIS) 20 MG tablet Take 1 tablet (20 mg total) by mouth daily as needed for erectile dysfunction.  Marland Kitchen zolpidem (AMBIEN) 10 MG tablet Take one tablet by mouth at bedtime as needed for sleep  . acyclovir (ZOVIRAX) 400 MG tablet TAKE 1 TO PREVENT HERPES AND TAKE 5 TABLETS PER DAY FOR ACTIVE HERPES   No facility-administered encounter medications on file as of 09/05/2018.     Activities of Daily Living In your present state of health, do you have any difficulty performing the following activities: 09/05/2018  Hearing? N  Vision? N  Difficulty concentrating or making decisions? N  Walking or climbing stairs? N  Dressing or bathing? N  Doing errands, shopping? N  Preparing Food and eating ? N  Using the Toilet? N  In the past six months, have you accidently leaked urine? N  Do you have problems  with loss of bowel control? N  Managing your Medications? N  Managing your Finances? N  Housekeeping or managing your Housekeeping? N  Some recent data might be hidden    Patient Care Team: [name], NP as PCP - General (Geriatric Medicine)  Assessment:   This is a routine wellness examination for Jamy.  Exercise Activities and Dietary recommendations Current Exercise Habits: Home exercise routine, Type of exercise: walking;strength training/weights, Time (Minutes): 30, Frequency (Times/Week): 4, Weekly Exercise (Minutes/Week): 120, Exercise limited by: None identified  Goals    . Exercise 3x per week (30 min per time)     Patient will go back to the gym       Fall Risk Fall Risk  09/05/2018 07/01/2018 06/02/2018 01/13/2018 11/11/2017  Falls in the past year? No No No No Yes  Number falls in past yr: - - - - 1  Comment - - - - fell off porch in the rain  Injury with Fall? - - - - Yes  Comment - - - - sprained left wrist   Is the patient's home free of loose throw rugs in walkways, pet beds, electrical cords, etc?   yes      Grab bars in the bathroom? no      Handrails on the stairs?   yes      Adequate lighting?   yes  Depression Screen PHQ 2/9 Scores 09/05/2018 06/02/2018 08/26/2017 10/10/2016  PHQ - 2 Score 0 0 0 0    Cognitive Function MMSE - Mini Mental State Exam 09/05/2018 08/26/2017 10/10/2016  Orientation to time 5 5 5   Orientation to Place 5 5 5   Registration 3 3 3   Attention/ Calculation 5 5 5   Recall 2 2 3   Language- name 2 objects 2 2 2   Language- repeat 1 1 1   Language- follow 3 step command 3 2 3   Language- read & follow direction 1 1 1   Write a sentence 1 1 1   Copy design 1 1 1   Total score 29 28 30         Immunization History  Administered Date(s) Administered  . Influenza, High Dose Seasonal PF 10/17/2017, 09/05/2018  . Influenza,inj,Quad PF,6+ Mos 12/02/2013, 03/07/2016, 10/10/2016  . Influenza-Unspecified 12/05/2011  . Pneumococcal  Conjugate-13 08/26/2017  . Pneumococcal Polysaccharide-23 09/05/2018  . Td 12/31/1994, 12/03/2013  . Zoster Recombinat (Shingrix) 04/15/2018, 08/19/2018    Qualifies for Shingles Vaccine? Up to date, completed  Screening Tests Health Maintenance  Topic Date Due  . Hepatitis C Screening  11/11/2018 (Originally 07-18-1950)  . COLONOSCOPY  11/18/2022  . TETANUS/TDAP  12/04/2023  . INFLUENZA VACCINE  Completed  . PNA vac Low Risk Adult  Completed   Cancer Screenings: Lung: Low Dose CT Chest recommended if Age 12-80 years, 30 pack-year currently smoking OR have quit w/in 15years. Patient does not qualify. Colorectal: up to date  Additional Screenings:  Hepatitis C Screening: declined  High dose flu vaccine due: given today Pneumovax due: given today    Plan:    I have personally reviewed and addressed the Medicare Annual Wellness questionnaire and have noted the following in the patient's chart:  A. Medical and social history B. Use of alcohol, tobacco or illicit drugs  C. Current medications and supplements D. Functional ability and status E.  Nutritional status F.  Physical activity G. Advance directives H. List of other physicians I.  Hospitalizations, surgeries, and ER visits in previous 12 months J.  [place] to include hearing, vision, cognitive, depression L. Referrals and appointments - none  In addition, I have reviewed and discussed with patient certain preventive protocols, quality metrics, and best practice recommendations. A written personalized care plan for preventive services as well as general preventive health recommendations were provided to patient.  See attached scanned questionnaire for additional information.   Signed,   Tyson Dense, RN Nurse Health Advisor  Patient Concerns: None

## 2018-09-05 NOTE — Patient Instructions (Addendum)
Rodney Foster , Thank you for taking time to come for your Medicare Wellness Visit. I appreciate your ongoing commitment to your health goals. Please review the following plan we discussed and let me know if I can assist you in the future.   Screening recommendations/referrals: Colonoscopy up to date, due 11/18/2022 Recommended yearly ophthalmology/optometry visit for glaucoma screening and checkup Recommended yearly dental visit for hygiene and checkup  Vaccinations: Influenza vaccine given today Pneumococcal vaccine 23 given today, completed Tdap vaccine up to date, due 12/04/2023 Shingles vaccine up to date, completed    Advanced directives: in chart  Conditions/risks identified: none  Next appointment: Sherrie Mustache, NP 12/02/2018 @ 9:45am             Tyson Dense, RN 09/09/2019 @ 8:30am  Preventive Care 65 Years and Older, Male Preventive care refers to lifestyle choices and visits with your health care provider that can promote health and wellness. What does preventive care include?  A yearly physical exam. This is also called an annual well check.  Dental exams once or twice a year.  Routine eye exams. Ask your health care provider how often you should have your eyes checked.  Personal lifestyle choices, including:  Daily care of your teeth and gums.  Regular physical activity.  Eating a healthy diet.  Avoiding tobacco and drug use.  Limiting alcohol use.  Practicing safe sex.  Taking low doses of aspirin every day.  Taking vitamin and mineral supplements as recommended by your health care provider. What happens during an annual well check? The services and screenings done by your health care provider during your annual well check will depend on your age, overall health, lifestyle risk factors, and family history of disease. Counseling  Your health care provider may ask you questions about your:  Alcohol use.  Tobacco use.  Drug use.  Emotional  well-being.  Home and relationship well-being.  Sexual activity.  Eating habits.  History of falls.  Memory and ability to understand (cognition).  Work and work Statistician. Screening  You may have the following tests or measurements:  Height, weight, and BMI.  Blood pressure.  Lipid and cholesterol levels. These may be checked every 5 years, or more frequently if you are over 31 years old.  Skin check.  Lung cancer screening. You may have this screening every year starting at age 73 if you have a 30-pack-year history of smoking and currently smoke or have quit within the past 15 years.  Fecal occult blood test (FOBT) of the stool. You may have this test every year starting at age 49.  Flexible sigmoidoscopy or colonoscopy. You may have a sigmoidoscopy every 5 years or a colonoscopy every 10 years starting at age 20.  Prostate cancer screening. Recommendations will vary depending on your family history and other risks.  Hepatitis C blood test.  Hepatitis B blood test.  Sexually transmitted disease (STD) testing.  Diabetes screening. This is done by checking your blood sugar (glucose) after you have not eaten for a while (fasting). You may have this done every 1-3 years.  Abdominal aortic aneurysm (AAA) screening. You may need this if you are a current or former smoker.  Osteoporosis. You may be screened starting at age 34 if you are at high risk. Talk with your health care provider about your test results, treatment options, and if necessary, the need for more tests. Vaccines  Your health care provider may recommend certain vaccines, such as:  Influenza vaccine. This is  recommended every year.  Tetanus, diphtheria, and acellular pertussis (Tdap, Td) vaccine. You may need a Td booster every 10 years.  Zoster vaccine. You may need this after age 73.  Pneumococcal 13-valent conjugate (PCV13) vaccine. One dose is recommended after age 45.  Pneumococcal  polysaccharide (PPSV23) vaccine. One dose is recommended after age 38. Talk to your health care provider about which screenings and vaccines you need and how often you need them. This information is not intended to replace advice given to you by your health care provider. Make sure you discuss any questions you have with your health care provider. Document Released: 01/13/2016 Document Revised: 09/05/2016 Document Reviewed: 10/18/2015 Elsevier Interactive Patient Education  2017 [employer] Prevention in the Home Falls can cause injuries. They can happen to people of all ages. There are many things you can do to make your home safe and to help prevent falls. What can I do on the outside of my home?  Regularly fix the edges of walkways and driveways and fix any cracks.  Remove anything that might make you trip as you walk through a door, such as a raised step or threshold.  Trim any bushes or trees on the path to your home.  Use bright outdoor lighting.  Clear any walking paths of anything that might make someone trip, such as rocks or tools.  Regularly check to see if handrails are loose or broken. Make sure that both sides of any steps have handrails.  Any raised decks and porches should have guardrails on the edges.  Have any leaves, snow, or ice cleared regularly.  Use sand or salt on walking paths during winter.  Clean up any spills in your garage right away. This includes oil or grease spills. What can I do in the bathroom?  Use night lights.  Install grab bars by the toilet and in the tub and shower. Do not use towel bars as grab bars.  Use non-skid mats or decals in the tub or shower.  If you need to sit down in the shower, use a plastic, non-slip stool.  Keep the floor dry. Clean up any water that spills on the floor as soon as it happens.  Remove soap buildup in the tub or shower regularly.  Attach bath mats securely with double-sided non-slip rug  tape.  Do not have throw rugs and other things on the floor that can make you trip. What can I do in the bedroom?  Use night lights.  Make sure that you have a light by your bed that is easy to reach.  Do not use any sheets or blankets that are too big for your bed. They should not hang down onto the floor.  Have a firm chair that has side arms. You can use this for support while you get dressed.  Do not have throw rugs and other things on the floor that can make you trip. What can I do in the kitchen?  Clean up any spills right away.  Avoid walking on wet floors.  Keep items that you use a lot in easy-to-reach places.  If you need to reach something above you, use a strong step stool that has a grab bar.  Keep electrical cords out of the way.  Do not use floor polish or wax that makes floors slippery. If you must use wax, use non-skid floor wax.  Do not have throw rugs and other things on the floor that can make you trip.  What can I do with my stairs?  Do not leave any items on the stairs.  Make sure that there are handrails on both sides of the stairs and use them. Fix handrails that are broken or loose. Make sure that handrails are as long as the stairways.  Check any carpeting to make sure that it is firmly attached to the stairs. Fix any carpet that is loose or worn.  Avoid having throw rugs at the top or bottom of the stairs. If you do have throw rugs, attach them to the floor with carpet tape.  Make sure that you have a light switch at the top of the stairs and the bottom of the stairs. If you do not have them, ask someone to add them for you. What else can I do to help prevent falls?  Wear shoes that:  Do not have high heels.  Have rubber bottoms.  Are comfortable and fit you well.  Are closed at the toe. Do not wear sandals.  If you use a stepladder:  Make sure that it is fully opened. Do not climb a closed stepladder.  Make sure that both sides of the  stepladder are locked into place.  Ask someone to hold it for you, if possible.  Clearly mark and make sure that you can see:  Any grab bars or handrails.  First and last steps.  Where the edge of each step is.  Use tools that help you move around (mobility aids) if they are needed. These include:  Canes.  Walkers.  Scooters.  Crutches.  Turn on the lights when you go into a dark area. Replace any light bulbs as soon as they burn out.  Set up your furniture so you have a clear path. Avoid moving your furniture around.  If any of your floors are uneven, fix them.  If there are any pets around you, be aware of where they are.  Review your medicines with your doctor. Some medicines can make you feel dizzy. This can increase your chance of falling. Ask your doctor what other things that you can do to help prevent falls. This information is not intended to replace advice given to you by your health care provider. Make sure you discuss any questions you have with your health care provider. Document Released: 10/13/2009 Document Revised: 05/24/2016 Document Reviewed: 01/21/2015 Elsevier Interactive Patient Education  2017 [employer].

## 2018-09-08 ENCOUNTER — Encounter: Payer: Self-pay | Admitting: *Deleted

## 2018-09-13 ENCOUNTER — Other Ambulatory Visit: Payer: Self-pay | Admitting: Family

## 2018-10-03 ENCOUNTER — Other Ambulatory Visit: Payer: Self-pay

## 2018-10-03 MED ORDER — ZOLPIDEM TARTRATE 10 MG PO TABS: ORAL_TABLET | ORAL | 0 refills | Status: DC

## 2018-10-03 NOTE — Telephone Encounter (Signed)
[place] Database verified and compliance confirmed    Patient aware rx called in   S.Chrae B/CMA

## 2018-10-15 ENCOUNTER — Other Ambulatory Visit: Payer: Self-pay | Admitting: Nurse Practitioner

## 2018-10-23 ENCOUNTER — Encounter: Payer: Self-pay | Admitting: Nurse Practitioner

## 2018-10-23 ENCOUNTER — Ambulatory Visit (INDEPENDENT_AMBULATORY_CARE_PROVIDER_SITE_OTHER): Payer: Medicare HMO | Admitting: Nurse Practitioner

## 2018-10-23 VITALS — BP 134/82 | HR 67 | Temp 98.5°F | Ht 70.0 in | Wt 212.0 lb

## 2018-10-23 DIAGNOSIS — M25562 Pain in left knee: Secondary | ICD-10-CM | POA: Diagnosis not present
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
## 2018-10-23 NOTE — Patient Instructions (Signed)
To use aleve (naproxen) 220 mg by mouth twice daily for 1 week Alternate ice and heat ~20 mins three times daily for 1 weeks  To use muscle rub AFTER ice/heat  Physical therapy as been order and we will be in touch with getting this scheduled

## 2018-10-29 ENCOUNTER — Telehealth: Payer: Self-pay | Admitting: *Deleted

## 2018-10-29 DIAGNOSIS — R0683 Snoring: Secondary | ICD-10-CM

## 2018-10-29 DIAGNOSIS — E6609 Other obesity due to excess calories: Secondary | ICD-10-CM

## 2018-10-29 DIAGNOSIS — Z6830 Body mass index (BMI) 30.0-30.9, adult: Principal | ICD-10-CM

## 2018-10-29 NOTE — Telephone Encounter (Signed)
Pulmonary referral has been placed

## 2018-10-29 NOTE — Telephone Encounter (Signed)
Patient called requesting a referral for a sleep study. Stated that his girlfriend told him he needed to be checked due to stop breathing and snoring. Please place referral.  Please Advise.

## 2018-11-04 ENCOUNTER — Other Ambulatory Visit: Payer: Self-pay | Admitting: Nurse Practitioner

## 2018-11-07 ENCOUNTER — Encounter: Payer: Self-pay | Admitting: Pulmonary Disease

## 2018-11-07 ENCOUNTER — Ambulatory Visit: Payer: Medicare HMO | Admitting: Pulmonary Disease

## 2018-11-07 VITALS — BP 142/82 | HR 85 | Ht 70.0 in | Wt 214.0 lb

## 2018-11-07 DIAGNOSIS — G4733 Obstructive sleep apnea (adult) (pediatric): Secondary | ICD-10-CM

## 2018-11-07 NOTE — Progress Notes (Signed)
Rodney Foster    [id number]    [birth year]  Primary Care Physician:Eubanks, Carlos American, NP  Referring Physician: [name], NP [place], [place] [address]  Chief complaint:   History of snoring, history of witnessed apnea  HPI:  States he is not very symptomatic during the day Does not feel too sleepy during the day Does not take daytime naps  He feels well waking up in the morning Goes to bed between 10 or 11 PM, wakes up about 4-5 times during the night Final wake up time about 8 AM No significant weight changes over the years  Quit smoking over 45 years ago He does drink mildly on a daily basis  Occupation: No pertinent history Exposures: Smoking history: Reformed smoker  Outpatient Encounter Medications as of 11/07/2018  Medication Sig  . acyclovir (ZOVIRAX) 400 MG tablet TAKE 1 TO PREVENT HERPES AND TAKE 5 TABLETS PER DAY FOR ACTIVE HERPES  . amLODipine (NORVASC) 5 MG tablet TAKE 1 TABLET BY MOUTH EVERY DAY TO CONTROL BLOOD PRESSURE  . aspirin EC 81 MG tablet Take 81 mg by mouth daily.  Marland Kitchen atorvastatin (LIPITOR) 20 MG tablet Take 1 tablet (20 mg total) by mouth daily.  . cetirizine (ZYRTEC) 10 MG tablet Take 1 tablet (10 mg total) by mouth at bedtime.  . febuxostat (ULORIC) 40 MG tablet Take 1 tablet (40 mg total) by mouth daily.  . fluticasone (FLONASE) 50 MCG/ACT nasal spray Place 2 sprays into both nostrils daily.  Marland Kitchen ibuprofen (ADVIL,MOTRIN) 400 MG tablet Take 1 tablet (400 mg total) by mouth every 6 (six) hours as needed.  Marland Kitchen losartan (COZAAR) 100 MG tablet TAKE ONE TABLET BY MOUTH ONCE DAILY TO CONTROL BLOOD PRESSURE  . tadalafil (CIALIS) 20 MG tablet Take 1 tablet (20 mg total) by mouth daily as needed for erectile dysfunction.  . VENTOLIN HFA 108 (90 Base) MCG/ACT inhaler TAKE 2 PUFFS BY MOUTH EVERY 6 HOURS AS NEEDED FOR WHEEZE OR SHORTNESS OF BREATH  . zolpidem (AMBIEN) 10 MG tablet Take one tablet by mouth at bedtime as needed for  sleep   No facility-administered encounter medications on file as of 11/07/2018.     Allergies as of 11/07/2018  . (No Known Allergies)    Past Medical History:  Diagnosis Date  . Backache, unspecified   . Bronchitis   . Cough variant asthma 05/03/2017  . Genital herpes   . Gout   . Hearing loss   . Hemorrhoids   . Hernia   . Herpes simplex   . Hyperglycemia   . Hyperlipidemia   . Hypertension   . Impotence   . Insomnia, unspecified   . Osteoarthrosis, unspecified whether generalized or localized, unspecified site   . Tinea pedis, right   . Unspecified disorder of male genital organs     Past Surgical History:  Procedure Laterality Date  . fracture left femur  1984  . ganglion repair of left forarm  2000  . IVP  67   ML old comp fracture  . NASAL SEPTUM SURGERY  1997  . TONSILLECTOMY  1959  . UMBILICAL HERNIA REPAIR  2002    Family History  Problem Relation Age of Onset  . COPD Mother   . Aortic aneurysm Father     Social History   Socioeconomic History  . Marital status: Divorced    Spouse name: Not on file  . Number of children: Not on file  .  Years of education: Not on file  . Highest education level: Not on file  Occupational History  . Not on file  Social Needs  . Financial resource strain: Not hard at all  . Food insecurity:    Worry: Never true    Inability: Never true  . Transportation needs:    Medical: No    Non-medical: No  Tobacco Use  . Smoking status: Former Smoker    Packs/day: 1.00    Years: 15.00    Pack years: 15.00    Last attempt to quit: 01/01/1984    Years since quitting: 34.8  . Smokeless tobacco: Never Used  Substance and Sexual Activity  . Alcohol use: Yes    Alcohol/week: 6.0 - 7.0 standard drinks    Types: 6 - 7 Glasses of wine per week    Comment: 3-4 a night of wine or coctail  . Drug use: No  . Sexual activity: Yes  Lifestyle  . Physical activity:    Days per week: 4 days    Minutes per session: 30 min  .  Stress: Only a little  Relationships  . Social connections:    Talks on phone: More than three times a week    Gets together: More than three times a week    Attends religious service: Never    Active member of club or organization: No    Attends meetings of clubs or organizations: Never    Relationship status: Divorced  . Intimate partner violence:    Fear of current or ex partner: No    Emotionally abused: No    Physically abused: No    Forced sexual activity: No  Other Topics Concern  . Not on file  Social History Narrative  . Not on file    Review of Systems  Constitutional: Negative.   Respiratory: Positive for apnea.   Cardiovascular: Negative.   Gastrointestinal: Negative.   Musculoskeletal: Negative.   Psychiatric/Behavioral: Positive for sleep disturbance.    Vitals:   11/07/18 1047  BP: (!) 142/82  Pulse: 85  SpO2: 94%     Physical Exam  Constitutional: He is oriented to person, place, and time. He appears well-developed and well-nourished.  HENT:  Head: Normocephalic and atraumatic.  Eyes: Pupils are equal, round, and reactive to light. Conjunctivae and EOM are normal. Right eye exhibits no discharge. Left eye exhibits no discharge.  Neck: Normal range of motion. Neck supple. No tracheal deviation present. No thyromegaly present.  Cardiovascular: Normal rate and regular rhythm.  Pulmonary/Chest: Effort normal and breath sounds normal. No respiratory distress.  Abdominal: Soft. Bowel sounds are normal. He exhibits no distension.  Musculoskeletal: Normal range of motion. He exhibits no edema.  Neurological: He is alert and oriented to person, place, and time. No cranial nerve deficit.   Assessment:   Moderate probability of significant sleep disordered breathing  His desire will be an oral device if he is positive for sleep disordered breathing  Plan/Recommendations:  We will set the patient up with a home sleep study  Pathophysiology of sleep  disordered breathing discussed Treatment options discussed with patient  I will see him back in the office in about 3 months, call with any significant concerns   Sherrilyn Rist MD [facility] 11/07/2018, 10:48 AM  CC: [name], NP

## 2018-11-07 NOTE — Patient Instructions (Signed)
Moderate probability of significant sleep disordered breathing  We will set you up with a home sleep study  I will see you back in the office about 3 months An oral device fashion by a dentist will be an option for treatment as we discussed  We will give you a call as soon as we get results back from your home sleep test  Sleep Apnea Sleep apnea is a condition that affects breathing. People with sleep apnea have moments during sleep when their breathing pauses briefly or gets shallow. Sleep apnea can cause these symptoms:  Trouble staying asleep.  Sleepiness or tiredness during the day.  Irritability.  Loud snoring.  Morning headaches.  Trouble concentrating.  Forgetting things.  Less interest in sex.  Being sleepy for no reason.  Mood swings.  Personality changes.  Depression.  Waking up a lot during the night to pee (urinate).  Dry mouth.  Sore throat.  Follow these instructions at home:  Make any changes in your routine that your doctor recommends.  Eat a healthy, well-balanced diet.  Take over-the-counter and prescription medicines only as told by your doctor.  Avoid using alcohol, calming medicines (sedatives), and narcotic medicines.  Take steps to lose weight if you are overweight.  If you were given a machine (device) to use while you sleep, use it only as told by your doctor.  Do not use any tobacco products, such as cigarettes, chewing tobacco, and e-cigarettes. If you need help quitting, ask your doctor.  Keep all follow-up visits as told by your doctor. This is important. Contact a doctor if:  The machine that you were given to use during sleep is uncomfortable or does not seem to be working.  Your symptoms do not get better.  Your symptoms get worse. Get help right away if:  Your chest hurts.  You have trouble breathing in enough air (shortness of breath).  You have an uncomfortable feeling in your back, arms, or stomach.  You  have trouble talking.  One side of your body feels weak.  A part of your face is hanging down (drooping). These symptoms may be an emergency. Do not wait to see if the symptoms will go away. Get medical help right away. Call your local emergency services (911 in the [place] Do not drive yourself to the hospital. This information is not intended to replace advice given to you by your health care provider. Make sure you discuss any questions you have with your health care provider. Document Released: 09/25/2008 Document Revised: 08/12/2016 Document Reviewed: 09/26/2015 Elsevier Interactive Patient Education  [employer].

## 2018-11-14 ENCOUNTER — Other Ambulatory Visit: Payer: Self-pay | Admitting: Nurse Practitioner

## 2018-11-14 DIAGNOSIS — E782 Mixed hyperlipidemia: Secondary | ICD-10-CM

## 2018-11-17 ENCOUNTER — Telehealth: Payer: Self-pay

## 2018-11-17 MED ORDER — FEBUXOSTAT 40 MG PO TABS: 40.0000 mg | ORAL_TABLET | Freq: Every day | ORAL | 1 refills | Status: DC

## 2018-11-17 NOTE — Telephone Encounter (Signed)
Patient called requesting refill on Uloric. Patient gets rx filled at [facility] [phone] Fax [phone]  Rx printed and manually faxed as requested

## 2018-11-19 MED ORDER — FEBUXOSTAT 40 MG PO TABS: 40.0000 mg | ORAL_TABLET | Freq: Every day | ORAL | 1 refills | Status: DC

## 2018-11-19 NOTE — Telephone Encounter (Signed)
Patient called and stated that [facility] did not receive Rx. Refaxed.

## 2018-11-19 NOTE — Addendum Note (Signed)
Addended by: Rafael Bihari A on: 11/19/2018 09:43 AM   Modules accepted: Orders

## 2018-11-23 DIAGNOSIS — G4733 Obstructive sleep apnea (adult) (pediatric): Secondary | ICD-10-CM | POA: Diagnosis not present

## 2018-11-25 ENCOUNTER — Other Ambulatory Visit: Payer: Self-pay | Admitting: *Deleted

## 2018-11-25 ENCOUNTER — Other Ambulatory Visit: Payer: Self-pay | Admitting: Nurse Practitioner

## 2018-11-25 DIAGNOSIS — G4733 Obstructive sleep apnea (adult) (pediatric): Secondary | ICD-10-CM

## 2018-11-26 ENCOUNTER — Other Ambulatory Visit: Payer: Self-pay | Admitting: *Deleted

## 2018-11-26 DIAGNOSIS — N529 Male erectile dysfunction, unspecified: Secondary | ICD-10-CM

## 2018-11-26 MED ORDER — TADALAFIL 20 MG PO TABS: 20.0000 mg | ORAL_TABLET | Freq: Every day | ORAL | 0 refills | Status: DC | PRN

## 2018-11-26 NOTE — Telephone Encounter (Signed)
Patient called requesting refill. [place] and needs a refill called to [facility] [phone]. Faxed

## 2018-12-01 ENCOUNTER — Other Ambulatory Visit: Payer: Self-pay | Admitting: *Deleted

## 2018-12-01 MED ORDER — ZOLPIDEM TARTRATE 10 MG PO TABS: ORAL_TABLET | ORAL | 0 refills | Status: DC

## 2018-12-01 NOTE — Telephone Encounter (Signed)
Patient requested refill. Phoned to pharmacy.  

## 2018-12-02 ENCOUNTER — Ambulatory Visit: Payer: Medicare HMO | Admitting: Nurse Practitioner

## 2018-12-04 DIAGNOSIS — G4733 Obstructive sleep apnea (adult) (pediatric): Secondary | ICD-10-CM | POA: Diagnosis not present

## 2018-12-07 ENCOUNTER — Other Ambulatory Visit: Payer: Self-pay | Admitting: Nurse Practitioner

## 2018-12-08 NOTE — Telephone Encounter (Signed)
Copied and pasted from [place] on 06/02/18:  To make lab appt for next week. Make sure you are fasting  6 month for routine follow up    Patient seen several times since 06/02/18 for acute visits. 6 month follow was scheduled for 12/02/18 (no showed). Is it ok to provide refills and does patient need routine follow-up since he was seen several times for acute visits.   Please advise

## 2018-12-09 ENCOUNTER — Telehealth: Payer: Self-pay | Admitting: *Deleted

## 2018-12-09 DIAGNOSIS — M25562 Pain in left knee: Secondary | ICD-10-CM | POA: Diagnosis not present

## 2018-12-09 DIAGNOSIS — M25561 Pain in right knee: Secondary | ICD-10-CM | POA: Diagnosis not present

## 2018-12-09 NOTE — Telephone Encounter (Signed)
Pt is overdue for an office visit, please have him schedule a routine follow up and we can also discuss this during that time.

## 2018-12-09 NOTE — Telephone Encounter (Signed)
Patient called and stated that he is having bilateral knee pain unrelieved by Aleve. Patient stated that a friend told him about Diclofenac Sodium and he wants to try it.  Please Advise.

## 2018-12-09 NOTE — Telephone Encounter (Signed)
Patient notified and scheduled an appointment for Monday. Nothing available sooner.

## 2018-12-10 ENCOUNTER — Telehealth: Payer: Self-pay | Admitting: Pulmonary Disease

## 2018-12-10 DIAGNOSIS — G4733 Obstructive sleep apnea (adult) (pediatric): Secondary | ICD-10-CM

## 2018-12-10 NOTE — Telephone Encounter (Signed)
Dr. Ander Slade has reviewed the home sleep test this showed Severe obstructive sleep apnea. With Severe oxygen desaturations.  Recommendations   Treatment options are CPAP with the settings auto 5 to 15.    Weight loss measures .   Advise against driving while sleepy & against medication with sedative side effects.    Make appointment for 3 months for compliance with download with Dr. Ander Slade.   Called and spoke with the patient he verbalized Annia Friendly has been placed and apt made nothing further needed at this time.

## 2018-12-15 ENCOUNTER — Ambulatory Visit: Payer: Medicare HMO | Admitting: Nurse Practitioner

## 2018-12-30 ENCOUNTER — Other Ambulatory Visit: Payer: Self-pay | Admitting: Nurse Practitioner

## 2019-01-05 DIAGNOSIS — M545 Low back pain: Secondary | ICD-10-CM | POA: Diagnosis not present

## 2019-01-05 DIAGNOSIS — M25551 Pain in right hip: Secondary | ICD-10-CM | POA: Diagnosis not present

## 2019-01-06 ENCOUNTER — Ambulatory Visit: Payer: Medicare HMO | Admitting: Nurse Practitioner

## 2019-01-08 DIAGNOSIS — M25561 Pain in right knee: Secondary | ICD-10-CM | POA: Diagnosis not present

## 2019-01-12 DIAGNOSIS — M25561 Pain in right knee: Secondary | ICD-10-CM | POA: Diagnosis not present

## 2019-01-19 ENCOUNTER — Other Ambulatory Visit: Payer: Self-pay | Admitting: *Deleted

## 2019-01-19 ENCOUNTER — Ambulatory Visit (INDEPENDENT_AMBULATORY_CARE_PROVIDER_SITE_OTHER): Payer: Medicare HMO | Admitting: Nurse Practitioner

## 2019-01-19 ENCOUNTER — Encounter: Payer: Self-pay | Admitting: Nurse Practitioner

## 2019-01-19 VITALS — BP 130/78 | HR 90 | Temp 97.5°F | Ht 70.0 in | Wt 222.4 lb

## 2019-01-19 DIAGNOSIS — R739 Hyperglycemia, unspecified: Secondary | ICD-10-CM | POA: Diagnosis not present

## 2019-01-19 DIAGNOSIS — N401 Enlarged prostate with lower urinary tract symptoms: Secondary | ICD-10-CM

## 2019-01-19 DIAGNOSIS — J45991 Cough variant asthma: Secondary | ICD-10-CM | POA: Diagnosis not present

## 2019-01-19 DIAGNOSIS — I10 Essential (primary) hypertension: Secondary | ICD-10-CM

## 2019-01-19 DIAGNOSIS — G4700 Insomnia, unspecified: Secondary | ICD-10-CM | POA: Diagnosis not present

## 2019-01-19 DIAGNOSIS — R0981 Nasal congestion: Secondary | ICD-10-CM | POA: Diagnosis not present

## 2019-01-19 DIAGNOSIS — B009 Herpesviral infection, unspecified: Secondary | ICD-10-CM

## 2019-01-19 DIAGNOSIS — E782 Mixed hyperlipidemia: Secondary | ICD-10-CM

## 2019-01-19 DIAGNOSIS — R3912 Poor urinary stream: Secondary | ICD-10-CM

## 2019-01-19 MED ORDER — TAMSULOSIN HCL 0.4 MG PO CAPS: 0.4000 mg | ORAL_CAPSULE | Freq: Every day | ORAL | 3 refills | Status: DC

## 2019-01-19 MED ORDER — ZOLPIDEM TARTRATE 5 MG PO TABS: ORAL_TABLET | ORAL | 1 refills | Status: DC

## 2019-01-19 MED ORDER — ALBUTEROL SULFATE HFA 108 (90 BASE) MCG/ACT IN AERS: INHALATION_SPRAY | RESPIRATORY_TRACT | 1 refills | Status: DC

## 2019-01-19 NOTE — Patient Instructions (Signed)
To make LAB appt in the morning   Use mucinex or mucinex DM (DM is for cough) by mouth twice daily with full glass of water.   Use albuterol as needed for shortness of breath or wheezing.

## 2019-01-19 NOTE — Progress Notes (Signed)
Careteam: Patient Care Team: [name], NP as PCP - General (Geriatric Medicine)  Advanced Directive information Does Patient Have a Medical Advance Directive?: Yes, Type of Advance Directive: Living will, Does patient want to make changes to medical advance directive?: No - Patient declined  No Known Allergies  Chief Complaint  Patient presents with  . Medical Management of Chronic Issues    routine office visit     HPI: Patient is a 69 y.o. male seen in the office today for routine follow up.  Pt has been following with orthopedic for knee pain. Having severe OA he has bone on bone and having a procedure on right knee, and then left knee is next.   HTN- controlled on norvasc 5 mg and losartan 100 mg daily   Herpes simplex- acyclovir 400 mg tablet for suppressive therapy. Last flare was 6 months ago.   Insomnia- taking ambien 5 mg by mouth at bedtme.   Hyperlipidemia- lipitor 20 mg by mouth daily   Chronic nasal congestion- no recent flares. Not taking zyrtec or Flonase.   Obesity- knees limit mobility and not able to exercise. Needs to change diet but girlfriend keeps feeding him.   Asthma- "had bronchitis my whole life" using albuterol 1 puff daily   Found to have OSA- planning to get CPAP  Reports urinary stream is weak, hard time emptying. Having issues several years. PSA 2.2 checked 7 months ago. No pain with urination.   Review of Systems:  Review of Systems  Constitutional: Negative for chills, fever and malaise/fatigue.  Respiratory: Negative for cough, shortness of breath and wheezing.        Chronic congestion   Cardiovascular: Negative for chest pain, palpitations and leg swelling.  Gastrointestinal: Negative for constipation, diarrhea, heartburn, nausea and vomiting.  Genitourinary: Positive for frequency (weak stream). Negative for dysuria and urgency.  Musculoskeletal: Positive for joint pain (knee pain bilaterally). Negative for myalgias.    Skin: Negative for itching and rash.  Neurological: Negative for dizziness and headaches.  Psychiatric/Behavioral: Negative for depression. The patient has insomnia (controlled on ambein ).     Past Medical History:  Diagnosis Date  . Backache, unspecified   . Bronchitis   . Cough variant asthma 05/03/2017  . Genital herpes   . Gout   . Hearing loss   . Hemorrhoids   . Hernia   . Herpes simplex   . Hyperglycemia   . Hyperlipidemia   . Hypertension   . Impotence   . Insomnia, unspecified   . Osteoarthrosis, unspecified whether generalized or localized, unspecified site   . Tinea pedis, right   . Unspecified disorder of male genital organs    Past Surgical History:  Procedure Laterality Date  . fracture left femur  1984  . ganglion repair of left forarm  2000  . IVP  11   ML old comp fracture  . NASAL SEPTUM SURGERY  1997  . TONSILLECTOMY  1959  . UMBILICAL HERNIA REPAIR  2002   Social History:   reports that he quit smoking about 35 years ago. He has a 15.00 pack-year smoking history. He has never used smokeless tobacco. He reports current alcohol use of about 6.0 - 7.0 standard drinks of alcohol per week. He reports that he does not use drugs.  Family History  Problem Relation Age of Onset  . COPD Mother   . Aortic aneurysm Father     Medications: Patient's Medications  New Prescriptions   No  medications on file  Previous Medications   ACYCLOVIR (ZOVIRAX) 400 MG TABLET    TAKE 1 TO PREVENT HERPES AND TAKE 5 TABLETS PER DAY FOR ACTIVE HERPES   AMLODIPINE (NORVASC) 5 MG TABLET    TAKE 1 TABLET BY MOUTH EVERY DAY TO CONTROL BLOOD PRESSURE** NEED APPT FOR FURTHER REFILL**   ASPIRIN EC 81 MG TABLET    Take 81 mg by mouth daily.   ATORVASTATIN (LIPITOR) 20 MG TABLET    TAKE 1 TABLET BY MOUTH EVERY DAY   CETIRIZINE (ZYRTEC) 10 MG TABLET    Take 1 tablet (10 mg total) by mouth at bedtime.   FEBUXOSTAT (ULORIC) 40 MG TABLET    Take 1 tablet (40 mg total) by mouth daily.    FLUTICASONE (FLONASE) 50 MCG/ACT NASAL SPRAY    Place 2 sprays into both nostrils daily.   IBUPROFEN (ADVIL,MOTRIN) 400 MG TABLET    Take 1 tablet (400 mg total) by mouth every 6 (six) hours as needed.   LOSARTAN (COZAAR) 100 MG TABLET    TAKE ONE TABLET BY MOUTH ONCE DAILY TO CONTROL BLOOD PRESSURE   TADALAFIL (ADCIRCA/CIALIS) 20 MG TABLET    Take 1 tablet (20 mg total) by mouth daily as needed for erectile dysfunction.   VENTOLIN HFA 108 (90 BASE) MCG/ACT INHALER    TAKE 2 PUFFS BY MOUTH EVERY 6 HOURS AS NEEDED FOR WHEEZE OR SHORTNESS OF BREATH   ZOLPIDEM (AMBIEN) 10 MG TABLET    Take one tablet by mouth at bedtime as needed for sleep  Modified Medications   No medications on file  Discontinued Medications   No medications on file     Physical Exam:  Vitals:   01/19/19 1525  BP: 130/78  Pulse: 90  Temp: (!) 97.5 F (36.4 C)  TempSrc: Oral  SpO2: 96%  Weight: 222 lb 6.4 oz (100.9 kg)  Height: 5\' 10"  (1.778 m)   Body mass index is 31.91 kg/m.  Physical Exam Constitutional:      Appearance: Normal appearance.  HENT:     Head: Normocephalic and atraumatic.     Nose: Nose normal.     Mouth/Throat:     Mouth: Mucous membranes are moist.  Cardiovascular:     Rate and Rhythm: Normal rate and regular rhythm.  Pulmonary:     Effort: Pulmonary effort is normal.     Comments: diminished throughout Abdominal:     General: Bowel sounds are normal.     Palpations: Abdomen is soft.  Musculoskeletal:     Right lower leg: No edema.     Left lower leg: No edema.  Skin:    General: Skin is warm and dry.  Neurological:     Mental Status: He is alert.     Labs reviewed: Basic Metabolic Panel: Recent Labs    06/04/18 0852 09/05/18 0819  NA 140 138  K 4.7 4.6  CL 104 102  CO2 25 27  GLUCOSE 86 102*  BUN 16 13  CREATININE 0.78 0.88  CALCIUM 9.3 9.5   Liver Function Tests: Recent Labs    06/04/18 0852 09/05/18 0819  AST 31 29  ALT 40 31  BILITOT 0.3 0.6  PROT  6.8 7.0   No results for input(s): LIPASE, AMYLASE in the last 8760 hours. No results for input(s): AMMONIA in the last 8760 hours. CBC: Recent Labs    06/04/18 0852  WBC 6.3  NEUTROABS 2,703  HGB 16.1  HCT 46.2  MCV 93.5  PLT  299   Lipid Panel: Recent Labs    06/04/18 0852 09/05/18 0819  CHOL 268* 206*  HDL 52 63  LDLCALC 178* 118*  TRIG 213* 133  CHOLHDL 5.2* 3.3   TSH: No results for input(s): TSH in the last 8760 hours. A1C: Lab Results  Component Value Date   HGBA1C 5.5 09/12/2015     Assessment/Plan 1. Essential hypertension -controlled on losartan and norvasc.  - CBC with Differential/Platelets; Future  2. Sinus congestion Stable at this time. Without recent worsening of symptoms. Not taking zyrtec or flonase at this time.   3. Hyperglycemia - Hemoglobin A1c; Future  4. Herpes simplex Uses acyclovir daily for prevention of oral herpes.   5. Insomnia, unspecified type -controlled on ambien.  - zolpidem (AMBIEN) 5 MG tablet; Take one tablet by mouth at bedtime as needed for sleep  Dispense: 30 tablet; Refill: 1  6. Cough variant asthma -to use albuterol as needed wheezing, shortness of breath. -to use mucinex by mouth twice daily for congestion.  - albuterol (VENTOLIN HFA) 108 (90 Base) MCG/ACT inhaler; TAKE 2 PUFFS BY MOUTH EVERY 6 HOURS AS NEEDED FOR WHEEZE OR SHORTNESS OF BREATH  Dispense: 18 Inhaler; Refill: 1  7. Mixed hyperlipidemia -continues on lipitor, will come back in am for fasting blood work - COMPLETE METABOLIC PANEL WITH GFR; Future - Lipid panel; Future  8. Benign prostatic hyperplasia with weak urinary stream -PSA in normal range on last labs, will start flomax to help with urinary symptoms.  - tamsulosin (FLOMAX) 0.4 MG CAPS capsule; Take 1 capsule (0.4 mg total) by mouth daily.  Dispense: 30 capsule; Refill: 3  Next appt: 3 months, sooner if needed Jessica K. [place], [facility] [phone]

## 2019-01-20 ENCOUNTER — Other Ambulatory Visit: Payer: Medicare HMO

## 2019-01-20 DIAGNOSIS — E782 Mixed hyperlipidemia: Secondary | ICD-10-CM | POA: Diagnosis not present

## 2019-01-20 DIAGNOSIS — R739 Hyperglycemia, unspecified: Secondary | ICD-10-CM | POA: Diagnosis not present

## 2019-01-20 DIAGNOSIS — I10 Essential (primary) hypertension: Secondary | ICD-10-CM | POA: Diagnosis not present

## 2019-01-21 DIAGNOSIS — S83241A Other tear of medial meniscus, current injury, right knee, initial encounter: Secondary | ICD-10-CM | POA: Diagnosis not present

## 2019-01-21 DIAGNOSIS — G8918 Other acute postprocedural pain: Secondary | ICD-10-CM | POA: Diagnosis not present

## 2019-01-21 DIAGNOSIS — S83281A Other tear of lateral meniscus, current injury, right knee, initial encounter: Secondary | ICD-10-CM | POA: Diagnosis not present

## 2019-01-21 DIAGNOSIS — S83231A Complex tear of medial meniscus, current injury, right knee, initial encounter: Secondary | ICD-10-CM | POA: Diagnosis not present

## 2019-01-21 DIAGNOSIS — M958 Other specified acquired deformities of musculoskeletal system: Secondary | ICD-10-CM | POA: Diagnosis not present

## 2019-01-21 DIAGNOSIS — M1711 Unilateral primary osteoarthritis, right knee: Secondary | ICD-10-CM | POA: Diagnosis not present

## 2019-01-21 HISTORY — PX: KNEE SURGERY: SHX244

## 2019-01-21 LAB — COMPLETE METABOLIC PANEL WITH GFR
AG Ratio: 1.8 (calc) (ref 1.0–2.5)
ALT: 46 U/L (ref 9–46)
AST: 22 U/L (ref 10–35)
Albumin: 4.3 g/dL (ref 3.6–5.1)
Alkaline phosphatase (APISO): 83 U/L (ref 40–115)
BUN: 17 mg/dL (ref 7–25)
CO2: 28 mmol/L (ref 20–32)
Calcium: 9.5 mg/dL (ref 8.6–10.3)
Chloride: 102 mmol/L (ref 98–110)
Creat: 0.87 mg/dL (ref 0.70–1.18)
GFR, Est African American: 101 mL/min/{1.73_m2} (ref >=60)
GFR, Est Non African American: 87 mL/min/{1.73_m2} (ref >=60)
GLUCOSE: 103 mg/dL — AB (ref 65–99)
Globulin: 2.4 g/dL (calc) (ref 1.9–3.7)
Potassium: 4.7 mmol/L (ref 3.5–5.3)
Sodium: 138 mmol/L (ref 135–146)
Total Bilirubin: 0.4 mg/dL (ref 0.2–1.2)
Total Protein: 6.7 g/dL (ref 6.1–8.1)

## 2019-01-21 LAB — CBC WITH DIFFERENTIAL/PLATELET
Absolute Monocytes: 988 cells/uL — ABNORMAL HIGH (ref 200–950)
Basophils Absolute: 89 cells/uL (ref 0–200)
Basophils Relative: 1 %
Eosinophils Absolute: 98 cells/uL (ref 15–500)
Eosinophils Relative: 1.1 %
HCT: 43.2 % (ref 38.5–50.0)
Hemoglobin: 15.5 g/dL (ref 13.2–17.1)
Lymphs Abs: 3889 cells/uL (ref 850–3900)
MCH: 33.4 pg — ABNORMAL HIGH (ref 27.0–33.0)
MCHC: 35.9 g/dL (ref 32.0–36.0)
MCV: 93.1 fL (ref 80.0–100.0)
MONOS PCT: 11.1 %
MPV: 10.4 fL (ref 7.5–12.5)
Neutro Abs: 3836 cells/uL (ref 1500–7800)
Neutrophils Relative %: 43.1 %
Platelets: 272 10*3/uL (ref 140–400)
RBC: 4.64 10*6/uL (ref 4.20–5.80)
RDW: 12 % (ref 11.0–15.0)
Total Lymphocyte: 43.7 %
WBC: 8.9 10*3/uL (ref 3.8–10.8)

## 2019-01-21 LAB — LIPID PANEL
Cholesterol: 187 mg/dL (ref <200)
HDL: 51 mg/dL (ref >40)
LDL Cholesterol (Calc): 111 mg/dL (calc) — ABNORMAL HIGH
Non-HDL Cholesterol (Calc): 136 mg/dL (calc) — ABNORMAL HIGH (ref <130)
Total CHOL/HDL Ratio: 3.7 (calc) (ref <5.0)
Triglycerides: 139 mg/dL (ref <150)

## 2019-01-21 LAB — HEMOGLOBIN A1C
Hgb A1c MFr Bld: 5.5 % of total Hgb (ref <5.7)
Mean Plasma Glucose: 111 (calc)
eAG (mmol/L): 6.2 (calc)

## 2019-01-24 ENCOUNTER — Other Ambulatory Visit: Payer: Self-pay | Admitting: Nurse Practitioner

## 2019-01-26 DIAGNOSIS — M6281 Muscle weakness (generalized): Secondary | ICD-10-CM | POA: Diagnosis not present

## 2019-01-26 DIAGNOSIS — M1711 Unilateral primary osteoarthritis, right knee: Secondary | ICD-10-CM | POA: Diagnosis not present

## 2019-01-26 DIAGNOSIS — S83231D Complex tear of medial meniscus, current injury, right knee, subsequent encounter: Secondary | ICD-10-CM | POA: Diagnosis not present

## 2019-01-28 DIAGNOSIS — S83231D Complex tear of medial meniscus, current injury, right knee, subsequent encounter: Secondary | ICD-10-CM | POA: Diagnosis not present

## 2019-01-28 DIAGNOSIS — M1711 Unilateral primary osteoarthritis, right knee: Secondary | ICD-10-CM | POA: Diagnosis not present

## 2019-01-28 DIAGNOSIS — M6281 Muscle weakness (generalized): Secondary | ICD-10-CM | POA: Diagnosis not present

## 2019-01-29 DIAGNOSIS — M1711 Unilateral primary osteoarthritis, right knee: Secondary | ICD-10-CM | POA: Diagnosis not present

## 2019-01-30 DIAGNOSIS — M1711 Unilateral primary osteoarthritis, right knee: Secondary | ICD-10-CM | POA: Diagnosis not present

## 2019-01-30 DIAGNOSIS — S83231D Complex tear of medial meniscus, current injury, right knee, subsequent encounter: Secondary | ICD-10-CM | POA: Diagnosis not present

## 2019-01-30 DIAGNOSIS — M6281 Muscle weakness (generalized): Secondary | ICD-10-CM | POA: Diagnosis not present

## 2019-02-04 DIAGNOSIS — M1711 Unilateral primary osteoarthritis, right knee: Secondary | ICD-10-CM | POA: Diagnosis not present

## 2019-02-04 DIAGNOSIS — M6281 Muscle weakness (generalized): Secondary | ICD-10-CM | POA: Diagnosis not present

## 2019-02-04 DIAGNOSIS — S83231D Complex tear of medial meniscus, current injury, right knee, subsequent encounter: Secondary | ICD-10-CM | POA: Diagnosis not present

## 2019-02-06 DIAGNOSIS — S83231D Complex tear of medial meniscus, current injury, right knee, subsequent encounter: Secondary | ICD-10-CM | POA: Diagnosis not present

## 2019-02-06 DIAGNOSIS — M6281 Muscle weakness (generalized): Secondary | ICD-10-CM | POA: Diagnosis not present

## 2019-02-06 DIAGNOSIS — M1711 Unilateral primary osteoarthritis, right knee: Secondary | ICD-10-CM | POA: Diagnosis not present

## 2019-02-10 DIAGNOSIS — S83231D Complex tear of medial meniscus, current injury, right knee, subsequent encounter: Secondary | ICD-10-CM | POA: Diagnosis not present

## 2019-02-10 DIAGNOSIS — M6281 Muscle weakness (generalized): Secondary | ICD-10-CM | POA: Diagnosis not present

## 2019-02-10 DIAGNOSIS — M1711 Unilateral primary osteoarthritis, right knee: Secondary | ICD-10-CM | POA: Diagnosis not present

## 2019-02-11 ENCOUNTER — Other Ambulatory Visit: Payer: Self-pay | Admitting: Nurse Practitioner

## 2019-02-12 DIAGNOSIS — M6281 Muscle weakness (generalized): Secondary | ICD-10-CM | POA: Diagnosis not present

## 2019-02-12 DIAGNOSIS — S83231D Complex tear of medial meniscus, current injury, right knee, subsequent encounter: Secondary | ICD-10-CM | POA: Diagnosis not present

## 2019-02-12 DIAGNOSIS — M1711 Unilateral primary osteoarthritis, right knee: Secondary | ICD-10-CM | POA: Diagnosis not present

## 2019-02-20 ENCOUNTER — Other Ambulatory Visit: Payer: Self-pay | Admitting: *Deleted

## 2019-02-20 DIAGNOSIS — G4700 Insomnia, unspecified: Secondary | ICD-10-CM

## 2019-02-20 MED ORDER — ZOLPIDEM TARTRATE 5 MG PO TABS: ORAL_TABLET | ORAL | 1 refills | Status: DC

## 2019-02-20 NOTE — Telephone Encounter (Signed)
Patient requested refill

## 2019-03-10 ENCOUNTER — Telehealth: Payer: Self-pay | Admitting: Pulmonary Disease

## 2019-03-10 DIAGNOSIS — S83241D Other tear of medial meniscus, current injury, right knee, subsequent encounter: Secondary | ICD-10-CM | POA: Diagnosis not present

## 2019-03-10 DIAGNOSIS — G4733 Obstructive sleep apnea (adult) (pediatric): Secondary | ICD-10-CM

## 2019-03-10 NOTE — Telephone Encounter (Signed)
Called and spoke with patient he stated that [facility] called him in December to set up his CPAP machine and to do a 2 hour class on how to use it clean it, etc. Patient stated that he did not go through with it at that time because he was too busy. Patient now wants to get the CPAP and take the class.   Ao please advise if we need to send in a new order. Called [facility] and was placed on hold for over 10 minutes.

## 2019-03-10 NOTE — Telephone Encounter (Signed)
Called and spoke with patient he is aware and verbalized understanding. Order placed. Nothing further needed.

## 2019-03-10 NOTE — Telephone Encounter (Signed)
Yes I will see that we need to go ahead and put in another order for the CPAP

## 2019-03-17 ENCOUNTER — Telehealth: Payer: Self-pay

## 2019-03-17 NOTE — Telephone Encounter (Signed)
[facility] can you help with this, the order was sent in and confirmed on 03/10/19 thank you.

## 2019-03-18 NOTE — Telephone Encounter (Signed)
Type Date User Summary Attachment  General 03/17/2019 2:16 PM Harland German - -  Note   Stenson, Lillia Mountain, Colvin Caroli, Jeanie Cooks, Stephani Police        Hey there!   Looks like our RT team has been trying to contact him. He needs to call them to schedule.   [phone] [id number].   Thank you!  Melissa

## 2019-03-18 NOTE — Telephone Encounter (Signed)
Spoke with the pt and notified him that [facility] has been trying to reach him  He states that since the time of his call to our office, he has spoken with [facility] and has appt to get set up with CPAP  Nothing further needed

## 2019-03-19 DIAGNOSIS — G4730 Sleep apnea, unspecified: Secondary | ICD-10-CM | POA: Diagnosis not present

## 2019-03-19 DIAGNOSIS — G4733 Obstructive sleep apnea (adult) (pediatric): Secondary | ICD-10-CM | POA: Diagnosis not present

## 2019-03-24 ENCOUNTER — Other Ambulatory Visit: Payer: Self-pay | Admitting: *Deleted

## 2019-03-24 DIAGNOSIS — G4700 Insomnia, unspecified: Secondary | ICD-10-CM

## 2019-03-24 MED ORDER — ZOLPIDEM TARTRATE 5 MG PO TABS: ORAL_TABLET | ORAL | 1 refills | Status: DC

## 2019-03-24 NOTE — Telephone Encounter (Signed)
Patient requested refill on Zolpidem [facility] Database Verified LR: 02/20/19 Phoned to pharmacy.

## 2019-03-31 ENCOUNTER — Ambulatory Visit: Payer: Medicare HMO | Admitting: Pulmonary Disease

## 2019-04-03 ENCOUNTER — Other Ambulatory Visit: Payer: Self-pay | Admitting: *Deleted

## 2019-04-03 MED ORDER — FEBUXOSTAT 40 MG PO TABS: 40.0000 mg | ORAL_TABLET | Freq: Every day | ORAL | 0 refills | Status: DC

## 2019-04-03 NOTE — Telephone Encounter (Signed)
Patient called and stated that he is unable to get his Rx from [facility] for another 2-3 weeks. Wants a 2 week supply of Uloric sent to his local pharmacy.

## 2019-04-15 ENCOUNTER — Other Ambulatory Visit: Payer: Self-pay

## 2019-04-15 ENCOUNTER — Ambulatory Visit (INDEPENDENT_AMBULATORY_CARE_PROVIDER_SITE_OTHER): Payer: Medicare HMO | Admitting: Nurse Practitioner

## 2019-04-15 ENCOUNTER — Encounter: Payer: Self-pay | Admitting: Nurse Practitioner

## 2019-04-15 DIAGNOSIS — N401 Enlarged prostate with lower urinary tract symptoms: Secondary | ICD-10-CM | POA: Diagnosis not present

## 2019-04-15 DIAGNOSIS — E782 Mixed hyperlipidemia: Secondary | ICD-10-CM | POA: Diagnosis not present

## 2019-04-15 DIAGNOSIS — M1711 Unilateral primary osteoarthritis, right knee: Secondary | ICD-10-CM | POA: Diagnosis not present

## 2019-04-15 DIAGNOSIS — M1A9XX0 Chronic gout, unspecified, without tophus (tophi): Secondary | ICD-10-CM

## 2019-04-15 DIAGNOSIS — G4700 Insomnia, unspecified: Secondary | ICD-10-CM | POA: Diagnosis not present

## 2019-04-15 DIAGNOSIS — B009 Herpesviral infection, unspecified: Secondary | ICD-10-CM | POA: Diagnosis not present

## 2019-04-15 DIAGNOSIS — R3912 Poor urinary stream: Secondary | ICD-10-CM

## 2019-04-15 DIAGNOSIS — G4733 Obstructive sleep apnea (adult) (pediatric): Secondary | ICD-10-CM | POA: Diagnosis not present

## 2019-04-15 DIAGNOSIS — Z9989 Dependence on other enabling machines and devices: Secondary | ICD-10-CM

## 2019-04-15 DIAGNOSIS — I10 Essential (primary) hypertension: Secondary | ICD-10-CM

## 2019-04-15 DIAGNOSIS — J45991 Cough variant asthma: Secondary | ICD-10-CM | POA: Diagnosis not present

## 2019-04-15 MED ORDER — FEBUXOSTAT 40 MG PO TABS: 40.0000 mg | ORAL_TABLET | Freq: Every day | ORAL | 0 refills | Status: DC

## 2019-04-15 NOTE — Progress Notes (Signed)
This service is provided via telemedicine  No vital signs collected/recorded due to the encounter was a telemedicine visit.   Location of patient (ex: home, work):  Home   Patient consents to a telephone visit:  Yes  Location of the provider (ex: office, home):  Home   Names of all persons participating in the telemedicine service and their role in the encounter:  S.Chrae B/CMA, Sherrie Mustache, NP, and Patient   Time spent on call:  9 min with medical assistant   Virtual Visit via Telephone Note  I connected with Rodney Foster on 04/15/19 at  3:15 PM EDT by telephone and verified that I am speaking with the correct person using two identifiers.   I discussed the limitations, risks, security and privacy concerns of performing an evaluation and management service by telephone and the availability of in person appointments. I also discussed with the patient that there may be a patient responsible charge related to this service. The patient expressed understanding and agreed to proceed.     Careteam: Patient Care Team: [name], NP as PCP - General (Geriatric Medicine)  Advanced Directive information    No Known Allergies  Chief Complaint  Patient presents with  . Medical Management of Chronic Issues    3 month follow-up. Tele-visit   . Advanced Directive    Discuss HCPOA/Living Will/DNR  . Best Practice Recommendations    Discuss need for Hep C Screening      HPI: Patient is a 69 y.o. male for routine follow up.   HTN-does not check blood pressure at home but would like to start. on norvasc 5 mg and losartan 100 mg daily   Herpes simplex- acyclovir 400 mg tablet for suppressive therapy. Last flare was 6 months ago.   Insomnia- taking ambien 5 mg by mouth at bedtme.   Hyperlipidemia- lipitor 20 mg by mouth daily, last LDL 111. Following heart healthy diet generally but in the last few weeks has not been doing good.   Chronic nasal congestion/asthma-  some increase in congestion but overall ok. Not taking zyrtec or Flonase. Not needing albuterol  OSA- has gotten his CPAP- reports its great.   BPH-Reports urinary stream is weak, hard time emptying. Having issues several years. PSA 2.2 checked 7 months ago. Added flomax with benefit. Getting up 1-2 times at night.   Gout- no recent flares, gets Uloric from [place] [facility] but medication made in [place]-comes through [place]. uloric is not covered by current insurance. Has been on allopurinol in the past but did not work for him, he continued to have flares when on medication.  OA right knee- had procedure done in January and still having pain. Using tylenol arthritis.   Review of Systems:  Review of Systems  Constitutional: Negative for chills, fever and malaise/fatigue.  Respiratory: Negative for cough, shortness of breath and wheezing.        Chronic congestion   Cardiovascular: Negative for chest pain, palpitations and leg swelling.  Gastrointestinal: Negative for constipation, diarrhea, heartburn, nausea and vomiting.  Genitourinary: Positive for frequency (weak stream). Negative for dysuria and urgency.  Musculoskeletal: Positive for joint pain. Negative for myalgias.  Skin: Negative for itching and rash.  Neurological: Negative for dizziness and headaches.  Psychiatric/Behavioral: Negative for depression. The patient has insomnia (controlled on ambein ).     Past Medical History:  Diagnosis Date  . Backache, unspecified   . Bronchitis   . Cough variant asthma 05/03/2017  . Genital herpes   .  Gout   . Hearing loss   . Hemorrhoids   . Hernia   . Herpes simplex   . Hyperglycemia   . Hyperlipidemia   . Hypertension   . Impotence   . Insomnia, unspecified   . Osteoarthrosis, unspecified whether generalized or localized, unspecified site   . Tinea pedis, right   . Unspecified disorder of male genital organs    Past Surgical History:  Procedure Laterality Date  . fracture  left femur  1984  . ganglion repair of left forarm  2000  . IVP  38   ML old comp fracture  . KNEE SURGERY Right 01/21/2019   [facility]   . NASAL SEPTUM SURGERY  1997  . TONSILLECTOMY  1959  . UMBILICAL HERNIA REPAIR  2002   Social History:   reports that he quit smoking about 35 years ago. He has a 15.00 pack-year smoking history. He has never used smokeless tobacco. He reports current alcohol use of about 6.0 - 7.0 standard drinks of alcohol per week. He reports that he does not use drugs.  Family History  Problem Relation Age of Onset  . COPD Mother   . Aortic aneurysm Father     Medications: Patient's Medications  New Prescriptions   No medications on file  Previous Medications   ACYCLOVIR (ZOVIRAX) 400 MG TABLET    TAKE 1 TO PREVENT HERPES AND TAKE 5 TABLETS PER DAY FOR ACTIVE HERPES   AMLODIPINE (NORVASC) 5 MG TABLET    1 by mouth daily to control blood pressure   ATORVASTATIN (LIPITOR) 20 MG TABLET    TAKE 1 TABLET BY MOUTH EVERY DAY   FEBUXOSTAT (ULORIC) 40 MG TABLET    Take 1 tablet (40 mg total) by mouth daily.   LOSARTAN (COZAAR) 100 MG TABLET    TAKE ONE TABLET BY MOUTH ONCE DAILY TO CONTROL BLOOD PRESSURE   TADALAFIL (ADCIRCA/CIALIS) 20 MG TABLET    Take 1 tablet (20 mg total) by mouth daily as needed for erectile dysfunction.   TAMSULOSIN (FLOMAX) 0.4 MG CAPS CAPSULE    Take 1 capsule (0.4 mg total) by mouth daily.   ZOLPIDEM (AMBIEN) 5 MG TABLET    Take one tablet by mouth at bedtime as needed for sleep  Modified Medications   No medications on file  Discontinued Medications   ALBUTEROL (VENTOLIN HFA) 108 (90 BASE) MCG/ACT INHALER    TAKE 2 PUFFS BY MOUTH EVERY 6 HOURS AS NEEDED FOR WHEEZE OR SHORTNESS OF BREATH   ASPIRIN EC 81 MG TABLET    Take 81 mg by mouth daily.   CETIRIZINE (ZYRTEC) 10 MG TABLET    Take 1 tablet (10 mg total) by mouth at bedtime.   FLUTICASONE (FLONASE) 50 MCG/ACT NASAL SPRAY    Place 2 sprays into both nostrils daily.   IBUPROFEN  (ADVIL,MOTRIN) 400 MG TABLET    Take 1 tablet (400 mg total) by mouth every 6 (six) hours as needed.     Physical Exam: Unable due to tele-visit.    Labs reviewed: Basic Metabolic Panel: Recent Labs    06/04/18 0852 09/05/18 0819 01/20/19 0842  NA 140 138 138  K 4.7 4.6 4.7  CL 104 102 102  CO2 25 27 28   GLUCOSE 86 102* 103*  BUN 16 13 17   CREATININE 0.78 0.88 0.87  CALCIUM 9.3 9.5 9.5   Liver Function Tests: Recent Labs    06/04/18 0852 09/05/18 0819 01/20/19 0842  AST 31 29 22   ALT  40 31 46  BILITOT 0.3 0.6 0.4  PROT 6.8 7.0 6.7   No results for input(s): LIPASE, AMYLASE in the last 8760 hours. No results for input(s): AMMONIA in the last 8760 hours. CBC: Recent Labs    06/04/18 0852 01/20/19 0842  WBC 6.3 8.9  NEUTROABS 2,703 3,836  HGB 16.1 15.5  HCT 46.2 43.2  MCV 93.5 93.1  PLT 299 272   Lipid Panel: Recent Labs    06/04/18 0852 09/05/18 0819 01/20/19 0842  CHOL 268* 206* 187  HDL 52 63 51  LDLCALC 178* 118* 111*  TRIG 213* 133 139  CHOLHDL 5.2* 3.3 3.7   TSH: No results for input(s): TSH in the last 8760 hours. A1C: Lab Results  Component Value Date   HGBA1C 5.5 01/20/2019     Assessment/Plan 1. Cough variant asthma Stable at this time. No flare in allergies and asthma.   2. Chronic gout without tophus, unspecified cause, unspecified site -controlled on current medication. Allopurinol was tired in the past without control on gout.  - febuxostat (ULORIC) 40 MG tablet; Take 1 tablet (40 mg total) by mouth daily.  Dispense: 14 tablet; Refill: 0 - Uric acid; Future  3. Herpes simplex Stable, continues on acyclovir as needed for flare  4. Essential hypertension -does not take blood pressure at home, has been controlled on losartan 100 mg by mouth daily and norvasc  - CBC with Differential/Platelet; Future  5. Mixed hyperlipidemia -continue on lipitor 20 mg daily with dietary modifications.  - COMPLETE METABOLIC PANEL WITH GFR;  Future - Lipid Panel; Future  6. OSA on CPAP Stable,  continues on CPAP  7. Benign prostatic hyperplasia with weak urinary stream Stable on flomax  8. Insomnia, unspecified type Stable on Ambien.   9. Primary osteoarthritis of right knee -ongoing pain to right knee after orthoscopic procedure, encouraged to contact orthopedic for follow up. May  Use tylenol 500 mg 2 tablets every 8 hours as needed   Next appt: 4 months with labs prior  Jessica [name]  [facility] [phone]   Follow Up Instructions:    I discussed the assessment and treatment plan with the patient. The patient was provided an opportunity to ask questions and all were answered. The patient agreed with the plan and demonstrated an understanding of the instructions.   The patient was advised to call back or seek an in-person evaluation if the symptoms worsen or if the condition fails to improve as anticipated.  I provided 14 minutes of non-face-to-face time during this encounter.  avs printed and mailed  [name], NP

## 2019-04-15 NOTE — Patient Instructions (Addendum)
To use tylenol 500 mg 2 tablets every 8 hours for pain (max dose of tylenol is 3000 mg in 24 hours) Call orthopedic in regards to increase in knee pain.

## 2019-04-16 ENCOUNTER — Telehealth: Payer: Self-pay | Admitting: *Deleted

## 2019-04-16 DIAGNOSIS — M25561 Pain in right knee: Secondary | ICD-10-CM | POA: Diagnosis not present

## 2019-04-16 DIAGNOSIS — S83241D Other tear of medial meniscus, current injury, right knee, subsequent encounter: Secondary | ICD-10-CM | POA: Diagnosis not present

## 2019-04-16 NOTE — Telephone Encounter (Signed)
I would recommend stopping, we are not prescribing this and actually did not know he was on it.

## 2019-04-16 NOTE — Telephone Encounter (Signed)
Patient notified and agreed.  

## 2019-04-16 NOTE — Telephone Encounter (Signed)
Patient stated that he spoke with you yesterday and forgot to mention that he had knee surgery in January and Murphy and Noemi Chapel gave him Diclofenac to take one in the morning and one at bedtime. They told him to ask his PCP when he could stop it. He is wanting to know when he should stop taking this medication. Please Advise.

## 2019-04-19 DIAGNOSIS — G4733 Obstructive sleep apnea (adult) (pediatric): Secondary | ICD-10-CM | POA: Diagnosis not present

## 2019-04-19 DIAGNOSIS — G4730 Sleep apnea, unspecified: Secondary | ICD-10-CM | POA: Diagnosis not present

## 2019-04-20 ENCOUNTER — Ambulatory Visit: Payer: Medicare HMO | Admitting: Nurse Practitioner

## 2019-04-21 ENCOUNTER — Telehealth: Payer: Self-pay | Admitting: *Deleted

## 2019-04-21 NOTE — Telephone Encounter (Signed)
Patient notified and agreed. Medication list updated.  

## 2019-04-21 NOTE — Telephone Encounter (Signed)
To use caution with mobic- this is an NSAID and can have adverse effects on the kidneys with long term use. Would recommend using mobic 7.5 mg (1/2 tablets) by mouth as needed pain and not to take continuously

## 2019-04-21 NOTE — Telephone Encounter (Signed)
Patient called and stated that Dr. French Ana, Orthopaedic, gave him a Rx for his Arthritis in his knee called Mobic 15mg  once daily. Dr. French Ana told him to call his PCP first, before taking,  to be sure it was ok with you for him to take. Please Advise.

## 2019-04-23 ENCOUNTER — Other Ambulatory Visit: Payer: Self-pay | Admitting: *Deleted

## 2019-04-23 DIAGNOSIS — G4700 Insomnia, unspecified: Secondary | ICD-10-CM

## 2019-04-23 MED ORDER — ZOLPIDEM TARTRATE 5 MG PO TABS: ORAL_TABLET | ORAL | 0 refills | Status: DC

## 2019-04-23 NOTE — Telephone Encounter (Signed)
Patient requested refill [place] Verified LR: 03/24/2019 Phoned to pharmacy.

## 2019-04-24 ENCOUNTER — Other Ambulatory Visit: Payer: Self-pay | Admitting: *Deleted

## 2019-04-24 DIAGNOSIS — M1A9XX0 Chronic gout, unspecified, without tophus (tophi): Secondary | ICD-10-CM

## 2019-04-24 MED ORDER — FEBUXOSTAT 40 MG PO TABS: 40.0000 mg | ORAL_TABLET | Freq: Every day | ORAL | 1 refills | Status: DC

## 2019-04-24 NOTE — Telephone Encounter (Signed)
Received fax from [place] requesting refill. Patient uses pharmacy due to cost. Printed rx and placed for Rodney Foster to sign. To fax.

## 2019-04-26 ENCOUNTER — Encounter: Payer: Self-pay | Admitting: Nurse Practitioner

## 2019-05-14 DIAGNOSIS — S83241D Other tear of medial meniscus, current injury, right knee, subsequent encounter: Secondary | ICD-10-CM | POA: Diagnosis not present

## 2019-05-17 ENCOUNTER — Other Ambulatory Visit: Payer: Self-pay | Admitting: Nurse Practitioner

## 2019-05-17 DIAGNOSIS — E782 Mixed hyperlipidemia: Secondary | ICD-10-CM

## 2019-05-19 DIAGNOSIS — G4733 Obstructive sleep apnea (adult) (pediatric): Secondary | ICD-10-CM | POA: Diagnosis not present

## 2019-05-26 ENCOUNTER — Other Ambulatory Visit: Payer: Self-pay | Admitting: *Deleted

## 2019-05-26 DIAGNOSIS — G4700 Insomnia, unspecified: Secondary | ICD-10-CM

## 2019-05-26 MED ORDER — ZOLPIDEM TARTRATE 5 MG PO TABS: ORAL_TABLET | ORAL | 0 refills | Status: DC

## 2019-05-26 NOTE — Telephone Encounter (Signed)
Patient requested refill

## 2019-06-11 ENCOUNTER — Other Ambulatory Visit: Payer: Self-pay | Admitting: Nurse Practitioner

## 2019-06-19 DIAGNOSIS — G4730 Sleep apnea, unspecified: Secondary | ICD-10-CM | POA: Diagnosis not present

## 2019-06-22 DIAGNOSIS — G4733 Obstructive sleep apnea (adult) (pediatric): Secondary | ICD-10-CM | POA: Diagnosis not present

## 2019-06-22 DIAGNOSIS — G4730 Sleep apnea, unspecified: Secondary | ICD-10-CM | POA: Diagnosis not present

## 2019-06-22 DIAGNOSIS — S83241D Other tear of medial meniscus, current injury, right knee, subsequent encounter: Secondary | ICD-10-CM | POA: Diagnosis not present

## 2019-06-25 ENCOUNTER — Other Ambulatory Visit: Payer: Self-pay

## 2019-06-25 DIAGNOSIS — G4700 Insomnia, unspecified: Secondary | ICD-10-CM

## 2019-06-25 MED ORDER — ZOLPIDEM TARTRATE 5 MG PO TABS: ORAL_TABLET | ORAL | 0 refills | Status: DC

## 2019-06-25 NOTE — Telephone Encounter (Signed)
Patient called in for refill of zolpidem not able to send myself controlled substance request sent to PCP

## 2019-07-07 ENCOUNTER — Other Ambulatory Visit: Payer: Self-pay | Admitting: *Deleted

## 2019-07-07 DIAGNOSIS — N529 Male erectile dysfunction, unspecified: Secondary | ICD-10-CM

## 2019-07-07 MED ORDER — TADALAFIL 20 MG PO TABS: 20.0000 mg | ORAL_TABLET | Freq: Every day | ORAL | 0 refills | Status: DC | PRN

## 2019-07-07 NOTE — Telephone Encounter (Signed)
Patient requested refill to be sent to [facility] due to discount.

## 2019-07-08 ENCOUNTER — Other Ambulatory Visit: Payer: Self-pay | Admitting: Nurse Practitioner

## 2019-07-19 DIAGNOSIS — G4730 Sleep apnea, unspecified: Secondary | ICD-10-CM | POA: Diagnosis not present

## 2019-07-27 ENCOUNTER — Other Ambulatory Visit: Payer: Self-pay | Admitting: *Deleted

## 2019-07-27 DIAGNOSIS — G4700 Insomnia, unspecified: Secondary | ICD-10-CM

## 2019-07-27 MED ORDER — ZOLPIDEM TARTRATE 5 MG PO TABS: ORAL_TABLET | ORAL | 0 refills | Status: DC

## 2019-07-27 NOTE — Telephone Encounter (Signed)
Patient requested refill. Phoned to pharmacy.  

## 2019-08-13 ENCOUNTER — Other Ambulatory Visit: Payer: Medicare HMO

## 2019-08-13 ENCOUNTER — Other Ambulatory Visit: Payer: Self-pay

## 2019-08-13 DIAGNOSIS — E782 Mixed hyperlipidemia: Secondary | ICD-10-CM

## 2019-08-13 DIAGNOSIS — M1A9XX0 Chronic gout, unspecified, without tophus (tophi): Secondary | ICD-10-CM

## 2019-08-13 DIAGNOSIS — I10 Essential (primary) hypertension: Secondary | ICD-10-CM

## 2019-08-17 ENCOUNTER — Other Ambulatory Visit: Payer: Medicare HMO

## 2019-08-19 ENCOUNTER — Ambulatory Visit: Payer: Medicare HMO | Admitting: Nurse Practitioner

## 2019-08-19 DIAGNOSIS — G4730 Sleep apnea, unspecified: Secondary | ICD-10-CM | POA: Diagnosis not present

## 2019-08-20 ENCOUNTER — Encounter: Payer: Self-pay | Admitting: Nurse Practitioner

## 2019-08-20 ENCOUNTER — Ambulatory Visit (INDEPENDENT_AMBULATORY_CARE_PROVIDER_SITE_OTHER): Payer: Medicare HMO | Admitting: Nurse Practitioner

## 2019-08-20 ENCOUNTER — Other Ambulatory Visit: Payer: Self-pay

## 2019-08-20 ENCOUNTER — Other Ambulatory Visit: Payer: Self-pay | Admitting: Nurse Practitioner

## 2019-08-20 VITALS — BP 125/80 | HR 89 | Temp 97.9°F | Ht 70.0 in | Wt 218.6 lb

## 2019-08-20 DIAGNOSIS — I10 Essential (primary) hypertension: Secondary | ICD-10-CM

## 2019-08-20 DIAGNOSIS — G4733 Obstructive sleep apnea (adult) (pediatric): Secondary | ICD-10-CM

## 2019-08-20 DIAGNOSIS — B009 Herpesviral infection, unspecified: Secondary | ICD-10-CM

## 2019-08-20 DIAGNOSIS — E6609 Other obesity due to excess calories: Secondary | ICD-10-CM | POA: Diagnosis not present

## 2019-08-20 DIAGNOSIS — Z23 Encounter for immunization: Secondary | ICD-10-CM

## 2019-08-20 DIAGNOSIS — Z9989 Dependence on other enabling machines and devices: Secondary | ICD-10-CM

## 2019-08-20 DIAGNOSIS — Z9189 Other specified personal risk factors, not elsewhere classified: Secondary | ICD-10-CM

## 2019-08-20 DIAGNOSIS — N401 Enlarged prostate with lower urinary tract symptoms: Secondary | ICD-10-CM | POA: Diagnosis not present

## 2019-08-20 DIAGNOSIS — J45991 Cough variant asthma: Secondary | ICD-10-CM | POA: Diagnosis not present

## 2019-08-20 DIAGNOSIS — M1711 Unilateral primary osteoarthritis, right knee: Secondary | ICD-10-CM

## 2019-08-20 DIAGNOSIS — Z6830 Body mass index (BMI) 30.0-30.9, adult: Secondary | ICD-10-CM

## 2019-08-20 DIAGNOSIS — Z1159 Encounter for screening for other viral diseases: Secondary | ICD-10-CM | POA: Diagnosis not present

## 2019-08-20 DIAGNOSIS — E782 Mixed hyperlipidemia: Secondary | ICD-10-CM | POA: Diagnosis not present

## 2019-08-20 DIAGNOSIS — R3912 Poor urinary stream: Secondary | ICD-10-CM

## 2019-08-20 LAB — CBC WITH DIFFERENTIAL/PLATELET
Absolute Monocytes: 896 cells/uL (ref 200–950)
Basophils Absolute: 104 cells/uL (ref 0–200)
Basophils Relative: 1.3 %
Eosinophils Absolute: 128 cells/uL (ref 15–500)
Eosinophils Relative: 1.6 %
HCT: 43.7 % (ref 38.5–50.0)
Hemoglobin: 15.3 g/dL (ref 13.2–17.1)
Lymphs Abs: 4328 cells/uL — ABNORMAL HIGH (ref 850–3900)
MCH: 33.3 pg — ABNORMAL HIGH (ref 27.0–33.0)
MCHC: 35 g/dL (ref 32.0–36.0)
MCV: 95.2 fL (ref 80.0–100.0)
MPV: 10.1 fL (ref 7.5–12.5)
Monocytes Relative: 11.2 %
Neutro Abs: 2544 cells/uL (ref 1500–7800)
Neutrophils Relative %: 31.8 %
Platelets: 269 10*3/uL (ref 140–400)
RBC: 4.59 10*6/uL (ref 4.20–5.80)
RDW: 12.3 % (ref 11.0–15.0)
Total Lymphocyte: 54.1 %
WBC: 8 10*3/uL (ref 3.8–10.8)

## 2019-08-20 LAB — COMPLETE METABOLIC PANEL WITH GFR
AG Ratio: 1.8 (calc) (ref 1.0–2.5)
ALT: 41 U/L (ref 9–46)
AST: 26 U/L (ref 10–35)
Albumin: 4.5 g/dL (ref 3.6–5.1)
Alkaline phosphatase (APISO): 99 U/L (ref 35–144)
BUN: 13 mg/dL (ref 7–25)
CO2: 25 mmol/L (ref 20–32)
Calcium: 9.7 mg/dL (ref 8.6–10.3)
Chloride: 104 mmol/L (ref 98–110)
Creat: 0.75 mg/dL (ref 0.70–1.18)
GFR, Est African American: 108 mL/min/{1.73_m2} (ref >=60)
GFR, Est Non African American: 93 mL/min/{1.73_m2} (ref >=60)
Globulin: 2.5 g/dL (calc) (ref 1.9–3.7)
Glucose, Bld: 100 mg/dL — ABNORMAL HIGH (ref 65–99)
Potassium: 4.6 mmol/L (ref 3.5–5.3)
Sodium: 139 mmol/L (ref 135–146)
Total Bilirubin: 0.5 mg/dL (ref 0.2–1.2)
Total Protein: 7 g/dL (ref 6.1–8.1)

## 2019-08-20 LAB — LIPID PANEL
Cholesterol: 185 mg/dL (ref <200)
HDL: 48 mg/dL (ref >=40)
LDL Cholesterol (Calc): 105 mg/dL (calc) — ABNORMAL HIGH
Non-HDL Cholesterol (Calc): 137 mg/dL (calc) — ABNORMAL HIGH (ref <130)
Total CHOL/HDL Ratio: 3.9 (calc) (ref <5.0)
Triglycerides: 202 mg/dL — ABNORMAL HIGH (ref <150)

## 2019-08-20 LAB — URIC ACID: Uric Acid, Serum: 5.2 mg/dL (ref 4.0–8.0)

## 2019-08-20 LAB — HEPATITIS C ANTIBODY

## 2019-08-20 NOTE — Patient Instructions (Signed)

## 2019-08-20 NOTE — Progress Notes (Signed)
Careteam: Patient Care Team: Rodney Chandler, NP as PCP - General (Geriatric Medicine)  Advanced Directive information Does Patient Have a Medical Advance Directive?: Yes, Type of Advance Directive: Living will, Does patient want to make changes to medical advance directive?: No - Patient declined  No Known Allergies  Chief Complaint  Patient presents with  . Medical Management of Chronic Issues    4 month follow-up and discuss labs (copy printed)   . Immunizations    Flu vaccine   . Best Practice Recommendations    Discuss need for hep c screening   . Medication Management    RX request for meloxicam, out x few weeks   . Medication Refill    Refill Norvasc- [facility]     HPI: Patient is a 69 y.o. male seen in the office today for routine follow up.   HTN-on norvasc 5 mg and losartan 100 mg daily also taking a berry supplement that a friend recommended.   Herpes simplex- acyclovir 400 mg tablet for suppressive therapy. Last flare 2 months ago. Will increase acylovir if has a flare and it "knocks it out"  Insomnia- taking ambien 5 mg by mouth at bedtme.   Hyperlipidemia- lipitor 20 mg by mouth daily; trying to do better.  Chronic nasal congestion/asthma- remarkable well; Not taking zyrtec or Flonase. Not needing albuterol  OSA- has gotten his CPAP- reports its great.   BPH-stable, berry he is taking from blood pressure helping stream and frequency. Quit flomax. Getting up 1 time at night.   Gout- no recent flares, gets Uloric from [place] pharmacy but medication made in [place]-comes through [place]. uloric is not covered by current insurance. Has been on allopurinol in the past but did not work for him, he continued to have flares when on medication. Uric acid 5.2  OA right knee- had orthoscopic repair done in January and still having pain- this is ongoing, following up with ortho next week and plans to talk to him about this. Taking tylenol to help with  pain. Using ice twice daily.  Exercising in the pool and has a gym that he is able to go to that limits people.  meloxicam given by orthopedic. Knee making it hard to exercise.  Hep C screening- had blood transfusion in the 80s  Review of Systems:  Review of Systems  Constitutional: Negative for chills, fever and malaise/fatigue.  Respiratory: Negative for cough, shortness of breath and wheezing.        Chronic congestion   Cardiovascular: Negative for chest pain, palpitations and leg swelling.  Gastrointestinal: Negative for constipation, diarrhea, heartburn, nausea and vomiting.  Genitourinary: Negative for dysuria, frequency and urgency.  Musculoskeletal: Positive for joint pain. Negative for myalgias.  Skin: Negative for itching and rash.  Neurological: Negative for dizziness and headaches.  Psychiatric/Behavioral: Negative for depression. The patient has insomnia (controlled on ambein ).     Past Medical History:  Diagnosis Date  . Backache, unspecified   . Bronchitis   . Cough variant asthma 05/03/2017  . Genital herpes   . Gout   . Hearing loss   . Hemorrhoids   . Hernia   . Herpes simplex   . Hyperglycemia   . Hyperlipidemia   . Hypertension   . Impotence   . Insomnia, unspecified   . Osteoarthrosis, unspecified whether generalized or localized, unspecified site   . Tinea pedis, right   . Unspecified disorder of male genital organs    Past Surgical History:  Procedure Laterality Date  . fracture left femur  1984  . ganglion repair of left forarm  2000  . IVP  53   ML old comp fracture  . KNEE SURGERY Right 01/21/2019   [facility] Ip   . NASAL SEPTUM SURGERY  1997  . TONSILLECTOMY  1959  . UMBILICAL HERNIA REPAIR  2002   Social History:   reports that he quit smoking about 35 years ago. He has a 15.00 pack-year smoking history. He has never used smokeless tobacco. He reports current alcohol use of about 6.0 - 7.0 standard drinks of alcohol per week. He  reports that he does not use drugs.  Family History  Problem Relation Age of Onset  . COPD Mother   . Aortic aneurysm Father     Medications: Patient's Medications  New Prescriptions   No medications on file  Previous Medications   ACYCLOVIR (ZOVIRAX) 400 MG TABLET    TAKE 1 TO PREVENT HERPES AND TAKE 5 TABLETS PER DAY FOR ACTIVE HERPES   ATORVASTATIN (LIPITOR) 20 MG TABLET    TAKE 1 TABLET BY MOUTH EVERY DAY   FEBUXOSTAT (ULORIC) 40 MG TABLET    Take 1 tablet (40 mg total) by mouth daily.   LOSARTAN (COZAAR) 100 MG TABLET    TAKE ONE TABLET BY MOUTH ONCE DAILY TO CONTROL BLOOD PRESSURE   MELOXICAM (MOBIC) 7.5 MG TABLET    Take 7.5 mg by mouth daily as needed for pain.   TADALAFIL (CIALIS) 20 MG TABLET    Take 1 tablet (20 mg total) by mouth daily as needed for erectile dysfunction.   ZOLPIDEM (AMBIEN) 5 MG TABLET    Take one tablet by mouth at bedtime as needed for sleep  Modified Medications   Modified Medication Previous Medication   AMLODIPINE (NORVASC) 5 MG TABLET amLODipine (NORVASC) 5 MG tablet      TAKE 1 TABLET MOUTH DAILY TO CONTROL BLOOD PRESSURE    1 by mouth daily to control blood pressure  Discontinued Medications   TAMSULOSIN (FLOMAX) 0.4 MG CAPS CAPSULE    Take 1 capsule (0.4 mg total) by mouth daily.    Physical Exam:  Vitals:   08/20/19 1024  BP: 125/80  Pulse: 89  Temp: 97.9 F (36.6 C)  TempSrc: Oral  SpO2: 97%  Weight: 218 lb 9.6 oz (99.2 kg)  Height: _0  (1.778 m)   Body mass index is 31.37 kg/m. Wt Readings from Last 3 Encounters:  08/20/19 218 lb 9.6 oz (99.2 kg)  01/19/19 222 lb 6.4 oz (100.9 kg)  11/07/18 214 lb (97.1 kg)    Physical Exam Constitutional:      Appearance: Normal appearance.  HENT:     Head: Normocephalic and atraumatic.     Right Ear: Tympanic membrane normal.     Nose: Congestion present.     Mouth/Throat:     Mouth: Mucous membranes are moist.  Neck:     Musculoskeletal: Neck supple.  Cardiovascular:     Rate  and Rhythm: Normal rate and regular rhythm.     Pulses: Normal pulses.  Pulmonary:     Effort: Pulmonary effort is normal.     Comments: diminished throughout Abdominal:     General: Bowel sounds are normal.     Palpations: Abdomen is soft.     Tenderness: There is no abdominal tenderness.  Musculoskeletal:     Right lower leg: No edema.     Left lower leg: No edema.  Skin:  General: Skin is warm and dry.  Neurological:     Mental Status: He is alert and oriented to person, place, and time.  Psychiatric:        Mood and Affect: Mood normal.        Behavior: Behavior normal.     Labs reviewed: Basic Metabolic Panel: Recent Labs    09/05/18 0819 01/20/19 0842 08/13/19 0840  NA 138 138 139  K 4.6 4.7 4.6  CL 102 102 104  CO2 _0 GLUCOSE 102* 103* 100*  BUN _1 CREATININE 0.88 0.87 0.75  CALCIUM 9.5 9.5 9.7   Liver Function Tests: Recent Labs    09/05/18 0819 01/20/19 0842 08/13/19 0840  AST _2 ALT 31 46 41  BILITOT 0.6 0.4 0.5  PROT 7.0 6.7 7.0   No results for input(s): LIPASE, AMYLASE in the last 8760 hours. No results for input(s): AMMONIA in the last 8760 hours. CBC: Recent Labs    01/20/19 0842 08/13/19 0840  WBC 8.9 8.0  NEUTROABS 3,836 2,544  HGB 15.5 15.3  HCT 43.2 43.7  MCV 93.1 95.2  PLT 272 269   Lipid Panel: Recent Labs    09/05/18 0819 01/20/19 0842 08/13/19 0840  CHOL 206* 187 185  HDL 63 51 48  LDLCALC 118* 111* 105*  TRIG 133 139 202*  CHOLHDL 3.3 3.7 3.9   TSH: No results for input(s): TSH in the last 8760 hours. A1C: Lab Results  Component Value Date   HGBA1C 5.5 01/20/2019     Assessment/Plan 1. Need for influenza vaccination - Flu Vaccine QUAD High Dose(Fluad)  2. Encounter for hepatitis C virus screening test for high risk patient -blood transfusion in the 80s, will screen for hepatitis c with next lab draw - Hepatitis C antibody; Future  3. Herpes simplex -stable, continues on  suppressive therapy.  4. Class 1 obesity due to excess calories without serious comorbidity with body mass index (BMI) of 30.0 to 30.9 in adult Again noted today, to continue to work on diet and exercise. Exercise is limited by knee pain.   5. Essential hypertension Controlled on current regimen  - CMP with eGFR([facility]); Future - CBC with Differential/Platelet; Future  6. Cough variant asthma Stable, no recent flares. Not requiring any medication  7. Primary osteoarthritis of right knee Ongoing after orthoscopic surgery, he is following up with orthopedic next week to discuss options for pain management. Continues to use tylenol at this time.  8. Mixed hyperlipidemia -LDL 105 and elevated triglycerides, discussed low triglyceride heart healthy diet. He is agreeable to dietary modifications at this time. Continues on lipitor 20 mg daily  - CMP with eGFR([facility]); Future - Lipid Panel; Future  9. OSA on CPAP Stable, continues to do well on cpap and tolerate per report  10. Benign prostatic hyperplasia with weak urinary stream Stable, stopped flomax and using supplement which has been helping.  Next appt: 6 months, labs prior to appt.  Carlos American. [place], [facility] [phone]

## 2019-08-24 DIAGNOSIS — S83241D Other tear of medial meniscus, current injury, right knee, subsequent encounter: Secondary | ICD-10-CM | POA: Diagnosis not present

## 2019-08-25 ENCOUNTER — Other Ambulatory Visit: Payer: Self-pay | Admitting: *Deleted

## 2019-08-25 DIAGNOSIS — G4700 Insomnia, unspecified: Secondary | ICD-10-CM

## 2019-08-25 MED ORDER — ZOLPIDEM TARTRATE 5 MG PO TABS: ORAL_TABLET | ORAL | 0 refills | Status: DC

## 2019-08-25 NOTE — Telephone Encounter (Signed)
Patient requested refill. Phoned to pharmacy.  

## 2019-08-28 ENCOUNTER — Encounter: Payer: Self-pay | Admitting: Nurse Practitioner

## 2019-08-28 ENCOUNTER — Other Ambulatory Visit: Payer: Self-pay

## 2019-08-28 ENCOUNTER — Ambulatory Visit (INDEPENDENT_AMBULATORY_CARE_PROVIDER_SITE_OTHER): Payer: Medicare HMO | Admitting: Nurse Practitioner

## 2019-08-28 VITALS — BP 132/78 | HR 78 | Temp 98.2°F | Ht 70.0 in | Wt 220.4 lb

## 2019-08-28 DIAGNOSIS — I10 Essential (primary) hypertension: Secondary | ICD-10-CM

## 2019-08-28 DIAGNOSIS — Z9989 Dependence on other enabling machines and devices: Secondary | ICD-10-CM | POA: Diagnosis not present

## 2019-08-28 DIAGNOSIS — Z01818 Encounter for other preprocedural examination: Secondary | ICD-10-CM | POA: Diagnosis not present

## 2019-08-28 DIAGNOSIS — J45991 Cough variant asthma: Secondary | ICD-10-CM

## 2019-08-28 DIAGNOSIS — M1711 Unilateral primary osteoarthritis, right knee: Secondary | ICD-10-CM | POA: Diagnosis not present

## 2019-08-28 DIAGNOSIS — G4733 Obstructive sleep apnea (adult) (pediatric): Secondary | ICD-10-CM | POA: Diagnosis not present

## 2019-08-28 DIAGNOSIS — E6609 Other obesity due to excess calories: Secondary | ICD-10-CM

## 2019-08-28 DIAGNOSIS — Z6831 Body mass index (BMI) 31.0-31.9, adult: Secondary | ICD-10-CM | POA: Diagnosis not present

## 2019-08-28 DIAGNOSIS — R0981 Nasal congestion: Secondary | ICD-10-CM

## 2019-08-28 NOTE — Progress Notes (Signed)
Careteam: Patient Care Team: [name], NP as PCP - General (Geriatric Medicine)  Advanced Directive information    No Known Allergies  Chief Complaint  Patient presents with  . Medical Management of Chronic Issues    Needs preoperative clearance for knee surgery     HPI: Patient is a 69 y.o. male seen in the office today for preop examination.  Pt with hx of htn, hyperlipidemia, gout, insomnia, herpes virus, asthma, chronic sinusitis.  Pt was seen last week for routine follow up and doing well except for increase pain to right knee, he has orthoscopic repair done in January but was not effective.  Reports he did well in January when he underwent the orthoscopic repair.  No chest pains, shortness of breath, cough, palpitation, headache, dizziness, lightheadedness, changes in vision/blurred vision, abdominal discomfort, constipation or diarrhea. Review of Systems:  Review of Systems  Constitutional: Negative for chills, fever and malaise/fatigue.  HENT: Positive for congestion (chronic, without worsening of symptoms).   Respiratory: Negative for cough, shortness of breath and wheezing.        Chronic congestion  Occasionally wheezing, none recently  Cardiovascular: Negative for chest pain, palpitations and leg swelling.  Gastrointestinal: Negative for constipation, diarrhea, heartburn, nausea and vomiting.  Genitourinary: Negative for dysuria, frequency and urgency.  Musculoskeletal: Positive for joint pain. Negative for myalgias.  Skin: Negative for itching and rash.  Neurological: Negative for dizziness and headaches.  Psychiatric/Behavioral: Negative for depression. The patient has insomnia (controlled on [place]).     Past Medical History:  Diagnosis Date  . Backache, unspecified   . Bronchitis   . Cough variant asthma 05/03/2017  . Genital herpes   . Gout   . Hearing loss   . Hemorrhoids   . Hernia   . Herpes simplex   . Hyperglycemia   .  Hyperlipidemia   . Hypertension   . Impotence   . Insomnia, unspecified   . Osteoarthrosis, unspecified whether generalized or localized, unspecified site   . Tinea pedis, right   . Unspecified disorder of male genital organs    Past Surgical History:  Procedure Laterality Date  . fracture left femur  1984  . ganglion repair of left forarm  2000  . IVP  43   ML old comp fracture  . KNEE SURGERY Right 01/21/2019   [facility] Ip   . NASAL SEPTUM SURGERY  1997  . TONSILLECTOMY  1959  . UMBILICAL HERNIA REPAIR  2002   Social History:   reports that he quit smoking about 35 years ago. He has a 15.00 pack-year smoking history. He has never used smokeless tobacco. He reports current alcohol use of about 6.0 - 7.0 standard drinks of alcohol per week. He reports that he does not use drugs.  Family History  Problem Relation Age of Onset  . COPD Mother   . Aortic aneurysm Father     Medications: Patient's Medications  New Prescriptions   No medications on file  Previous Medications   ACYCLOVIR (ZOVIRAX) 400 MG TABLET    TAKE 1 TO PREVENT HERPES AND TAKE 5 TABLETS PER DAY FOR ACTIVE HERPES   AMLODIPINE (NORVASC) 5 MG TABLET    TAKE 1 TABLET MOUTH DAILY TO CONTROL BLOOD PRESSURE   ATORVASTATIN (LIPITOR) 20 MG TABLET    TAKE 1 TABLET BY MOUTH EVERY DAY   FEBUXOSTAT (ULORIC) 40 MG TABLET    Take 1 tablet (40 mg total) by mouth daily.   LOSARTAN (COZAAR) 100  MG TABLET    TAKE ONE TABLET BY MOUTH ONCE DAILY TO CONTROL BLOOD PRESSURE   MULTIPLE VITAMINS-MINERALS (MULTIVITAMIN WITH MINERALS) TABLET    Take 1 tablet by mouth daily.   TADALAFIL (CIALIS) 20 MG TABLET    Take 1 tablet (20 mg total) by mouth daily as needed for erectile dysfunction.   ZOLPIDEM (AMBIEN) 5 MG TABLET    Take one tablet by mouth at bedtime as needed for sleep  Modified Medications   No medications on file  Discontinued Medications   MELOXICAM (MOBIC) 7.5 MG TABLET    Take 7.5 mg by mouth daily as needed for pain.     Physical Exam:  Vitals:   08/28/19 1342  BP: 132/78  Pulse: 78  Temp: 98.2 F (36.8 C)  TempSrc: Oral  SpO2: 97%  Weight: 220 lb 6.4 oz (100 kg)  Height: 5\' 10"  (1.778 m)   Body mass index is 31.62 kg/m. Wt Readings from Last 3 Encounters:  08/28/19 220 lb 6.4 oz (100 kg)  08/20/19 218 lb 9.6 oz (99.2 kg)  01/19/19 222 lb 6.4 oz (100.9 kg)    Physical Exam Constitutional:      Appearance: Normal appearance.  HENT:     Head: Normocephalic and atraumatic.     Nose: Congestion present.     Mouth/Throat:     Mouth: Mucous membranes are moist.  Eyes:     Pupils: Pupils are equal, round, and reactive to light.  Neck:     Musculoskeletal: Neck supple.  Cardiovascular:     Rate and Rhythm: Normal rate and regular rhythm.     Pulses: Normal pulses.  Pulmonary:     Effort: Pulmonary effort is normal.     Comments: diminished throughout Abdominal:     General: Bowel sounds are normal.     Palpations: Abdomen is soft.     Tenderness: There is no abdominal tenderness.  Musculoskeletal:     Right lower leg: No edema.     Left lower leg: No edema.  Skin:    General: Skin is warm and dry.  Neurological:     Mental Status: He is alert and oriented to person, place, and time.  Psychiatric:        Mood and Affect: Mood normal.        Behavior: Behavior normal.     Labs reviewed: Basic Metabolic Panel: Recent Labs    09/05/18 0819 01/20/19 0842 08/13/19 0840  NA 138 138 139  K 4.6 4.7 4.6  CL 102 102 104  CO2 27 28 25   GLUCOSE 102* 103* 100*  BUN 13 17 13   CREATININE 0.88 0.87 0.75  CALCIUM 9.5 9.5 9.7   Liver Function Tests: Recent Labs    09/05/18 0819 01/20/19 0842 08/13/19 0840  AST 29 22 26   ALT 31 46 41  BILITOT 0.6 0.4 0.5  PROT 7.0 6.7 7.0   No results for input(s): LIPASE, AMYLASE in the last 8760 hours. No results for input(s): AMMONIA in the last 8760 hours. CBC: Recent Labs    01/20/19 0842 08/13/19 0840  WBC 8.9 8.0  NEUTROABS  3,836 2,544  HGB 15.5 15.3  HCT 43.2 43.7  MCV 93.1 95.2  PLT 272 269   Lipid Panel: Recent Labs    09/05/18 0819 01/20/19 0842 08/13/19 0840  CHOL 206* 187 185  HDL 63 51 48  LDLCALC 118* 111* 105*  TRIG 133 139 202*  CHOLHDL 3.3 3.7 3.9   TSH: No results  for input(s): TSH in the last 8760 hours. A1C: Lab Results  Component Value Date   HGBA1C 5.5 01/20/2019     Assessment/Plan 1. Preop examination -pt overall feels except for end stage arthritis in right knee. Chronic conditions are optimally controlled at this time. -without significant heart history, blood pressure is well controlled.  -BMI 31 and he would benefit from weight loss  -A1c 5.5 in January  -not taking any anticoagulation  - EKG 12-Lead- NSR rate 75  2. Cough variant asthma Controlled, and has been stable. Taking mucinex DM as needed otherwise not on any medication.   3. Sinus congestion Stable without any exacerbation of symptoms.   4. Class 1 obesity due to excess calories without serious comorbidity with body mass index (BMI) of 31.0 to 31.9 in adult -BMI of 31;needs to lose weight, we discussed this again.   5. Essential hypertension -well controlled on norvasc 5 mg daily and losartan 100 mg by mouth daily   6. Primary osteoarthritis of right knee End stage, has failed other treatments including orthoscopic repair, medication and therapies, plans to undergo a total knee  7. OSA on CPAP -stable, will need to continue CPAP throughout postop course.   Next appt: as scheduled.  Rodney Foster. [place], [facility] [phone]

## 2019-09-02 DIAGNOSIS — H5203 Hypermetropia, bilateral: Secondary | ICD-10-CM | POA: Diagnosis not present

## 2019-09-03 ENCOUNTER — Ambulatory Visit: Payer: Self-pay | Admitting: Physician Assistant

## 2019-09-03 DIAGNOSIS — S83241D Other tear of medial meniscus, current injury, right knee, subsequent encounter: Secondary | ICD-10-CM | POA: Diagnosis not present

## 2019-09-03 NOTE — H&P (View-Only) (Signed)
TOTAL KNEE ADMISSION H&P  Patient is being admitted for right total knee arthroplasty.  Subjective:  Chief Complaint:right knee pain.  HPI: Rodney Foster, 69 y.o. male, has a history of pain and functional disability in the right knee due to arthritis and has failed non-surgical conservative treatments for greater than 12 weeks to includeNSAID's and/or analgesics, corticosteriod injections and activity modification.  Onset of symptoms was gradual, starting 5 years ago with gradually worsening course since that time. The patient noted prior procedures on the knee to include  arthroscopy and menisectomy on the right knee(s).  Patient currently rates pain in the right knee(s) at 10 out of 10 with activity. Patient has night pain, worsening of pain with activity and weight bearing, pain that interferes with activities of daily living, pain with passive range of motion, crepitus and joint swelling.  Patient has evidence of periarticular osteophytes and joint space narrowing by imaging studies.There is no active infection.  Patient Active Problem List   Diagnosis Date Noted  . Cough variant asthma 05/03/2017  . Abnormal CXR 05/03/2017  . Sinus congestion 04/10/2017  . Chronic bronchitis ([place]) 04/10/2017  . PVC (premature ventricular contraction) 10/10/2016  . Obese 03/28/2016  . Pain in joint, ankle and foot 06/01/2015  . Tinea unguium 03/15/2015  . Vitreous floater 03/15/2015  . Herpes simplex   . Hyperlipidemia   . Gout   . Hypertension   . Hyperglycemia   . Insomnia   . Impotence    Past Medical History:  Diagnosis Date  . Backache, unspecified   . Bronchitis   . Cough variant asthma 05/03/2017  . Genital herpes   . Gout   . Hearing loss   . Hemorrhoids   . Hernia   . Herpes simplex   . Hyperglycemia   . Hyperlipidemia   . Hypertension   . Impotence   . Insomnia, unspecified   . Osteoarthrosis, unspecified whether generalized or localized, unspecified site   . Tinea pedis,  right   . Unspecified disorder of male genital organs     Past Surgical History:  Procedure Laterality Date  . fracture left femur  1984  . ganglion repair of left forarm  2000  . IVP  33   ML old comp fracture  . KNEE SURGERY Right 01/21/2019   Ralie[facility] Ip   . NASAL SEPTUM SURGERY  1997  . TONSILLECTOMY  1959  . UMBILICAL HERNIA REPAIR  2002    Current Outpatient Medications  Medication Sig Dispense Refill Last Dose  . acyclovir (ZOVIRAX) 400 MG tablet TAKE 1 TO PREVENT HERPES AND TAKE 5 TABLETS PER DAY FOR ACTIVE HERPES 100 tablet 1 Taking  . amLODipine (NORVASC) 5 MG tablet TAKE 1 TABLET MOUTH DAILY TO CONTROL BLOOD PRESSURE 90 tablet 1 Taking  . atorvastatin (LIPITOR) 20 MG tablet TAKE 1 TABLET BY MOUTH EVERY DAY 90 tablet 1 Taking  . febuxostat (ULORIC) 40 MG tablet Take 1 tablet (40 mg total) by mouth daily. 90 tablet 1 Taking  . losartan (COZAAR) 100 MG tablet TAKE ONE TABLET BY MOUTH ONCE DAILY TO CONTROL BLOOD PRESSURE 90 tablet 1 Taking  . Multiple Vitamins-Minerals (MULTIVITAMIN WITH MINERALS) tablet Take 1 tablet by mouth daily.   Taking  . tadalafil (CIALIS) 20 MG tablet Take 1 tablet (20 mg total) by mouth daily as needed for erectile dysfunction. 10 tablet 0 Taking  . zolpidem (AMBIEN) 5 MG tablet Take one tablet by mouth at bedtime as needed for sleep 30 tablet 0  Taking   No current facility-administered medications for this visit.    No Known Allergies  Social History   Tobacco Use  . Smoking status: Former Smoker    Packs/day: 1.00    Years: 15.00    Pack years: 15.00    Quit date: 01/01/1984    Years since quitting: 35.6  . Smokeless tobacco: Never Used  Substance Use Topics  . Alcohol use: Yes    Alcohol/week: 6.0 - 7.0 standard drinks    Types: 6 - 7 Glasses of wine per week    Comment: 3-4 a night of wine or coctail    Family History  Problem Relation Age of Onset  . COPD Mother   . Aortic aneurysm Father      Review of Systems   Gastrointestinal: Positive for blood in stool.  Musculoskeletal: Positive for joint pain.  All other systems reviewed and are negative.   Objective:  Physical Exam  Constitutional: He is oriented to person, place, and time. He appears well-developed and well-nourished. No distress.  HENT:  Head: Normocephalic and atraumatic.  Eyes: Pupils are equal, round, and reactive to light. Conjunctivae and EOM are normal.  Neck: Normal range of motion. Neck supple.  Cardiovascular: Normal rate, regular rhythm, normal heart sounds and intact distal pulses.  Respiratory: Effort normal and breath sounds normal. No respiratory distress. He has no wheezes.  GI: Soft. Bowel sounds are normal. He exhibits no distension. There is no abdominal tenderness.  Musculoskeletal:     Right knee: He exhibits decreased range of motion, swelling and bony tenderness. He exhibits no erythema. Tenderness found.  Lymphadenopathy:    He has no cervical adenopathy.  Neurological: He is alert and oriented to person, place, and time.  Skin: Skin is warm and dry. No rash noted. No erythema.  Psychiatric: He has a normal mood and affect. His behavior is normal.    Vital signs in last 24 hours: @VSRANGES @  Labs:   Estimated body mass index is 31.62 kg/m as calculated from the following:   Height as of 08/28/19: 5\' 10"  (1.778 m).   Weight as of 08/28/19: 100 kg.   Imaging Review Plain radiographs demonstrate moderate degenerative joint disease of the right knee(s). The overall alignment issignificant varus. The bone quality appears to be good for age and reported activity level.      Assessment/Plan:  End stage arthritis, right knee   The patient history, physical examination, clinical judgment of the provider and imaging studies are consistent with end stage degenerative joint disease of the right knee(s) and total knee arthroplasty is deemed medically necessary. The treatment options including medical  management, injection therapy arthroscopy and arthroplasty were discussed at length. The risks and benefits of total knee arthroplasty were presented and reviewed. The risks due to aseptic loosening, infection, stiffness, patella tracking problems, thromboembolic complications and other imponderables were discussed. The patient acknowledged the explanation, agreed to proceed with the plan and consent was signed. Patient is being admitted for inpatient treatment for surgery, pain control, PT, OT, prophylactic antibiotics, VTE prophylaxis, progressive ambulation and ADL's and discharge planning. The patient is planning to be discharged home with outpt PT    Anticipated LOS equal to or greater than 2 midnights due to - Age 74 and older with one or more of the following:  - Obesity  - Expected need for hospital services (PT, OT, Nursing) required for safe  discharge  - Anticipated need for postoperative skilled nursing care or inpatient rehab  -  Active co-morbidities: Diabetes and Respiratory Failure/COPD OR   - Unanticipated findings during/Post Surgery: None  - Patient is a high risk of re-admission due to: None

## 2019-09-03 NOTE — H&P (Signed)
TOTAL KNEE ADMISSION H&P  Patient is being admitted for right total knee arthroplasty.  Subjective:  Chief Complaint:right knee pain.  HPI: Rodney Foster, 69 y.o. male, has a history of pain and functional disability in the right knee due to arthritis and has failed non-surgical conservative treatments for greater than 12 weeks to includeNSAID's and/or analgesics, corticosteriod injections and activity modification.  Onset of symptoms was gradual, starting 5 years ago with gradually worsening course since that time. The patient noted prior procedures on the knee to include  arthroscopy and menisectomy on the right knee(s).  Patient currently rates pain in the right knee(s) at 10 out of 10 with activity. Patient has night pain, worsening of pain with activity and weight bearing, pain that interferes with activities of daily living, pain with passive range of motion, crepitus and joint swelling.  Patient has evidence of periarticular osteophytes and joint space narrowing by imaging studies.There is no active infection.  Patient Active Problem List   Diagnosis Date Noted  . Cough variant asthma 05/03/2017  . Abnormal CXR 05/03/2017  . Sinus congestion 04/10/2017  . Chronic bronchitis ([place]) 04/10/2017  . PVC (premature ventricular contraction) 10/10/2016  . Obese 03/28/2016  . Pain in joint, ankle and foot 06/01/2015  . Tinea unguium 03/15/2015  . Vitreous floater 03/15/2015  . Herpes simplex   . Hyperlipidemia   . Gout   . Hypertension   . Hyperglycemia   . Insomnia   . Impotence    Past Medical History:  Diagnosis Date  . Backache, unspecified   . Bronchitis   . Cough variant asthma 05/03/2017  . Genital herpes   . Gout   . Hearing loss   . Hemorrhoids   . Hernia   . Herpes simplex   . Hyperglycemia   . Hyperlipidemia   . Hypertension   . Impotence   . Insomnia, unspecified   . Osteoarthrosis, unspecified whether generalized or localized, unspecified site   . Tinea pedis,  right   . Unspecified disorder of male genital organs     Past Surgical History:  Procedure Laterality Date  . fracture left femur  1984  . ganglion repair of left forarm  2000  . IVP  65   ML old comp fracture  . KNEE SURGERY Right 01/21/2019   Raliegh Ip   . NASAL SEPTUM SURGERY  1997  . TONSILLECTOMY  1959  . UMBILICAL HERNIA REPAIR  2002    Current Outpatient Medications  Medication Sig Dispense Refill Last Dose  . acyclovir (ZOVIRAX) 400 MG tablet TAKE 1 TO PREVENT HERPES AND TAKE 5 TABLETS PER DAY FOR ACTIVE HERPES 100 tablet 1 Taking  . amLODipine (NORVASC) 5 MG tablet TAKE 1 TABLET MOUTH DAILY TO CONTROL BLOOD PRESSURE 90 tablet 1 Taking  . atorvastatin (LIPITOR) 20 MG tablet TAKE 1 TABLET BY MOUTH EVERY DAY 90 tablet 1 Taking  . febuxostat (ULORIC) 40 MG tablet Take 1 tablet (40 mg total) by mouth daily. 90 tablet 1 Taking  . losartan (COZAAR) 100 MG tablet TAKE ONE TABLET BY MOUTH ONCE DAILY TO CONTROL BLOOD PRESSURE 90 tablet 1 Taking  . Multiple Vitamins-Minerals (MULTIVITAMIN WITH MINERALS) tablet Take 1 tablet by mouth daily.   Taking  . tadalafil (CIALIS) 20 MG tablet Take 1 tablet (20 mg total) by mouth daily as needed for erectile dysfunction. 10 tablet 0 Taking  . zolpidem (AMBIEN) 5 MG tablet Take one tablet by mouth at bedtime as needed for sleep 30 tablet 0  Taking   No current facility-administered medications for this visit.    No Known Allergies  Social History   Tobacco Use  . Smoking status: Former Smoker    Packs/day: 1.00    Years: 15.00    Pack years: 15.00    Quit date: 01/01/1984    Years since quitting: 35.6  . Smokeless tobacco: Never Used  Substance Use Topics  . Alcohol use: Yes    Alcohol/week: 6.0 - 7.0 standard drinks    Types: 6 - 7 Glasses of wine per week    Comment: 3-4 a night of wine or coctail    Family History  Problem Relation Age of Onset  . COPD Mother   . Aortic aneurysm Father      Review of Systems   Gastrointestinal: Positive for blood in stool.  Musculoskeletal: Positive for joint pain.  All other systems reviewed and are negative.   Objective:  Physical Exam  Constitutional: He is oriented to person, place, and time. He appears well-developed and well-nourished. No distress.  HENT:  Head: Normocephalic and atraumatic.  Eyes: Pupils are equal, round, and reactive to light. Conjunctivae and EOM are normal.  Neck: Normal range of motion. Neck supple.  Cardiovascular: Normal rate, regular rhythm, normal heart sounds and intact distal pulses.  Respiratory: Effort normal and breath sounds normal. No respiratory distress. He has no wheezes.  GI: Soft. Bowel sounds are normal. He exhibits no distension. There is no abdominal tenderness.  Musculoskeletal:     Right knee: He exhibits decreased range of motion, swelling and bony tenderness. He exhibits no erythema. Tenderness found.  Lymphadenopathy:    He has no cervical adenopathy.  Neurological: He is alert and oriented to person, place, and time.  Skin: Skin is warm and dry. No rash noted. No erythema.  Psychiatric: He has a normal mood and affect. His behavior is normal.    Vital signs in last 24 hours: @VSRANGES @  Labs:   Estimated body mass index is 31.62 kg/m as calculated from the following:   Height as of 08/28/19: 5\' 10"  (1.778 m).   Weight as of 08/28/19: 100 kg.   Imaging Review Plain radiographs demonstrate moderate degenerative joint disease of the right knee(s). The overall alignment issignificant varus. The bone quality appears to be good for age and reported activity level.      Assessment/Plan:  End stage arthritis, right knee   The patient history, physical examination, clinical judgment of the provider and imaging studies are consistent with end stage degenerative joint disease of the right knee(s) and total knee arthroplasty is deemed medically necessary. The treatment options including medical  management, injection therapy arthroscopy and arthroplasty were discussed at length. The risks and benefits of total knee arthroplasty were presented and reviewed. The risks due to aseptic loosening, infection, stiffness, patella tracking problems, thromboembolic complications and other imponderables were discussed. The patient acknowledged the explanation, agreed to proceed with the plan and consent was signed. Patient is being admitted for inpatient treatment for surgery, pain control, PT, OT, prophylactic antibiotics, VTE prophylaxis, progressive ambulation and ADL's and discharge planning. The patient is planning to be discharged home with outpt PT    Anticipated LOS equal to or greater than 2 midnights due to - Age 74 and older with one or more of the following:  - Obesity  - Expected need for hospital services (PT, OT, Nursing) required for safe  discharge  - Anticipated need for postoperative skilled nursing care or inpatient rehab  -  Active co-morbidities: Diabetes and Respiratory Failure/COPD OR   - Unanticipated findings during/Post Surgery: None  - Patient is a high risk of re-admission due to: None

## 2019-09-09 ENCOUNTER — Encounter: Payer: Self-pay | Admitting: Nurse Practitioner

## 2019-09-09 ENCOUNTER — Ambulatory Visit: Payer: Self-pay

## 2019-09-11 NOTE — Patient Instructions (Addendum)
DUE TO COVID-19 ONLY ONE VISITOR IS ALLOWED TO COME WITH YOU AND STAY IN THE WAITING ROOM ONLY DURING PRE OP AND PROCEDURE DAY OF SURGERY. THE 1 VISITOR MAY VISIT WITH YOU AFTER SURGERY IN YOUR PRIVATE ROOM DURING VISITING HOURS ONLY!  YOU NEED TO HAVE A COVID 19 TEST ON 09-22-19  @ 2:45 PM, THIS TEST MUST BE DONE BEFORE SURGERY, COME  [place], [place] [place] , [id number].  ([place]) ONCE YOUR COVID TEST IS COMPLETED, PLEASE BEGIN THE QUARANTINE INSTRUCTIONS AS OUTLINED IN YOUR HANDOUT.                Rodney Foster  09/11/2019   Your procedure is scheduled on: Friday 09/25/2019   Report to [facility] Main  Entrance              Report to Short Stay at 5:30 AM    Call this number if you have problems the morning of surgery [phone]    Remember: Do not eat food  :After Midnight.               NO SOLID FOOD AFTER MIDNIGHT THE NIGHT PRIOR TO SURGERY. NOTHING BY MOUTH EXCEPT CLEAR LIQUIDS FROM MIDNIGHT UP  UNTIL 04:30 AM .               PLEASE FINISH ENSURE DRINK PER SURGEON ORDER  WHICH NEEDS TO BE COMPLETED AT 04:30 AM .   CLEAR LIQUID DIET   Foods Allowed                                                                     Foods Excluded  Coffee and tea, regular and decaf                             liquids that you cannot  Plain Jell-O any favor except red or purple                                           see through such as: Fruit ices (not with fruit pulp)                                     milk, soups, orange juice  Iced Popsicles                                    All solid food Carbonated beverages, regular and diet                                    Cranberry, grape and apple juices Sports drinks like Gatorade Lightly seasoned clear broth or consume(fat free) Sugar, honey syrup   _____________________________________________________________________                Take these medicines the morning of surgery with A SIP OF WATER:  Amlodipine (Norvasc), Omeprazole (Prilosec)  BRUSH YOUR TEETH MORNING OF SURGERY AND RINSE YOUR MOUTH OUT, NO CHEWING GUM CANDY OR MINTS.                               You may not have any metal on your body including hair pins and              piercings     Do not wear jewelry, cologne, lotions, powders or deodorant              Men may shave face and neck.   Do not bring valuables to the hospital. [place].  Contacts, dentures or bridgework may not be worn into surgery.   Leave suitcase in the car. After surgery it may be brought to your room.                  Please read over the following fact sheets you were given: _____________________________________________________________________             [facility] - Preparing for Surgery Before surgery, you can play an important role.  Because skin is not sterile, your skin needs to be as free of germs as possible.  You can reduce the number of germs on your skin by washing with CHG (chlorahexidine gluconate) soap before surgery.  CHG is an antiseptic cleaner which kills germs and bonds with the skin to continue killing germs even after washing. Please DO NOT use if you have an allergy to CHG or antibacterial soaps.  If your skin becomes reddened/irritated stop using the CHG and inform your nurse when you arrive at Short Stay. Do not shave (including legs and underarms) for at least 48 hours prior to the first CHG shower.  You may shave your face/neck. Please follow these instructions carefully:  1.  Shower with CHG Soap the night before surgery and the  morning of Surgery.  2.  If you choose to wash your hair, wash your hair first as usual with your  normal  shampoo.  3.  After you shampoo, rinse your hair and body thoroughly to remove the  shampoo.                           4.  Use CHG as you would any other liquid soap.  You can apply chg directly  to the skin and wash                        Gently with a scrungie or clean washcloth.  5.  Apply the CHG Soap to your body ONLY FROM THE NECK DOWN.   Do not use on face/ open                           Wound or open sores. Avoid contact with eyes, ears mouth and genitals (private parts).                       Wash face,  Genitals (private parts) with your normal soap.             6.  Wash thoroughly, paying special attention to the area where your surgery  will  be performed.  7.  Thoroughly rinse your body with warm water from the neck down.  8.  DO NOT shower/wash with your normal soap after using and rinsing off  the CHG Soap.                9.  Pat yourself dry with a clean towel.            10.  Wear clean pajamas.            11.  Place clean sheets on your bed the night of your first shower and do not  sleep with pets. Day of Surgery : Do not apply any lotions/deodorants the morning of surgery.  Please wear clean clothes to the hospital/surgery center.  FAILURE TO FOLLOW THESE INSTRUCTIONS MAY RESULT IN THE CANCELLATION OF YOUR SURGERY PATIENT SIGNATURE_________________________________  NURSE SIGNATURE__________________________________  ________________________________________________________________________   Rodney Foster  An incentive spirometer is a tool that can help keep your lungs clear and active. This tool measures how well you are filling your lungs with each breath. Taking long deep breaths may help reverse or decrease the chance of developing breathing (pulmonary) problems (especially infection) following:  A long period of time when you are unable to move or be active. BEFORE THE PROCEDURE   If the spirometer includes an indicator to show your best effort, your nurse or respiratory therapist will set it to a desired goal.  If possible, sit up straight or lean slightly forward. Try not to slouch.  Hold the incentive spirometer in an upright position. INSTRUCTIONS FOR USE  1. Sit on the edge of  your bed if possible, or sit up as far as you can in bed or on a chair. 2. Hold the incentive spirometer in an upright position. 3. Breathe out normally. 4. Place the mouthpiece in your mouth and seal your lips tightly around it. 5. Breathe in slowly and as deeply as possible, raising the piston or the ball toward the top of the column. 6. Hold your breath for 3-5 seconds or for as long as possible. Allow the piston or ball to fall to the bottom of the column. 7. Remove the mouthpiece from your mouth and breathe out normally. 8. Rest for a few seconds and repeat Steps 1 through 7 at least 10 times every 1-2 hours when you are awake. Take your time and take a few normal breaths between deep breaths. 9. The spirometer may include an indicator to show your best effort. Use the indicator as a goal to work toward during each repetition. 10. After each set of 10 deep breaths, practice coughing to be sure your lungs are clear. If you have an incision (the cut made at the time of surgery), support your incision when coughing by placing a pillow or rolled up towels firmly against it. Once you are able to get out of bed, walk around indoors and cough well. You may stop using the incentive spirometer when instructed by your caregiver.  RISKS AND COMPLICATIONS  Take your time so you do not get dizzy or light-headed.  If you are in pain, you may need to take or ask for pain medication before doing incentive spirometry. It is harder to take a deep breath if you are having pain. AFTER USE  Rest and breathe slowly and easily.  It can be helpful to keep track of a log of your progress. Your caregiver can provide you with a simple table to help with this. If  you are using the spirometer at home, follow these instructions: [place] IF:   You are having difficultly using the spirometer.  You have trouble using the spirometer as often as instructed.  Your pain medication is not giving enough relief  while using the spirometer.  You develop fever of 100.5 F (38.1 C) or higher. SEEK IMMEDIATE MEDICAL CARE IF:   You cough up bloody sputum that had not been present before.  You develop fever of 102 F (38.9 C) or greater.  You develop worsening pain at or near the incision site. MAKE SURE YOU:   Understand these instructions.  Will watch your condition.  Will get help right away if you are not doing well or get worse. Document Released: 04/29/2007 Document Revised: 03/10/2012 Document Reviewed: 06/30/2007 ExitCare Patient Information 2014 ExitCare, [place].   ________________________________________________________________________  WHAT IS A BLOOD TRANSFUSION? Blood Transfusion Information  A transfusion is the replacement of blood or some of its parts. Blood is made up of multiple cells which provide different functions.  Red blood cells carry oxygen and are used for blood loss replacement.  White blood cells fight against infection.  Platelets control bleeding.  Plasma helps clot blood.  Other blood products are available for specialized needs, such as hemophilia or other clotting disorders. BEFORE THE TRANSFUSION  Who gives blood for transfusions?   Healthy volunteers who are fully evaluated to make sure their blood is safe. This is blood bank blood. Transfusion therapy is the safest it has ever been in the practice of medicine. Before blood is taken from a donor, a complete history is taken to make sure that person has no history of diseases nor engages in risky social behavior (examples are intravenous drug use or sexual activity with multiple partners). The donor's travel history is screened to minimize risk of transmitting infections, such as malaria. The donated blood is tested for signs of infectious diseases, such as HIV and hepatitis. The blood is then tested to be sure it is compatible with you in order to minimize the chance of a transfusion reaction. If you or  a relative donates blood, this is often done in anticipation of surgery and is not appropriate for emergency situations. It takes many days to process the donated blood. RISKS AND COMPLICATIONS Although transfusion therapy is very safe and saves many lives, the main dangers of transfusion include:   Getting an infectious disease.  Developing a transfusion reaction. This is an allergic reaction to something in the blood you were given. Every precaution is taken to prevent this. The decision to have a blood transfusion has been considered carefully by your caregiver before blood is given. Blood is not given unless the benefits outweigh the risks. AFTER THE TRANSFUSION  Right after receiving a blood transfusion, you will usually feel much better and more energetic. This is especially true if your red blood cells have gotten low (anemic). The transfusion raises the level of the red blood cells which carry oxygen, and this usually causes an energy increase.  The nurse administering the transfusion will monitor you carefully for complications. HOME CARE INSTRUCTIONS  No special instructions are needed after a transfusion. You may find your energy is better. Speak with your caregiver about any limitations on activity for underlying diseases you may have. SEEK MEDICAL CARE IF:   Your condition is not improving after your transfusion.  You develop redness or irritation at the intravenous (IV) site. SEEK IMMEDIATE MEDICAL CARE IF:  Any of the  following symptoms occur over the next 12 hours:  Shaking chills.  You have a temperature by mouth above 102 F (38.9 C), not controlled by medicine.  Chest, back, or muscle pain.  People around you feel you are not acting correctly or are confused.  Shortness of breath or difficulty breathing.  Dizziness and fainting.  You get a rash or develop hives.  You have a decrease in urine output.  Your urine turns a dark color or changes to pink, red, or  brown. Any of the following symptoms occur over the next 10 days:  You have a temperature by mouth above 102 F (38.9 C), not controlled by medicine.  Shortness of breath.  Weakness after normal activity.  The white part of the eye turns yellow (jaundice).  You have a decrease in the amount of urine or are urinating less often.  Your urine turns a dark color or changes to pink, red, or brown. Document Released: 12/14/2000 Document Revised: 03/10/2012 Document Reviewed: 08/02/2008 [facility] Patient Information 2014 [place], [place].  _______________________________________________________________________

## 2019-09-15 ENCOUNTER — Other Ambulatory Visit: Payer: Self-pay

## 2019-09-15 ENCOUNTER — Ambulatory Visit (INDEPENDENT_AMBULATORY_CARE_PROVIDER_SITE_OTHER): Payer: Medicare HMO | Admitting: Nurse Practitioner

## 2019-09-15 ENCOUNTER — Encounter: Payer: Self-pay | Admitting: Nurse Practitioner

## 2019-09-15 VITALS — Ht 70.0 in | Wt 220.4 lb

## 2019-09-15 DIAGNOSIS — Z0000 Encounter for general adult medical examination without abnormal findings: Secondary | ICD-10-CM | POA: Diagnosis not present

## 2019-09-15 NOTE — Progress Notes (Signed)
Subjective:   Rodney Foster is a 69 y.o. male who presents for Medicare Annual/Subsequent preventive examination.  Review of Systems:   Cardiac Risk Factors include: advanced age (>42men, >63 women);hypertension;male gender;obesity (BMI >30kg/m2);dyslipidemia     Objective:    Vitals: Ht 5\' 10"  (1.778 m)   Wt 220 lb 6.4 oz (100 kg)   BMI 31.62 kg/m   Body mass index is 31.62 kg/m.  Advanced Directives 09/15/2019 09/15/2019 08/20/2019 01/19/2019 10/23/2018 09/05/2018 08/21/2018  Does Patient Have a Medical Advance Directive? No No Yes Yes Yes Yes Yes  Type of Advance Directive - - Living will Living will Living will Living will Living will  Does patient want to make changes to medical advance directive? - - No - Patient declined No - Patient declined - No - Patient declined -  Copy of [place] in [place]  Would patient like information on creating a medical advance directive? - - - - - - -    Tobacco Social History   Tobacco Use  Smoking Status Former Smoker  . Packs/day: 1.00  . Years: 15.00  . Pack years: 15.00  . Quit date: 01/01/1984  . Years since quitting: 35.7  Smokeless Tobacco Never Used     Counseling given: Not Answered   Clinical Intake:  Pre-visit preparation completed: Yes  Pain : 0-10 Pain Score: 8  Pain Type: Chronic pain Pain Location: Knee Pain Orientation: Right Pain Descriptors / Indicators: Jabbing Pain Onset: More than a month ago Pain Frequency: Constant Pain Relieving Factors: none Effect of Pain on Daily Activities: unable to exercise, hard to walk  Pain Relieving Factors: none  BMI - recorded: 31.62 Nutritional Status: BMI > 30  Obese Nutritional Risks: None Diabetes: No  How often do you need to have someone help you when you read instructions, pamphlets, or other written materials from your doctor or pharmacy?: 1 - Never What is the last grade level you completed in school?: 12th  Interpreter  Needed?: No     Past Medical History:  Diagnosis Date  . Backache, unspecified   . Bronchitis   . Cough variant asthma 05/03/2017  . Genital herpes   . Gout   . Hearing loss   . Hemorrhoids   . Hernia   . Herpes simplex   . Hyperglycemia   . Hyperlipidemia   . Hypertension   . Impotence   . Insomnia, unspecified   . Osteoarthrosis, unspecified whether generalized or localized, unspecified site   . Tinea pedis, right   . Unspecified disorder of male genital organs    Past Surgical History:  Procedure Laterality Date  . fracture left femur  1984  . ganglion repair of left forarm  2000  . IVP  68   ML old comp fracture  . KNEE SURGERY Right 01/21/2019   [facility]ie[facility] Ip   . NASAL SEPTUM SURGERY  1997  . TONSILLECTOMY  1959  . UMBILICAL HERNIA REPAIR  2002   Family History  Problem Relation Age of Onset  . COPD Mother   . Aortic aneurysm Father    Social History   Socioeconomic History  . Marital status: Divorced    Spouse name: Not on file  . Number of children: Not on file  . Years of education: Not on file  . Highest education level: Not on file  Occupational History  . Not on file  Social Needs  . Financial resource strain: Not  hard at all  . Food insecurity    Worry: Never true    Inability: Never true  . Transportation needs    Medical: No    Non-medical: No  Tobacco Use  . Smoking status: Former Smoker    Packs/day: 1.00    Years: 15.00    Pack years: 15.00    Quit date: 01/01/1984    Years since quitting: 35.7  . Smokeless tobacco: Never Used  Substance and Sexual Activity  . Alcohol use: Yes    Alcohol/week: 6.0 - 7.0 standard drinks    Types: 6 - 7 Glasses of wine per week    Comment: 3-4 a night of wine or [place]  . Drug use: No  . Sexual activity: Yes  Lifestyle  . Physical activity    Days per week: 4 days    Minutes per session: 30 min  . Stress: Only a little  Relationships  . Social connections    Talks on phone: More than three  times a week    Gets together: More than three times a week    Attends religious service: Never    Active member of club or organization: No    Attends meetings of clubs or organizations: Never    Relationship status: Divorced  Other Topics Concern  . Not on file  Social History Narrative  . Not on file    Outpatient Encounter Medications as of 09/15/2019  Medication Sig  . acyclovir (ZOVIRAX) 400 MG tablet TAKE 1 TO PREVENT HERPES AND TAKE 5 TABLETS PER DAY FOR ACTIVE HERPES  . amLODipine (NORVASC) 5 MG tablet TAKE 1 TABLET MOUTH DAILY TO CONTROL BLOOD PRESSURE  . atorvastatin (LIPITOR) 20 MG tablet TAKE 1 TABLET BY MOUTH EVERY DAY  . febuxostat (ULORIC) 40 MG tablet Take 1 tablet (40 mg total) by mouth daily.  Marland Kitchen losartan (COZAAR) 100 MG tablet TAKE ONE TABLET BY MOUTH ONCE DAILY TO CONTROL BLOOD PRESSURE  . Multiple Vitamins-Minerals (MULTIVITAMIN WITH MINERALS) tablet Take 1 tablet by mouth daily.  . tadalafil (CIALIS) 20 MG tablet Take 1 tablet (20 mg total) by mouth daily as needed for erectile dysfunction.  Marland Kitchen zolpidem (AMBIEN) 5 MG tablet Take one tablet by mouth at bedtime as needed for sleep   No facility-administered encounter medications on file as of 09/15/2019.     Activities of Daily Living In your present state of health, do you have any difficulty performing the following activities: 09/15/2019  Hearing? N  Vision? N  Difficulty concentrating or making decisions? N  Walking or climbing stairs? Y  Comment due to knee pain  Dressing or bathing? N  Doing errands, shopping? N  Preparing Food and eating ? N  Using the Toilet? N  In the past six months, have you accidently leaked urine? N  Do you have problems with loss of bowel control? N  Managing your Medications? N  Managing your Finances? N  Housekeeping or managing your Housekeeping? N  Some recent data might be hidden    Patient Care Team: [name], NP as PCP - General (Geriatric Medicine)    Assessment:   This is a routine wellness examination for Rodney Foster.  Exercise Activities and Dietary recommendations Current Exercise Habits: Structured exercise class, Type of exercise: calisthenics;strength training/weights, Time (Minutes): 35, Frequency (Times/Week): 2, Weekly Exercise (Minutes/Week): 70, Intensity: Mild, Exercise limited by: orthopedic condition(s)  Goals    . Exercise 3x per week (30 min per time)     Patient  will go back to the gym    . Weight (lb) < 200 lb (90.7 kg)     With increase in exercise and eating better.        Fall Risk Fall Risk  09/15/2019 09/15/2019 01/19/2019 10/23/2018 09/05/2018  Falls in the past year? 0 0 0 No No  Number falls in past yr: - - 0 - -  Comment - - - - -  Injury with Fall? - - 0 - -  Comment - - - - -   Is the patient's home free of loose throw rugs in walkways, pet beds, electrical cords, etc?   yes      Grab bars in the bathroom? yes      Handrails on the stairs?   yes      Adequate lighting?   yes  Timed Get Up and Go Performed: na  Depression Screen PHQ 2/9 Scores 09/15/2019 09/15/2019 09/05/2018 06/02/2018  PHQ - 2 Score 0 0 0 0    Cognitive Function MMSE - Mini Mental State Exam 09/05/2018 08/26/2017 10/10/2016  Orientation to time 5 5 5   Orientation to Place 5 5 5   Registration 3 3 3   Attention/ Calculation 5 5 5   Recall 2 2 3   Language- name 2 objects 2 2 2   Language- repeat 1 1 1   Language- follow 3 step command 3 2 3   Language- read & follow direction 1 1 1   Write a sentence 1 1 1   Copy design 1 1 1   Total score 29 28 30      6CIT Screen 09/15/2019  What Year? 0 points  What month? 0 points  What time? 0 points  Count back from 20 0 points  Months in reverse 0 points  Repeat phrase 0 points  Total Score 0    Immunization History  Administered Date(s) Administered  . Fluad Quad(high Dose 65+) 08/20/2019  . Influenza, High Dose Seasonal PF 10/17/2017, 09/05/2018  . Influenza,inj,Quad PF,6+ Mos 12/02/2013,  03/07/2016, 10/10/2016  . Influenza-Unspecified 12/05/2011  . Pneumococcal Conjugate-13 08/26/2017  . Pneumococcal Polysaccharide-23 09/05/2018  . Td 12/31/1994, 12/03/2013  . Zoster Recombinat (Shingrix) 04/15/2018, 08/04/2018    Qualifies for Shingles Vaccine? done  Screening Tests Health Maintenance  Topic Date Due  . COLONOSCOPY  11/18/2022  . TETANUS/TDAP  12/04/2023  . INFLUENZA VACCINE  Completed  . Hepatitis C Screening  Completed  . PNA vac Low Risk Adult  Completed   Cancer Screenings: Lung: Low Dose CT Chest recommended if Age 41-80 years, 30 pack-year currently smoking OR have quit w/in 15years. Patient does not qualify. Colorectal: up to date  Additional Screenings:  Hepatitis C Screening: completed       Plan:     I have personally reviewed and noted the following in the patient's chart:   . Medical and social history . Use of alcohol, tobacco or illicit drugs  . Current medications and supplements . Functional ability and status . Nutritional status . Physical activity . Advanced directives . List of other physicians . Hospitalizations, surgeries, and ER visits in previous 12 months . Vitals . Screenings to include cognitive, depression, and falls . Referrals and appointments  In addition, I have reviewed and discussed with patient certain preventive protocols, quality metrics, and best practice recommendations. A written personalized care plan for preventive services as well as general preventive health recommendations were provided to patient.     [name], NP  09/15/2019

## 2019-09-15 NOTE — Progress Notes (Signed)
    This service is provided via telemedicine  No vital signs collected/recorded due to the encounter was a telemedicine visit.   Location of patient (ex: home, work):  Home.  Patient consents to a telephone visit:  Yes.  Location of the provider (ex: office, home):  Office.  Name of any referring provider:  Sherrie Mustache, NP  Names of all persons participating in the telemedicine service and their role in the encounter:  Sherrie Mustache, NP; Bonney Leitz, [place]; Debera Lat  Time spent on call:  9:49 minutes.  This time is CMA spent on the call.

## 2019-09-15 NOTE — Patient Instructions (Signed)
Rodney Foster , Thank you for taking time to come for your Medicare Wellness Visit. I appreciate your ongoing commitment to your health goals. Please review the following plan we discussed and let me know if I can assist you in the future.   Screening recommendations/referrals: Colonoscopy up to date Recommended yearly ophthalmology/optometry visit for glaucoma screening and checkup Recommended yearly dental visit for hygiene and checkup  Vaccinations: Influenza vaccine- up to date Pneumococcal vaccine up to date Tdap vaccine up to date Shingles vaccine up to date    Advanced directives: to complete and bring back to office to have on file.   Conditions/risks identified: obesity, weight loss encouraged through exercise and diet modifications.   Next appointment: 1 year for awv  Preventive Care 71 Years and Older, Male Preventive care refers to lifestyle choices and visits with your health care provider that can promote health and wellness. What does preventive care include?  A yearly physical exam. This is also called an annual well check.  Dental exams once or twice a year.  Routine eye exams. Ask your health care provider how often you should have your eyes checked.  Personal lifestyle choices, including:  Daily care of your teeth and gums.  Regular physical activity.  Eating a healthy diet.  Avoiding tobacco and drug use.  Limiting alcohol use.  Practicing safe sex.  Taking low doses of aspirin every day.  Taking vitamin and mineral supplements as recommended by your health care provider. What happens during an annual well check? The services and screenings done by your health care provider during your annual well check will depend on your age, overall health, lifestyle risk factors, and family history of disease. Counseling  Your health care provider may ask you questions about your:  Alcohol use.  Tobacco use.  Drug use.  Emotional well-being.  Home and  relationship well-being.  Sexual activity.  Eating habits.  History of falls.  Memory and ability to understand (cognition).  Work and work Statistician. Screening  You may have the following tests or measurements:  Height, weight, and BMI.  Blood pressure.  Lipid and cholesterol levels. These may be checked every 5 years, or more frequently if you are over 42 years old.  Skin check.  Lung cancer screening. You may have this screening every year starting at age 71 if you have a 30-pack-year history of smoking and currently smoke or have quit within the past 15 years.  Fecal occult blood test (FOBT) of the stool. You may have this test every year starting at age 71.  Flexible sigmoidoscopy or colonoscopy. You may have a sigmoidoscopy every 5 years or a colonoscopy every 10 years starting at age 71.  Prostate cancer screening. Recommendations will vary depending on your family history and other risks.  Hepatitis C blood test.  Hepatitis B blood test.  Sexually transmitted disease (STD) testing.  Diabetes screening. This is done by checking your blood sugar (glucose) after you have not eaten for a while (fasting). You may have this done every 1-3 years.  Abdominal aortic aneurysm (AAA) screening. You may need this if you are a current or former smoker.  Osteoporosis. You may be screened starting at age 17 if you are at high risk. Talk with your health care provider about your test results, treatment options, and if necessary, the need for more tests. Vaccines  Your health care provider may recommend certain vaccines, such as:  Influenza vaccine. This is recommended every year.  Tetanus, diphtheria,  and acellular pertussis (Tdap, Td) vaccine. You may need a Td booster every 10 years.  Zoster vaccine. You may need this after age 80.  Pneumococcal 13-valent conjugate (PCV13) vaccine. One dose is recommended after age 71.  Pneumococcal polysaccharide (PPSV23) vaccine. One  dose is recommended after age 71. Talk to your health care provider about which screenings and vaccines you need and how often you need them. This information is not intended to replace advice given to you by your health care provider. Make sure you discuss any questions you have with your health care provider. Document Released: 01/13/2016 Document Revised: 09/05/2016 Document Reviewed: 10/18/2015 Elsevier Interactive Patient Education  2017 [place] Prevention in the Home Falls can cause injuries. They can happen to people of all ages. There are many things you can do to make your home safe and to help prevent falls. What can I do on the outside of my home?  Regularly fix the edges of walkways and driveways and fix any cracks.  Remove anything that might make you trip as you walk through a door, such as a raised step or threshold.  Trim any bushes or trees on the path to your home.  Use bright outdoor lighting.  Clear any walking paths of anything that might make someone trip, such as rocks or tools.  Regularly check to see if handrails are loose or broken. Make sure that both sides of any steps have handrails.  Any raised decks and porches should have guardrails on the edges.  Have any leaves, snow, or ice cleared regularly.  Use sand or salt on walking paths during winter.  Clean up any spills in your garage right away. This includes oil or grease spills. What can I do in the bathroom?  Use night lights.  Install grab bars by the toilet and in the tub and shower. Do not use towel bars as grab bars.  Use non-skid mats or decals in the tub or shower.  If you need to sit down in the shower, use a plastic, non-slip stool.  Keep the floor dry. Clean up any water that spills on the floor as soon as it happens.  Remove soap buildup in the tub or shower regularly.  Attach bath mats securely with double-sided non-slip rug tape.  Do not have throw rugs and other  things on the floor that can make you trip. What can I do in the bedroom?  Use night lights.  Make sure that you have a light by your bed that is easy to reach.  Do not use any sheets or blankets that are too big for your bed. They should not hang down onto the floor.  Have a firm chair that has side arms. You can use this for support while you get dressed.  Do not have throw rugs and other things on the floor that can make you trip. What can I do in the kitchen?  Clean up any spills right away.  Avoid walking on wet floors.  Keep items that you use a lot in easy-to-reach places.  If you need to reach something above you, use a strong step stool that has a grab bar.  Keep electrical cords out of the way.  Do not use floor polish or wax that makes floors slippery. If you must use wax, use non-skid floor wax.  Do not have throw rugs and other things on the floor that can make you trip. What can I do with my  stairs?  Do not leave any items on the stairs.  Make sure that there are handrails on both sides of the stairs and use them. Fix handrails that are broken or loose. Make sure that handrails are as long as the stairways.  Check any carpeting to make sure that it is firmly attached to the stairs. Fix any carpet that is loose or worn.  Avoid having throw rugs at the top or bottom of the stairs. If you do have throw rugs, attach them to the floor with carpet tape.  Make sure that you have a light switch at the top of the stairs and the bottom of the stairs. If you do not have them, ask someone to add them for you. What else can I do to help prevent falls?  Wear shoes that:  Do not have high heels.  Have rubber bottoms.  Are comfortable and fit you well.  Are closed at the toe. Do not wear sandals.  If you use a stepladder:  Make sure that it is fully opened. Do not climb a closed stepladder.  Make sure that both sides of the stepladder are locked into place.  Ask  someone to hold it for you, if possible.  Clearly mark and make sure that you can see:  Any grab bars or handrails.  First and last steps.  Where the edge of each step is.  Use tools that help you move around (mobility aids) if they are needed. These include:  Canes.  Walkers.  Scooters.  Crutches.  Turn on the lights when you go into a dark area. Replace any light bulbs as soon as they burn out.  Set up your furniture so you have a clear path. Avoid moving your furniture around.  If any of your floors are uneven, fix them.  If there are any pets around you, be aware of where they are.  Review your medicines with your doctor. Some medicines can make you feel dizzy. This can increase your chance of falling. Ask your doctor what other things that you can do to help prevent falls. This information is not intended to replace advice given to you by your health care provider. Make sure you discuss any questions you have with your health care provider. Document Released: 10/13/2009 Document Revised: 05/24/2016 Document Reviewed: 01/21/2015 Elsevier Interactive Patient Education  2017 [employer].

## 2019-09-16 ENCOUNTER — Encounter: Payer: Medicare HMO | Admitting: Nurse Practitioner

## 2019-09-17 ENCOUNTER — Encounter (HOSPITAL_COMMUNITY)
Admission: RE | Admit: 2019-09-17 | Discharge: 2019-09-17 | Disposition: A | Discharge status: Home / Self Care | Payer: Medicare HMO | Source: Ambulatory Visit | Attending: Orthopedic Surgery | Admitting: Orthopedic Surgery

## 2019-09-17 ENCOUNTER — Other Ambulatory Visit: Payer: Self-pay

## 2019-09-17 ENCOUNTER — Encounter (HOSPITAL_COMMUNITY): Payer: Self-pay

## 2019-09-17 DIAGNOSIS — M1711 Unilateral primary osteoarthritis, right knee: Secondary | ICD-10-CM | POA: Diagnosis not present

## 2019-09-17 DIAGNOSIS — Z01812 Encounter for preprocedural laboratory examination: Secondary | ICD-10-CM | POA: Diagnosis not present

## 2019-09-17 LAB — CBC WITH DIFFERENTIAL/PLATELET
Abs Immature Granulocytes: 0.02 10*3/uL (ref 0.00–0.07)
Basophils Absolute: 0.1 10*3/uL (ref 0.0–0.1)
Basophils Relative: 1 %
Eosinophils Absolute: 0.1 10*3/uL (ref 0.0–0.5)
Eosinophils Relative: 1 %
HCT: 45.6 % (ref 39.0–52.0)
Hemoglobin: 15.4 g/dL (ref 13.0–17.0)
Immature Granulocytes: 0 %
Lymphocytes Relative: 46 %
Lymphs Abs: 4.1 10*3/uL — ABNORMAL HIGH (ref 0.7–4.0)
MCH: 33.2 pg (ref 26.0–34.0)
MCHC: 33.8 g/dL (ref 30.0–36.0)
MCV: 98.3 fL (ref 80.0–100.0)
Monocytes Absolute: 1.2 10*3/uL — ABNORMAL HIGH (ref 0.1–1.0)
Monocytes Relative: 13 %
Neutro Abs: 3.5 10*3/uL (ref 1.7–7.7)
Neutrophils Relative %: 39 %
Platelets: 231 10*3/uL (ref 150–400)
RBC: 4.64 MIL/uL (ref 4.22–5.81)
RDW: 12 % (ref 11.5–15.5)
WBC: 8.9 10*3/uL (ref 4.0–10.5)
nRBC: 0 % (ref 0.0–0.2)

## 2019-09-17 LAB — COMPREHENSIVE METABOLIC PANEL
ALT: 69 U/L — ABNORMAL HIGH (ref 0–44)
AST: 56 U/L — ABNORMAL HIGH (ref 15–41)
Albumin: 4.5 g/dL (ref 3.5–5.0)
Alkaline Phosphatase: 97 U/L (ref 38–126)
Anion gap: 8 (ref 5–15)
BUN: 12 mg/dL (ref 8–23)
CO2: 25 mmol/L (ref 22–32)
Calcium: 9.6 mg/dL (ref 8.9–10.3)
Chloride: 105 mmol/L (ref 98–111)
Creatinine, Ser: 0.81 mg/dL (ref 0.61–1.24)
GFR calc Af Amer: >60 mL/min (ref >60)
GFR calc non Af Amer: >60 mL/min (ref >60)
Glucose, Bld: 110 mg/dL — ABNORMAL HIGH (ref 70–99)
Potassium: 4.5 mmol/L (ref 3.5–5.1)
Sodium: 138 mmol/L (ref 135–145)
Total Bilirubin: 0.9 mg/dL (ref 0.3–1.2)
Total Protein: 7.5 g/dL (ref 6.5–8.1)

## 2019-09-17 LAB — SURGICAL PCR SCREEN
MRSA, PCR: NEGATIVE
Staphylococcus aureus: NEGATIVE

## 2019-09-17 LAB — APTT: aPTT: 25 seconds (ref 24–36)

## 2019-09-17 LAB — PROTIME-INR
INR: 1.1 (ref 0.8–1.2)
Prothrombin Time: 13.6 seconds (ref 11.4–15.2)

## 2019-09-17 LAB — ABO/RH: ABO/RH(D): A POS

## 2019-09-19 DIAGNOSIS — G4730 Sleep apnea, unspecified: Secondary | ICD-10-CM | POA: Diagnosis not present

## 2019-09-22 ENCOUNTER — Other Ambulatory Visit (HOSPITAL_COMMUNITY)
Admission: RE | Admit: 2019-09-22 | Discharge: 2019-09-22 | Disposition: A | Discharge status: Home / Self Care | Payer: Medicare HMO | Source: Ambulatory Visit | Attending: Orthopedic Surgery | Admitting: Orthopedic Surgery

## 2019-09-22 DIAGNOSIS — G4733 Obstructive sleep apnea (adult) (pediatric): Secondary | ICD-10-CM | POA: Diagnosis not present

## 2019-09-22 DIAGNOSIS — Z20828 Contact with and (suspected) exposure to other viral communicable diseases: Secondary | ICD-10-CM | POA: Diagnosis not present

## 2019-09-22 DIAGNOSIS — Z01812 Encounter for preprocedural laboratory examination: Secondary | ICD-10-CM | POA: Diagnosis not present

## 2019-09-22 DIAGNOSIS — G4730 Sleep apnea, unspecified: Secondary | ICD-10-CM | POA: Diagnosis not present

## 2019-09-23 ENCOUNTER — Other Ambulatory Visit: Payer: Self-pay | Admitting: *Deleted

## 2019-09-23 DIAGNOSIS — G4700 Insomnia, unspecified: Secondary | ICD-10-CM

## 2019-09-23 LAB — NOVEL CORONAVIRUS, NAA (HOSP ORDER, SEND-OUT TO REF LAB; TAT 18-24 HRS): SARS-CoV-2, NAA: NOT DETECTED

## 2019-09-23 MED ORDER — ZOLPIDEM TARTRATE 5 MG PO TABS: ORAL_TABLET | ORAL | 0 refills | Status: DC

## 2019-09-23 NOTE — Telephone Encounter (Signed)
Patient requested refill

## 2019-09-24 MED ORDER — BUPIVACAINE LIPOSOME 1.3 % IJ SUSP
20.0000 mL | INTRAMUSCULAR | Status: DC
  Filled 2019-09-24: qty 20

## 2019-09-24 MED ORDER — TRANEXAMIC ACID 1000 MG/10ML IV SOLN
2000.0000 mg | INTRAVENOUS | Status: DC
  Filled 2019-09-24: qty 20

## 2019-09-24 NOTE — Anesthesia Preprocedure Evaluation (Addendum)
Anesthesia Evaluation  Patient identified by MRN, date of birth, ID band Patient awake    Reviewed: Allergy & Precautions, NPO status , Patient's Chart, lab work & pertinent test results  History of Anesthesia Complications Negative for: history of anesthetic complications  Airway Mallampati: II  TM Distance: >3 FB Neck ROM: Full    Dental no notable dental hx. (+) Dental Advisory Given   Pulmonary neg pulmonary ROS, former smoker,    Pulmonary exam normal        Cardiovascular hypertension, Pt. on medications Normal cardiovascular exam     Neuro/Psych negative neurological ROS     GI/Hepatic negative GI ROS, Neg liver ROS,   Endo/Other  negative endocrine ROS  Renal/GU negative Renal ROS     Musculoskeletal negative musculoskeletal ROS (+)   Abdominal   Peds  Hematology negative hematology ROS (+)   Anesthesia Other Findings Day of surgery medications reviewed with the patient.  Reproductive/Obstetrics                            Anesthesia Physical Anesthesia Plan  ASA: II  Anesthesia Plan: Spinal   Post-op Pain Management:  Regional for Post-op pain   Induction:   PONV Risk Score and Plan: 2 and Ondansetron and Propofol infusion  Airway Management Planned: Natural Airway  Additional Equipment:   Intra-op Plan:   Post-operative Plan: Extubation in OR  Informed Consent: I have reviewed the patients History and Physical, chart, labs and discussed the procedure including the risks, benefits and alternatives for the proposed anesthesia with the patient or authorized representative who has indicated his/her understanding and acceptance.     Dental advisory given  Plan Discussed with: CRNA and Anesthesiologist  Anesthesia Plan Comments:        Anesthesia Quick Evaluation

## 2019-09-25 ENCOUNTER — Ambulatory Visit (HOSPITAL_COMMUNITY): Payer: Medicare HMO | Admitting: Registered Nurse

## 2019-09-25 ENCOUNTER — Observation Stay (HOSPITAL_COMMUNITY)
Admission: AD | Admit: 2019-09-25 | Discharge: 2019-09-26 | Disposition: A | Discharge status: Home / Self Care | Payer: Medicare HMO | Attending: Orthopedic Surgery | Admitting: Orthopedic Surgery

## 2019-09-25 ENCOUNTER — Encounter (HOSPITAL_COMMUNITY)
Admission: AD | Disposition: A | Discharge status: Home / Self Care | Payer: Self-pay | Source: Home / Self Care | Attending: Orthopedic Surgery

## 2019-09-25 ENCOUNTER — Other Ambulatory Visit: Payer: Self-pay

## 2019-09-25 ENCOUNTER — Ambulatory Visit (HOSPITAL_COMMUNITY): Payer: Medicare HMO | Admitting: Physician Assistant

## 2019-09-25 ENCOUNTER — Encounter (HOSPITAL_COMMUNITY): Payer: Self-pay | Admitting: *Deleted

## 2019-09-25 DIAGNOSIS — Z79899 Other long term (current) drug therapy: Secondary | ICD-10-CM | POA: Diagnosis not present

## 2019-09-25 DIAGNOSIS — E669 Obesity, unspecified: Secondary | ICD-10-CM | POA: Insufficient documentation

## 2019-09-25 DIAGNOSIS — M21161 Varus deformity, not elsewhere classified, right knee: Secondary | ICD-10-CM | POA: Diagnosis not present

## 2019-09-25 DIAGNOSIS — G4700 Insomnia, unspecified: Secondary | ICD-10-CM | POA: Insufficient documentation

## 2019-09-25 DIAGNOSIS — M24561 Contracture, right knee: Secondary | ICD-10-CM | POA: Diagnosis not present

## 2019-09-25 DIAGNOSIS — E785 Hyperlipidemia, unspecified: Secondary | ICD-10-CM | POA: Insufficient documentation

## 2019-09-25 DIAGNOSIS — M1711 Unilateral primary osteoarthritis, right knee: Secondary | ICD-10-CM | POA: Diagnosis not present

## 2019-09-25 DIAGNOSIS — B009 Herpesviral infection, unspecified: Secondary | ICD-10-CM | POA: Insufficient documentation

## 2019-09-25 DIAGNOSIS — Z87891 Personal history of nicotine dependence: Secondary | ICD-10-CM | POA: Diagnosis not present

## 2019-09-25 DIAGNOSIS — Z6830 Body mass index (BMI) 30.0-30.9, adult: Secondary | ICD-10-CM | POA: Insufficient documentation

## 2019-09-25 DIAGNOSIS — I10 Essential (primary) hypertension: Secondary | ICD-10-CM | POA: Insufficient documentation

## 2019-09-25 DIAGNOSIS — M109 Gout, unspecified: Secondary | ICD-10-CM | POA: Insufficient documentation

## 2019-09-25 DIAGNOSIS — G8918 Other acute postprocedural pain: Secondary | ICD-10-CM | POA: Diagnosis not present

## 2019-09-25 DIAGNOSIS — N529 Male erectile dysfunction, unspecified: Secondary | ICD-10-CM | POA: Diagnosis not present

## 2019-09-25 HISTORY — PX: TOTAL KNEE ARTHROPLASTY: SHX125

## 2019-09-25 LAB — TYPE AND SCREEN
ABO/RH(D): A POS
Antibody Screen: NEGATIVE

## 2019-09-25 SURGERY — ARTHROPLASTY, KNEE, TOTAL
Anesthesia: Spinal | Site: Knee | Laterality: Right

## 2019-09-25 MED ORDER — MIDAZOLAM HCL 5 MG/5ML IJ SOLN
INTRAMUSCULAR | Status: DC | PRN
  Administered 2019-09-25 (×2): 1 mg via INTRAVENOUS

## 2019-09-25 MED ORDER — PHENOL 1.4 % MT LIQD: 1.0000 | OROMUCOSAL | Status: DC | PRN

## 2019-09-25 MED ORDER — ONDANSETRON HCL 4 MG/2ML IJ SOLN: 4.0000 mg | Freq: Four times a day (QID) | INTRAMUSCULAR | Status: DC | PRN

## 2019-09-25 MED ORDER — FENTANYL CITRATE (PF) 100 MCG/2ML IJ SOLN
INTRAMUSCULAR | Status: AC
  Filled 2019-09-25: qty 2

## 2019-09-25 MED ORDER — FENTANYL CITRATE (PF) 100 MCG/2ML IJ SOLN
INTRAMUSCULAR | Status: DC | PRN
  Administered 2019-09-25 (×2): 50 ug via INTRAVENOUS

## 2019-09-25 MED ORDER — ACETAMINOPHEN 500 MG PO TABS
1000.0000 mg | ORAL_TABLET | Freq: Once | ORAL | Status: AC
  Administered 2019-09-25: 06:00:00 1000 mg via ORAL

## 2019-09-25 MED ORDER — HYDROMORPHONE HCL 1 MG/ML IJ SOLN: 0.5000 mg | INTRAMUSCULAR | Status: DC | PRN

## 2019-09-25 MED ORDER — BUPIVACAINE IN DEXTROSE 0.75-8.25 % IT SOLN
INTRATHECAL | Status: DC | PRN
  Administered 2019-09-25: 2 mL via INTRATHECAL

## 2019-09-25 MED ORDER — LIDOCAINE 2% (20 MG/ML) 5 ML SYRINGE
INTRAMUSCULAR | Status: DC | PRN
  Administered 2019-09-25: 60 mg via INTRAVENOUS

## 2019-09-25 MED ORDER — POVIDONE-IODINE 10 % EX SWAB
2.0000 "application " | Freq: Once | CUTANEOUS | Status: AC
  Administered 2019-09-25: 2 via TOPICAL

## 2019-09-25 MED ORDER — ROPIVACAINE HCL 5 MG/ML IJ SOLN
INTRAMUSCULAR | Status: DC | PRN
  Administered 2019-09-25: 30 mL via PERINEURAL

## 2019-09-25 MED ORDER — MIDAZOLAM HCL 2 MG/2ML IJ SOLN
INTRAMUSCULAR | Status: AC
  Filled 2019-09-25: qty 2

## 2019-09-25 MED ORDER — ATORVASTATIN CALCIUM 20 MG PO TABS
20.0000 mg | ORAL_TABLET | Freq: Every day | ORAL | Status: DC
  Administered 2019-09-25: 22:00:00 20 mg via ORAL
  Filled 2019-09-25: qty 1

## 2019-09-25 MED ORDER — ACYCLOVIR 400 MG PO TABS
400.0000 mg | ORAL_TABLET | Freq: Every day | ORAL | Status: DC
  Administered 2019-09-25 – 2019-09-26 (×2): 400 mg via ORAL
  Filled 2019-09-25 (×2): qty 1

## 2019-09-25 MED ORDER — METOCLOPRAMIDE HCL 5 MG PO TABS: 5.0000 mg | ORAL_TABLET | Freq: Three times a day (TID) | ORAL | Status: DC | PRN

## 2019-09-25 MED ORDER — MENTHOL 3 MG MT LOZG: 1.0000 | LOZENGE | OROMUCOSAL | Status: DC | PRN

## 2019-09-25 MED ORDER — PROMETHAZINE HCL 25 MG/ML IJ SOLN: 6.2500 mg | INTRAMUSCULAR | Status: DC | PRN

## 2019-09-25 MED ORDER — ONDANSETRON HCL 4 MG PO TABS: 4.0000 mg | ORAL_TABLET | Freq: Four times a day (QID) | ORAL | Status: DC | PRN

## 2019-09-25 MED ORDER — TRANEXAMIC ACID-NACL 1000-0.7 MG/100ML-% IV SOLN
INTRAVENOUS | Status: AC
  Filled 2019-09-25: qty 100

## 2019-09-25 MED ORDER — SODIUM CHLORIDE 0.9 % IV SOLN
INTRAVENOUS | Status: DC
  Administered 2019-09-25: 13:00:00 75 mL/h via INTRAVENOUS

## 2019-09-25 MED ORDER — OXYCODONE HCL 5 MG PO TABS: ORAL_TABLET | ORAL | 0 refills | Status: DC

## 2019-09-25 MED ORDER — DEXAMETHASONE SODIUM PHOSPHATE 10 MG/ML IJ SOLN
INTRAMUSCULAR | Status: AC
  Filled 2019-09-25: qty 1

## 2019-09-25 MED ORDER — LIDOCAINE 2% (20 MG/ML) 5 ML SYRINGE
INTRAMUSCULAR | Status: AC
  Filled 2019-09-25: qty 5

## 2019-09-25 MED ORDER — PANTOPRAZOLE SODIUM 40 MG PO TBEC
40.0000 mg | DELAYED_RELEASE_TABLET | Freq: Every day | ORAL | Status: DC
  Administered 2019-09-26: 10:00:00 40 mg via ORAL
  Filled 2019-09-25: qty 1

## 2019-09-25 MED ORDER — SODIUM CHLORIDE (PF) 0.9 % IJ SOLN
INTRAMUSCULAR | Status: AC
  Filled 2019-09-25: qty 50

## 2019-09-25 MED ORDER — CHLORHEXIDINE GLUCONATE 4 % EX LIQD: 60.0000 mL | Freq: Once | CUTANEOUS | Status: DC

## 2019-09-25 MED ORDER — BUPIVACAINE LIPOSOME 1.3 % IJ SUSP
INTRAMUSCULAR | Status: DC | PRN
  Administered 2019-09-25: 20 mL

## 2019-09-25 MED ORDER — TRANEXAMIC ACID-NACL 1000-0.7 MG/100ML-% IV SOLN
1000.0000 mg | Freq: Once | INTRAVENOUS | Status: AC
  Administered 2019-09-25: 14:00:00 1000 mg via INTRAVENOUS
  Filled 2019-09-25: qty 100

## 2019-09-25 MED ORDER — ACETAMINOPHEN 500 MG PO TABS
ORAL_TABLET | ORAL | Status: AC
  Administered 2019-09-25: 1000 mg via ORAL
  Filled 2019-09-25: qty 2

## 2019-09-25 MED ORDER — SODIUM CHLORIDE 0.9 % IR SOLN
Status: DC | PRN
  Administered 2019-09-25 (×2): 1000 mL

## 2019-09-25 MED ORDER — CEFAZOLIN SODIUM-DEXTROSE 2-4 GM/100ML-% IV SOLN
2.0000 g | Freq: Four times a day (QID) | INTRAVENOUS | Status: AC
  Administered 2019-09-25 (×2): 2 g via INTRAVENOUS
  Filled 2019-09-25 (×2): qty 100

## 2019-09-25 MED ORDER — BUPIVACAINE-EPINEPHRINE (PF) 0.5% -1:200000 IJ SOLN
INTRAMUSCULAR | Status: DC | PRN
  Administered 2019-09-25: 30 mL

## 2019-09-25 MED ORDER — CELECOXIB 200 MG PO CAPS
ORAL_CAPSULE | ORAL | Status: AC
  Administered 2019-09-25: 400 mg via ORAL
  Filled 2019-09-25: qty 1

## 2019-09-25 MED ORDER — SODIUM CHLORIDE 0.9% FLUSH
INTRAVENOUS | Status: DC | PRN
  Administered 2019-09-25: 50 mL

## 2019-09-25 MED ORDER — ACETAMINOPHEN 325 MG PO TABS: 325.0000 mg | ORAL_TABLET | Freq: Four times a day (QID) | ORAL | Status: DC | PRN

## 2019-09-25 MED ORDER — PROPOFOL 10 MG/ML IV BOLUS
INTRAVENOUS | Status: DC | PRN
  Administered 2019-09-25 (×3): 20 mg via INTRAVENOUS

## 2019-09-25 MED ORDER — PROPOFOL 500 MG/50ML IV EMUL
INTRAVENOUS | Status: DC | PRN
  Administered 2019-09-25: 50 ug/kg/min via INTRAVENOUS

## 2019-09-25 MED ORDER — TRANEXAMIC ACID-NACL 1000-0.7 MG/100ML-% IV SOLN
1000.0000 mg | INTRAVENOUS | Status: AC
  Administered 2019-09-25: 1000 mg via INTRAVENOUS

## 2019-09-25 MED ORDER — FEBUXOSTAT 40 MG PO TABS
40.0000 mg | ORAL_TABLET | Freq: Every day | ORAL | Status: DC
  Administered 2019-09-25 – 2019-09-26 (×2): 40 mg via ORAL
  Filled 2019-09-25 (×2): qty 1

## 2019-09-25 MED ORDER — ZOLPIDEM TARTRATE 5 MG PO TABS
5.0000 mg | ORAL_TABLET | Freq: Every evening | ORAL | Status: DC | PRN
  Administered 2019-09-25: 5 mg via ORAL
  Filled 2019-09-25: qty 1

## 2019-09-25 MED ORDER — DEXAMETHASONE SODIUM PHOSPHATE 10 MG/ML IJ SOLN
INTRAMUSCULAR | Status: DC | PRN
  Administered 2019-09-25: 10 mg via INTRAVENOUS

## 2019-09-25 MED ORDER — ACETAMINOPHEN 500 MG PO TABS
1000.0000 mg | ORAL_TABLET | Freq: Four times a day (QID) | ORAL | Status: AC
  Administered 2019-09-25 – 2019-09-26 (×4): 1000 mg via ORAL
  Filled 2019-09-25 (×4): qty 2

## 2019-09-25 MED ORDER — ONDANSETRON HCL 4 MG/2ML IJ SOLN
INTRAMUSCULAR | Status: AC
  Filled 2019-09-25: qty 2

## 2019-09-25 MED ORDER — LOSARTAN POTASSIUM 50 MG PO TABS
100.0000 mg | ORAL_TABLET | Freq: Every day | ORAL | Status: DC
  Administered 2019-09-25 – 2019-09-26 (×2): 100 mg via ORAL
  Filled 2019-09-25 (×2): qty 2

## 2019-09-25 MED ORDER — DOCUSATE SODIUM 100 MG PO CAPS
100.0000 mg | ORAL_CAPSULE | Freq: Two times a day (BID) | ORAL | Status: DC
  Administered 2019-09-25 – 2019-09-26 (×2): 100 mg via ORAL
  Filled 2019-09-25: qty 1

## 2019-09-25 MED ORDER — GABAPENTIN 300 MG PO CAPS: ORAL_CAPSULE | ORAL | 0 refills | Status: DC

## 2019-09-25 MED ORDER — ONDANSETRON HCL 4 MG/2ML IJ SOLN
INTRAMUSCULAR | Status: DC | PRN
  Administered 2019-09-25: 4 mg via INTRAVENOUS

## 2019-09-25 MED ORDER — DOCUSATE SODIUM 100 MG PO CAPS: 100.0000 mg | ORAL_CAPSULE | Freq: Every day | ORAL | 2 refills | Status: DC | PRN

## 2019-09-25 MED ORDER — AMLODIPINE BESYLATE 5 MG PO TABS
5.0000 mg | ORAL_TABLET | Freq: Every day | ORAL | Status: DC
  Administered 2019-09-26: 5 mg via ORAL
  Filled 2019-09-25: qty 1

## 2019-09-25 MED ORDER — WATER FOR IRRIGATION, STERILE IR SOLN
Status: DC | PRN
  Administered 2019-09-25: 2000 mL

## 2019-09-25 MED ORDER — LACTATED RINGERS IV SOLN
INTRAVENOUS | Status: DC
  Administered 2019-09-25 (×2): via INTRAVENOUS

## 2019-09-25 MED ORDER — BUPIVACAINE-EPINEPHRINE 0.5% -1:200000 IJ SOLN
INTRAMUSCULAR | Status: AC
  Filled 2019-09-25: qty 1

## 2019-09-25 MED ORDER — PROPOFOL 10 MG/ML IV BOLUS
INTRAVENOUS | Status: AC
  Filled 2019-09-25: qty 80

## 2019-09-25 MED ORDER — ACETAMINOPHEN 325 MG PO TABS: 650.0000 mg | ORAL_TABLET | ORAL | 2 refills | Status: DC | PRN

## 2019-09-25 MED ORDER — CELECOXIB 200 MG PO CAPS
400.0000 mg | ORAL_CAPSULE | Freq: Once | ORAL | Status: AC
  Administered 2019-09-25: 06:00:00 400 mg via ORAL

## 2019-09-25 MED ORDER — METOCLOPRAMIDE HCL 5 MG/ML IJ SOLN: 5.0000 mg | Freq: Three times a day (TID) | INTRAMUSCULAR | Status: DC | PRN

## 2019-09-25 MED ORDER — OXYCODONE HCL 5 MG PO TABS
5.0000 mg | ORAL_TABLET | ORAL | Status: DC | PRN
  Administered 2019-09-25: 5 mg via ORAL
  Administered 2019-09-25: 10 mg via ORAL
  Administered 2019-09-25: 14:00:00 5 mg via ORAL
  Administered 2019-09-25: 10 mg via ORAL
  Administered 2019-09-26: 5 mg via ORAL
  Administered 2019-09-26 (×2): 10 mg via ORAL
  Filled 2019-09-25 (×2): qty 2
  Filled 2019-09-25: qty 1
  Filled 2019-09-25 (×3): qty 2
  Filled 2019-09-25: qty 1

## 2019-09-25 MED ORDER — FENTANYL CITRATE (PF) 100 MCG/2ML IJ SOLN: 25.0000 ug | INTRAMUSCULAR | Status: DC | PRN

## 2019-09-25 MED ORDER — ASPIRIN EC 81 MG PO TBEC: 81.0000 mg | DELAYED_RELEASE_TABLET | Freq: Two times a day (BID) | ORAL | 0 refills | Status: AC

## 2019-09-25 MED ORDER — TRANEXAMIC ACID 1000 MG/10ML IV SOLN
INTRAVENOUS | Status: DC | PRN
  Administered 2019-09-25: 2000 mg via TOPICAL

## 2019-09-25 MED ORDER — FLEET ENEMA 7-19 GM/118ML RE ENEM: 1.0000 | ENEMA | Freq: Once | RECTAL | Status: DC | PRN

## 2019-09-25 MED ORDER — ASPIRIN 81 MG PO CHEW
81.0000 mg | CHEWABLE_TABLET | Freq: Two times a day (BID) | ORAL | Status: DC
  Administered 2019-09-25 – 2019-09-26 (×2): 81 mg via ORAL
  Filled 2019-09-25 (×2): qty 1

## 2019-09-25 MED ORDER — CEFAZOLIN SODIUM-DEXTROSE 2-4 GM/100ML-% IV SOLN
2.0000 g | INTRAVENOUS | Status: AC
  Administered 2019-09-25: 08:00:00 2 g via INTRAVENOUS

## 2019-09-25 MED ORDER — SODIUM CHLORIDE 0.9 % IV SOLN: INTRAVENOUS | Status: DC

## 2019-09-25 MED ORDER — BISACODYL 10 MG RE SUPP: 10.0000 mg | Freq: Every day | RECTAL | Status: DC | PRN

## 2019-09-25 MED ORDER — GABAPENTIN 300 MG PO CAPS
300.0000 mg | ORAL_CAPSULE | Freq: Three times a day (TID) | ORAL | Status: DC
  Administered 2019-09-25 – 2019-09-26 (×3): 300 mg via ORAL
  Filled 2019-09-25 (×3): qty 1

## 2019-09-25 MED ORDER — CEFAZOLIN SODIUM-DEXTROSE 2-4 GM/100ML-% IV SOLN
INTRAVENOUS | Status: AC
  Filled 2019-09-25: qty 100

## 2019-09-25 MED ORDER — POLYETHYLENE GLYCOL 3350 17 G PO PACK: 17.0000 g | PACK | Freq: Every day | ORAL | Status: DC | PRN

## 2019-09-25 MED ORDER — DEXAMETHASONE SODIUM PHOSPHATE 10 MG/ML IJ SOLN
INTRAMUSCULAR | Status: DC | PRN
  Administered 2019-09-25: 10 mg

## 2019-09-25 SURGICAL SUPPLY — 65 items
APL SKNCLS STERI-STRIP NONHPOA (GAUZE/BANDAGES/DRESSINGS) ×1
ATTUNE MED DOME PAT 41 KNEE (Knees) ×1 IMPLANT
ATTUNE PS FEM RT SZ 7 CEM KNEE (Femur) ×1 IMPLANT
ATTUNE PSRP INSR SZ7 6 KNEE (Insert) ×1 IMPLANT
BAG DECANTER FOR FLEXI CONT (MISCELLANEOUS) ×2 IMPLANT
BAG SPEC THK2 15X12 ZIP CLS (MISCELLANEOUS) ×1
BAG ZIPLOCK 12X15 (MISCELLANEOUS) ×2 IMPLANT
BASE TIBIAL ROT PLAT SZ 7 KNEE (Knees) IMPLANT
BENZOIN TINCTURE PRP APPL 2/3 (GAUZE/BANDAGES/DRESSINGS) ×2 IMPLANT
BLADE SAGITTAL 25.0X1.19X90 (BLADE) ×2 IMPLANT
BLADE SAW SGTL 13X75X1.27 (BLADE) ×2 IMPLANT
BLADE SURG 15 STRL LF DISP TIS (BLADE) ×1 IMPLANT
BLADE SURG 15 STRL SS (BLADE) ×2
BLADE SURG SZ10 CARB STEEL (BLADE) ×6 IMPLANT
BNDG CMPR MED 15X6 ELC VLCR LF (GAUZE/BANDAGES/DRESSINGS) ×1
BNDG ELASTIC 6X15 VLCR STRL LF (GAUZE/BANDAGES/DRESSINGS) ×2 IMPLANT
BOWL SMART MIX CTS (DISPOSABLE) ×2 IMPLANT
BSPLAT TIB 7 CMNT ROT PLAT STR (Knees) ×1 IMPLANT
CEMENT HV SMART SET (Cement) ×2 IMPLANT
CLSR STERI-STRIP ANTIMIC 1/2X4 (GAUZE/BANDAGES/DRESSINGS) ×4 IMPLANT
COVER SURGICAL LIGHT HANDLE (MISCELLANEOUS) ×2 IMPLANT
COVER WAND RF STERILE (DRAPES) ×1 IMPLANT
CUFF TOURN SGL QUICK 34 (TOURNIQUET CUFF) ×2
CUFF TRNQT CYL 34X4.125X (TOURNIQUET CUFF) ×1 IMPLANT
DECANTER SPIKE VIAL GLASS SM (MISCELLANEOUS) ×4 IMPLANT
DRAPE INCISE IOBAN 66X45 STRL (DRAPES) IMPLANT
DRAPE ORTHO SPLIT 77X108 STRL (DRAPES) ×4
DRAPE SURG ORHT 6 SPLT 77X108 (DRAPES) ×2 IMPLANT
DRAPE U-SHAPE 47X51 STRL (DRAPES) ×2 IMPLANT
DRESSING AQUACEL AG SP 3.5X10 (GAUZE/BANDAGES/DRESSINGS) ×1 IMPLANT
DRSG AQUACEL AG SP 3.5X10 (GAUZE/BANDAGES/DRESSINGS) ×2
DURAPREP 26ML APPLICATOR (WOUND CARE) ×4 IMPLANT
ELECT REM PT RETURN 15FT ADLT (MISCELLANEOUS) ×2 IMPLANT
GLOVE BIOGEL PI IND STRL 8 (GLOVE) ×2 IMPLANT
GLOVE BIOGEL PI INDICATOR 8 (GLOVE) ×2
GLOVE SURG ORTHO 8.0 STRL STRW (GLOVE) ×2 IMPLANT
GLOVE SURG SS PI 7.5 STRL IVOR (GLOVE) ×2 IMPLANT
GOWN STRL REUS W/TWL XL LVL3 (GOWN DISPOSABLE) ×4 IMPLANT
HANDPIECE INTERPULSE COAX TIP (DISPOSABLE) ×2
HOLDER FOLEY CATH W/STRAP (MISCELLANEOUS) ×1 IMPLANT
HOOD PEEL AWAY FLYTE STAYCOOL (MISCELLANEOUS) ×2 IMPLANT
IMMOBILIZER KNEE 20 (SOFTGOODS) ×2
IMMOBILIZER KNEE 20 THIGH 36 (SOFTGOODS) ×1 IMPLANT
KIT TURNOVER KIT A (KITS) IMPLANT
MANIFOLD NEPTUNE II (INSTRUMENTS) ×2 IMPLANT
NEEDLE HYPO 22GX1.5 SAFETY (NEEDLE) ×4 IMPLANT
NS IRRIG 1000ML POUR BTL (IV SOLUTION) ×2 IMPLANT
PACK ICE MAXI GEL EZY WRAP (MISCELLANEOUS) ×2 IMPLANT
PACK TOTAL KNEE CUSTOM (KITS) ×2 IMPLANT
PIN DRILL FIX HALF THREAD (BIT) ×1 IMPLANT
PIN STEINMAN FIXATION KNEE (PIN) ×1 IMPLANT
PROTECTOR NERVE ULNAR (MISCELLANEOUS) ×2 IMPLANT
SET HNDPC FAN SPRY TIP SCT (DISPOSABLE) ×1 IMPLANT
SUT ETHIBOND NAB CT1 #1 30IN (SUTURE) ×6 IMPLANT
SUT MNCRL AB 3-0 PS2 18 (SUTURE) ×2 IMPLANT
SUT VIC AB 0 CT1 36 (SUTURE) ×2 IMPLANT
SUT VIC AB 2-0 CT1 27 (SUTURE) ×4
SUT VIC AB 2-0 CT1 TAPERPNT 27 (SUTURE) ×2 IMPLANT
SYR 10ML ECCENTRIC (SYRINGE) ×1 IMPLANT
SYR CONTROL 10ML LL (SYRINGE) ×5 IMPLANT
TIBIAL BASE ROT PLAT SZ 7 KNEE (Knees) ×2 IMPLANT
TRAY FOLEY MTR SLVR 16FR STAT (SET/KITS/TRAYS/PACK) ×2 IMPLANT
WATER STERILE IRR 1000ML POUR (IV SOLUTION) ×4 IMPLANT
WRAP KNEE MAXI GEL POST OP (GAUZE/BANDAGES/DRESSINGS) ×1 IMPLANT
YANKAUER SUCT BULB TIP 10FT TU (MISCELLANEOUS) ×2 IMPLANT

## 2019-09-25 NOTE — Evaluation (Addendum)
Physical Therapy Evaluation Patient Details Name: Rodney Foster MRN: [id number] DOB: [birth year] Today's Date: 09/25/2019   History of Present Illness  Patient is 69 y.o. male s/p Rt TKA on 09/25/19 with PMH significant for OA, HLD, HTN, and a chronic cough.  Clinical Impression  Rodney Foster is a 69 y.o. male POD 0 s/p Rt TKA. Patient reports independence with mobility at baseline. Patient is now limited by functional impairments (see PT problem list below) and requires up to min assist for transfers and gait with RW. Patient was able to ambulate ~80 feet with RW and required verbal/tactile cues for safe management of RW throughout. Patient instructed in exercise to facilitate ROM and circulation to manage edema. Patient will benefit from continued skilled PT interventions to address impairments and progress towards PLOF. Acute PT will follow to progress mobility and stair training in preparation for safe discharge home.     Follow Up Recommendations Follow surgeon's recommendation for DC plan and follow-up therapies    Equipment Recommendations  None recommended by PT    Recommendations for Other Services       Precautions / Restrictions Precautions Precautions: Fall Restrictions Weight Bearing Restrictions: No      Mobility  Bed Mobility Overal bed mobility: Needs Assistance Bed Mobility: Supine to Sit     Supine to sit: HOB elevated;Supervision     General bed mobility comments: no cues needed for sequencing, pt with extra time and using bed rail and HOB elevated  Transfers Overall transfer level: Needs assistance Equipment used: Rolling walker (2 wheeled) Transfers: Sit to/from Stand Sit to Stand: Min guard         General transfer comment: verbal cues required for sequecning and safe technique with transfer, min guard for safety  Ambulation/Gait Ambulation/Gait assistance: Min assist Gait Distance (Feet): 80 Feet Assistive device: Rolling walker (2  wheeled) Gait Pattern/deviations: Step-through pattern;Step-to pattern;Decreased stance time - right;Decreased step length - right;Decreased weight shift to right Gait velocity: decreased   General Gait Details: initially pt with step to pattern and standign too close to RW; verbal/tactile cues to facilitate improved safety and proximety wtih RW, pt progressed to step through gait pattern with RW wtih cues to push walker like a shopping cart  Stairs            Wheelchair Mobility    Modified Rankin (Stroke Patients Only)       Balance Overall balance assessment: Needs assistance Sitting-balance support: No upper extremity supported;Feet supported Sitting balance-Leahy Scale: Good     Standing balance support: During functional activity;Bilateral upper extremity supported Standing balance-Leahy Scale: Fair Standing balance comment: pt maintained static balance, requires support for gait                Pertinent Vitals/Pain Pain Assessment: 0-10 Faces Pain Scale: Hurts a little bit Pain Location: Rt Knee (pt does not rate, states he can feel his knee more finally) Pain Descriptors / Indicators: Aching;Guarding Pain Intervention(s): Limited activity within patient's tolerance;Monitored during session;Premedicated before session    Home Living Family/patient expects to be discharged to:: Private residence Living Arrangements: Spouse/significant other Available Help at Discharge: Friend(s) Type of Home: House Home Access: Stairs to enter Entrance Stairs-Rails: None Entrance Stairs-Number of Steps: 3 Home Layout: Two level;Able to live on main level with bedroom/bathroom;Full bath on main level Home Equipment: Walker - 2 wheels;Cane - single point;Bedside commode Additional Comments: pt lives with girlfriend, 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  but does lives on main level    Prior Function Level of Independence: Independent         Comments:  pt was ambulating with no device prior to surgery however wa hobbling due to pain     Hand Dominance        Extremity/Trunk Assessment   Upper Extremity Assessment Upper Extremity Assessment: Overall WFL for tasks assessed    Lower Extremity Assessment Lower Extremity Assessment: RLE deficits/detail RLE Deficits / Details: pt with moderate quad activation and mild extensor lag with SLR, no buckling in WB    Cervical / Trunk Assessment Cervical / Trunk Assessment: Normal  Communication   Communication: No difficulties  Cognition Arousal/Alertness: Awake/alert Behavior During Therapy: WFL for tasks assessed/performed Overall Cognitive Status: Within Functional Limits for tasks assessed         General Comments      Exercises Total Joint Exercises Ankle Circles/Pumps: AROM;Both;10 reps;Seated Quad Sets: AROM;Right;5 reps;Supine;Seated(2 sets (1x5 supine, 1x5 seated)) Heel Slides: AROM;Right;5 reps;Seated   Assessment/Plan    PT Assessment Patient needs continued PT services  PT Problem List Decreased strength;Decreased activity tolerance;Decreased mobility;Decreased range of motion;Decreased balance       PT Treatment Interventions DME instruction;Stair training;Therapeutic activities;Balance training;Manual techniques;Patient/family education;Therapeutic exercise;Functional mobility training;Gait training;Modalities    PT Goals (Current goals can be found in the Care Plan section)  Acute Rehab PT Goals Patient Stated Goal: return home and walk without walker PT Goal Formulation: With patient Time For Goal Achievement: 10/02/19 Potential to Achieve Goals: Good    Frequency 7X/week    AM-PAC PT "6 Clicks" Mobility  Outcome Measure Help needed turning from your back to your side while in a flat bed without using bedrails?: A Little Help needed moving from lying on your back to sitting on the side of a flat bed without using bedrails?: A Little Help needed moving  to and from a bed to a chair (including a wheelchair)?: A Little Help needed standing up from a chair using your arms (e.g., wheelchair or bedside chair)?: A Little Help needed to walk in hospital room?: A Little Help needed climbing 3-5 steps with a railing? : A Little 6 Click Score: 18    End of Session Equipment Utilized During Treatment: Gait belt Activity Tolerance: Patient tolerated treatment well Patient left: in chair;with chair alarm set;with call bell/phone within reach Nurse Communication: Mobility status PT Visit Diagnosis: Unsteadiness on feet (R26.81);Other abnormalities of gait and mobility (R26.89);Muscle weakness (generalized) (M62.81);Difficulty in walking, not elsewhere classified (R26.2)    Time: [id number] PT Time Calculation (min) (ACUTE ONLY): 35 min   Charges:   PT Evaluation $PT Eval Low Complexity: 1 Low PT Treatments $Gait Training: 8-22 mins        Kipp Brood, PT, DPT, [facility] Physical Therapist with [facility]  09/25/2019 3:12 PM

## 2019-09-25 NOTE — Interval H&P Note (Signed)
History and Physical Interval Note:  09/25/2019 7:34 AM  Rodney Foster  has presented today for surgery, with the diagnosis of OA RIGHT KNEE.  The various methods of treatment have been discussed with the patient and family. After consideration of risks, benefits and other options for treatment, the patient has consented to  Procedure(s): TOTAL KNEE ARTHROPLASTY (Right) as a surgical intervention.  The patient's history has been reviewed, patient examined, no change in status, stable for surgery.  I have reviewed the patient's chart and labs.  Questions were answered to the patient's satisfaction.     [name]

## 2019-09-25 NOTE — Op Note (Signed)
NAME: Rodney Foster, Rodney Foster MEDICAL RECORD [id number] ACCOUNT [id number] DATE OF BIRTH:[birth year] FACILITY: [facility] LOCATION: [facility] PHYSICIAN:W. Copelyn Widmer JR., MD  OPERATIVE REPORT  DATE OF PROCEDURE:  09/25/2019  PREOPERATIVE DIAGNOSIS:  Osteoarthritis, right knee with varus deformity, flexion contracture.  POSTOPERATIVE DIAGNOSIS:  Osteoarthritis, right knee with varus deformity, flexion contracture.  OPERATION:  Right total knee replacement (Attune DePuy size femur, tibia with 41 mm patella and 6 mm thick bearing).  SURGEON:  Vangie Bicker, MD  ASSISTANTMarjo Bicker  ANESTHESIA:  Spinal with adductor block.  TOURNIQUET TIME:  1 hour 2 minutes.  DESCRIPTION OF PROCEDURE:  Straight skin incision, medial parapatellar approach to the knee made.  We did a 5-degree 9 mm cut on the femur followed by cutting probably about 6 mm below the least-diseased lateral compartment.  We did a medial release due  to the varus deformity.  Extension gap measured at eventually 6 mm.  We sized the femur to be a size 7 femur, placement of all-in-1 cutting block.  Appropriate degree of external rotation, accomplishing anterior and posterior chamfer cuts.  PCL was  released.  Excess menisci removed, as well as osteophytes from the posterior aspect of the knee.  Sized the tibia to be a size 7 with a keel hole cut with the trial baseplate placed, box cut for the femur, and trial femur, tibia with full extension.   Good balancing all the ligaments and resolution of the varus deformity noted.  Patella was cut leaving about 50-60 mm of native patella, placed a 41 mm all poly trial.  All trials were deemed to be acceptable.  Cement was trialed on the back table.  We  inserted the final components after pulsatile lavage.  Tibia followed by femur and patella, using the trial bearing while the cement hardened.  Excess cement was removed from the posterior aspect of the knee.  The tourniquet was released after   inspection.  No excessive bleeding was noted.  Small bleeders were coagulated.  Final bearing was placed, and again all parameters deemed to be acceptable.  Closure was affected on the capsule with #1 Ethibond, 2-0 Vicryl and Monocryl in the skin.  A  lightly compressive sterile dressing and knee immobilizer applied.  Taken to recovery room in stable condition.  LN/NUANCE  D:09/25/2019 T:09/25/2019 JOB:[id number]

## 2019-09-25 NOTE — Anesthesia Procedure Notes (Signed)
Date/Time: 09/25/2019 7:45 AM Performed by: Talbot Grumbling, CRNA Oxygen Delivery Method: Simple face mask

## 2019-09-25 NOTE — Brief Op Note (Signed)
09/25/2019  10:14 AM  PATIENT:  Rodney Foster  69 y.o. male  PRE-OPERATIVE DIAGNOSIS:  OA RIGHT KNEE  POST-OPERATIVE DIAGNOSIS:  OA RIGHT KNEE  PROCEDURE:  Procedure(s): TOTAL KNEE ARTHROPLASTY (Right)  SURGEON:  Surgeon(s) and Role:    Earlie Server, MD - Primary  PHYSICIAN ASSISTANT: Chriss Czar, PA-C  ASSISTANTS: OR staff x1   ANESTHESIA:   local, regional, spinal and IV sedation  EBL:  50 mL   BLOOD ADMINISTERED:none  DRAINS: none   LOCAL MEDICATIONS USED:  MARCAINE     SPECIMEN:  No Specimen  DISPOSITION OF SPECIMEN:  N/A  COUNTS:  YES  TOURNIQUET:   Total Tourniquet Time Documented: Thigh (Right) - 64 minutes Total: Thigh (Right) - 64 minutes   DICTATION: .Other Dictation: Dictation Number unknown  PLAN OF CARE: Admit for overnight observation  PATIENT DISPOSITION:  PACU - hemodynamically stable.   Delay start of Pharmacological VTE agent (>24hrs) due to surgical blood loss or risk of bleeding: yes

## 2019-09-25 NOTE — Anesthesia Postprocedure Evaluation (Signed)
Anesthesia Post Note  Patient: Rodney Foster  Procedure(s) Performed: TOTAL KNEE ARTHROPLASTY (Right Knee)     Patient location during evaluation: PACU Anesthesia Type: Spinal Level of consciousness: awake and alert Pain management: pain level controlled Vital Signs Assessment: post-procedure vital signs reviewed and stable Respiratory status: spontaneous breathing and respiratory function stable Cardiovascular status: blood pressure returned to baseline and stable Postop Assessment: spinal receding Anesthetic complications: no    Last Vitals:  Vitals:   09/25/19 1215 09/25/19 1220  BP: 121/77 124/76  Pulse: 67 70  Resp: 15 15  Temp:  36.5 C  SpO2: 99% 99%    Last Pain:  Vitals:   09/25/19 1220  TempSrc: Oral  PainSc: 0-No pain                 Tarae Wooden DANIEL

## 2019-09-25 NOTE — Discharge Instructions (Signed)

## 2019-09-25 NOTE — Transfer of Care (Signed)
Immediate Anesthesia Transfer of Care Note  Patient: Rodney Foster  Procedure(s) Performed: TOTAL KNEE ARTHROPLASTY (Right Knee)  Patient Location: PACU  Anesthesia Type:Spinal  Level of Consciousness: sedated  Airway & Oxygen Therapy: Patient Spontanous Breathing and Patient connected to face mask oxygen  Post-op Assessment: Report given to RN and Post -op Vital signs reviewed and stable  Post vital signs: Reviewed and stable  Last Vitals:  Vitals Value Taken Time  BP    Temp    Pulse 66 09/25/19 0958  Resp    SpO2 98 % 09/25/19 0958  Vitals shown include unvalidated device data.  Last Pain:  Vitals:   09/25/19 0543  TempSrc: Oral         Complications: No apparent anesthesia complications

## 2019-09-25 NOTE — Anesthesia Procedure Notes (Signed)
Anesthesia Regional Block: Adductor canal block   Pre-Anesthetic Checklist: ,, timeout performed, Correct Patient, Correct Site, Correct Laterality, Correct Procedure, Correct Position, site marked, Risks and benefits discussed,  Surgical consent,  Pre-op evaluation,  At surgeon's request and post-op pain management  Laterality: Right  Prep: chloraprep       Needles:  Injection technique: Single-shot  Needle Type: Stimulator Needle - 80     Needle Length: 10cm  Needle Gauge: 21     Additional Needles:   Narrative:  Start time: 09/25/2019 6:51 AM End time: 09/25/2019 7:01 AM Injection made incrementally with aspirations every 5 mL.  Performed by: Personally

## 2019-09-25 NOTE — Anesthesia Procedure Notes (Signed)
Spinal  Patient location during procedure: OR Start time: 09/25/2019 7:40 AM End time: 09/25/2019 7:44 AM Staffing Anesthesiologist: Duane Boston, MD Resident/CRNA: Talbot Grumbling, CRNA Performed: resident/CRNA  Preanesthetic Checklist Completed: patient identified, surgical consent, pre-op evaluation, timeout performed, IV checked, risks and benefits discussed and monitors and equipment checked Spinal Block Patient position: sitting Prep: DuraPrep Patient monitoring: heart rate, cardiac monitor, continuous pulse ox and blood pressure Approach: midline Location: L3-4 Injection technique: single-shot Needle Needle type: Pencan  Needle gauge: 24 G Needle length: 9 cm Assessment Sensory level: T4 Additional Notes Clear CSF, no paresthesia, patient tolerated well.

## 2019-09-26 DIAGNOSIS — E785 Hyperlipidemia, unspecified: Secondary | ICD-10-CM | POA: Diagnosis not present

## 2019-09-26 DIAGNOSIS — M109 Gout, unspecified: Secondary | ICD-10-CM | POA: Diagnosis not present

## 2019-09-26 DIAGNOSIS — I10 Essential (primary) hypertension: Secondary | ICD-10-CM | POA: Diagnosis not present

## 2019-09-26 DIAGNOSIS — M21161 Varus deformity, not elsewhere classified, right knee: Secondary | ICD-10-CM | POA: Diagnosis not present

## 2019-09-26 DIAGNOSIS — G4700 Insomnia, unspecified: Secondary | ICD-10-CM | POA: Diagnosis not present

## 2019-09-26 DIAGNOSIS — M24561 Contracture, right knee: Secondary | ICD-10-CM | POA: Diagnosis not present

## 2019-09-26 DIAGNOSIS — N529 Male erectile dysfunction, unspecified: Secondary | ICD-10-CM | POA: Diagnosis not present

## 2019-09-26 DIAGNOSIS — M1711 Unilateral primary osteoarthritis, right knee: Secondary | ICD-10-CM | POA: Diagnosis not present

## 2019-09-26 DIAGNOSIS — E669 Obesity, unspecified: Secondary | ICD-10-CM | POA: Diagnosis not present

## 2019-09-26 DIAGNOSIS — Z96651 Presence of right artificial knee joint: Secondary | ICD-10-CM | POA: Diagnosis not present

## 2019-09-26 LAB — CBC
HCT: 38 % — ABNORMAL LOW (ref 39.0–52.0)
Hemoglobin: 13 g/dL (ref 13.0–17.0)
MCH: 33.2 pg (ref 26.0–34.0)
MCHC: 34.2 g/dL (ref 30.0–36.0)
MCV: 97.2 fL (ref 80.0–100.0)
Platelets: 304 10*3/uL (ref 150–400)
RBC: 3.91 MIL/uL — ABNORMAL LOW (ref 4.22–5.81)
RDW: 11.7 % (ref 11.5–15.5)
WBC: 17.2 10*3/uL — ABNORMAL HIGH (ref 4.0–10.5)
nRBC: 0 % (ref 0.0–0.2)

## 2019-09-26 LAB — BASIC METABOLIC PANEL
Anion gap: 9 (ref 5–15)
BUN: 16 mg/dL (ref 8–23)
CO2: 20 mmol/L — ABNORMAL LOW (ref 22–32)
Calcium: 8.8 mg/dL — ABNORMAL LOW (ref 8.9–10.3)
Chloride: 107 mmol/L (ref 98–111)
Creatinine, Ser: 0.75 mg/dL (ref 0.61–1.24)
GFR calc Af Amer: >60 mL/min (ref >60)
GFR calc non Af Amer: >60 mL/min (ref >60)
Glucose, Bld: 146 mg/dL — ABNORMAL HIGH (ref 70–99)
Potassium: 4.1 mmol/L (ref 3.5–5.1)
Sodium: 136 mmol/L (ref 135–145)

## 2019-09-26 NOTE — Evaluation (Signed)
Occupational Therapy Evaluation and Discharge Patient Details Name: Rodney Foster MRN: [id number] DOB: 01/21/1950 Today's Date: 09/26/2019    History of Present Illness Patient is 69 y.o. male s/p Rt TKA on 09/25/19 with PMH significant for OA, HLD, HTN, and a chronic cough.   Clinical Impression   Pt is functioning at a supervision to min assist level in ADL and mobility. Pt with decreased safety awareness, instructed to slow pace and adhere to safety. Pt verbalizing understanding. No further OT needs.    Follow Up Recommendations  No OT follow up    Equipment Recommendations  None recommended by OT    Recommendations for Other Services       Precautions / Restrictions Precautions Precautions: Fall Restrictions Weight Bearing Restrictions: No      Mobility Bed Mobility               General bed mobility comments: Pt in chair.  Transfers Overall transfer level: Needs assistance Equipment used: Rolling walker (2 wheeled) Transfers: Sit to/from Stand Sit to Stand: Supervision         General transfer comment: for safety, pt with impulsivity    Balance Overall balance assessment: Needs assistance   Sitting balance-Leahy Scale: Normal     Standing balance support: During functional activity;Bilateral upper extremity supported Standing balance-Leahy Scale: Fair                             ADL either performed or assessed with clinical judgement   ADL Overall ADL's : Needs assistance/impaired Eating/Feeding: Independent;Sitting   Grooming: Wash/dry hands;Standing;Supervision/safety   Upper Body Bathing: Set up;Sitting   Lower Body Bathing: Sit to/from stand;Supervison/ safety   Upper Body Dressing : Set up;Sitting   Lower Body Dressing: Supervision/safety;Minimal assistance;Sit to/from stand   Toilet Transfer: Supervision/safety;Ambulation   Toileting- Clothing Manipulation and Hygiene: Supervision/safety;Sit to/from Psychologist, educational Details (indicate cue type and reason): demonstrated technique with rw Functional mobility during ADLs: Supervision/safety;Rolling walker       Vision Baseline Vision/History: Wears glasses Wears Glasses: At all times Patient Visual Report: No change from baseline       Perception     Praxis      Pertinent Vitals/Pain Pain Assessment: Faces Faces Pain Scale: Hurts a little bit Pain Location: Rt Knee (pt does not rate, states he can feel his knee more finally) Pain Descriptors / Indicators: Aching;Guarding Pain Intervention(s): Monitored during session;Repositioned;Ice applied     Hand Dominance Right   Extremity/Trunk Assessment Upper Extremity Assessment Upper Extremity Assessment: Overall WFL for tasks assessed   Lower Extremity Assessment Lower Extremity Assessment: Defer to PT evaluation   Cervical / Trunk Assessment Cervical / Trunk Assessment: Normal   Communication Communication Communication: No difficulties   Cognition Arousal/Alertness: Awake/alert Behavior During Therapy: Impulsive Overall Cognitive Status: Within Functional Limits for tasks assessed                                     General Comments       Exercises     Shoulder Instructions      Home Living Family/patient expects to be discharged to:: Private residence Living Arrangements: Spouse/significant other Available Help at Discharge: Friend(s) Type of Home: House Home Access: Stairs to enter [employer] of Steps: 3 Entrance Stairs-Rails: None Home Layout: Two level;Able to live on main  level with bedroom/bathroom;Full bath on main level     Bathroom Shower/Tub: Occupational psychologist: Standard     Home Equipment: Environmental consultant - 2 wheels;Cane - single point;Bedside commode   Additional Comments: pt lives with girlfriend, he works part time from home and has an office upstairs but does lives on main level      Prior  Functioning/Environment Level of Independence: Independent        Comments: pt was ambulating with no device prior to surgery however wa hobbling due to pain        OT Problem List: Impaired balance (sitting and/or standing);Decreased safety awareness;Decreased knowledge of use of DME or AE;Pain      OT Treatment/Interventions:      OT Goals(Current goals can be found in the care plan section) Acute Rehab OT Goals Patient Stated Goal: return home and walk without walker  OT Frequency:     Barriers to D/C:            Co-evaluation              AM-PAC OT "6 Clicks" Daily Activity     Outcome Measure Help from another person eating meals?: None Help from another person taking care of personal grooming?: A Little Help from another person toileting, which includes using toliet, bedpan, or urinal?: A Little Help from another person bathing (including washing, rinsing, drying)?: A Little Help from another person to put on and taking off regular upper body clothing?: None Help from another person to put on and taking off regular lower body clothing?: A Little 6 Click Score: 20   End of Session    Activity Tolerance: Patient tolerated treatment well Patient left: in chair;with call bell/phone within reach;with nursing/sitter in room                   Time: 1021-1042 OT Time Calculation (min): 21 min Charges:  OT General Charges $OT Visit: 1 Visit OT Evaluation $OT Eval Low Complexity: 1 Low  Nestor Lewandowsky, OTR/L Acute Rehabilitation Services Pager: [phone] Office: [phone]  Malka So 09/26/2019, 10:48 AM

## 2019-09-26 NOTE — Discharge Summary (Signed)
SPORTS MEDICINE & JOINT REPLACEMENT   Lara Mulch, MD   Carlyon Shadow, PA-C [place], [place], [place]  [address]                             [phone]  PATIENT ID: Rodney Foster        MRN:  [id number]          DOB/AGE: 71/23/1951 / 69 y.o.    DISCHARGE SUMMARY  ADMISSION DATE:    09/25/2019 DISCHARGE DATE:   09/26/2019   ADMISSION DIAGNOSIS: OA RIGHT KNEE    DISCHARGE DIAGNOSIS:  OA RIGHT KNEE    ADDITIONAL DIAGNOSIS: Active Problems:   Primary localized osteoarthritis of right knee  Past Medical History:  Diagnosis Date  . Backache, unspecified   . Bronchitis   . Cough variant asthma 05/03/2017  . Genital herpes   . Gout   . Hearing loss   . Hemorrhoids   . Hernia   . Herpes simplex   . Hyperglycemia   . Hyperlipidemia   . Hypertension   . Impotence   . Insomnia, unspecified   . Osteoarthrosis, unspecified whether generalized or localized, unspecified site   . Tinea pedis, right   . Unspecified disorder of male genital organs     PROCEDURE: Procedure(s): TOTAL KNEE ARTHROPLASTY on 09/25/2019  CONSULTS:    HISTORY:  See H&P in chart  HOSPITAL COURSE:  Rodney Foster is a 69 y.o. admitted on 09/25/2019 and found to have a diagnosis of OA RIGHT KNEE.  After appropriate laboratory studies were obtained  they were taken to the operating room on 09/25/2019 and underwent Procedure(s): TOTAL KNEE ARTHROPLASTY.   They were given perioperative antibiotics:  Anti-infectives (From admission, onward)   Start     Dose/Rate Route Frequency Ordered Stop   09/25/19 1500  acyclovir (ZOVIRAX) tablet 400 mg     400 mg Oral Daily 09/25/19 1233     09/25/19 1400  ceFAZolin (ANCEF) IVPB 2g/100 mL premix     2 g 200 mL/hr over 30 Minutes Intravenous Every 6 hours 09/25/19 1233 09/25/19 2120   09/25/19 0600  ceFAZolin (ANCEF) IVPB 2g/100 mL premix     2 g 200 mL/hr over 30 Minutes Intravenous On call to O.R. 09/25/19 0532 09/25/19 0753   09/25/19 0538  ceFAZolin  (ANCEF) 2-4 GM/100ML-% IVPB    Note to Pharmacy: Charmayne Sheer   : cabinet override      09/25/19 0538 09/25/19 [id number]    .  Patient given tranexamic acid IV or topical and exparel intra-operatively.  Tolerated the procedure well.    POD# 1: Vital signs were stable.  Patient denied Chest pain, shortness of breath, or calf pain.  Patient was started on Aspirin twice daily at 8am.  Consults to PT, OT, and care management were made.  The patient was weight bearing as tolerated.  CPM was placed on the operative leg 0-90 degrees for 6-8 hours a day. When out of the CPM, patient was placed in the foam block to achieve full extension. Incentive spirometry was taught.  Dressing was changed.       POD #2, Continued  PT for ambulation and exercise program.  IV saline locked.  O2 discontinued.    The remainder of the hospital course was dedicated to ambulation and strengthening.   The patient was discharged on 1 Day Post-Op in  Good condition.  Blood products given:none  DIAGNOSTIC  STUDIES: Recent vital signs:  Patient Vitals for the past 24 hrs:  BP Temp Temp src Pulse Resp SpO2 Height Weight  09/26/19 0129 113/69 97.7 F (36.5 C) Oral 65 16 97 % - -  09/25/19 2142 120/74 98.3 F (36.8 C) Oral 79 16 98 % - -  09/25/19 1720 125/73 98 F (36.7 C) - 88 15 96 % - -  09/25/19 1320 (!) 148/71 (!) 97.5 F (36.4 C) Oral 70 15 98 % - -  09/25/19 1220 124/76 97.7 F (36.5 C) Oral 70 15 99 % 5\' 11"  (1.803 m) 100.7 kg  09/25/19 1215 121/77 - - 67 15 99 % - -  09/25/19 1200 116/77 (!) 97.4 F (36.3 C) - 63 17 99 % - -  09/25/19 1145 114/72 - - (!) 59 18 100 % - -  09/25/19 1130 119/71 (!) 96.3 F (35.7 C) - 63 10 100 % - -  09/25/19 1115 113/75 - - 60 12 100 % - -  09/25/19 1100 118/70 - - (!) 59 14 98 % - -  09/25/19 1045 124/67 - - (!) 59 19 99 % - -  09/25/19 1030 114/69 - - (!) 58 18 98 % - -  09/25/19 1015 110/71 - - 62 15 100 % - -  09/25/19 1000 112/74 (!) 97.5 F (36.4 C) - 71 19 98  % - -       Recent laboratory studies: Recent Labs    09/26/19 0254  WBC 17.2*  HGB 13.0  HCT 38.0*  PLT 304   Recent Labs    09/26/19 0254  NA 136  K 4.1  CL 107  CO2 20*  BUN 16  CREATININE 0.75  GLUCOSE 146*  CALCIUM 8.8*   Lab Results  Component Value Date   INR 1.1 09/17/2019     Recent Radiographic Studies :  No results found.  DISCHARGE INSTRUCTIONS: Discharge Instructions    Call MD / Call 911   Complete by: As directed    If you experience chest pain or shortness of breath, CALL 911 and be transported to the hospital emergency room.  If you develope a fever above 101 F, pus (white drainage) or increased drainage or redness at the wound, or calf pain, call your surgeon's office.   Constipation Prevention   Complete by: As directed    Drink plenty of fluids.  Prune juice may be helpful.  You may use a stool softener, such as Colace (over the counter) 100 mg twice a day.  Use MiraLax (over the counter) for constipation as needed.   Diet - low sodium heart healthy   Complete by: As directed    Discharge instructions   Complete by: As directed    INSTRUCTIONS AFTER JOINT REPLACEMENT   Remove items at home which could result in a fall. This includes throw rugs or furniture in walking pathways ICE to the affected joint every three hours while awake for 30 minutes at a time, for at least the first 3-5 days, and then as needed for pain and swelling.  Continue to use ice for pain and swelling. You may notice swelling that will progress down to the foot and ankle.  This is normal after surgery.  Elevate your leg when you are not up walking on it.   Continue to use the breathing machine you got in the hospital (incentive spirometer) which will help keep your temperature down.  It is common for your temperature to cycle  up and down following surgery, especially at night when you are not up moving around and exerting yourself.  The breathing machine keeps your lungs expanded  and your temperature down.   DIET:  As you were doing prior to hospitalization, we recommend a well-balanced diet.  DRESSING / WOUND CARE / SHOWERING  Keep the surgical dressing until follow up.  The dressing is water proof, so you can shower without any extra covering.  IF THE DRESSING FALLS OFF or the wound gets wet inside, change the dressing with sterile gauze.  Please use good hand washing techniques before changing the dressing.  Do not use any lotions or creams on the incision until instructed by your surgeon.    ACTIVITY  Increase activity slowly as tolerated, but follow the weight bearing instructions below.   No driving for 6 weeks or until further direction given by your physician.  You cannot drive while taking narcotics.  No lifting or carrying greater than 10 lbs. until further directed by your surgeon. Avoid periods of inactivity such as sitting longer than an hour when not asleep. This helps prevent blood clots.  You may return to work once you are authorized by your doctor.     WEIGHT BEARING   Weight bearing as tolerated with assist device (walker, cane, etc) as directed, use it as long as suggested by your surgeon or therapist, typically at least 4-6 weeks.   EXERCISES  Results after joint replacement surgery are often greatly improved when you follow the exercise, range of motion and muscle strengthening exercises prescribed by your doctor. Safety measures are also important to protect the joint from further injury. Any time any of these exercises cause you to have increased pain or swelling, decrease what you are doing until you are comfortable again and then slowly increase them. If you have problems or questions, call your caregiver or physical therapist for advice.   Rehabilitation is important following a joint replacement. After just a few days of immobilization, the muscles of the leg can become weakened and shrink (atrophy).  These exercises are designed to  build up the tone and strength of the thigh and leg muscles and to improve motion. Often times heat used for twenty to thirty minutes before working out will loosen up your tissues and help with improving the range of motion but do not use heat for the first two weeks following surgery (sometimes heat can increase post-operative swelling).   These exercises can be done on a training (exercise) mat, on the floor, on a table or on a bed. Use whatever works the best and is most comfortable for you.    Use music or television while you are exercising so that the exercises are a pleasant break in your day. This will make your life better with the exercises acting as a break in your routine that you can look forward to.   Perform all exercises about fifteen times, three times per day or as directed.  You should exercise both the operative leg and the other leg as well.   Exercises include:   Quad Sets - Tighten up the muscle on the front of the thigh (Quad) and hold for 5-10 seconds.   Straight Leg Raises - With your knee straight (if you were given a brace, keep it on), lift the leg to 60 degrees, hold for 3 seconds, and slowly lower the leg.  Perform this exercise against resistance later as your leg gets stronger.  Leg Slides:  Lying on your back, slowly slide your foot toward your buttocks, bending your knee up off the floor (only go as far as is comfortable). Then slowly slide your foot back down until your leg is flat on the floor again.  Angel Wings: Lying on your back spread your legs to the side as far apart as you can without causing discomfort.  Hamstring Strength:  Lying on your back, push your heel against the floor with your leg straight by tightening up the muscles of your buttocks.  Repeat, but this time bend your knee to a comfortable angle, and push your heel against the floor.  You may put a pillow under the heel to make it more comfortable if necessary.   A rehabilitation program following  joint replacement surgery can speed recovery and prevent re-injury in the future due to weakened muscles. Contact your doctor or a physical therapist for more information on knee rehabilitation.    CONSTIPATION  Constipation is defined medically as fewer than three stools per week and severe constipation as less than one stool per week.  Even if you have a regular bowel pattern at home, your normal regimen is likely to be disrupted due to multiple reasons following surgery.  Combination of anesthesia, postoperative narcotics, change in appetite and fluid intake all can affect your bowels.   YOU MUST use at least one of the following options; they are listed in order of increasing strength to get the job done.  They are all available over the counter, and you may need to use some, POSSIBLY even all of these options:    Drink plenty of fluids (prune juice may be helpful) and high fiber foods Colace 100 mg by mouth twice a day  Senokot for constipation as directed and as needed Dulcolax (bisacodyl), take with full glass of water  Miralax (polyethylene glycol) once or twice a day as needed.  If you have tried all these things and are unable to have a bowel movement in the first 3-4 days after surgery call either your surgeon or your primary doctor.    If you experience loose stools or diarrhea, hold the medications until you stool forms back up.  If your symptoms do not get better within 1 week or if they get worse, check with your doctor.  If you experience "the worst abdominal pain ever" or develop nausea or vomiting, please contact the office immediately for further recommendations for treatment.   ITCHING:  If you experience itching with your medications, try taking only a single pain pill, or even half a pain pill at a time.  You can also use Benadryl over the counter for itching or also to help with sleep.   TED HOSE STOCKINGS:  Use stockings on both legs until for at least 2 weeks or as  directed by physician office. They may be removed at night for sleeping.  MEDICATIONS:  See your medication summary on the "After Visit Summary" that nursing will review with you.  You may have some home medications which will be placed on hold until you complete the course of blood thinner medication.  It is important for you to complete the blood thinner medication as prescribed.  PRECAUTIONS:  If you experience chest pain or shortness of breath - call 911 immediately for transfer to the hospital emergency department.   If you develop a fever greater that 101 F, purulent drainage from wound, increased redness or drainage from wound, foul odor from the wound/dressing, or  calf pain - CONTACT YOUR SURGEON.                                                   FOLLOW-UP APPOINTMENTS:  If you do not already have a post-op appointment, please call the office for an appointment to be seen by your surgeon.  Guidelines for how soon to be seen are listed in your "After Visit Summary", but are typically between 1-4 weeks after surgery.  OTHER INSTRUCTIONS:   Knee Replacement:  Do not place pillow under knee, focus on keeping the knee straight while resting. CPM instructions: 0-90 degrees, 2 hours in the morning, 2 hours in the afternoon, and 2 hours in the evening. Place foam block, curve side up under heel at all times except when in CPM or when walking.  DO NOT modify, tear, cut, or change the foam block in any way.  MAKE SURE YOU:  Understand these instructions.  Get help right away if you are not doing well or get worse.    Thank you for letting us be a part of your medical care team.  It is a privilege we respect greatly.  We hope these instructions will help you stay on track for a fast and full recovery!   Increase activity slowly as tolerated   Complete by: As directed       DISCHARGE MEDICATIONS:   Allergies as of 09/26/2019   No Known Allergies     Medication List    TAKE these  medications   acetaminophen 325 MG tablet Commonly known as: Tylenol Take 2 tablets (650 mg total) by mouth every 4 (four) hours as needed.   acyclovir 400 MG tablet Commonly known as: ZOVIRAX TAKE 1 TO PREVENT HERPES AND TAKE 5 TABLETS PER DAY FOR ACTIVE HERPES What changed: See the new instructions.   amLODipine 5 MG tablet Commonly known as: NORVASC TAKE 1 TABLET MOUTH DAILY TO CONTROL BLOOD PRESSURE What changed:   how much to take  how to take this  when to take this   ANTIOXIDANT PO Take 30 mLs by mouth daily. Aronia Berry Juice   aspirin EC 81 MG tablet Take 1 tablet (81 mg total) by mouth 2 (two) times daily. TO PREVENT BLOOD CLOTS   atorvastatin 20 MG tablet Commonly known as: LIPITOR TAKE 1 TABLET BY MOUTH EVERY DAY What changed: when to take this   docusate sodium 100 MG capsule Commonly known as: Colace Take 1 capsule (100 mg total) by mouth daily as needed.   febuxostat 40 MG tablet Commonly known as: ULORIC Take 1 tablet (40 mg total) by mouth daily.   gabapentin 300 MG capsule Commonly known as: Neurontin Take 1 capsule (300 mg total) by mouth 3 (three) times daily for 14 days, THEN 1 capsule (300 mg total) 2 (two) times daily for 3 days, THEN 1 capsule (300 mg total) daily for 4 days. Start taking on: September 25, 2019   losartan 100 MG tablet Commonly known as: COZAAR TAKE ONE TABLET BY MOUTH ONCE DAILY TO CONTROL BLOOD PRESSURE What changed: See the new instructions.   Mucinex 600 MG 12 hr tablet Generic drug: guaiFENesin Take 600-1,200 mg by mouth at bedtime as needed for cough or to loosen phlegm.   omeprazole 20 MG capsule Commonly known as: PRILOSEC Take 20 mg by mouth daily.  oxyCODONE 5 MG immediate release tablet Commonly known as: Oxy IR/ROXICODONE Take one tab po q4-6hrs prn pain, may need 1-2 first couple weeks   tadalafil 20 MG tablet Commonly known as: CIALIS Take 1 tablet (20 mg total) by mouth daily as needed for  erectile dysfunction.   zolpidem 5 MG tablet Commonly known as: AMBIEN Take one tablet by mouth at bedtime as needed for sleep            Durable Medical Equipment  (From admission, onward)         Start     Ordered   09/25/19 1234  DME Walker rolling  Once    Question:  Patient needs a walker to treat with the following condition  Answer:  Primary localized osteoarthritis of right knee   09/25/19 1233   09/25/19 1234  DME 3 n 1  Once     09/25/19 1233          FOLLOW UP VISIT:   Follow-up Information    Earlie Server, MD. Schedule an appointment as soon as possible for a visit in 2 weeks.   Specialty: Orthopedic Surgery Contact information: [place] [address] [phone]           DISPOSITION: HOME VS. SNF  CONDITION:  Good   Donia Ast 09/26/2019, 6:04 AM

## 2019-09-26 NOTE — Progress Notes (Signed)
Patient discharged to home w/ family. Given all belongings, instructions. Verbalized understanding of all instructions. Escorted to pov via w/c. 

## 2019-09-26 NOTE — Progress Notes (Signed)
SPORTS MEDICINE AND JOINT REPLACEMENT  Lara Mulch, MD    Carlyon Shadow, PA-C [place], [place], [place]  [address]                             [phone]   PROGRESS NOTE  Subjective:  negative for Chest Pain  negative for Shortness of Breath  negative for Nausea/Vomiting   negative for Calf Pain  negative for Bowel Movement   Tolerating Diet: yes         Patient reports pain as 3 on 0-10 scale.    Objective: Vital signs in last 24 hours:    Patient Vitals for the past 24 hrs:  BP Temp Temp src Pulse Resp SpO2 Height Weight  09/26/19 0129 113/69 97.7 F (36.5 C) Oral 65 16 97 % - -  09/25/19 2142 120/74 98.3 F (36.8 C) Oral 79 16 98 % - -  09/25/19 1720 125/73 98 F (36.7 C) - 88 15 96 % - -  09/25/19 1320 (!) 148/71 (!) 97.5 F (36.4 C) Oral 70 15 98 % - -  09/25/19 1220 124/76 97.7 F (36.5 C) Oral 70 15 99 % 5\' 11"  (1.803 m) 100.7 kg  09/25/19 1215 121/77 - - 67 15 99 % - -  09/25/19 1200 116/77 (!) 97.4 F (36.3 C) - 63 17 99 % - -  09/25/19 1145 114/72 - - (!) 59 18 100 % - -  09/25/19 1130 119/71 (!) 96.3 F (35.7 C) - 63 10 100 % - -  09/25/19 1115 113/75 - - 60 12 100 % - -  09/25/19 1100 118/70 - - (!) 59 14 98 % - -  09/25/19 1045 124/67 - - (!) 59 19 99 % - -  09/25/19 1030 114/69 - - (!) 58 18 98 % - -  09/25/19 1015 110/71 - - 62 15 100 % - -  09/25/19 1000 112/74 (!) 97.5 F (36.4 C) - 71 19 98 % - -    @flow {1959:LAST@   Intake/Output from previous day:   09/25 0701 - 09/26 0700 In: 3067 [P.O.:360; I.V.:2507] Out: 3950 [Urine:3900]   Intake/Output this shift:   09/25 1901 - 09/26 0700 In: 1188.9 [P.O.:360; I.V.:828.9] Out: 1375 [Urine:1375]   Intake/Output      09/25 0701 - 09/26 0700   P.O. 360   I.V. (mL/kg) 2507 (24.9)   IV Piggyback 200   Total Intake(mL/kg) 3067 (30.5)   Urine (mL/kg/hr) 3900 (1.6)   Blood 50   Total Output 3950   Net -883          LABORATORY DATA: Recent Labs    09/26/19 0254  WBC 17.2*   HGB 13.0  HCT 38.0*  PLT 304   Recent Labs    09/26/19 0254  NA 136  K 4.1  CL 107  CO2 20*  BUN 16  CREATININE 0.75  GLUCOSE 146*  CALCIUM 8.8*   Lab Results  Component Value Date   INR 1.1 09/17/2019    Examination:  General appearance: alert, cooperative and no distress Extremities: extremities normal, atraumatic, no cyanosis or edema  Wound Exam: clean, dry, intact   Drainage:  None: wound tissue dry  Motor Exam: Quadriceps and Hamstrings Intact  Sensory Exam: Superficial Peroneal, Deep Peroneal and Tibial normal   Assessment:    1 Day Post-Op  Procedure(s) (LRB): TOTAL KNEE ARTHROPLASTY (Right)  ADDITIONAL DIAGNOSIS:  Active Problems:  Primary localized osteoarthritis of right knee     Plan: Physical Therapy as ordered Weight Bearing as Tolerated (WBAT)  DVT Prophylaxis:  Aspirin  DISCHARGE PLAN: Home  Patient doing great, expect D/C home today once cleared by PT       Patient's anticipated LOS is less than 2 midnights, meeting these requirements: - Lives within 1 hour of care - Has a competent adult at home to recover with post-op recover - NO history of  - Chronic pain requiring opiods  - Diabetes  - Coronary Artery Disease  - Heart failure  - Heart attack  - Stroke  - DVT/VTE  - Cardiac arrhythmia  - Respiratory Failure/COPD  - Renal failure  - Anemia  - Advanced Liver disease        Donia Ast 09/26/2019, 6:01 AM

## 2019-09-26 NOTE — Progress Notes (Signed)
Physical Therapy Treatment Patient Details Name: Rodney Foster MRN: [id number] DOB: Aug 18, 1950 Today's Date: 09/26/2019    History of Present Illness Patient is 69 y.o. male s/p Rt TKA on 09/25/19 with PMH significant for OA, HLD, HTN, and a chronic cough.    PT Comments    Progressing well with mobility. Reviewed/practiced exercises, gait training, and stair training. Issued HEP for pt to perform 2-3x/day until he begins OP PT. All education completed. Okay to d/c from PT standpoint.    Follow Up Recommendations  Follow surgeon's recommendation for DC plan and follow-up therapies     Equipment Recommendations  None recommended by PT    Recommendations for Other Services       Precautions / Restrictions Precautions Precautions: Fall Restrictions Weight Bearing Restrictions: No Other Position/Activity Restrictions: WBAT    Mobility  Bed Mobility               General bed mobility comments: Pt in chair.  Transfers Overall transfer level: Needs assistance Equipment used: Rolling walker (2 wheeled) Transfers: Sit to/from Stand Sit to Stand: Supervision         General transfer comment: for safety  Ambulation/Gait Ambulation/Gait assistance: Supervision Gait Distance (Feet): 125 Feet Assistive device: Rolling walker (2 wheeled) Gait Pattern/deviations: Step-to pattern;Step-through pattern;Decreased stride length     General Gait Details: for safety. cues for proper use/distance of RW, step length   Stairs Stairs: Yes Min guard/Min assist    Number of Stairs: 5 General stair comments: up and over portable steps x 2. Once with 1 rail, 1 cane to simulate getting upstairs to bedroom/full bath (Min guard assist). Then once with 1 cane to simulate entry into home (Min assist). Cues for safety, technique, sequence.   Wheelchair Mobility    Modified Rankin (Stroke Patients Only)       Balance Overall balance assessment: Needs assistance   Sitting  balance-Leahy Scale: Normal     Standing balance support: During functional activity;Bilateral upper extremity supported Standing balance-Leahy Scale: Fair                              Cognition Arousal/Alertness: Awake/alert Behavior During Therapy: Impulsive Overall Cognitive Status: Within Functional Limits for tasks assessed                                        Exercises      General Comments        Pertinent Vitals/Pain Pain Assessment: 0-10 Pain Score: 6  Faces Pain Scale: Hurts a little bit Pain Location: R knee Pain Descriptors / Indicators: Sore;Discomfort;Aching Pain Intervention(s): Monitored during session;Ice applied;Repositioned    Home Living Family/patient expects to be discharged to:: Private residence Living Arrangements: Spouse/significant other Available Help at Discharge: Friend(s) Type of Home: House Home Access: Stairs to enter Entrance Stairs-Rails: None Home Layout: Two level;Able to live on main level with bedroom/bathroom;Full bath on main level Home Equipment: Walker - 2 wheels;Cane - single point;Bedside commode Additional Comments: pt lives with girlfriend, he works part time from home and has an office upstairs but does lives on main level    Prior Function Level of Independence: Independent      Comments: pt was ambulating with no device prior to surgery however wa hobbling due to pain   PT Goals (current goals can now be found in  the care plan section) Acute Rehab PT Goals Patient Stated Goal: return home and walk without walker Progress towards PT goals: Progressing toward goals    Frequency    7X/week      PT Plan Current plan remains appropriate    Co-evaluation              AM-PAC PT "6 Clicks" Mobility   Outcome Measure  Help needed turning from your back to your side while in a flat bed without using bedrails?: A Little Help needed moving from lying on your back to sitting on  the side of a flat bed without using bedrails?: A Little Help needed moving to and from a bed to a chair (including a wheelchair)?: A Little Help needed standing up from a chair using your arms (e.g., wheelchair or bedside chair)?: A Little Help needed to walk in hospital room?: A Little Help needed climbing 3-5 steps with a railing? : A Little 6 Click Score: 18    End of Session Equipment Utilized During Treatment: Gait belt Activity Tolerance: Patient tolerated treatment well Patient left: in chair;with call bell/phone within reach   PT Visit Diagnosis: Unsteadiness on feet (R26.81);Other abnormalities of gait and mobility (R26.89);Muscle weakness (generalized) (M62.81);Difficulty in walking, not elsewhere classified (R26.2)     Time: [id number] PT Time Calculation (min) (ACUTE ONLY): 28 min  Charges:  $Gait Training: 8-22 mins $Therapeutic Exercise: 8-22 mins                       Weston Anna, PT Acute Rehabilitation Services Pager: [phone] Office: [phone]

## 2019-09-28 ENCOUNTER — Encounter (HOSPITAL_COMMUNITY): Payer: Self-pay | Admitting: Orthopedic Surgery

## 2019-09-28 DIAGNOSIS — Z471 Aftercare following joint replacement surgery: Secondary | ICD-10-CM | POA: Diagnosis not present

## 2019-09-28 DIAGNOSIS — M6281 Muscle weakness (generalized): Secondary | ICD-10-CM | POA: Diagnosis not present

## 2019-09-28 DIAGNOSIS — R262 Difficulty in walking, not elsewhere classified: Secondary | ICD-10-CM | POA: Diagnosis not present

## 2019-09-28 DIAGNOSIS — M1711 Unilateral primary osteoarthritis, right knee: Secondary | ICD-10-CM | POA: Diagnosis not present

## 2019-10-02 DIAGNOSIS — M6281 Muscle weakness (generalized): Secondary | ICD-10-CM | POA: Diagnosis not present

## 2019-10-02 DIAGNOSIS — M25661 Stiffness of right knee, not elsewhere classified: Secondary | ICD-10-CM | POA: Diagnosis not present

## 2019-10-02 DIAGNOSIS — M1711 Unilateral primary osteoarthritis, right knee: Secondary | ICD-10-CM | POA: Diagnosis not present

## 2019-10-02 DIAGNOSIS — R262 Difficulty in walking, not elsewhere classified: Secondary | ICD-10-CM | POA: Diagnosis not present

## 2019-10-05 DIAGNOSIS — M6281 Muscle weakness (generalized): Secondary | ICD-10-CM | POA: Diagnosis not present

## 2019-10-05 DIAGNOSIS — M1711 Unilateral primary osteoarthritis, right knee: Secondary | ICD-10-CM | POA: Diagnosis not present

## 2019-10-05 DIAGNOSIS — M25661 Stiffness of right knee, not elsewhere classified: Secondary | ICD-10-CM | POA: Diagnosis not present

## 2019-10-05 DIAGNOSIS — R262 Difficulty in walking, not elsewhere classified: Secondary | ICD-10-CM | POA: Diagnosis not present

## 2019-10-07 DIAGNOSIS — M25661 Stiffness of right knee, not elsewhere classified: Secondary | ICD-10-CM | POA: Diagnosis not present

## 2019-10-07 DIAGNOSIS — Z471 Aftercare following joint replacement surgery: Secondary | ICD-10-CM | POA: Diagnosis not present

## 2019-10-07 DIAGNOSIS — R262 Difficulty in walking, not elsewhere classified: Secondary | ICD-10-CM | POA: Diagnosis not present

## 2019-10-07 DIAGNOSIS — M1711 Unilateral primary osteoarthritis, right knee: Secondary | ICD-10-CM | POA: Diagnosis not present

## 2019-10-07 DIAGNOSIS — M6281 Muscle weakness (generalized): Secondary | ICD-10-CM | POA: Diagnosis not present

## 2019-10-08 DIAGNOSIS — M1711 Unilateral primary osteoarthritis, right knee: Secondary | ICD-10-CM | POA: Diagnosis not present

## 2019-10-09 DIAGNOSIS — M6281 Muscle weakness (generalized): Secondary | ICD-10-CM | POA: Diagnosis not present

## 2019-10-09 DIAGNOSIS — R262 Difficulty in walking, not elsewhere classified: Secondary | ICD-10-CM | POA: Diagnosis not present

## 2019-10-09 DIAGNOSIS — M25661 Stiffness of right knee, not elsewhere classified: Secondary | ICD-10-CM | POA: Diagnosis not present

## 2019-10-09 DIAGNOSIS — Z471 Aftercare following joint replacement surgery: Secondary | ICD-10-CM | POA: Diagnosis not present

## 2019-10-09 DIAGNOSIS — M1711 Unilateral primary osteoarthritis, right knee: Secondary | ICD-10-CM | POA: Diagnosis not present

## 2019-10-12 DIAGNOSIS — M1711 Unilateral primary osteoarthritis, right knee: Secondary | ICD-10-CM | POA: Diagnosis not present

## 2019-10-12 DIAGNOSIS — M25661 Stiffness of right knee, not elsewhere classified: Secondary | ICD-10-CM | POA: Diagnosis not present

## 2019-10-12 DIAGNOSIS — M6281 Muscle weakness (generalized): Secondary | ICD-10-CM | POA: Diagnosis not present

## 2019-10-12 DIAGNOSIS — R262 Difficulty in walking, not elsewhere classified: Secondary | ICD-10-CM | POA: Diagnosis not present

## 2019-10-14 DIAGNOSIS — M1711 Unilateral primary osteoarthritis, right knee: Secondary | ICD-10-CM | POA: Diagnosis not present

## 2019-10-14 DIAGNOSIS — R262 Difficulty in walking, not elsewhere classified: Secondary | ICD-10-CM | POA: Diagnosis not present

## 2019-10-14 DIAGNOSIS — Z471 Aftercare following joint replacement surgery: Secondary | ICD-10-CM | POA: Diagnosis not present

## 2019-10-14 DIAGNOSIS — M6281 Muscle weakness (generalized): Secondary | ICD-10-CM | POA: Diagnosis not present

## 2019-10-16 DIAGNOSIS — M25661 Stiffness of right knee, not elsewhere classified: Secondary | ICD-10-CM | POA: Diagnosis not present

## 2019-10-16 DIAGNOSIS — M6281 Muscle weakness (generalized): Secondary | ICD-10-CM | POA: Diagnosis not present

## 2019-10-16 DIAGNOSIS — R262 Difficulty in walking, not elsewhere classified: Secondary | ICD-10-CM | POA: Diagnosis not present

## 2019-10-16 DIAGNOSIS — M1711 Unilateral primary osteoarthritis, right knee: Secondary | ICD-10-CM | POA: Diagnosis not present

## 2019-10-19 DIAGNOSIS — R262 Difficulty in walking, not elsewhere classified: Secondary | ICD-10-CM | POA: Diagnosis not present

## 2019-10-19 DIAGNOSIS — M1711 Unilateral primary osteoarthritis, right knee: Secondary | ICD-10-CM | POA: Diagnosis not present

## 2019-10-19 DIAGNOSIS — Z471 Aftercare following joint replacement surgery: Secondary | ICD-10-CM | POA: Diagnosis not present

## 2019-10-19 DIAGNOSIS — G4730 Sleep apnea, unspecified: Secondary | ICD-10-CM | POA: Diagnosis not present

## 2019-10-19 DIAGNOSIS — M6281 Muscle weakness (generalized): Secondary | ICD-10-CM | POA: Diagnosis not present

## 2019-10-21 DIAGNOSIS — M1711 Unilateral primary osteoarthritis, right knee: Secondary | ICD-10-CM | POA: Diagnosis not present

## 2019-10-21 DIAGNOSIS — M25661 Stiffness of right knee, not elsewhere classified: Secondary | ICD-10-CM | POA: Diagnosis not present

## 2019-10-21 DIAGNOSIS — M6281 Muscle weakness (generalized): Secondary | ICD-10-CM | POA: Diagnosis not present

## 2019-10-21 DIAGNOSIS — R262 Difficulty in walking, not elsewhere classified: Secondary | ICD-10-CM | POA: Diagnosis not present

## 2019-10-26 ENCOUNTER — Other Ambulatory Visit: Payer: Self-pay | Admitting: *Deleted

## 2019-10-26 DIAGNOSIS — G4700 Insomnia, unspecified: Secondary | ICD-10-CM

## 2019-10-26 MED ORDER — ZOLPIDEM TARTRATE 5 MG PO TABS: ORAL_TABLET | ORAL | 0 refills | Status: DC

## 2019-10-26 NOTE — Telephone Encounter (Signed)
Patient requested refill

## 2019-11-09 DIAGNOSIS — M1711 Unilateral primary osteoarthritis, right knee: Secondary | ICD-10-CM | POA: Diagnosis not present

## 2019-11-09 DIAGNOSIS — M1712 Unilateral primary osteoarthritis, left knee: Secondary | ICD-10-CM | POA: Diagnosis not present

## 2019-11-09 DIAGNOSIS — M6281 Muscle weakness (generalized): Secondary | ICD-10-CM | POA: Diagnosis not present

## 2019-11-09 DIAGNOSIS — Z471 Aftercare following joint replacement surgery: Secondary | ICD-10-CM | POA: Diagnosis not present

## 2019-11-09 DIAGNOSIS — R262 Difficulty in walking, not elsewhere classified: Secondary | ICD-10-CM | POA: Diagnosis not present

## 2019-11-10 ENCOUNTER — Telehealth: Payer: Self-pay | Admitting: *Deleted

## 2019-11-10 NOTE — Telephone Encounter (Signed)
Received Form for Pre Op Clearance for patient to have Left Total Knee Replacement with [facility] [phone] Placed in [place] folder with note asking if patient needs to come in for another PreOp. Just had one done on 08/28/2019 for Right knee.  Awaiting Response from [place].

## 2019-11-11 DIAGNOSIS — M1711 Unilateral primary osteoarthritis, right knee: Secondary | ICD-10-CM | POA: Diagnosis not present

## 2019-11-11 DIAGNOSIS — M6281 Muscle weakness (generalized): Secondary | ICD-10-CM | POA: Diagnosis not present

## 2019-11-11 DIAGNOSIS — M25661 Stiffness of right knee, not elsewhere classified: Secondary | ICD-10-CM | POA: Diagnosis not present

## 2019-11-11 DIAGNOSIS — R262 Difficulty in walking, not elsewhere classified: Secondary | ICD-10-CM | POA: Diagnosis not present

## 2019-11-12 NOTE — Telephone Encounter (Signed)
Have you been able to review this form? Please Advise.

## 2019-11-12 NOTE — Telephone Encounter (Signed)
Yes will be completed tomorrow when I am back in office, should not need another appt unless there has been a change in his medical condition

## 2019-11-13 ENCOUNTER — Other Ambulatory Visit: Payer: Self-pay | Admitting: Nurse Practitioner

## 2019-11-13 DIAGNOSIS — M25661 Stiffness of right knee, not elsewhere classified: Secondary | ICD-10-CM | POA: Diagnosis not present

## 2019-11-13 DIAGNOSIS — M6281 Muscle weakness (generalized): Secondary | ICD-10-CM | POA: Diagnosis not present

## 2019-11-13 DIAGNOSIS — E782 Mixed hyperlipidemia: Secondary | ICD-10-CM

## 2019-11-13 DIAGNOSIS — M1711 Unilateral primary osteoarthritis, right knee: Secondary | ICD-10-CM | POA: Diagnosis not present

## 2019-11-13 DIAGNOSIS — R262 Difficulty in walking, not elsewhere classified: Secondary | ICD-10-CM | POA: Diagnosis not present

## 2019-11-16 DIAGNOSIS — M6281 Muscle weakness (generalized): Secondary | ICD-10-CM | POA: Diagnosis not present

## 2019-11-16 DIAGNOSIS — Z471 Aftercare following joint replacement surgery: Secondary | ICD-10-CM | POA: Diagnosis not present

## 2019-11-16 DIAGNOSIS — R262 Difficulty in walking, not elsewhere classified: Secondary | ICD-10-CM | POA: Diagnosis not present

## 2019-11-16 DIAGNOSIS — M1711 Unilateral primary osteoarthritis, right knee: Secondary | ICD-10-CM | POA: Diagnosis not present

## 2019-11-18 DIAGNOSIS — M25661 Stiffness of right knee, not elsewhere classified: Secondary | ICD-10-CM | POA: Diagnosis not present

## 2019-11-18 DIAGNOSIS — R262 Difficulty in walking, not elsewhere classified: Secondary | ICD-10-CM | POA: Diagnosis not present

## 2019-11-18 DIAGNOSIS — M1711 Unilateral primary osteoarthritis, right knee: Secondary | ICD-10-CM | POA: Diagnosis not present

## 2019-11-18 DIAGNOSIS — M6281 Muscle weakness (generalized): Secondary | ICD-10-CM | POA: Diagnosis not present

## 2019-11-19 DIAGNOSIS — G4730 Sleep apnea, unspecified: Secondary | ICD-10-CM | POA: Diagnosis not present

## 2019-11-23 ENCOUNTER — Other Ambulatory Visit (HOSPITAL_COMMUNITY): Payer: Self-pay | Admitting: Orthopedic Surgery

## 2019-11-23 ENCOUNTER — Other Ambulatory Visit: Payer: Self-pay | Admitting: *Deleted

## 2019-11-23 ENCOUNTER — Ambulatory Visit (HOSPITAL_COMMUNITY)
Admission: RE | Admit: 2019-11-23 | Discharge: 2019-11-23 | Disposition: A | Discharge status: Home / Self Care | Payer: Medicare HMO | Source: Ambulatory Visit | Attending: Cardiology | Admitting: Cardiology

## 2019-11-23 ENCOUNTER — Other Ambulatory Visit: Payer: Self-pay

## 2019-11-23 DIAGNOSIS — M7989 Other specified soft tissue disorders: Secondary | ICD-10-CM | POA: Diagnosis not present

## 2019-11-23 DIAGNOSIS — R262 Difficulty in walking, not elsewhere classified: Secondary | ICD-10-CM | POA: Diagnosis not present

## 2019-11-23 DIAGNOSIS — M79604 Pain in right leg: Secondary | ICD-10-CM | POA: Diagnosis not present

## 2019-11-23 DIAGNOSIS — M25661 Stiffness of right knee, not elsewhere classified: Secondary | ICD-10-CM | POA: Diagnosis not present

## 2019-11-23 DIAGNOSIS — M1711 Unilateral primary osteoarthritis, right knee: Secondary | ICD-10-CM | POA: Diagnosis not present

## 2019-11-23 DIAGNOSIS — M6281 Muscle weakness (generalized): Secondary | ICD-10-CM | POA: Diagnosis not present

## 2019-11-23 DIAGNOSIS — G4700 Insomnia, unspecified: Secondary | ICD-10-CM

## 2019-11-23 MED ORDER — ZOLPIDEM TARTRATE 5 MG PO TABS: ORAL_TABLET | ORAL | 0 refills | Status: DC

## 2019-11-23 NOTE — Telephone Encounter (Signed)
Patient requested refill. Phoned to pharmacy.  

## 2019-12-04 DIAGNOSIS — M25661 Stiffness of right knee, not elsewhere classified: Secondary | ICD-10-CM | POA: Diagnosis not present

## 2019-12-04 DIAGNOSIS — M1711 Unilateral primary osteoarthritis, right knee: Secondary | ICD-10-CM | POA: Diagnosis not present

## 2019-12-04 DIAGNOSIS — M6281 Muscle weakness (generalized): Secondary | ICD-10-CM | POA: Diagnosis not present

## 2019-12-04 DIAGNOSIS — R262 Difficulty in walking, not elsewhere classified: Secondary | ICD-10-CM | POA: Diagnosis not present

## 2019-12-17 ENCOUNTER — Other Ambulatory Visit: Payer: Self-pay | Admitting: Nurse Practitioner

## 2019-12-19 DIAGNOSIS — G4730 Sleep apnea, unspecified: Secondary | ICD-10-CM | POA: Diagnosis not present

## 2019-12-21 ENCOUNTER — Other Ambulatory Visit: Payer: Self-pay | Admitting: *Deleted

## 2019-12-21 DIAGNOSIS — M1A9XX0 Chronic gout, unspecified, without tophus (tophi): Secondary | ICD-10-CM

## 2019-12-21 DIAGNOSIS — G4733 Obstructive sleep apnea (adult) (pediatric): Secondary | ICD-10-CM | POA: Diagnosis not present

## 2019-12-21 DIAGNOSIS — G4730 Sleep apnea, unspecified: Secondary | ICD-10-CM | POA: Diagnosis not present

## 2019-12-21 MED ORDER — ACYCLOVIR 400 MG PO TABS: 400.0000 mg | ORAL_TABLET | Freq: Every day | ORAL | 0 refills | Status: DC

## 2019-12-21 MED ORDER — FEBUXOSTAT 40 MG PO TABS: 40.0000 mg | ORAL_TABLET | Freq: Every day | ORAL | 0 refills | Status: DC

## 2019-12-21 NOTE — Telephone Encounter (Signed)
Patient requested refill

## 2019-12-22 ENCOUNTER — Other Ambulatory Visit: Payer: Self-pay | Admitting: *Deleted

## 2019-12-22 DIAGNOSIS — M1A9XX0 Chronic gout, unspecified, without tophus (tophi): Secondary | ICD-10-CM

## 2019-12-22 MED ORDER — FEBUXOSTAT 40 MG PO TABS: 40.0000 mg | ORAL_TABLET | Freq: Every day | ORAL | 0 refills | Status: DC

## 2019-12-22 NOTE — Telephone Encounter (Signed)
Patient requested refill Pharmacy did not have the refill which was faxed yesterday. Refaxed.

## 2019-12-23 DIAGNOSIS — M25661 Stiffness of right knee, not elsewhere classified: Secondary | ICD-10-CM | POA: Diagnosis not present

## 2019-12-23 DIAGNOSIS — R262 Difficulty in walking, not elsewhere classified: Secondary | ICD-10-CM | POA: Diagnosis not present

## 2019-12-23 DIAGNOSIS — M6281 Muscle weakness (generalized): Secondary | ICD-10-CM | POA: Diagnosis not present

## 2019-12-23 DIAGNOSIS — M1711 Unilateral primary osteoarthritis, right knee: Secondary | ICD-10-CM | POA: Diagnosis not present

## 2019-12-24 ENCOUNTER — Other Ambulatory Visit: Payer: Self-pay

## 2019-12-24 DIAGNOSIS — G4700 Insomnia, unspecified: Secondary | ICD-10-CM

## 2019-12-24 MED ORDER — ZOLPIDEM TARTRATE 5 MG PO TABS: ORAL_TABLET | ORAL | 0 refills | Status: DC

## 2019-12-24 NOTE — Telephone Encounter (Signed)
Patient called requesting refill for ambien. Checked last refill 11/23/2019 and no contract on file. Called patient and set up appointment to come to update contract with provider.  Routing to provider for approval.

## 2020-01-06 DIAGNOSIS — M25661 Stiffness of right knee, not elsewhere classified: Secondary | ICD-10-CM | POA: Diagnosis not present

## 2020-01-06 DIAGNOSIS — M1711 Unilateral primary osteoarthritis, right knee: Secondary | ICD-10-CM | POA: Diagnosis not present

## 2020-01-06 DIAGNOSIS — R262 Difficulty in walking, not elsewhere classified: Secondary | ICD-10-CM | POA: Diagnosis not present

## 2020-01-06 DIAGNOSIS — M6281 Muscle weakness (generalized): Secondary | ICD-10-CM | POA: Diagnosis not present

## 2020-01-08 DIAGNOSIS — Z471 Aftercare following joint replacement surgery: Secondary | ICD-10-CM | POA: Diagnosis not present

## 2020-01-08 DIAGNOSIS — M25661 Stiffness of right knee, not elsewhere classified: Secondary | ICD-10-CM | POA: Diagnosis not present

## 2020-01-08 DIAGNOSIS — M1711 Unilateral primary osteoarthritis, right knee: Secondary | ICD-10-CM | POA: Diagnosis not present

## 2020-01-08 DIAGNOSIS — M6281 Muscle weakness (generalized): Secondary | ICD-10-CM | POA: Diagnosis not present

## 2020-01-11 ENCOUNTER — Encounter: Payer: Self-pay | Admitting: Nurse Practitioner

## 2020-01-11 ENCOUNTER — Other Ambulatory Visit: Payer: Self-pay

## 2020-01-11 ENCOUNTER — Ambulatory Visit (INDEPENDENT_AMBULATORY_CARE_PROVIDER_SITE_OTHER): Payer: Medicare HMO | Admitting: Nurse Practitioner

## 2020-01-11 VITALS — BP 128/80 | HR 76 | Temp 97.5°F | Ht 70.0 in | Wt 224.0 lb

## 2020-01-11 DIAGNOSIS — G4700 Insomnia, unspecified: Secondary | ICD-10-CM | POA: Diagnosis not present

## 2020-01-11 DIAGNOSIS — M1711 Unilateral primary osteoarthritis, right knee: Secondary | ICD-10-CM | POA: Diagnosis not present

## 2020-01-11 DIAGNOSIS — M25661 Stiffness of right knee, not elsewhere classified: Secondary | ICD-10-CM | POA: Diagnosis not present

## 2020-01-11 DIAGNOSIS — M6281 Muscle weakness (generalized): Secondary | ICD-10-CM | POA: Diagnosis not present

## 2020-01-11 DIAGNOSIS — R262 Difficulty in walking, not elsewhere classified: Secondary | ICD-10-CM | POA: Diagnosis not present

## 2020-01-11 NOTE — Patient Instructions (Addendum)
Would recommend cutting back on your alcohol as this can adversely effect your melatonin which effects your sleep  you can call [phone] for information about getting the covid vaccine through [facility] at the [facility].  There will eventually be a way to arrange an appointment electronically, as well.  I expect you will receive a mychart message, as well, about this soon.   Insomnia Insomnia is a sleep disorder that makes it difficult to fall asleep or stay asleep. Insomnia can cause fatigue, low energy, difficulty concentrating, mood swings, and poor performance at work or school. There are three different ways to classify insomnia:  Difficulty falling asleep.  Difficulty staying asleep.  Waking up too early in the morning. Any type of insomnia can be long-term (chronic) or short-term (acute). Both are common. Short-term insomnia usually lasts for three months or less. Chronic insomnia occurs at least three times a week for longer than three months. What are the causes? Insomnia may be caused by another condition, situation, or substance, such as:  Anxiety.  Certain medicines.  Gastroesophageal reflux disease (GERD) or other gastrointestinal conditions.  Asthma or other breathing conditions.  Restless legs syndrome, sleep apnea, or other sleep disorders.  Chronic pain.  Menopause.  Stroke.  Abuse of alcohol, tobacco, or illegal drugs.  Mental health conditions, such as depression.  Caffeine.  Neurological disorders, such as Alzheimer's disease.  An overactive thyroid (hyperthyroidism). Sometimes, the cause of insomnia may not be known. What increases the risk? Risk factors for insomnia include:  Gender. Women are affected more often than men.  Age. Insomnia is more common as you get older.  Stress.  Lack of exercise.  Irregular work schedule or working night shifts.  Traveling between different time zones.  Certain medical and mental health  conditions. What are the signs or symptoms? If you have insomnia, the main symptom is having trouble falling asleep or having trouble staying asleep. This may lead to other symptoms, such as:  Feeling fatigued or having low energy.  Feeling nervous about going to sleep.  Not feeling rested in the morning.  Having trouble concentrating.  Feeling irritable, anxious, or depressed. How is this diagnosed? This condition may be diagnosed based on:  Your symptoms and medical history. Your health care provider may ask about: ? Your sleep habits. ? Any medical conditions you have. ? Your mental health.  A physical exam. How is this treated? Treatment for insomnia depends on the cause. Treatment may focus on treating an underlying condition that is causing insomnia. Treatment may also include:  Medicines to help you sleep.  Counseling or therapy.  Lifestyle adjustments to help you sleep better. Follow these instructions at home: Eating and drinking   Limit or avoid alcohol, caffeinated beverages, and cigarettes, especially close to bedtime. These can disrupt your sleep.  Do not eat a large meal or eat spicy foods right before bedtime. This can lead to digestive discomfort that can make it hard for you to sleep. Sleep habits   Keep a sleep diary to help you and your health care provider figure out what could be causing your insomnia. Write down: ? When you sleep. ? When you wake up during the night. ? How well you sleep. ? How rested you feel the next day. ? Any side effects of medicines you are taking. ? What you eat and drink.  Make your bedroom a dark, comfortable place where it is easy to fall asleep. ? Put up shades or  blackout curtains to block light from outside. ? Use a white noise machine to block noise. ? Keep the temperature cool.  Limit screen use before bedtime. This includes: ? Watching TV. ? Using your smartphone, tablet, or computer.  Stick to a routine  that includes going to bed and waking up at the same times every day and night. This can help you fall asleep faster. Consider making a quiet activity, such as reading, part of your nighttime routine.  Try to avoid taking naps during the day so that you sleep better at night.  Get out of bed if you are still awake after 15 minutes of trying to sleep. Keep the lights down, but try reading or doing a quiet activity. When you feel sleepy, go back to bed. General instructions  Take over-the-counter and prescription medicines only as told by your health care provider.  Exercise regularly, as told by your health care provider. Avoid exercise starting several hours before bedtime.  Use relaxation techniques to manage stress. Ask your health care provider to suggest some techniques that may work well for you. These may include: ? Breathing exercises. ? Routines to release muscle tension. ? Visualizing peaceful scenes.  Make sure that you drive carefully. Avoid driving if you feel very sleepy.  Keep all follow-up visits as told by your health care provider. This is important. Contact a health care provider if:  You are tired throughout the day.  You have trouble in your daily routine due to sleepiness.  You continue to have sleep problems, or your sleep problems get worse. Get help right away if:  You have serious thoughts about hurting yourself or someone else. If you ever feel like you may hurt yourself or others, or have thoughts about taking your own life, get help right away. You can go to your nearest emergency department or call:  Your local emergency services (911 in the [place]  A suicide crisis helpline, such as the [place] at 419-594-0419. This is open 24 hours a day. Summary  Insomnia is a sleep disorder that makes it difficult to fall asleep or stay asleep.  Insomnia can be long-term (chronic) or short-term (acute).  Treatment for insomnia  depends on the cause. Treatment may focus on treating an underlying condition that is causing insomnia.  Keep a sleep diary to help you and your health care provider figure out what could be causing your insomnia. This information is not intended to replace advice given to you by your health care provider. Make sure you discuss any questions you have with your health care provider. Document Revised: 11/29/2017 Document Reviewed: 09/26/2017 Elsevier Patient Education  2020 [employer].

## 2020-01-11 NOTE — Progress Notes (Signed)
Careteam: Patient Care Team: [name], NP as PCP - General (Geriatric Medicine)  Advanced Directive information    No Known Allergies  Chief Complaint  Patient presents with  . Medication Management    Medication review and non-opioid treatment update.      HPI: Patient is a 70 y.o. male to update non-opioid narcotic contract.  Pt is taking Ambien to help him sleep.  Has not used it in the last 3 days and trying to cut back.  Has a glass of wine or cocktail in the evening.   Review of Systems:  Review of Systems  Constitutional: Negative for chills and fever.  Musculoskeletal: Positive for joint pain.  Neurological: Negative for dizziness and headaches.  Psychiatric/Behavioral: Negative for depression. The patient has insomnia (occasional). The patient is not nervous/anxious.     Past Medical History:  Diagnosis Date  . Backache, unspecified   . Bronchitis   . Cough variant asthma 05/03/2017  . Genital herpes   . Gout   . Hearing loss   . Hemorrhoids   . Hernia   . Herpes simplex   . Hyperglycemia   . Hyperlipidemia   . Hypertension   . Impotence   . Insomnia, unspecified   . Osteoarthrosis, unspecified whether generalized or localized, unspecified site   . Tinea pedis, right   . Unspecified disorder of male genital organs    Past Surgical History:  Procedure Laterality Date  . fracture left femur  1984  . ganglion repair of left forarm  2000  . IVP  27   ML old comp fracture  . KNEE SURGERY Right 01/21/2019   [facility]   . NASAL SEPTUM SURGERY  1997  . TONSILLECTOMY  1959  . TONSILLECTOMY    . 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   Procedure: TOTAL KNEE ARTHROPLASTY;  Surgeon: Earlie Server, MD;  Location: [facility];  Service: Orthopedics;  Laterality: Right;  . UMBILICAL HERNIA REPAIR  2002   Social History:   reports that he quit smoking about 36 years ago. He has a 15.00 pack-year smoking history. He quit smokeless tobacco use  about 31 years ago. He reports current alcohol use of about 6.0 - 7.0 standard drinks of alcohol per week. He reports that he does not use drugs.  Family History  Problem Relation Age of Onset  . COPD Mother   . Aortic aneurysm Father     Medications: Patient's Medications  New Prescriptions   No medications on file  Previous Medications   ACETAMINOPHEN (TYLENOL) 325 MG TABLET    Take 2 tablets (650 mg total) by mouth every 4 (four) hours as needed.   ACYCLOVIR (ZOVIRAX) 400 MG TABLET    Take 1 tablet (400 mg total) by mouth daily.   AMLODIPINE (NORVASC) 5 MG TABLET    TAKE 1 TABLET MOUTH DAILY TO CONTROL BLOOD PRESSURE   ATORVASTATIN (LIPITOR) 20 MG TABLET    Take 1 tablet (20 mg total) by mouth at bedtime.   DOCUSATE SODIUM (COLACE) 100 MG CAPSULE    Take 1 capsule (100 mg total) by mouth daily as needed.   FEBUXOSTAT (ULORIC) 40 MG TABLET    Take 1 tablet (40 mg total) by mouth daily.   GABAPENTIN (NEURONTIN) 300 MG CAPSULE    Take 1 capsule (300 mg total) by mouth 3 (three) times daily for 14 days, THEN 1 capsule (300 mg total) 2 (two) times daily for 3 days, THEN 1 capsule (300 mg  total) daily for 4 days.   GUAIFENESIN (MUCINEX) 600 MG 12 HR TABLET    Take 600-1,200 mg by mouth at bedtime as needed for cough or to loosen phlegm.   LOSARTAN (COZAAR) 100 MG TABLET    TAKE ONE TABLET BY MOUTH ONCE DAILY TO CONTROL BLOOD PRESSURE   MULTIPLE VITAMINS-MINERALS (ANTIOXIDANT PO)    Take 30 mLs by mouth daily. Aronia Berry Juice    OMEPRAZOLE (PRILOSEC) 20 MG CAPSULE    Take 20 mg by mouth daily.   OXYCODONE (OXY IR/ROXICODONE) 5 MG IMMEDIATE RELEASE TABLET    Take one tab po q4-6hrs prn pain, may need 1-2 first couple weeks   TADALAFIL (CIALIS) 20 MG TABLET    Take 1 tablet (20 mg total) by mouth daily as needed for erectile dysfunction.   ZOLPIDEM (AMBIEN) 5 MG TABLET    Take one tablet by mouth at bedtime as needed for sleep  Modified Medications   No medications on file  Discontinued  Medications   No medications on file    Physical Exam:  There were no vitals filed for this visit. There is no height or weight on file to calculate BMI. Wt Readings from Last 3 Encounters:  09/25/19 222 lb (100.7 kg)  09/17/19 222 lb (100.7 kg)  09/15/19 220 lb 6.4 oz (100 kg)    Physical Exam Constitutional:      Appearance: Normal appearance.  HENT:     Head: Normocephalic and atraumatic.  Skin:    General: Skin is warm and dry.  Neurological:     General: No focal deficit present.     Mental Status: He is alert and oriented to person, place, and time.  Psychiatric:        Mood and Affect: Mood normal.        Behavior: Behavior normal.     Labs reviewed: Basic Metabolic Panel: Recent Labs    08/13/19 0840 09/17/19 1158 09/26/19 0254  NA 139 138 136  K 4.6 4.5 4.1  CL 104 105 107  CO2 25 25 20*  GLUCOSE 100* 110* 146*  BUN 13 12 16   CREATININE 0.75 0.81 0.75  CALCIUM 9.7 9.6 8.8*   Liver Function Tests: Recent Labs    01/20/19 0842 08/13/19 0840 09/17/19 1158  AST 22 26 56*  ALT 46 41 69*  ALKPHOS  --   --  97  BILITOT 0.4 0.5 0.9  PROT 6.7 7.0 7.5  ALBUMIN  --   --  4.5   No results for input(s): LIPASE, AMYLASE in the last 8760 hours. No results for input(s): AMMONIA in the last 8760 hours. CBC: Recent Labs    01/20/19 0842 08/13/19 0840 09/17/19 1158 09/26/19 0254  WBC 8.9 8.0 8.9 17.2*  NEUTROABS 3,836 2,544 3.5  --   HGB 15.5 15.3 15.4 13.0  HCT 43.2 43.7 45.6 38.0*  MCV 93.1 95.2 98.3 97.2  PLT 272 269 231 304   Lipid Panel: Recent Labs    01/20/19 0842 08/13/19 0840  CHOL 187 185  HDL 51 48  LDLCALC 111* 105*  TRIG 139 202*  CHOLHDL 3.7 3.9   TSH: No results for input(s): TSH in the last 8760 hours. A1C: Lab Results  Component Value Date   HGBA1C 5.5 01/20/2019     Assessment/Plan 1. Insomnia, unspecified type -continues on ambien as needed, reports he has not needed a lot recently and trying to wean off.    -encourage to stop drinking evening cocktail or wine  as alcohol can effect melatonin and sleep.  Can try OTC melatonin 1-3 mg by mouth even evening if needed.   Next appt: 3 month for routine follow up.  Carlos American. [place], [facility] [phone]

## 2020-01-13 DIAGNOSIS — R262 Difficulty in walking, not elsewhere classified: Secondary | ICD-10-CM | POA: Diagnosis not present

## 2020-01-13 DIAGNOSIS — M6281 Muscle weakness (generalized): Secondary | ICD-10-CM | POA: Diagnosis not present

## 2020-01-13 DIAGNOSIS — M25661 Stiffness of right knee, not elsewhere classified: Secondary | ICD-10-CM | POA: Diagnosis not present

## 2020-01-13 DIAGNOSIS — M1711 Unilateral primary osteoarthritis, right knee: Secondary | ICD-10-CM | POA: Diagnosis not present

## 2020-01-19 DIAGNOSIS — G4730 Sleep apnea, unspecified: Secondary | ICD-10-CM | POA: Diagnosis not present

## 2020-01-22 ENCOUNTER — Ambulatory Visit: Payer: Medicare HMO | Attending: Internal Medicine

## 2020-01-22 DIAGNOSIS — Z23 Encounter for immunization: Secondary | ICD-10-CM | POA: Insufficient documentation

## 2020-01-22 NOTE — Progress Notes (Signed)
   Covid-19 Vaccination Clinic  Name:  Rodney Foster    MRN: [id number] DOB: [birth year]  01/22/2020  [name] was observed post Covid-19 immunization for 15 minutes without incidence. He was provided with Vaccine Information Sheet and instruction to access the V-Safe system.   [name] was instructed to call 911 with any severe reactions post vaccine: Marland Kitchen Difficulty breathing  . Swelling of your face and throat  . A fast heartbeat  . A bad rash all over your body  . Dizziness and weakness    Immunizations Administered    Name Date Dose VIS Date Route   Pfizer COVID-19 Vaccine 01/22/2020 10:53 AM 0.3 mL 12/11/2019 Intramuscular   Manufacturer: [place]   Lot: [id number]   [place]: [id number]

## 2020-02-10 ENCOUNTER — Other Ambulatory Visit: Payer: Self-pay | Admitting: *Deleted

## 2020-02-10 DIAGNOSIS — N529 Male erectile dysfunction, unspecified: Secondary | ICD-10-CM

## 2020-02-10 MED ORDER — TADALAFIL 20 MG PO TABS: 20.0000 mg | ORAL_TABLET | Freq: Every day | ORAL | 0 refills | Status: DC | PRN

## 2020-02-10 NOTE — Telephone Encounter (Signed)
Patient requested refill. Faxed.  

## 2020-02-11 ENCOUNTER — Other Ambulatory Visit: Payer: Self-pay | Admitting: Nurse Practitioner

## 2020-02-12 ENCOUNTER — Ambulatory Visit: Payer: Medicare HMO | Attending: Internal Medicine

## 2020-02-12 DIAGNOSIS — Z23 Encounter for immunization: Secondary | ICD-10-CM

## 2020-02-12 NOTE — Progress Notes (Signed)
   Covid-19 Vaccination Clinic  Name:  Rodney Foster    MRN: [id number] DOB: 13-Sep-1950  02/12/2020  Rodney Foster was observed post Covid-19 immunization for 15 minutes without incidence. He was provided with Vaccine Information Sheet and instruction to access the V-Safe system.   Rodney Foster was instructed to call 911 with any severe reactions post vaccine: Marland Kitchen Difficulty breathing  . Swelling of your face and throat  . A fast heartbeat  . A bad rash all over your body  . Dizziness and weakness    Immunizations Administered    Name Date Dose VIS Date Route   Pfizer COVID-19 Vaccine 02/12/2020  2:28 PM 0.3 mL 12/11/2019 Intramuscular   Manufacturer: [place]   Lot: [id number]   [place]: [id number]

## 2020-02-19 DIAGNOSIS — G4730 Sleep apnea, unspecified: Secondary | ICD-10-CM | POA: Diagnosis not present

## 2020-03-09 ENCOUNTER — Other Ambulatory Visit: Payer: Self-pay | Admitting: *Deleted

## 2020-03-09 DIAGNOSIS — M1A9XX0 Chronic gout, unspecified, without tophus (tophi): Secondary | ICD-10-CM

## 2020-03-09 MED ORDER — FEBUXOSTAT 40 MG PO TABS: 40.0000 mg | ORAL_TABLET | Freq: Every day | ORAL | 1 refills | Status: DC

## 2020-03-09 NOTE — Telephone Encounter (Signed)
Patient called requesting refill on [place]. Patient gets rx filled at [facility] [phone] Fax [phone]  Printed and placed in [place] folder to sign.

## 2020-03-14 ENCOUNTER — Other Ambulatory Visit: Payer: Self-pay | Admitting: Nurse Practitioner

## 2020-03-18 DIAGNOSIS — G4730 Sleep apnea, unspecified: Secondary | ICD-10-CM | POA: Diagnosis not present

## 2020-03-21 DIAGNOSIS — G4733 Obstructive sleep apnea (adult) (pediatric): Secondary | ICD-10-CM | POA: Diagnosis not present

## 2020-03-21 DIAGNOSIS — G4730 Sleep apnea, unspecified: Secondary | ICD-10-CM | POA: Diagnosis not present

## 2020-04-07 DIAGNOSIS — M25562 Pain in left knee: Secondary | ICD-10-CM | POA: Diagnosis not present

## 2020-04-11 ENCOUNTER — Other Ambulatory Visit: Payer: Self-pay

## 2020-04-11 ENCOUNTER — Ambulatory Visit (INDEPENDENT_AMBULATORY_CARE_PROVIDER_SITE_OTHER): Payer: Medicare HMO | Admitting: Nurse Practitioner

## 2020-04-11 ENCOUNTER — Encounter: Payer: Self-pay | Admitting: Nurse Practitioner

## 2020-04-11 VITALS — BP 126/78 | HR 86 | Temp 97.5°F | Ht 70.0 in | Wt 225.0 lb

## 2020-04-11 DIAGNOSIS — E785 Hyperlipidemia, unspecified: Secondary | ICD-10-CM | POA: Diagnosis not present

## 2020-04-11 DIAGNOSIS — R739 Hyperglycemia, unspecified: Secondary | ICD-10-CM

## 2020-04-11 DIAGNOSIS — I10 Essential (primary) hypertension: Secondary | ICD-10-CM | POA: Diagnosis not present

## 2020-04-11 DIAGNOSIS — J410 Simple chronic bronchitis: Secondary | ICD-10-CM

## 2020-04-11 DIAGNOSIS — M170 Bilateral primary osteoarthritis of knee: Secondary | ICD-10-CM | POA: Diagnosis not present

## 2020-04-11 DIAGNOSIS — G4700 Insomnia, unspecified: Secondary | ICD-10-CM

## 2020-04-11 DIAGNOSIS — B009 Herpesviral infection, unspecified: Secondary | ICD-10-CM

## 2020-04-11 DIAGNOSIS — M1A9XX0 Chronic gout, unspecified, without tophus (tophi): Secondary | ICD-10-CM | POA: Diagnosis not present

## 2020-04-11 MED ORDER — LOSARTAN POTASSIUM 100 MG PO TABS: ORAL_TABLET | ORAL | 1 refills | Status: DC

## 2020-04-11 NOTE — Progress Notes (Signed)
Careteam: Patient Care Team: [name], NP as PCP - General (Geriatric Medicine)  PLACE OF SERVICE:  [place] Directive information Does Patient Have a Medical Advance Directive?: Yes, Type of Advance Directive: Living will, Does patient want to make changes to medical advance directive?: No - Patient declined  No Known Allergies  Chief Complaint  Patient presents with  . Medical Management of Chronic Issues    3 month follow-up   . FYI    Patient will have a left knee replacement on 05/06/2020, [facility].   . Medication Refill    Renew Losartan- [facility]   . Medication Management    Forgets to take Lipitor most of the time      HPI: Patient is a 70 y.o. male for routine follow up.   Having left knee replacement on may 7th, right knee has been doing so well but then left started to hurt. Reports he is wanting to travel and walk more but unable. Unable to exercise due to throbbing, even non weight bearing  Insomnia- doing well, not needing Ambien much but likes to have it if needed. Not as stressed.   GERD- controlled on omeprazole.    Gout- no recent flares on uloric  Hyperlipidemia- not remembering to take at night, leaves in drawer.   Chronic bronchitis/asthma- well controlled. Using twice daily mucinex due to pollen season  htn- well controlled on norvasc and losartan    Review of Systems:  Review of Systems  Constitutional: Negative for chills, fever and weight loss.  HENT: Negative for tinnitus.   Respiratory: Negative for cough, sputum production and shortness of breath.   Cardiovascular: Negative for chest pain, palpitations and leg swelling.  Gastrointestinal: Negative for abdominal pain, constipation, diarrhea and heartburn.  Genitourinary: Negative for dysuria, frequency and urgency.  Musculoskeletal: Positive for joint pain (left knee). Negative for back pain, falls and myalgias.  Skin: Negative.   Neurological:  Negative for dizziness and headaches.  Endo/Heme/Allergies: Positive for environmental allergies.  Psychiatric/Behavioral: Negative for depression and memory loss. The patient does not have insomnia.     Past Medical History:  Diagnosis Date  . Backache, unspecified   . Bronchitis   . Cough variant asthma 05/03/2017  . Genital herpes   . Gout   . Hearing loss   . Hemorrhoids   . Hernia   . Herpes simplex   . Hyperglycemia   . Hyperlipidemia   . Hypertension   . Impotence   . Insomnia, unspecified   . Osteoarthrosis, unspecified whether generalized or localized, unspecified site   . Tinea pedis, right   . Unspecified disorder of male genital organs    Past Surgical History:  Procedure Laterality Date  . fracture left femur  1984  . ganglion repair of left forarm  2000  . IVP  61   ML old comp fracture  . KNEE SURGERY Right 01/21/2019   Raliegh Ip   . NASAL SEPTUM SURGERY  1997  . TONSILLECTOMY  1959  . TONSILLECTOMY    . 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   Procedure: TOTAL KNEE ARTHROPLASTY;  Surgeon: Earlie Server, MD;  Location: [facility];  Service: Orthopedics;  Laterality: Right;  . UMBILICAL HERNIA REPAIR  2002   Social History:   reports that he quit smoking about 36 years ago. He has a 15.00 pack-year smoking history. He quit smokeless tobacco use about 31 years ago. He reports current alcohol use of about 6.0 -  7.0 standard drinks of alcohol per week. He reports that he does not use drugs.  Family History  Problem Relation Age of Onset  . COPD Mother   . Aortic aneurysm Father     Medications: Patient's Medications  New Prescriptions   No medications on file  Previous Medications   ACYCLOVIR (ZOVIRAX) 400 MG TABLET    TAKE 1 TABLET BY MOUTH EVERY DAY   AMLODIPINE (NORVASC) 5 MG TABLET    TAKE 1 TABLET MOUTH DAILY TO CONTROL BLOOD PRESSURE   ATORVASTATIN (LIPITOR) 20 MG TABLET    Take 1 tablet (20 mg total) by mouth at bedtime.   FEBUXOSTAT  (ULORIC) 40 MG TABLET    Take 1 tablet (40 mg total) by mouth daily.   GUAIFENESIN (MUCINEX) 600 MG 12 HR TABLET    Take 600-1,200 mg by mouth at bedtime as needed for cough or to loosen phlegm.   LOSARTAN (COZAAR) 100 MG TABLET    TAKE ONE TABLET BY MOUTH ONCE DAILY TO CONTROL BLOOD PRESSURE   MULTIPLE VITAMINS-MINERALS (ANTIOXIDANT PO)    Take 30 mLs by mouth daily. Aronia Berry Juice    OMEPRAZOLE (PRILOSEC) 20 MG CAPSULE    Take 20 mg by mouth daily.   OXYCODONE (OXY IR/ROXICODONE) 5 MG IMMEDIATE RELEASE TABLET    Take one tab po q4-6hrs prn pain, may need 1-2 first couple weeks   TADALAFIL (CIALIS) 20 MG TABLET    Take 1 tablet (20 mg total) by mouth daily as needed for erectile dysfunction.   ZOLPIDEM (AMBIEN) 5 MG TABLET    Take one tablet by mouth at bedtime as needed for sleep  Modified Medications   No medications on file  Discontinued Medications   No medications on file    Physical Exam:  Vitals:   04/11/20 1005  BP: 126/78  Pulse: 86  Temp: (!) 97.5 F (36.4 C)  TempSrc: Temporal  SpO2: 96%  Weight: 225 lb (102.1 kg)  Height: 5\' 10"  (1.778 m)   Body mass index is 32.28 kg/m. Wt Readings from Last 3 Encounters:  04/11/20 225 lb (102.1 kg)  01/11/20 224 lb (101.6 kg)  09/25/19 222 lb (100.7 kg)    Physical Exam Constitutional:      General: He is not in acute distress.    Appearance: He is well-developed. He is not diaphoretic.  HENT:     Head: Normocephalic and atraumatic.     Mouth/Throat:     Pharynx: No oropharyngeal exudate.  Eyes:     Conjunctiva/sclera: Conjunctivae normal.     Pupils: Pupils are equal, round, and reactive to light.  Cardiovascular:     Rate and Rhythm: Normal rate and regular rhythm.     Heart sounds: Normal heart sounds.  Pulmonary:     Effort: Pulmonary effort is normal.     Breath sounds: Normal breath sounds.  Abdominal:     General: Bowel sounds are normal.     Palpations: Abdomen is soft.  Musculoskeletal:         General: No swelling or tenderness.     Cervical back: Normal range of motion and neck supple.  Skin:    General: Skin is warm and dry.  Neurological:     Mental Status: He is alert and oriented to person, place, and time.  Psychiatric:        Mood and Affect: Mood normal.        Behavior: Behavior normal.     Labs reviewed: Basic Metabolic  Panel: Recent Labs    08/13/19 0840 09/17/19 1158 09/26/19 0254  NA 139 138 136  K 4.6 4.5 4.1  CL 104 105 107  CO2 25 25 20*  GLUCOSE 100* 110* 146*  BUN 13 12 16   CREATININE 0.75 0.81 0.75  CALCIUM 9.7 9.6 8.8*   Liver Function Tests: Recent Labs    08/13/19 0840 09/17/19 1158  AST 26 56*  ALT 41 69*  ALKPHOS  --  97  BILITOT 0.5 0.9  PROT 7.0 7.5  ALBUMIN  --  4.5   No results for input(s): LIPASE, AMYLASE in the last 8760 hours. No results for input(s): AMMONIA in the last 8760 hours. CBC: Recent Labs    08/13/19 0840 09/17/19 1158 09/26/19 0254  WBC 8.0 8.9 17.2*  NEUTROABS 2,544 3.5  --   HGB 15.3 15.4 13.0  HCT 43.7 45.6 38.0*  MCV 95.2 98.3 97.2  PLT 269 231 304   Lipid Panel: Recent Labs    08/13/19 0840  CHOL 185  HDL 48  LDLCALC 105*  TRIG 202*  CHOLHDL 3.9   TSH: No results for input(s): TSH in the last 8760 hours. A1C: Lab Results  Component Value Date   HGBA1C 5.5 01/20/2019     Assessment/Plan 1. Simple chronic bronchitis ([place]) Doing well at this time. No increase in cough, congestion or shortness of breath.   2. Essential hypertension -stable on current regimen with lifestyle modifications. - losartan (COZAAR) 100 MG tablet; TAKE ONE TABLET BY MOUTH ONCE DAILY TO CONTROL BLOOD PRESSURE  Dispense: 90 tablet; Refill: 1 - COMPLETE METABOLIC PANEL WITH GFR - CBC with Differential/Platelet  3. Chronic gout without tophus, unspecified cause, unspecified site No recent flares, continues on uloric   4. Hyperlipidemia, unspecified hyperlipidemia type -not fasting today, has not been  taking lipitor routinely because it is his only evening medication, okay to take during the day with other medications if this improves compliance.  -will follow up fasting lipids prior to appt.  5. Hyperglycemia -noted on previous lab, not fasting today, will follow up  6. Herpes simplex -no recent flares  7. Insomnia, unspecified type -improved, rarely uses Ambien   8. Primary osteoarthritis of both knees -doing well with right knee replacement but now left with increase in pain and has replacement scheduled for this.  - COMPLETE METABOLIC PANEL WITH GFR - CBC with Differential/Platelet  Next appt: 2 weeks for MOST form 6 months for routine follow up, labs prior Emeline Simpson K. [place], [facility] [phone]

## 2020-04-11 NOTE — Patient Instructions (Signed)
Follow up in 2 weeks via virtual visit for MOST form completion- can be anyday

## 2020-04-12 LAB — CBC WITH DIFFERENTIAL/PLATELET
Absolute Monocytes: 880 cells/uL (ref 200–950)
Basophils Absolute: 141 cells/uL (ref 0–200)
Basophils Relative: 1.7 %
Eosinophils Absolute: 125 cells/uL (ref 15–500)
Eosinophils Relative: 1.5 %
HCT: 44.2 % (ref 38.5–50.0)
Hemoglobin: 15.4 g/dL (ref 13.2–17.1)
Lymphs Abs: 3743 cells/uL (ref 850–3900)
MCH: 33.1 pg — ABNORMAL HIGH (ref 27.0–33.0)
MCHC: 34.8 g/dL (ref 32.0–36.0)
MCV: 95.1 fL (ref 80.0–100.0)
MPV: 10.7 fL (ref 7.5–12.5)
Monocytes Relative: 10.6 %
Neutro Abs: 3411 cells/uL (ref 1500–7800)
Neutrophils Relative %: 41.1 %
Platelets: 265 10*3/uL (ref 140–400)
RBC: 4.65 10*6/uL (ref 4.20–5.80)
RDW: 12.7 % (ref 11.0–15.0)
Total Lymphocyte: 45.1 %
WBC: 8.3 10*3/uL (ref 3.8–10.8)

## 2020-04-12 LAB — COMPLETE METABOLIC PANEL WITH GFR
AG Ratio: 2 (calc) (ref 1.0–2.5)
ALT: 53 U/L — ABNORMAL HIGH (ref 9–46)
AST: 35 U/L (ref 10–35)
Albumin: 4.3 g/dL (ref 3.6–5.1)
Alkaline phosphatase (APISO): 93 U/L (ref 35–144)
BUN: 16 mg/dL (ref 7–25)
CO2: 24 mmol/L (ref 20–32)
Calcium: 9.7 mg/dL (ref 8.6–10.3)
Chloride: 102 mmol/L (ref 98–110)
Creat: 0.89 mg/dL (ref 0.70–1.18)
GFR, Est African American: 100 mL/min/{1.73_m2} (ref >=60)
GFR, Est Non African American: 86 mL/min/{1.73_m2} (ref >=60)
Globulin: 2.2 g/dL (calc) (ref 1.9–3.7)
Glucose, Bld: 163 mg/dL — ABNORMAL HIGH (ref 65–99)
Potassium: 4.5 mmol/L (ref 3.5–5.3)
Sodium: 135 mmol/L (ref 135–146)
Total Bilirubin: 0.5 mg/dL (ref 0.2–1.2)
Total Protein: 6.5 g/dL (ref 6.1–8.1)

## 2020-04-14 ENCOUNTER — Ambulatory Visit: Payer: Self-pay | Admitting: Physician Assistant

## 2020-04-14 DIAGNOSIS — M1712 Unilateral primary osteoarthritis, left knee: Secondary | ICD-10-CM | POA: Diagnosis not present

## 2020-04-14 NOTE — H&P (Signed)
TOTAL KNEE ADMISSION H&P  Patient is being admitted for left total knee arthroplasty.  Subjective:  Chief Complaint:left knee pain.  HPI: Rodney Foster, 70 y.o. male, has a history of pain and functional disability in the left knee due to arthritis and has failed non-surgical conservative treatments for greater than 12 weeks to includeNSAID's and/or analgesics, corticosteriod injections, use of assistive devices and activity modification.  Onset of symptoms was gradual, starting 6 years ago with gradually worsening course since that time. The patient noted no past surgery on the left knee(s).  Patient currently rates pain in the left knee(s) at 8 out of 10 with activity. Patient has night pain, worsening of pain with activity and weight bearing, pain that interferes with activities of daily living, pain with passive range of motion, crepitus and joint swelling.  Patient has evidence of periarticular osteophytes and joint space narrowing by imaging studies.  There is no active infection.  Patient Active Problem List   Diagnosis Date Noted  . Primary localized osteoarthritis of right knee 09/25/2019  . Cough variant asthma 05/03/2017  . Abnormal CXR 05/03/2017  . Sinus congestion 04/10/2017  . Chronic bronchitis ([place]) 04/10/2017  . PVC (premature ventricular contraction) 10/10/2016  . Obese 03/28/2016  . Pain in joint, ankle and foot 06/01/2015  . Tinea unguium 03/15/2015  . Vitreous floater 03/15/2015  . Herpes simplex   . Hyperlipidemia   . Gout   . Hypertension   . Hyperglycemia   . Insomnia   . Impotence    Past Medical History:  Diagnosis Date  . Backache, unspecified   . Bronchitis   . Cough variant asthma 05/03/2017  . Genital herpes   . Gout   . Hearing loss   . Hemorrhoids   . Hernia   . Herpes simplex   . Hyperglycemia   . Hyperlipidemia   . Hypertension   . Impotence   . Insomnia, unspecified   . Osteoarthrosis, unspecified whether generalized or localized,  unspecified site   . Tinea pedis, right   . Unspecified disorder of male genital organs     Past Surgical History:  Procedure Laterality Date  . fracture left femur  1984  . ganglion repair of left forarm  2000  . IVP  55   ML old comp fracture  . KNEE SURGERY Right 01/21/2019   Raliegh Ip   . NASAL SEPTUM SURGERY  1997  . TONSILLECTOMY  1959  . TONSILLECTOMY    . 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   Procedure: TOTAL KNEE ARTHROPLASTY;  Surgeon: Earlie Server, MD;  Location: [facility];  Service: Orthopedics;  Laterality: Right;  . UMBILICAL HERNIA REPAIR  2002    Current Outpatient Medications  Medication Sig Dispense Refill Last Dose  . acyclovir (ZOVIRAX) 400 MG tablet TAKE 1 TABLET BY MOUTH EVERY DAY 90 tablet 0   . amLODipine (NORVASC) 5 MG tablet TAKE 1 TABLET MOUTH DAILY TO CONTROL BLOOD PRESSURE (Patient taking differently: Take 5 mg by mouth daily. TAKE 1 TABLET MOUTH DAILY TO CONTROL BLOOD PRESSURE) 90 tablet 1   . atorvastatin (LIPITOR) 20 MG tablet Take 1 tablet (20 mg total) by mouth at bedtime. 90 tablet 1   . febuxostat (ULORIC) 40 MG tablet Take 1 tablet (40 mg total) by mouth daily. 90 tablet 1   . guaiFENesin (MUCINEX) 600 MG 12 hr tablet Take 600-1,200 mg by mouth at bedtime as needed for cough or to loosen phlegm.     Marland Kitchen losartan (COZAAR)  100 MG tablet TAKE ONE TABLET BY MOUTH ONCE DAILY TO CONTROL BLOOD PRESSURE 90 tablet 1   . Multiple Vitamins-Minerals (ANTIOXIDANT PO) Take 30 mLs by mouth daily. Aronia Berry Juice      . omeprazole (PRILOSEC) 20 MG capsule Take 20 mg by mouth daily.     . tadalafil (CIALIS) 20 MG tablet Take 1 tablet (20 mg total) by mouth daily as needed for erectile dysfunction. 10 tablet 0   . zolpidem (AMBIEN) 5 MG tablet Take one tablet by mouth at bedtime as needed for sleep (Patient taking differently: Take by mouth at bedtime as needed for sleep. Take one tablet by mouth at bedtime as needed for sleep) 30 tablet 0    No current  facility-administered medications for this visit.   No Known Allergies  Social History   Tobacco Use  . Smoking status: Former Smoker    Packs/day: 1.00    Years: 15.00    Pack years: 15.00    Quit date: 01/01/1984    Years since quitting: 36.3  . Smokeless tobacco: Former Systems developer    Quit date: 1990  Substance Use Topics  . Alcohol use: Yes    Alcohol/week: 6.0 - 7.0 standard drinks    Types: 6 - 7 Glasses of wine per week    Comment: 3-4 a night of wine or coctail    Family History  Problem Relation Age of Onset  . COPD Mother   . Aortic aneurysm Father      Review of Systems  Gastrointestinal: Positive for blood in stool.  Musculoskeletal: Positive for arthralgias.  Hematological: Bruises/bleeds easily.  All other systems reviewed and are negative.   Objective:  Physical Exam  Constitutional: He is oriented to person, place, and time. He appears well-developed and well-nourished. No distress.  HENT:  Head: Normocephalic and atraumatic.  Eyes: Pupils are equal, round, and reactive to light. Conjunctivae and EOM are normal.  Cardiovascular: Normal rate, regular rhythm, normal heart sounds and intact distal pulses.  Respiratory: Effort normal and breath sounds normal. No respiratory distress. He has no wheezes.  GI: Soft. Bowel sounds are normal. He exhibits no distension. There is no abdominal tenderness.  Musculoskeletal:     Cervical back: Normal range of motion and neck supple.     Left knee: Swelling and bony tenderness present. No effusion or erythema. Normal range of motion. Tenderness present over the medial joint line.  Lymphadenopathy:    He has no cervical adenopathy.  Neurological: He is alert and oriented to person, place, and time.  Skin: Skin is warm and dry. No rash noted. No erythema.  Psychiatric: He has a normal mood and affect. His behavior is normal.    Vital signs in last 24 hours: @VSRANGES @  Labs:   Estimated body mass index is 32.28 kg/m  as calculated from the following:   Height as of 04/11/20: 5\' 10"  (1.778 m).   Weight as of 04/11/20: 102.1 kg.   Imaging Review Plain radiographs demonstrate moderate degenerative joint disease of the left knee(s). The overall alignment ismild varus. The bone quality appears to be good for age and reported activity level.      Assessment/Plan:  End stage arthritis, left knee   The patient history, physical examination, clinical judgment of the provider and imaging studies are consistent with end stage degenerative joint disease of the left knee(s) and total knee arthroplasty is deemed medically necessary. The treatment options including medical management, injection therapy arthroscopy and arthroplasty were  discussed at length. The risks and benefits of total knee arthroplasty were presented and reviewed. The risks due to aseptic loosening, infection, stiffness, patella tracking problems, thromboembolic complications and other imponderables were discussed. The patient acknowledged the explanation, agreed to proceed with the plan and consent was signed. Patient is being admitted for inpatient treatment for surgery, pain control, PT, OT, prophylactic antibiotics, VTE prophylaxis, progressive ambulation and ADL's and discharge planning. The patient is planning to be discharged home with home health services    Anticipated LOS equal to or greater than 2 midnights due to - Age 21 and older with one or more of the following:  - Obesity  - Expected need for hospital services (PT, OT, Nursing) required for safe  discharge  - Anticipated need for postoperative skilled nursing care or inpatient rehab  - Active co-morbidities: None OR   - Unanticipated findings during/Post Surgery: None  - Patient is a high risk of re-admission due to: None

## 2020-04-14 NOTE — H&P (View-Only) (Signed)
TOTAL KNEE ADMISSION H&P  Patient is being admitted for left total knee arthroplasty.  Subjective:  Chief Complaint:left knee pain.  HPI: Rodney Foster, 70 y.o. male, has a history of pain and functional disability in the left knee due to arthritis and has failed non-surgical conservative treatments for greater than 12 weeks to includeNSAID's and/or analgesics, corticosteriod injections, use of assistive devices and activity modification.  Onset of symptoms was gradual, starting 6 years ago with gradually worsening course since that time. The patient noted no past surgery on the left knee(s).  Patient currently rates pain in the left knee(s) at 8 out of 10 with activity. Patient has night pain, worsening of pain with activity and weight bearing, pain that interferes with activities of daily living, pain with passive range of motion, crepitus and joint swelling.  Patient has evidence of periarticular osteophytes and joint space narrowing by imaging studies.  There is no active infection.  Patient Active Problem List   Diagnosis Date Noted  . Primary localized osteoarthritis of right knee 09/25/2019  . Cough variant asthma 05/03/2017  . Abnormal CXR 05/03/2017  . Sinus congestion 04/10/2017  . Chronic bronchitis ([place]) 04/10/2017  . PVC (premature ventricular contraction) 10/10/2016  . Obese 03/28/2016  . Pain in joint, ankle and foot 06/01/2015  . Tinea unguium 03/15/2015  . Vitreous floater 03/15/2015  . Herpes simplex   . Hyperlipidemia   . Gout   . Hypertension   . Hyperglycemia   . Insomnia   . Impotence    Past Medical History:  Diagnosis Date  . Backache, unspecified   . Bronchitis   . Cough variant asthma 05/03/2017  . Genital herpes   . Gout   . Hearing loss   . Hemorrhoids   . Hernia   . Herpes simplex   . Hyperglycemia   . Hyperlipidemia   . Hypertension   . Impotence   . Insomnia, unspecified   . Osteoarthrosis, unspecified whether generalized or localized,  unspecified site   . Tinea pedis, right   . Unspecified disorder of male genital organs     Past Surgical History:  Procedure Laterality Date  . fracture left femur  1984  . ganglion repair of left forarm  2000  . IVP  19   ML old comp fracture  . KNEE SURGERY Right 01/21/2019   Raliegh Ip   . NASAL SEPTUM SURGERY  1997  . TONSILLECTOMY  1959  . TONSILLECTOMY    . 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   Procedure: TOTAL KNEE ARTHROPLASTY;  Surgeon: Earlie Server, MD;  Location: [facility];  Service: Orthopedics;  Laterality: Right;  . UMBILICAL HERNIA REPAIR  2002    Current Outpatient Medications  Medication Sig Dispense Refill Last Dose  . acyclovir (ZOVIRAX) 400 MG tablet TAKE 1 TABLET BY MOUTH EVERY DAY 90 tablet 0   . amLODipine (NORVASC) 5 MG tablet TAKE 1 TABLET MOUTH DAILY TO CONTROL BLOOD PRESSURE (Patient taking differently: Take 5 mg by mouth daily. TAKE 1 TABLET MOUTH DAILY TO CONTROL BLOOD PRESSURE) 90 tablet 1   . atorvastatin (LIPITOR) 20 MG tablet Take 1 tablet (20 mg total) by mouth at bedtime. 90 tablet 1   . febuxostat (ULORIC) 40 MG tablet Take 1 tablet (40 mg total) by mouth daily. 90 tablet 1   . guaiFENesin (MUCINEX) 600 MG 12 hr tablet Take 600-1,200 mg by mouth at bedtime as needed for cough or to loosen phlegm.     Marland Kitchen losartan (COZAAR)  100 MG tablet TAKE ONE TABLET BY MOUTH ONCE DAILY TO CONTROL BLOOD PRESSURE 90 tablet 1   . Multiple Vitamins-Minerals (ANTIOXIDANT PO) Take 30 mLs by mouth daily. Aronia Berry Juice      . omeprazole (PRILOSEC) 20 MG capsule Take 20 mg by mouth daily.     . tadalafil (CIALIS) 20 MG tablet Take 1 tablet (20 mg total) by mouth daily as needed for erectile dysfunction. 10 tablet 0   . zolpidem (AMBIEN) 5 MG tablet Take one tablet by mouth at bedtime as needed for sleep (Patient taking differently: Take by mouth at bedtime as needed for sleep. Take one tablet by mouth at bedtime as needed for sleep) 30 tablet 0    No current  facility-administered medications for this visit.   No Known Allergies  Social History   Tobacco Use  . Smoking status: Former Smoker    Packs/day: 1.00    Years: 15.00    Pack years: 15.00    Quit date: 01/01/1984    Years since quitting: 36.3  . Smokeless tobacco: Former Systems developer    Quit date: 1990  Substance Use Topics  . Alcohol use: Yes    Alcohol/week: 6.0 - 7.0 standard drinks    Types: 6 - 7 Glasses of wine per week    Comment: 3-4 a night of wine or coctail    Family History  Problem Relation Age of Onset  . COPD Mother   . Aortic aneurysm Father      Review of Systems  Gastrointestinal: Positive for blood in stool.  Musculoskeletal: Positive for arthralgias.  Hematological: Bruises/bleeds easily.  All other systems reviewed and are negative.   Objective:  Physical Exam  Constitutional: He is oriented to person, place, and time. He appears well-developed and well-nourished. No distress.  HENT:  Head: Normocephalic and atraumatic.  Eyes: Pupils are equal, round, and reactive to light. Conjunctivae and EOM are normal.  Cardiovascular: Normal rate, regular rhythm, normal heart sounds and intact distal pulses.  Respiratory: Effort normal and breath sounds normal. No respiratory distress. He has no wheezes.  GI: Soft. Bowel sounds are normal. He exhibits no distension. There is no abdominal tenderness.  Musculoskeletal:     Cervical back: Normal range of motion and neck supple.     Left knee: Swelling and bony tenderness present. No effusion or erythema. Normal range of motion. Tenderness present over the medial joint line.  Lymphadenopathy:    He has no cervical adenopathy.  Neurological: He is alert and oriented to person, place, and time.  Skin: Skin is warm and dry. No rash noted. No erythema.  Psychiatric: He has a normal mood and affect. His behavior is normal.    Vital signs in last 24 hours: @VSRANGES @  Labs:   Estimated body mass index is 32.28 kg/m  as calculated from the following:   Height as of 04/11/20: 5\' 10"  (1.778 m).   Weight as of 04/11/20: 102.1 kg.   Imaging Review Plain radiographs demonstrate moderate degenerative joint disease of the left knee(s). The overall alignment ismild varus. The bone quality appears to be good for age and reported activity level.      Assessment/Plan:  End stage arthritis, left knee   The patient history, physical examination, clinical judgment of the provider and imaging studies are consistent with end stage degenerative joint disease of the left knee(s) and total knee arthroplasty is deemed medically necessary. The treatment options including medical management, injection therapy arthroscopy and arthroplasty were  discussed at length. The risks and benefits of total knee arthroplasty were presented and reviewed. The risks due to aseptic loosening, infection, stiffness, patella tracking problems, thromboembolic complications and other imponderables were discussed. The patient acknowledged the explanation, agreed to proceed with the plan and consent was signed. Patient is being admitted for inpatient treatment for surgery, pain control, PT, OT, prophylactic antibiotics, VTE prophylaxis, progressive ambulation and ADL's and discharge planning. The patient is planning to be discharged home with home health services    Anticipated LOS equal to or greater than 2 midnights due to - Age 22 and older with one or more of the following:  - Obesity  - Expected need for hospital services (PT, OT, Nursing) required for safe  discharge  - Anticipated need for postoperative skilled nursing care or inpatient rehab  - Active co-morbidities: None OR   - Unanticipated findings during/Post Surgery: None  - Patient is a high risk of re-admission due to: None

## 2020-04-21 NOTE — Progress Notes (Signed)
PCP - [name], NP Cardiologist -   Chest x-ray -  EKG - 08-28-19 Stress Test -  ECHO -  Cardiac Cath -   Sleep Study -  CPAP -   Fasting Blood Sugar -  Checks Blood Sugar _____ times a day  Blood Thinner Instructions: Aspirin Instructions: Last Dose:  Anesthesia review:   Patient denies shortness of breath, fever, cough and chest pain at PAT appointment   Patient verbalized understanding of instructions that were given to them at the PAT appointment. Patient was also instructed that they will need to review over the PAT instructions again at home before surgery.

## 2020-04-21 NOTE — Patient Instructions (Addendum)
DUE TO COVID-19 ONLY ONE VISITOR IS ALLOWED TO COME WITH YOU AND STAY IN THE WAITING ROOM ONLY DURING PRE OP AND PROCEDURE DAY OF SURGERY. THE 1 VISITOR MAY VISIT WITH YOU AFTER SURGERY IN YOUR PRIVATE ROOM DURING VISITING HOURS ONLY!  YOU NEED TO HAVE A COVID 19 TEST ON 05-03-20 @ 3:00 PM, THIS TEST MUST BE DONE BEFORE SURGERY, COME  Rodney Foster, Rodney Foster , [id number].  ([place]) ONCE YOUR COVID TEST IS COMPLETED, PLEASE BEGIN THE QUARANTINE INSTRUCTIONS AS OUTLINED IN YOUR HANDOUT.                Rodney Foster  04/21/2020   Your procedure is scheduled on: 05-06-20   Report to [facility] Main  Entrance    Report to Admitting at 5:30 AM     Call this number if you have problems the morning of surgery [phone]    Remember: AFTER MIDNIGHT THE NIGHT PRIOR TO SURGERY. NOTHING BY MOUTH EXCEPT CLEAR LIQUIDS UNTIL 4:30 AM . PLEASE FINISH ENSURE DRINK PER SURGEON ORDER  WHICH NEEDS TO BE COMPLETED AT 4:30 AM.   CLEAR LIQUID DIET   Foods Allowed                                                                     Foods Excluded  Coffee and tea, regular and decaf                             liquids that you cannot  Plain Jell-O any favor except red or purple                                           see through such as: Fruit ices (not with fruit pulp)                                     milk, soups, orange juice  Iced Popsicles                                    All solid food Carbonated beverages, regular and diet                                    Cranberry, grape and apple juices Sports drinks like Gatorade Lightly seasoned clear broth or consume(fat free) Sugar, honey syrup   _____________________________________________________________________     Take these medicines the morning of surgery with A SIP OF WATER: Amlodipine (Norvasc), Febucostat (Uloric), Omeprazole (Prilosec), and Atorvastatin (Lipitor)  BRUSH YOUR TEETH MORNING OF SURGERY AND  RINSE YOUR MOUTH OUT, NO CHEWING GUM CANDY OR MINTS.                                 You may not have any metal on  your body including hair pins and              piercings     Do not wear jewelry, cologne, lotions, powders or deodorant                    Men may shave face and neck.   Do not bring valuables to the hospital. [place].  Contacts, dentures or bridgework may not be worn into surgery.  You can bring an overnight bag     Special Instructions: N/A              Please read over the following fact sheets you were given: _____________________________________________________________________             [facility] - Preparing for Surgery Before surgery, you can play an important role.  Because skin is not sterile, your skin needs to be as free of germs as possible.  You can reduce the number of germs on your skin by washing with CHG (chlorahexidine gluconate) soap before surgery.  CHG is an antiseptic cleaner which kills germs and bonds with the skin to continue killing germs even after washing. Please DO NOT use if you have an allergy to CHG or antibacterial soaps.  If your skin becomes reddened/irritated stop using the CHG and inform your nurse when you arrive at Short Stay. Do not shave (including legs and underarms) for at least 48 hours prior to the first CHG shower.  You may shave your face/neck. Please follow these instructions carefully:  1.  Shower with CHG Soap the night before surgery and the  morning of Surgery.  2.  If you choose to wash your hair, wash your hair first as usual with your  normal  shampoo.  3.  After you shampoo, rinse your hair and body thoroughly to remove the  shampoo.                           4.  Use CHG as you would any other liquid soap.  You can apply chg directly  to the skin and wash                       Gently with a scrungie or clean washcloth.  5.  Apply the CHG Soap to your body ONLY  FROM THE NECK DOWN.   Do not use on face/ open                           Wound or open sores. Avoid contact with eyes, ears mouth and genitals (private parts).                       Wash face,  Genitals (private parts) with your normal soap.             6.  Wash thoroughly, paying special attention to the area where your surgery  will be performed.  7.  Thoroughly rinse your body with warm water from the neck down.  8.  DO NOT shower/wash with your normal soap after using and rinsing off  the CHG Soap.                9.  Pat yourself dry with a clean towel.  10.  Wear clean pajamas.            11.  Place clean sheets on your bed the night of your first shower and do not  sleep with pets. Day of Surgery : Do not apply any lotions/deodorants the morning of surgery.  Please wear clean clothes to the hospital/surgery center.  FAILURE TO FOLLOW THESE INSTRUCTIONS MAY RESULT IN THE CANCELLATION OF YOUR SURGERY PATIENT SIGNATURE_________________________________  NURSE SIGNATURE__________________________________  ________________________________________________________________________

## 2020-04-25 ENCOUNTER — Other Ambulatory Visit: Payer: Self-pay | Admitting: *Deleted

## 2020-04-25 ENCOUNTER — Encounter (HOSPITAL_COMMUNITY)
Admission: RE | Admit: 2020-04-25 | Discharge: 2020-04-25 | Disposition: A | Discharge status: Home / Self Care | Payer: Medicare HMO | Source: Ambulatory Visit | Attending: Orthopedic Surgery | Admitting: Orthopedic Surgery

## 2020-04-25 ENCOUNTER — Encounter (HOSPITAL_COMMUNITY): Payer: Self-pay

## 2020-04-25 ENCOUNTER — Other Ambulatory Visit: Payer: Self-pay

## 2020-04-25 DIAGNOSIS — Z79899 Other long term (current) drug therapy: Secondary | ICD-10-CM | POA: Insufficient documentation

## 2020-04-25 DIAGNOSIS — I10 Essential (primary) hypertension: Secondary | ICD-10-CM | POA: Diagnosis not present

## 2020-04-25 DIAGNOSIS — Z87891 Personal history of nicotine dependence: Secondary | ICD-10-CM | POA: Insufficient documentation

## 2020-04-25 DIAGNOSIS — Z01812 Encounter for preprocedural laboratory examination: Secondary | ICD-10-CM | POA: Diagnosis not present

## 2020-04-25 DIAGNOSIS — M1712 Unilateral primary osteoarthritis, left knee: Secondary | ICD-10-CM | POA: Diagnosis not present

## 2020-04-25 DIAGNOSIS — E785 Hyperlipidemia, unspecified: Secondary | ICD-10-CM | POA: Diagnosis not present

## 2020-04-25 DIAGNOSIS — E782 Mixed hyperlipidemia: Secondary | ICD-10-CM

## 2020-04-25 LAB — CBC WITH DIFFERENTIAL/PLATELET
Abs Immature Granulocytes: 0.01 10*3/uL (ref 0.00–0.07)
Basophils Absolute: 0.1 10*3/uL (ref 0.0–0.1)
Basophils Relative: 1 %
Eosinophils Absolute: 0.1 10*3/uL (ref 0.0–0.5)
Eosinophils Relative: 1 %
HCT: 46.4 % (ref 39.0–52.0)
Hemoglobin: 15.9 g/dL (ref 13.0–17.0)
Immature Granulocytes: 0 %
Lymphocytes Relative: 37 %
Lymphs Abs: 2.9 10*3/uL (ref 0.7–4.0)
MCH: 32.5 pg (ref 26.0–34.0)
MCHC: 34.3 g/dL (ref 30.0–36.0)
MCV: 94.9 fL (ref 80.0–100.0)
Monocytes Absolute: 0.7 10*3/uL (ref 0.1–1.0)
Monocytes Relative: 9 %
Neutro Abs: 3.9 10*3/uL (ref 1.7–7.7)
Neutrophils Relative %: 52 %
Platelets: 258 10*3/uL (ref 150–400)
RBC: 4.89 MIL/uL (ref 4.22–5.81)
RDW: 12 % (ref 11.5–15.5)
WBC: 7.7 10*3/uL (ref 4.0–10.5)
nRBC: 0 % (ref 0.0–0.2)

## 2020-04-25 LAB — COMPREHENSIVE METABOLIC PANEL
ALT: 56 U/L — ABNORMAL HIGH (ref 0–44)
AST: 55 U/L — ABNORMAL HIGH (ref 15–41)
Albumin: 4.4 g/dL (ref 3.5–5.0)
Alkaline Phosphatase: 93 U/L (ref 38–126)
Anion gap: 9 (ref 5–15)
BUN: 11 mg/dL (ref 8–23)
CO2: 26 mmol/L (ref 22–32)
Calcium: 9.2 mg/dL (ref 8.9–10.3)
Chloride: 104 mmol/L (ref 98–111)
Creatinine, Ser: 0.95 mg/dL (ref 0.61–1.24)
GFR calc Af Amer: >60 mL/min (ref >60)
GFR calc non Af Amer: >60 mL/min (ref >60)
Glucose, Bld: 113 mg/dL — ABNORMAL HIGH (ref 70–99)
Potassium: 4.2 mmol/L (ref 3.5–5.1)
Sodium: 139 mmol/L (ref 135–145)
Total Bilirubin: 1.1 mg/dL (ref 0.3–1.2)
Total Protein: 7.3 g/dL (ref 6.5–8.1)

## 2020-04-25 LAB — TYPE AND SCREEN
ABO/RH(D): A POS
Antibody Screen: NEGATIVE

## 2020-04-25 LAB — SURGICAL PCR SCREEN
MRSA, PCR: NEGATIVE
Staphylococcus aureus: NEGATIVE

## 2020-04-25 MED ORDER — ATORVASTATIN CALCIUM 20 MG PO TABS: 20.0000 mg | ORAL_TABLET | Freq: Every day | ORAL | 1 refills | Status: DC

## 2020-04-25 NOTE — Telephone Encounter (Signed)
Patient requested refill Faxed to pharmacy.  

## 2020-04-26 ENCOUNTER — Other Ambulatory Visit (HOSPITAL_COMMUNITY): Payer: Medicare HMO

## 2020-05-02 NOTE — Care Plan (Signed)
Ortho Bundle Case Management Note  Patient Details  Name: Rodney Foster MRN: [id number] Date of Birth: January 12, 1950    Met with patient in the office prior to surgery. He plans to discharge to home with GF to assist. He will have HHPT from [facility] and go to OPPT at [facility]. He has rolling walker at home and CPM has been ordered. Patient and MD in agreement with this plan. He is aware that he will be discharged to home and needs to make sure his caregiver will be available.  Choice offered.                  DME Arranged:    DME Agency:     HH Arranged:  PT HH Agency:  [facility] ([facility]  Additional Comments: Please contact me with any questions of if this plan should need to change.  Ladell Heads,  [place] [facility]  [phone] 05/02/2020, 2:53 PM

## 2020-05-03 ENCOUNTER — Other Ambulatory Visit (HOSPITAL_COMMUNITY)
Admission: RE | Admit: 2020-05-03 | Discharge: 2020-05-03 | Disposition: A | Discharge status: Home / Self Care | Payer: Medicare HMO | Source: Ambulatory Visit | Attending: Orthopedic Surgery | Admitting: Orthopedic Surgery

## 2020-05-03 DIAGNOSIS — Z01812 Encounter for preprocedural laboratory examination: Secondary | ICD-10-CM | POA: Insufficient documentation

## 2020-05-03 DIAGNOSIS — Z20822 Contact with and (suspected) exposure to covid-19: Secondary | ICD-10-CM | POA: Insufficient documentation

## 2020-05-03 LAB — SARS CORONAVIRUS 2 (TAT 6-24 HRS): SARS Coronavirus 2: NEGATIVE

## 2020-05-05 MED ORDER — TRANEXAMIC ACID 1000 MG/10ML IV SOLN
2000.0000 mg | INTRAVENOUS | Status: DC
  Filled 2020-05-05: qty 20

## 2020-05-05 MED ORDER — BUPIVACAINE LIPOSOME 1.3 % IJ SUSP
20.0000 mL | INTRAMUSCULAR | Status: DC
  Filled 2020-05-05: qty 20

## 2020-05-06 ENCOUNTER — Encounter (HOSPITAL_COMMUNITY)
Admission: RE | Disposition: A | Discharge status: Home / Self Care | Payer: Self-pay | Source: Ambulatory Visit | Attending: Orthopedic Surgery

## 2020-05-06 ENCOUNTER — Ambulatory Visit (HOSPITAL_COMMUNITY): Payer: Medicare HMO | Admitting: Anesthesiology

## 2020-05-06 ENCOUNTER — Other Ambulatory Visit: Payer: Self-pay

## 2020-05-06 ENCOUNTER — Encounter (HOSPITAL_COMMUNITY): Payer: Self-pay | Admitting: Orthopedic Surgery

## 2020-05-06 ENCOUNTER — Ambulatory Visit (HOSPITAL_COMMUNITY)
Admission: RE | Admit: 2020-05-06 | Discharge: 2020-05-07 | Disposition: A | Discharge status: Home / Self Care | Payer: Medicare HMO | Source: Ambulatory Visit | Attending: Orthopedic Surgery | Admitting: Orthopedic Surgery

## 2020-05-06 DIAGNOSIS — Z8719 Personal history of other diseases of the digestive system: Secondary | ICD-10-CM | POA: Insufficient documentation

## 2020-05-06 DIAGNOSIS — Z79899 Other long term (current) drug therapy: Secondary | ICD-10-CM | POA: Diagnosis not present

## 2020-05-06 DIAGNOSIS — G8918 Other acute postprocedural pain: Secondary | ICD-10-CM | POA: Diagnosis not present

## 2020-05-06 DIAGNOSIS — R739 Hyperglycemia, unspecified: Secondary | ICD-10-CM | POA: Insufficient documentation

## 2020-05-06 DIAGNOSIS — I10 Essential (primary) hypertension: Secondary | ICD-10-CM | POA: Diagnosis not present

## 2020-05-06 DIAGNOSIS — G4700 Insomnia, unspecified: Secondary | ICD-10-CM | POA: Insufficient documentation

## 2020-05-06 DIAGNOSIS — M24562 Contracture, left knee: Secondary | ICD-10-CM | POA: Diagnosis not present

## 2020-05-06 DIAGNOSIS — E669 Obesity, unspecified: Secondary | ICD-10-CM | POA: Insufficient documentation

## 2020-05-06 DIAGNOSIS — Z87891 Personal history of nicotine dependence: Secondary | ICD-10-CM | POA: Insufficient documentation

## 2020-05-06 DIAGNOSIS — Z6831 Body mass index (BMI) 31.0-31.9, adult: Secondary | ICD-10-CM | POA: Diagnosis not present

## 2020-05-06 DIAGNOSIS — J45909 Unspecified asthma, uncomplicated: Secondary | ICD-10-CM | POA: Insufficient documentation

## 2020-05-06 DIAGNOSIS — Z96652 Presence of left artificial knee joint: Secondary | ICD-10-CM | POA: Diagnosis not present

## 2020-05-06 DIAGNOSIS — E785 Hyperlipidemia, unspecified: Secondary | ICD-10-CM | POA: Diagnosis not present

## 2020-05-06 DIAGNOSIS — Z96651 Presence of right artificial knee joint: Secondary | ICD-10-CM | POA: Insufficient documentation

## 2020-05-06 DIAGNOSIS — M109 Gout, unspecified: Secondary | ICD-10-CM | POA: Insufficient documentation

## 2020-05-06 DIAGNOSIS — I493 Ventricular premature depolarization: Secondary | ICD-10-CM | POA: Insufficient documentation

## 2020-05-06 DIAGNOSIS — M1712 Unilateral primary osteoarthritis, left knee: Secondary | ICD-10-CM | POA: Diagnosis present

## 2020-05-06 DIAGNOSIS — M21162 Varus deformity, not elsewhere classified, left knee: Secondary | ICD-10-CM | POA: Insufficient documentation

## 2020-05-06 HISTORY — PX: TOTAL KNEE ARTHROPLASTY: SHX125

## 2020-05-06 SURGERY — ARTHROPLASTY, KNEE, TOTAL
Anesthesia: Spinal | Site: Knee | Laterality: Left

## 2020-05-06 MED ORDER — POVIDONE-IODINE 10 % EX SWAB
2.0000 "application " | Freq: Once | CUTANEOUS | Status: AC
  Administered 2020-05-06: 2 via TOPICAL

## 2020-05-06 MED ORDER — AMLODIPINE BESYLATE 5 MG PO TABS
5.0000 mg | ORAL_TABLET | Freq: Every day | ORAL | Status: DC
  Administered 2020-05-07: 08:00:00 5 mg via ORAL
  Filled 2020-05-06: qty 1

## 2020-05-06 MED ORDER — BISACODYL 10 MG RE SUPP: 10.0000 mg | Freq: Every day | RECTAL | Status: DC | PRN

## 2020-05-06 MED ORDER — ACETAMINOPHEN 325 MG PO TABS
650.0000 mg | ORAL_TABLET | ORAL | 2 refills | Status: DC | PRN
Start: 2020-05-06 — End: 2020-09-15

## 2020-05-06 MED ORDER — PHENYLEPHRINE HCL-NACL 10-0.9 MG/250ML-% IV SOLN
INTRAVENOUS | Status: DC | PRN
  Administered 2020-05-06: 50 ug/min via INTRAVENOUS

## 2020-05-06 MED ORDER — MIDAZOLAM HCL 2 MG/2ML IJ SOLN
INTRAMUSCULAR | Status: AC
  Filled 2020-05-06: qty 2

## 2020-05-06 MED ORDER — MIDAZOLAM HCL 5 MG/5ML IJ SOLN
INTRAMUSCULAR | Status: DC | PRN
  Administered 2020-05-06: 2 mg via INTRAVENOUS

## 2020-05-06 MED ORDER — PROPOFOL 500 MG/50ML IV EMUL
INTRAVENOUS | Status: DC | PRN
  Administered 2020-05-06 (×2): 20 mg via INTRAVENOUS

## 2020-05-06 MED ORDER — ACETAMINOPHEN 500 MG PO TABS
1000.0000 mg | ORAL_TABLET | Freq: Four times a day (QID) | ORAL | Status: AC
  Administered 2020-05-06 – 2020-05-07 (×4): 1000 mg via ORAL
  Filled 2020-05-06 (×4): qty 2

## 2020-05-06 MED ORDER — TRANEXAMIC ACID-NACL 1000-0.7 MG/100ML-% IV SOLN
1000.0000 mg | INTRAVENOUS | Status: AC
  Administered 2020-05-06: 1000 mg via INTRAVENOUS
  Filled 2020-05-06: qty 100

## 2020-05-06 MED ORDER — MEPERIDINE HCL 50 MG/ML IJ SOLN: 6.2500 mg | INTRAMUSCULAR | Status: DC | PRN

## 2020-05-06 MED ORDER — LOSARTAN POTASSIUM 50 MG PO TABS
100.0000 mg | ORAL_TABLET | Freq: Every day | ORAL | Status: DC
  Administered 2020-05-07: 100 mg via ORAL
  Filled 2020-05-06: qty 2

## 2020-05-06 MED ORDER — DEXAMETHASONE SODIUM PHOSPHATE 10 MG/ML IJ SOLN
INTRAMUSCULAR | Status: AC
  Filled 2020-05-06: qty 1

## 2020-05-06 MED ORDER — PROPOFOL 500 MG/50ML IV EMUL
INTRAVENOUS | Status: DC | PRN
  Administered 2020-05-06: 60 ug/kg/min via INTRAVENOUS

## 2020-05-06 MED ORDER — ONDANSETRON HCL 4 MG PO TABS: 4.0000 mg | ORAL_TABLET | Freq: Four times a day (QID) | ORAL | Status: DC | PRN

## 2020-05-06 MED ORDER — FENTANYL CITRATE (PF) 100 MCG/2ML IJ SOLN
INTRAMUSCULAR | Status: DC | PRN
  Administered 2020-05-06: 50 ug via INTRAVENOUS

## 2020-05-06 MED ORDER — GUAIFENESIN ER 600 MG PO TB12
600.0000 mg | ORAL_TABLET | Freq: Every evening | ORAL | Status: DC | PRN
  Administered 2020-05-06: 22:00:00 1200 mg via ORAL
  Filled 2020-05-06 (×2): qty 1

## 2020-05-06 MED ORDER — PHENYLEPHRINE HCL (PRESSORS) 10 MG/ML IV SOLN
INTRAVENOUS | Status: AC
  Filled 2020-05-06: qty 1

## 2020-05-06 MED ORDER — TRANEXAMIC ACID-NACL 1000-0.7 MG/100ML-% IV SOLN
INTRAVENOUS | Status: AC
  Filled 2020-05-06: qty 100

## 2020-05-06 MED ORDER — METOCLOPRAMIDE HCL 5 MG/ML IJ SOLN: 5.0000 mg | Freq: Three times a day (TID) | INTRAMUSCULAR | Status: DC | PRN

## 2020-05-06 MED ORDER — DOCUSATE SODIUM 100 MG PO CAPS
100.0000 mg | ORAL_CAPSULE | Freq: Every day | ORAL | 2 refills | Status: DC | PRN
Start: 2020-05-06 — End: 2020-09-15

## 2020-05-06 MED ORDER — BUPIVACAINE IN DEXTROSE 0.75-8.25 % IT SOLN
INTRATHECAL | Status: DC | PRN
  Administered 2020-05-06: 2 mL via INTRATHECAL

## 2020-05-06 MED ORDER — PROPOFOL 1000 MG/100ML IV EMUL
INTRAVENOUS | Status: AC
  Filled 2020-05-06: qty 100

## 2020-05-06 MED ORDER — CEFAZOLIN SODIUM-DEXTROSE 1-4 GM/50ML-% IV SOLN
INTRAVENOUS | Status: AC
  Filled 2020-05-06: qty 50

## 2020-05-06 MED ORDER — HYDROMORPHONE HCL 1 MG/ML IJ SOLN
0.5000 mg | INTRAMUSCULAR | Status: DC | PRN
  Administered 2020-05-06 (×2): 1 mg via INTRAVENOUS
  Filled 2020-05-06 (×2): qty 1

## 2020-05-06 MED ORDER — FEBUXOSTAT 40 MG PO TABS
40.0000 mg | ORAL_TABLET | Freq: Every day | ORAL | Status: DC
  Administered 2020-05-06 – 2020-05-07 (×2): 40 mg via ORAL
  Filled 2020-05-06 (×2): qty 1

## 2020-05-06 MED ORDER — FLEET ENEMA 7-19 GM/118ML RE ENEM: 1.0000 | ENEMA | Freq: Once | RECTAL | Status: DC | PRN

## 2020-05-06 MED ORDER — POLYETHYLENE GLYCOL 3350 17 G PO PACK: 17.0000 g | PACK | Freq: Every day | ORAL | Status: DC | PRN

## 2020-05-06 MED ORDER — TRANEXAMIC ACID 1000 MG/10ML IV SOLN
INTRAVENOUS | Status: DC | PRN
  Administered 2020-05-06: 2000 mg via TOPICAL

## 2020-05-06 MED ORDER — ACETAMINOPHEN 325 MG PO TABS: 325.0000 mg | ORAL_TABLET | Freq: Four times a day (QID) | ORAL | Status: DC | PRN

## 2020-05-06 MED ORDER — DOCUSATE SODIUM 100 MG PO CAPS
100.0000 mg | ORAL_CAPSULE | Freq: Two times a day (BID) | ORAL | Status: DC
  Administered 2020-05-06 – 2020-05-07 (×2): 100 mg via ORAL
  Filled 2020-05-06 (×2): qty 1

## 2020-05-06 MED ORDER — ZOLPIDEM TARTRATE 5 MG PO TABS
5.0000 mg | ORAL_TABLET | Freq: Every evening | ORAL | Status: DC | PRN
  Administered 2020-05-06: 22:00:00 5 mg via ORAL
  Filled 2020-05-06: qty 1

## 2020-05-06 MED ORDER — ONDANSETRON HCL 4 MG/2ML IJ SOLN
INTRAMUSCULAR | Status: AC
  Filled 2020-05-06: qty 2

## 2020-05-06 MED ORDER — PANTOPRAZOLE SODIUM 40 MG PO TBEC
40.0000 mg | DELAYED_RELEASE_TABLET | Freq: Every day | ORAL | Status: DC
  Administered 2020-05-07: 40 mg via ORAL
  Filled 2020-05-06: qty 1

## 2020-05-06 MED ORDER — ONDANSETRON HCL 4 MG/2ML IJ SOLN
INTRAMUSCULAR | Status: DC | PRN
  Administered 2020-05-06: 4 mg via INTRAVENOUS

## 2020-05-06 MED ORDER — SODIUM CHLORIDE (PF) 0.9 % IJ SOLN
INTRAMUSCULAR | Status: DC | PRN
  Administered 2020-05-06: 50 mL

## 2020-05-06 MED ORDER — TRANEXAMIC ACID-NACL 1000-0.7 MG/100ML-% IV SOLN
1000.0000 mg | Freq: Once | INTRAVENOUS | Status: AC
  Administered 2020-05-06: 1000 mg via INTRAVENOUS
  Filled 2020-05-06: qty 100

## 2020-05-06 MED ORDER — GABAPENTIN 300 MG PO CAPS: ORAL_CAPSULE | ORAL | 0 refills | Status: DC

## 2020-05-06 MED ORDER — CEFAZOLIN SODIUM-DEXTROSE 2-4 GM/100ML-% IV SOLN
2.0000 g | INTRAVENOUS | Status: AC
  Administered 2020-05-06: 2 g via INTRAVENOUS
  Filled 2020-05-06: qty 100

## 2020-05-06 MED ORDER — ONDANSETRON HCL 4 MG/2ML IJ SOLN: 4.0000 mg | Freq: Four times a day (QID) | INTRAMUSCULAR | Status: DC | PRN

## 2020-05-06 MED ORDER — SODIUM CHLORIDE (PF) 0.9 % IJ SOLN
INTRAMUSCULAR | Status: AC
  Filled 2020-05-06: qty 50

## 2020-05-06 MED ORDER — ACYCLOVIR 400 MG PO TABS
400.0000 mg | ORAL_TABLET | Freq: Every day | ORAL | Status: DC
  Administered 2020-05-06 – 2020-05-07 (×2): 400 mg via ORAL
  Filled 2020-05-06 (×2): qty 1

## 2020-05-06 MED ORDER — OXYCODONE HCL 5 MG PO TABS
5.0000 mg | ORAL_TABLET | ORAL | Status: DC | PRN
  Administered 2020-05-06 – 2020-05-07 (×5): 10 mg via ORAL
  Filled 2020-05-06 (×5): qty 2

## 2020-05-06 MED ORDER — FENTANYL CITRATE (PF) 100 MCG/2ML IJ SOLN
INTRAMUSCULAR | Status: AC
  Filled 2020-05-06: qty 2

## 2020-05-06 MED ORDER — GABAPENTIN 300 MG PO CAPS
300.0000 mg | ORAL_CAPSULE | Freq: Three times a day (TID) | ORAL | Status: DC
  Administered 2020-05-06 – 2020-05-07 (×3): 300 mg via ORAL
  Filled 2020-05-06 (×3): qty 1

## 2020-05-06 MED ORDER — HYDROMORPHONE HCL 1 MG/ML IJ SOLN
0.2500 mg | INTRAMUSCULAR | Status: DC | PRN
  Administered 2020-05-06: 0.5 mg via INTRAVENOUS

## 2020-05-06 MED ORDER — HYDROMORPHONE HCL 1 MG/ML IJ SOLN
INTRAMUSCULAR | Status: AC
  Filled 2020-05-06: qty 1

## 2020-05-06 MED ORDER — OXYCODONE HCL 5 MG PO TABS: ORAL_TABLET | ORAL | 0 refills | Status: DC

## 2020-05-06 MED ORDER — ATORVASTATIN CALCIUM 20 MG PO TABS
20.0000 mg | ORAL_TABLET | Freq: Every day | ORAL | Status: DC
  Administered 2020-05-06: 22:00:00 20 mg via ORAL
  Filled 2020-05-06: qty 1

## 2020-05-06 MED ORDER — SODIUM CHLORIDE 0.9 % IR SOLN
Status: DC | PRN
  Administered 2020-05-06: 1000 mL

## 2020-05-06 MED ORDER — ASPIRIN 81 MG PO CHEW
81.0000 mg | CHEWABLE_TABLET | Freq: Two times a day (BID) | ORAL | Status: DC
  Administered 2020-05-06 – 2020-05-07 (×2): 81 mg via ORAL
  Filled 2020-05-06 (×2): qty 1

## 2020-05-06 MED ORDER — LACTATED RINGERS IV SOLN: INTRAVENOUS | Status: DC

## 2020-05-06 MED ORDER — SODIUM CHLORIDE 0.9 % IV SOLN: INTRAVENOUS | Status: DC

## 2020-05-06 MED ORDER — WATER FOR IRRIGATION, STERILE IR SOLN
Status: DC | PRN
  Administered 2020-05-06: 2000 mL

## 2020-05-06 MED ORDER — MENTHOL 3 MG MT LOZG: 1.0000 | LOZENGE | OROMUCOSAL | Status: DC | PRN

## 2020-05-06 MED ORDER — PHENOL 1.4 % MT LIQD: 1.0000 | OROMUCOSAL | Status: DC | PRN

## 2020-05-06 MED ORDER — ASPIRIN EC 81 MG PO TBEC
81.0000 mg | DELAYED_RELEASE_TABLET | Freq: Two times a day (BID) | ORAL | 0 refills | Status: AC
Start: 2020-05-06 — End: 2020-06-05

## 2020-05-06 MED ORDER — CEFAZOLIN SODIUM-DEXTROSE 1-4 GM/50ML-% IV SOLN
1.0000 g | Freq: Four times a day (QID) | INTRAVENOUS | Status: AC
  Administered 2020-05-06 (×2): 1 g via INTRAVENOUS
  Filled 2020-05-06: qty 50

## 2020-05-06 MED ORDER — BUPIVACAINE HCL 0.25 % IJ SOLN
INTRAMUSCULAR | Status: AC
  Filled 2020-05-06: qty 1

## 2020-05-06 MED ORDER — METOCLOPRAMIDE HCL 5 MG PO TABS: 5.0000 mg | ORAL_TABLET | Freq: Three times a day (TID) | ORAL | Status: DC | PRN

## 2020-05-06 MED ORDER — BUPIVACAINE LIPOSOME 1.3 % IJ SUSP
INTRAMUSCULAR | Status: DC | PRN
  Administered 2020-05-06: 20 mL

## 2020-05-06 MED ORDER — BUPIVACAINE HCL (PF) 0.25 % IJ SOLN
INTRAMUSCULAR | Status: DC | PRN
  Administered 2020-05-06: 30 mL

## 2020-05-06 MED ORDER — ONDANSETRON HCL 4 MG/2ML IJ SOLN: 4.0000 mg | Freq: Once | INTRAMUSCULAR | Status: DC | PRN

## 2020-05-06 SURGICAL SUPPLY — 70 items
APL SKNCLS STERI-STRIP NONHPOA (GAUZE/BANDAGES/DRESSINGS) ×1
ATTUNE MED DOME PAT 41 KNEE (Knees) ×1 IMPLANT
ATTUNE MED DOME PAT 41MM KNEE (Knees) ×1 IMPLANT
ATTUNE PS FEM LT SZ 7 CEM KNEE (Femur) ×2 IMPLANT
ATTUNE PSRP INSR SZ7 6 KNEE (Insert) ×1 IMPLANT
ATTUNE PSRP INSR SZ7 6MM KNEE (Insert) ×1 IMPLANT
BAG DECANTER FOR FLEXI CONT (MISCELLANEOUS) ×3 IMPLANT
BAG SPEC THK2 15X12 ZIP CLS (MISCELLANEOUS) ×1
BAG ZIPLOCK 12X15 (MISCELLANEOUS) ×3 IMPLANT
BASE TIBIAL ROT PLAT SZ 7 KNEE (Knees) IMPLANT
BENZOIN TINCTURE PRP APPL 2/3 (GAUZE/BANDAGES/DRESSINGS) ×3 IMPLANT
BLADE SAGITTAL 25.0X1.19X90 (BLADE) ×2 IMPLANT
BLADE SAGITTAL 25.0X1.19X90MM (BLADE) ×1
BLADE SAW SGTL 13X75X1.27 (BLADE) ×3 IMPLANT
BLADE SURG 15 STRL LF DISP TIS (BLADE) ×1 IMPLANT
BLADE SURG 15 STRL SS (BLADE) ×3
BLADE SURG SZ10 CARB STEEL (BLADE) ×3 IMPLANT
BNDG CMPR MED 15X6 ELC VLCR LF (GAUZE/BANDAGES/DRESSINGS) ×1
BNDG ELASTIC 6X15 VLCR STRL LF (GAUZE/BANDAGES/DRESSINGS) ×3 IMPLANT
BOWL SMART MIX CTS (DISPOSABLE) ×3 IMPLANT
BSPLAT TIB 7 CMNT ROT PLAT STR (Knees) ×1 IMPLANT
CEMENT HV SMART SET (Cement) ×2 IMPLANT
CLOSURE STERI-STRIP 1/2X4 (GAUZE/BANDAGES/DRESSINGS) ×2
CLSR STERI-STRIP ANTIMIC 1/2X4 (GAUZE/BANDAGES/DRESSINGS) ×4 IMPLANT
COVER SURGICAL LIGHT HANDLE (MISCELLANEOUS) ×3 IMPLANT
COVER WAND RF STERILE (DRAPES) ×3 IMPLANT
CUFF TOURN SGL QUICK 34 (TOURNIQUET CUFF) ×3
CUFF TRNQT CYL 34X4.125X (TOURNIQUET CUFF) ×1 IMPLANT
DECANTER SPIKE VIAL GLASS SM (MISCELLANEOUS) ×6 IMPLANT
DRAPE INCISE IOBAN 66X45 STRL (DRAPES) IMPLANT
DRAPE U-SHAPE 47X51 STRL (DRAPES) ×3 IMPLANT
DRESSING AQUACEL AG SP 3.5X10 (GAUZE/BANDAGES/DRESSINGS) ×1 IMPLANT
DRSG AQUACEL AG ADV 3.5X10 (GAUZE/BANDAGES/DRESSINGS) ×2 IMPLANT
DRSG AQUACEL AG SP 3.5X10 (GAUZE/BANDAGES/DRESSINGS) ×3
DURAPREP 26ML APPLICATOR (WOUND CARE) ×6 IMPLANT
ELECT REM PT RETURN 15FT ADLT (MISCELLANEOUS) ×3 IMPLANT
GLOVE BIOGEL PI IND STRL 8 (GLOVE) ×2 IMPLANT
GLOVE BIOGEL PI INDICATOR 8 (GLOVE) ×4
GLOVE SURG ORTHO 8.0 STRL STRW (GLOVE) ×3 IMPLANT
GLOVE SURG SS PI 7.5 STRL IVOR (GLOVE) ×3 IMPLANT
GOWN STRL REUS W/TWL XL LVL3 (GOWN DISPOSABLE) ×6 IMPLANT
HANDPIECE INTERPULSE COAX TIP (DISPOSABLE) ×3
HOLDER FOLEY CATH W/STRAP (MISCELLANEOUS) IMPLANT
HOOD PEEL AWAY FLYTE STAYCOOL (MISCELLANEOUS) ×3 IMPLANT
IMMOBILIZER KNEE 20 (SOFTGOODS) ×3
IMMOBILIZER KNEE 20 THIGH 36 (SOFTGOODS) ×1 IMPLANT
IMMOBILIZER KNEE 22 UNIV (SOFTGOODS) ×2 IMPLANT
KIT TURNOVER KIT A (KITS) IMPLANT
MANIFOLD NEPTUNE II (INSTRUMENTS) ×3 IMPLANT
NEEDLE HYPO 22GX1.5 SAFETY (NEEDLE) ×6 IMPLANT
NS IRRIG 1000ML POUR BTL (IV SOLUTION) ×3 IMPLANT
PACK ICE MAXI GEL EZY WRAP (MISCELLANEOUS) ×3 IMPLANT
PACK TOTAL KNEE CUSTOM (KITS) ×3 IMPLANT
PENCIL SMOKE EVACUATOR (MISCELLANEOUS) IMPLANT
PIN DRILL FIX HALF THREAD (BIT) ×2 IMPLANT
PIN STEINMAN FIXATION KNEE (PIN) ×2 IMPLANT
PROTECTOR NERVE ULNAR (MISCELLANEOUS) ×3 IMPLANT
SET HNDPC FAN SPRY TIP SCT (DISPOSABLE) ×1 IMPLANT
SUT ETHIBOND NAB CT1 #1 30IN (SUTURE) ×6 IMPLANT
SUT MNCRL AB 3-0 PS2 18 (SUTURE) ×3 IMPLANT
SUT VIC AB 0 CT1 36 (SUTURE) ×3 IMPLANT
SUT VIC AB 2-0 CT1 27 (SUTURE) ×6
SUT VIC AB 2-0 CT1 TAPERPNT 27 (SUTURE) ×2 IMPLANT
SYR CONTROL 10ML LL (SYRINGE) ×9 IMPLANT
TAPE STRIPS DRAPE STRL (GAUZE/BANDAGES/DRESSINGS) ×2 IMPLANT
TIBIAL BASE ROT PLAT SZ 7 KNEE (Knees) ×3 IMPLANT
TRAY FOLEY MTR SLVR 16FR STAT (SET/KITS/TRAYS/PACK) ×3 IMPLANT
WATER STERILE IRR 1000ML POUR (IV SOLUTION) ×6 IMPLANT
WRAP KNEE MAXI GEL POST OP (GAUZE/BANDAGES/DRESSINGS) ×2 IMPLANT
YANKAUER SUCT BULB TIP 10FT TU (MISCELLANEOUS) ×3 IMPLANT

## 2020-05-06 NOTE — Evaluation (Signed)
Physical Therapy Evaluation Patient Details Name: Rodney Foster MRN: [id number] DOB: 1950/01/23 Today's Date: 05/06/2020   History of Present Illness  Pt s/p L TKR and with hx of R TKR in 2020.  Clinical Impression  Pt s/p L TKR and presents with decreased L LE strength/ROM and post op pain limiting functional mobility.  Pt should progress to dc home with family assist.  Pt reports first OP PT scheduled for Monday 05/11/20    Follow Up Recommendations Follow surgeon's recommendation for DC plan and follow-up therapies    Equipment Recommendations  None recommended by PT    Recommendations for Other Services       Precautions / Restrictions Precautions Precautions: Knee;Fall Required Braces or Orthoses: Knee Immobilizer - Left Knee Immobilizer - Left: Discontinue once straight leg raise with < 10 degree lag(Pt performed IND SLR this date) Restrictions Weight Bearing Restrictions: No Other Position/Activity Restrictions: WBAT      Mobility  Bed Mobility Overal bed mobility: Needs Assistance Bed Mobility: Supine to Sit     Supine to sit: Min guard     General bed mobility comments: cues for sequence and min guard for safety  Transfers Overall transfer level: Needs assistance Equipment used: Rolling walker (2 wheeled) Transfers: Sit to/from Stand Sit to Stand: Min assist         General transfer comment: cues for LE management and use of UEs to self assist  Ambulation/Gait Ambulation/Gait assistance: Min assist Gait Distance (Feet): 38 Feet Assistive device: Rolling walker (2 wheeled) Gait Pattern/deviations: Step-to pattern;Decreased step length - right;Decreased step length - left;Shuffle;Trunk flexed Gait velocity: decr   General Gait Details: cues for sequence, posture and position from RW; distance ltd by pain  Stairs            Wheelchair Mobility    Modified Rankin (Stroke Patients Only)       Balance Overall balance assessment: Needs  assistance Sitting-balance support: No upper extremity supported;Feet supported Sitting balance-Leahy Scale: Good     Standing balance support: Bilateral upper extremity supported Standing balance-Leahy Scale: Poor                               Pertinent Vitals/Pain Pain Assessment: 0-10 Pain Score: 7  Pain Location: L knee Pain Descriptors / Indicators: Aching;Sore Pain Intervention(s): Limited activity within patient's tolerance;Monitored during session;Premedicated before session;Ice applied    Home Living Family/patient expects to be discharged to:: Private residence Living Arrangements: Spouse/significant other Available Help at Discharge: Friend(s) Type of Home: House Home Access: Stairs to enter Entrance Stairs-Rails: None Entrance Stairs-Number of Steps: 3 Home Layout: Two level;Able to live on main level with bedroom/bathroom;Full bath on main level Home Equipment: Walker - 2 wheels;Cane - single point;Bedside commode Additional Comments: pt lives with girlfriend, he works part time from home and has an office upstairs but does lives on main level    Prior Function Level of Independence: Independent         Comments: pt was ambulating with no device prior to surgery however wa hobbling due to pain     Hand Dominance   Dominant Hand: Right    Extremity/Trunk Assessment   Upper Extremity Assessment Upper Extremity Assessment: Overall WFL for tasks assessed    Lower Extremity Assessment Lower Extremity Assessment: LLE deficits/detail    Cervical / Trunk Assessment Cervical / Trunk Assessment: Normal  Communication   Communication: No difficulties  Cognition Arousal/Alertness: Awake/alert  Behavior During Therapy: WFL for tasks assessed/performed Overall Cognitive Status: Within Functional Limits for tasks assessed                                        General Comments      Exercises Total Joint Exercises Ankle  Circles/Pumps: AROM;Both;15 reps;Supine   Assessment/Plan    PT Assessment Patient needs continued PT services  PT Problem List Decreased strength;Decreased range of motion;Decreased activity tolerance;Decreased mobility;Decreased knowledge of use of DME;Pain       PT Treatment Interventions DME instruction;Gait training;Stair training;Functional mobility training;Therapeutic activities;Therapeutic exercise;Patient/family education    PT Goals (Current goals can be found in the Care Plan section)  Acute Rehab PT Goals Patient Stated Goal: Regain IND PT Goal Formulation: With patient Time For Goal Achievement: 05/13/20 Potential to Achieve Goals: Good    Frequency 7X/week   Barriers to discharge        Co-evaluation               AM-PAC PT "6 Clicks" Mobility  Outcome Measure Help needed turning from your back to your side while in a flat bed without using bedrails?: A Little Help needed moving from lying on your back to sitting on the side of a flat bed without using bedrails?: A Little Help needed moving to and from a bed to a chair (including a wheelchair)?: A Little Help needed standing up from a chair using your arms (e.g., wheelchair or bedside chair)?: A Little Help needed to walk in hospital room?: A Little Help needed climbing 3-5 steps with a railing? : A Lot 6 Click Score: 17    End of Session Equipment Utilized During Treatment: Gait belt Activity Tolerance: Patient tolerated treatment well;Patient limited by pain Patient left: in chair;with call bell/phone within reach;with chair alarm set Nurse Communication: Mobility status PT Visit Diagnosis: Difficulty in walking, not elsewhere classified (R26.2)    Time: [id number] PT Time Calculation (min) (ACUTE ONLY): 22 min   Charges:   PT Evaluation $PT Eval Low Complexity: [place] Pager [phone] Office  [phone]   Nhia Heaphy 05/06/2020, 5:53 PM

## 2020-05-06 NOTE — Anesthesia Preprocedure Evaluation (Addendum)
Anesthesia Evaluation  Patient identified by MRN, date of birth, ID band Patient awake    Reviewed: Allergy & Precautions, NPO status , Patient's Chart, lab work & pertinent test results  Airway Mallampati: II  TM Distance: >3 FB Neck ROM: Full    Dental   Pulmonary former smoker,    Pulmonary exam normal        Cardiovascular hypertension, Pt. on medications Normal cardiovascular exam     Neuro/Psych    GI/Hepatic   Endo/Other    Renal/GU      Musculoskeletal   Abdominal   Peds  Hematology   Anesthesia Other Findings   Reproductive/Obstetrics                             Anesthesia Physical Anesthesia Plan  ASA: II  Anesthesia Plan: Spinal   Post-op Pain Management:  Regional for Post-op pain   Induction: Intravenous  PONV Risk Score and Plan: 1 and Ondansetron  Airway Management Planned: Nasal Cannula  Additional Equipment:   Intra-op Plan:   Post-operative Plan:   Informed Consent: I have reviewed the patients History and Physical, chart, labs and discussed the procedure including the risks, benefits and alternatives for the proposed anesthesia with the patient or authorized representative who has indicated his/her understanding and acceptance.       Plan Discussed with: CRNA and Surgeon  Anesthesia Plan Comments:         Anesthesia Quick Evaluation

## 2020-05-06 NOTE — Progress Notes (Signed)
Orthopedic Tech Progress Note Patient Details:  Rodney Foster 26-Mar-1950 [id number]  CPM Left Knee CPM Left Knee: On Left Knee Flexion (Degrees): 90 Left Knee Extension (Degrees): 0 Additional Comments: Trapeze bar and foot roll  Post Interventions Patient Tolerated: Well Instructions Provided: Care of device  [place] Pink 05/06/2020, 12:57 PM

## 2020-05-06 NOTE — Brief Op Note (Signed)
05/06/2020  10:02 AM  PATIENT:  Garret Reddish  70 y.o. male  PRE-OPERATIVE DIAGNOSIS:  OSTEOARTHRITIS  LEFT KNEE  POST-OPERATIVE DIAGNOSIS:  OSTEOARTHRITIS  LEFT KNEE  PROCEDURE:  Procedure(s): TOTAL KNEE ARTHROPLASTY (Left)  SURGEON:  Surgeon(s) and Role:    Earlie Server, MD - Primary  PHYSICIAN ASSISTANT: Chriss Czar, PA-C  ASSISTANTS: OR staff x1   ANESTHESIA:   local, regional, spinal and IV sedation  EBL:  25 mL   BLOOD ADMINISTERED:none  DRAINS: none   LOCAL MEDICATIONS USED:  MARCAINE     SPECIMEN:  No Specimen  DISPOSITION OF SPECIMEN:  N/A  COUNTS:  YES  TOURNIQUET:   Total Tourniquet Time Documented: Thigh (Left) - 59 minutes Total: Thigh (Left) - 59 minutes   DICTATION: .Other Dictation: Dictation Number unknown  PLAN OF CARE: Admit for overnight observation  PATIENT DISPOSITION:  PACU - hemodynamically stable.   Delay start of Pharmacological VTE agent (>24hrs) due to surgical blood loss or risk of bleeding: yes

## 2020-05-06 NOTE — Discharge Instructions (Signed)

## 2020-05-06 NOTE — Anesthesia Postprocedure Evaluation (Signed)
Anesthesia Post Note  Patient: TEGHAN SCHNEEKLOTH  Procedure(s) Performed: TOTAL KNEE ARTHROPLASTY (Left Knee)     Patient location during evaluation: PACU Anesthesia Type: Spinal Level of consciousness: oriented and awake and alert Pain management: pain level controlled Vital Signs Assessment: post-procedure vital signs reviewed and stable Respiratory status: spontaneous breathing, respiratory function stable and patient connected to nasal cannula oxygen Cardiovascular status: blood pressure returned to baseline and stable Postop Assessment: no headache, no backache and no apparent nausea or vomiting Anesthetic complications: no    Last Vitals:  Vitals:   05/06/20 1115 05/06/20 1215  BP: 128/78 129/82  Pulse: 60 62  Resp: 16 16  Temp: 36.6 C 36.5 C  SpO2: 99% 99%    Last Pain:  Vitals:   05/06/20 1328  TempSrc:   PainSc: 10-Worst pain ever                 Devansh Riese DAVID

## 2020-05-06 NOTE — Progress Notes (Signed)
Orthopedic Tech Progress Note Patient Details:  Rodney Foster [birth year] [id number]  CPM Left Knee CPM Left Knee: Off Left Knee Flexion (Degrees): 90 Left Knee Extension (Degrees): 0 Additional Comments: Trapeze bar and foot roll  Post Interventions Patient Tolerated: Well Instructions Provided: Care of device  [place] Pink 05/06/2020, 3:59 PM

## 2020-05-06 NOTE — Interval H&P Note (Signed)
History and Physical Interval Note:  05/06/2020 7:35 AM  Rodney Foster  has presented today for surgery, with the diagnosis of OSTEOARTHRITIS  LEFT KNEE.  The various methods of treatment have been discussed with the patient and family. After consideration of risks, benefits and other options for treatment, the patient has consented to  Procedure(s): TOTAL KNEE ARTHROPLASTY (Left) as a surgical intervention.  The patient's history has been reviewed, patient examined, no change in status, stable for surgery.  I have reviewed the patient's chart and labs.  Questions were answered to the patient's satisfaction.     [name]

## 2020-05-06 NOTE — Anesthesia Procedure Notes (Signed)
Spinal  Patient location during procedure: OR Start time: 05/06/2020 7:42 AM End time: 05/06/2020 7:46 AM Staffing Performed: resident/CRNA  Anesthesiologist: Lillia Abed, MD Resident/CRNA: Glory Buff, CRNA Preanesthetic Checklist Completed: patient identified, IV checked, site marked, risks and benefits discussed, surgical consent, monitors and equipment checked, pre-op evaluation and timeout performed Spinal Block Patient position: sitting Prep: DuraPrep Patient monitoring: heart rate, cardiac monitor, continuous pulse ox and blood pressure Approach: midline Location: L3-4 Injection technique: single-shot Needle Needle type: Pencan  Needle gauge: 24 G Needle length: 9 cm Assessment Sensory level: T6 Additional Notes Kit expiration date checked and verified.  Sterile prep and drape, skin local with 1% lidocaine, stick x 1, - heme, - paraesthesia, + CSF pre and post injection, patient tolerated procedure well.

## 2020-05-06 NOTE — Anesthesia Procedure Notes (Signed)
Date/Time: 05/06/2020 7:38 AM Performed by: Glory Buff, CRNA Oxygen Delivery Method: Simple face mask

## 2020-05-06 NOTE — Transfer of Care (Signed)
Immediate Anesthesia Transfer of Care Note  Patient: Rodney Foster  Procedure(s) Performed: TOTAL KNEE ARTHROPLASTY (Left Knee)  Patient Location: PACU  Anesthesia Type:MAC and Spinal  Level of Consciousness: drowsy, patient cooperative and responds to stimulation  Airway & Oxygen Therapy: Patient Spontanous Breathing and Patient connected to face mask oxygen  Post-op Assessment: Report given to RN and Post -op Vital signs reviewed and stable  Post vital signs: Reviewed and stable  Last Vitals:  Vitals Value Taken Time  BP 125/77 05/06/20 0950  Temp    Pulse 75 05/06/20 0951  Resp 15 05/06/20 0951  SpO2 100 % 05/06/20 0951  Vitals shown include unvalidated device data.  Last Pain:  Vitals:   05/06/20 0627  TempSrc: Oral  PainSc: 5       Patients Stated Pain Goal: 5 (47/84/12 8208)  Complications: No apparent anesthesia complications

## 2020-05-07 DIAGNOSIS — M1712 Unilateral primary osteoarthritis, left knee: Secondary | ICD-10-CM | POA: Diagnosis not present

## 2020-05-07 DIAGNOSIS — G4700 Insomnia, unspecified: Secondary | ICD-10-CM | POA: Diagnosis not present

## 2020-05-07 DIAGNOSIS — M24562 Contracture, left knee: Secondary | ICD-10-CM | POA: Diagnosis not present

## 2020-05-07 DIAGNOSIS — I493 Ventricular premature depolarization: Secondary | ICD-10-CM | POA: Diagnosis not present

## 2020-05-07 DIAGNOSIS — Z96651 Presence of right artificial knee joint: Secondary | ICD-10-CM | POA: Diagnosis not present

## 2020-05-07 DIAGNOSIS — J45909 Unspecified asthma, uncomplicated: Secondary | ICD-10-CM | POA: Diagnosis not present

## 2020-05-07 DIAGNOSIS — E785 Hyperlipidemia, unspecified: Secondary | ICD-10-CM | POA: Diagnosis not present

## 2020-05-07 DIAGNOSIS — M21162 Varus deformity, not elsewhere classified, left knee: Secondary | ICD-10-CM | POA: Diagnosis not present

## 2020-05-07 DIAGNOSIS — R739 Hyperglycemia, unspecified: Secondary | ICD-10-CM | POA: Diagnosis not present

## 2020-05-07 LAB — BASIC METABOLIC PANEL
Anion gap: 7 (ref 5–15)
BUN: 11 mg/dL (ref 8–23)
CO2: 26 mmol/L (ref 22–32)
Calcium: 8.6 mg/dL — ABNORMAL LOW (ref 8.9–10.3)
Chloride: 103 mmol/L (ref 98–111)
Creatinine, Ser: 0.82 mg/dL (ref 0.61–1.24)
GFR calc Af Amer: >60 mL/min (ref >60)
GFR calc non Af Amer: >60 mL/min (ref >60)
Glucose, Bld: 113 mg/dL — ABNORMAL HIGH (ref 70–99)
Potassium: 3.8 mmol/L (ref 3.5–5.1)
Sodium: 136 mmol/L (ref 135–145)

## 2020-05-07 LAB — CBC
HCT: 38.7 % — ABNORMAL LOW (ref 39.0–52.0)
Hemoglobin: 12.9 g/dL — ABNORMAL LOW (ref 13.0–17.0)
MCH: 32.9 pg (ref 26.0–34.0)
MCHC: 33.3 g/dL (ref 30.0–36.0)
MCV: 98.7 fL (ref 80.0–100.0)
Platelets: 199 10*3/uL (ref 150–400)
RBC: 3.92 MIL/uL — ABNORMAL LOW (ref 4.22–5.81)
RDW: 12.1 % (ref 11.5–15.5)
WBC: 11.9 10*3/uL — ABNORMAL HIGH (ref 4.0–10.5)
nRBC: 0 % (ref 0.0–0.2)

## 2020-05-07 NOTE — Op Note (Signed)
NAME: Rodney Foster, Rodney Foster MEDICAL RECORD [id number] ACCOUNT [id number] DATE OF BIRTH:1950/02/10 FACILITY: [facility] LOCATION: [facility] PHYSICIAN:W. Anthany Thornhill JR., MD  OPERATIVE REPORT  DATE OF PROCEDURE:  05/06/2020  PREOPERATIVE DIAGNOSIS:  Severe osteoarthritis, left knee with varus deformity and flexion contracture.  POSTOPERATIVE DIAGNOSIS:  Severe osteoarthritis, left knee with varus deformity and flexion contracture.  OPERATION:  Left total knee replacement (DePuy Attune knee, size 7 femur, tibia, 41 mm all-poly patella and 6 mm thickness tibial bearing).  SURGEON:  Vangie Bicker, MD  ASSISTANT:  RANDY CHADWELL.  ANESTHESIA:  Spinal.  TOURNIQUET TIME:  56 minutes.  DESCRIPTION OF PROCEDURE:  Straight skin incision was made with medial parapatellar approach to the knee made after exsanguination of leg inflation of thigh tourniquet to 350.  We did a 5-degree 9 mm cut on the distal femur followed by cutting about 9 mm  below most the least diseased lateral compartment with provisional secondary cut to allow full extension with the spacer block.  Femur was sized to be a size 7, as was the opposite knee placed an all-in-1 cutting block in appropriate degree of external  rotation accomplishing the anterior and posterior chamfer cuts without difficulty.  The keel was cut for the tibia as well as the box cut for the femur.  Patella was cut resecting 9.5 mm of patella.  All trials were placed.  The varus was resolved.  Good  stability was noted.  We actually had a little bit better flexion stability with the 6 mm bearing and settled on that as the final bearing thickness.  Cement was prepared on the back table.  We then did pulsatile lavage as well as infiltration of the  soft tissues with Marcaine and Exparel.  The final prosthesis was inserted into the tibia, followed by femur and patella with the trial bearing.  Cement was allowed to harden.  We then removed the bearing and then  tourniquet was released under direct  vision, no excessive bleeding was noted.  Small bleeders were coagulated.  Closure was affected with #1 Ethibond, 2-0 Vicryl and Monocryl in the skin.  A lightly compressive sterile dressing and knee immobilizer applied, taken to recovery room in stable  condition.  PN/NUANCE  D:05/06/2020 T:05/07/2020 JOB:[id number]

## 2020-05-07 NOTE — Progress Notes (Signed)
Physical Therapy Treatment Patient Details Name: Rodney Foster MRN: [id number] DOB: 11-Apr-1950 Today's Date: 05/07/2020    History of Present Illness Pt s/p L TKR and with hx of R TKR in 2020.    PT Comments    Pt with improved tolerance for activity today, ambulating good hallway distance with use of RW. Pt requires some cuing for optimizing gait (improving step length, step over step gait, upright posture). Pt proficiently navigated steps, handout administered and reviewed. Pt also tolerated TKR exercises well, PT administered handout and reviewed frequency/repetitions with pt. Pt with no further questions, all education completed, pt appropriate to d/c from a mobility standpoint with intermittent assist from his significant other.    Follow Up Recommendations  Follow surgeon's recommendation for DC plan and follow-up therapies     Equipment Recommendations  None recommended by PT    Recommendations for Other Services       Precautions / Restrictions Precautions Precautions: Knee;Fall Knee Immobilizer - Left: (Pt performed IND SLR this date) Restrictions Weight Bearing Restrictions: No Other Position/Activity Restrictions: WBAT    Mobility  Bed Mobility Overal bed mobility: Needs Assistance Bed Mobility: Supine to Sit     Supine to sit: Supervision     General bed mobility comments: supervision for safety, with light assist with lowering LLE to floor at EOB.  Transfers Overall transfer level: Needs assistance Equipment used: Rolling walker (2 wheeled) Transfers: Sit to/from Stand Sit to Stand: Min guard;From elevated surface         General transfer comment: for safety, verbal cuing for hand placement when rising. From eelvated bed height because pt's bed is tall at home.  Ambulation/Gait Ambulation/Gait assistance: Supervision;Min guard Gait Distance (Feet): 200 Feet Assistive device: Rolling walker (2 wheeled) Gait Pattern/deviations: Step-to  pattern;Decreased step length - left;Trunk flexed;Decreased stride length;Step-through pattern;Antalgic Gait velocity: decr   General Gait Details: initially min guard, transitioning to min assist for safety. Verbal cuing for upright posture, placement in RW, step-over-step gait as tolerated, and maintaining constant RW speed for more natural gait.   Stairs Stairs: Yes Stairs assistance: Min guard Stair Management: One rail Left;Step to pattern;Forwards;With cane Number of Stairs: 3 General stair comments: for safety, verbal cuing for sequencing of LEs (up with good leg leading, down with bad leg leading), sequencing cane during stepping. Practiced x2, pt with improving form upon second attempt with less cuing required.   Wheelchair Mobility    Modified Rankin (Stroke Patients Only)       Balance Overall balance assessment: Needs assistance Sitting-balance support: No upper extremity supported;Feet supported Sitting balance-Leahy Scale: Good     Standing balance support: Bilateral upper extremity supported Standing balance-Leahy Scale: Poor Standing balance comment: reliant on external support                            Cognition Arousal/Alertness: Awake/alert Behavior During Therapy: WFL for tasks assessed/performed Overall Cognitive Status: Within Functional Limits for tasks assessed                                        Exercises Total Joint Exercises Ankle Circles/Pumps: AROM;Both;10 reps;Seated Quad Sets: AROM;Left;10 reps;Seated Heel Slides: AAROM;Left;10 reps;Seated(with towel under foot and min overpressure at ankle from PT for improved knee flexion ROM) Hip ABduction/ADduction: AAROM;Left;10 reps;Seated Goniometric ROM: knee aarom ~10-80*    General Comments  Pertinent Vitals/Pain Pain Assessment: Faces Pain Score: 7  Pain Location: L knee Pain Descriptors / Indicators: Aching;Sore Pain Intervention(s): Limited  activity within patient's tolerance;Monitored during session;Premedicated before session;Repositioned;Ice applied    Home Living                      Prior Function            PT Goals (current goals can now be found in the care plan section) Acute Rehab PT Goals Patient Stated Goal: Regain IND PT Goal Formulation: With patient Time For Goal Achievement: 05/13/20 Potential to Achieve Goals: Good Progress towards PT goals: Progressing toward goals    Frequency    7X/week      PT Plan Current plan remains appropriate    Co-evaluation              AM-PAC PT "6 Clicks" Mobility   Outcome Measure  Help needed turning from your back to your side while in a flat bed without using bedrails?: None Help needed moving from lying on your back to sitting on the side of a flat bed without using bedrails?: A Little Help needed moving to and from a bed to a chair (including a wheelchair)?: None Help needed standing up from a chair using your arms (e.g., wheelchair or bedside chair)?: None Help needed to walk in hospital room?: A Little Help needed climbing 3-5 steps with a railing? : A Little 6 Click Score: 21    End of Session Equipment Utilized During Treatment: Gait belt Activity Tolerance: Patient tolerated treatment well;Patient limited by pain Patient left: in chair;with call bell/phone within reach;with chair alarm set Nurse Communication: Mobility status PT Visit Diagnosis: Difficulty in walking, not elsewhere classified (R26.2)     Time: 0900-0930 PT Time Calculation (min) (ACUTE ONLY): 30 min  Charges:  $Gait Training: 8-22 mins $Therapeutic Exercise: 8-22 mins                    Erlene Devita E, PT Acute Rehabilitation Services Pager [phone]  Office [phone]   Derrious Bologna D Antonella Upson 05/07/2020, 10:07 AM

## 2020-05-07 NOTE — Progress Notes (Signed)
    Subjective: Patient reports pain as moderate.  Tolerating diet.  Urinating.  Good early mobilization.  Objective:   VITALS:   Vitals:   05/06/20 1846 05/06/20 2042 05/07/20 0136 05/07/20 0549  BP: (!) 160/89 138/82 130/77 (!) 156/88  Pulse: 94 79 68 77  Resp: 17 18 18 18   Temp: 98.7 F (37.1 C) 98.4 F (36.9 C) 98 F (36.7 C) 98 F (36.7 C)  TempSrc:  Oral Oral Oral  SpO2: 95% 99% 99%   Weight:      Height:       CBC Latest Ref Rng & Units 05/07/2020 04/25/2020 04/11/2020  WBC 4.0 - 10.5 K/uL 11.9(H) 7.7 8.3  Hemoglobin 13.0 - 17.0 g/dL 12.9(L) 15.9 15.4  Hematocrit 39.0 - 52.0 % 38.7(L) 46.4 44.2  Platelets 150 - 400 K/uL 199 258 265   BMP Latest Ref Rng & Units 05/07/2020 04/25/2020 04/11/2020  Glucose 70 - 99 mg/dL 113(H) 113(H) 163(H)  BUN 8 - 23 mg/dL 11 11 16   Creatinine 0.61 - 1.24 mg/dL 0.82 0.95 0.89  BUN/Creat Ratio 6 - 22 (calc) - - NOT APPLICABLE  Sodium A999333 - 145 mmol/L 136 139 135  Potassium 3.5 - 5.1 mmol/L 3.8 4.2 4.5  Chloride 98 - 111 mmol/L 103 104 102  CO2 22 - 32 mmol/L 26 26 24   Calcium 8.9 - 10.3 mg/dL 8.6(L) 9.2 9.7   Intake/Output      05/07 0701 - 05/08 0700   P.O. 1420   I.V. (mL/kg) 3280 (32.7)   IV Piggyback 361.9   Total Intake(mL/kg) 5062 (50.5)   Urine (mL/kg/hr) 3050 (1.3)   Blood 25   Total Output 3075   Net +1987          Physical Exam: General: NAD.  Upright in bed.  Calm, conversant. Resp: No increased wob Cardio: regular rate and rhythm ABD soft Neurologically intact MSK LLE: Neurovascularly intact Sensation intact distally Feet warm Dorsiflexion/Plantar flexion intact Incision: dressing C/D/I   Assessment: 1 Day Post-Op  S/P Procedure(s) (LRB): TOTAL KNEE ARTHROPLASTY (Left) by Dr. French Ana on 05/06/2020  Active Problems:   Primary localized osteoarthritis of left knee   Primary osteoarthritis, status post left total knee arthroplasty Doing well postop day 1 Tolerating diet and voiding Pain  controlled Good early mobilization yesterday  Plan: Up with therapy Incentive Spirometry Elevate and Apply ice CPM, bone foam  Weight Bearing: Weight Bearing as Tolerated (WBAT) LLE Dressings: Maintain Mepilex.  Please ensure thigh high TED hose are applied to operative leg prior to discharge. VTE prophylaxis: Aspirin, SCDs, ambulation Dispo: Home today after therapy evaluation    Patient's anticipated LOS is less than 2 midnights, meeting these requirements: - Lives within 1 hour of care - Has a competent adult at home to recover with post-op recover - NO history of  - Chronic pain requiring opiods  - Diabetes  - Coronary Artery Disease  - Heart failure  - Heart attack  - Stroke  - DVT/VTE  - Cardiac arrhythmia  - Respiratory Failure/COPD  - Renal failure  - Anemia  - Advanced Liver disease    Prudencio Burly III, PA-C 05/07/2020, 6:56 AM

## 2020-05-07 NOTE — Plan of Care (Signed)
Patient discharged home in stable condition, waiting on his ride 

## 2020-05-07 NOTE — Evaluation (Signed)
Occupational Therapy Evaluation Patient Details Name: Rodney Foster MRN: [id number] DOB: 1950/02/01 Today's Date: 05/07/2020    History of Present Illness Pt s/p L TKR and with hx of R TKR in 2020.   Clinical Impression   Rodney Foster is a 70 year old man s/p TKR. On evaluation patient presents with functional ROM and strength of upper extremities and ability to perform functional mobility and most aspects of ADLs. Patient provided with education on compensatory techniques for lower body dressing/bathing for home use. Therapist instructed patient on how to perform safe tub transfer with recreation of home environment. Patient performed well. No further OT needs at this time.    Follow Up Recommendations  No OT follow up    Equipment Recommendations    Patient reports having toilet riser and shower chair.   Recommendations for Other Services       Precautions / Restrictions Precautions Precautions: Knee;Fall Knee Immobilizer - Left: (Pt performed IND SLR this date) Restrictions Weight Bearing Restrictions: No Other Position/Activity Restrictions: WBAT      Mobility Bed Mobility Overal bed mobility: Needs Assistance Bed Mobility: Supine to Sit     Supine to sit: Supervision     General bed mobility comments: supervision for safety, with light assist with lowering LLE to floor at EOB.  Transfers Overall transfer level: Needs assistance Equipment used: Rolling walker (2 wheeled) Transfers: Sit to/from Stand Sit to Stand: Min guard;From elevated surface         General transfer comment: supervision with verbal cueing for technique and safety.    Balance Overall balance assessment: No apparent balance deficits (not formally assessed) Sitting-balance support: No upper extremity supported;Feet supported Sitting balance-Leahy Scale: Good     Standing balance support: Bilateral upper extremity supported Standing balance-Leahy Scale: Poor Standing balance comment:  reliant on external support                           ADL either performed or assessed with clinical judgement   ADL Overall ADL's : Needs assistance/impaired Eating/Feeding: Independent   Grooming: Wash/dry hands;Wash/dry face;Independent   Upper Body Bathing: Independent   Lower Body Bathing: Minimal assistance Lower Body Bathing Details (indicate cue type and reason): Patient educated on use of long handled sponge to reach feet/lower legs. Upper Body Dressing : Independent   Lower Body Dressing: Minimal assistance Lower Body Dressing Details (indicate cue type and reason): Patient able to donn boxers and pants. Difficulty gettng clothing over left foot but able to perform. Min assist with donning left shoe. Therapist instructed patient to stand to slide shoe on and provided shoe horn. Disccussed need for shoe horn for home use and where to buy. Toilet Transfer: Dentist and Hygiene: Furniture conservator/restorer Details (indicate cue type and reason): Patient performed tub transfer with pseudo set up of home environment. Patient did well with shower chair and use of RW by tub to perform transfer. Functional mobility during ADLs: Supervision/safety General ADL Comments: verbal cue for improved technique and safety.     Vision         Perception     Praxis      Pertinent Vitals/Pain Pain Assessment: Faces Pain Score: 4  Pain Location: L knee Pain Descriptors / Indicators: Aching;Sore Pain Intervention(s): Limited activity within patient's tolerance;Monitored during session;Premedicated before session;Repositioned;Ice applied     Hand Dominance Right   Extremity/Trunk Assessment Upper Extremity  Assessment Upper Extremity Assessment: Overall WFL for tasks assessed   Lower Extremity Assessment Lower Extremity Assessment: Defer to PT evaluation   Cervical / Trunk Assessment Cervical / Trunk Assessment: Normal    Communication Communication Communication: No difficulties   Cognition Arousal/Alertness: Awake/alert Behavior During Therapy: WFL for tasks assessed/performed Overall Cognitive Status: Within Functional Limits for tasks assessed                                     General Comments       Exercises Total Joint Exercises Ankle Circles/Pumps: AROM;Both;10 reps;Seated Quad Sets: AROM;Left;10 reps;Seated Heel Slides: AAROM;Left;10 reps;Seated(with towel under foot and min overpressure at ankle from PT for improved knee flexion ROM) Hip ABduction/ADduction: AAROM;Left;10 reps;Seated Goniometric ROM: knee aarom ~10-80*   Shoulder Instructions      Home Living Family/patient expects to be discharged to:: Private residence Living Arrangements: Spouse/significant other Available Help at Discharge: Friend(s) Type of Home: House Home Access: Stairs to enter   Entrance Stairs-Rails: None Home Layout: Two level;Able to live on main level with bedroom/bathroom;Full bath on main level     Bathroom Shower/Tub: Walk-in shower;Tub/shower unit   Bathroom Toilet: Standard Bathroom Accessibility: Yes   Home Equipment: Walker - 2 wheels;Cane - single point;Bedside commode;Shower seat   Additional Comments: Reports bathroom downstairs has glass doors.      Prior Functioning/Environment Level of Independence: Independent        Comments: pt was ambulating with no device prior to surgery however wa hobbling due to pain        OT Problem List: Pain;Decreased knowledge of use of DME or AE;Decreased range of motion      OT Treatment/Interventions:      OT Goals(Current goals can be found in the care plan section) Acute Rehab OT Goals Patient Stated Goal: Regain IND  OT Frequency:     Barriers to D/C:            Co-evaluation              AM-PAC OT "6 Clicks" Daily Activity     Outcome Measure Help from another person eating meals?: None Help from  another person taking care of personal grooming?: None Help from another person toileting, which includes using toliet, bedpan, or urinal?: None Help from another person bathing (including washing, rinsing, drying)?: A Little Help from another person to put on and taking off regular upper body clothing?: None Help from another person to put on and taking off regular lower body clothing?: A Little 6 Click Score: 22   End of Session Equipment Utilized During Treatment: Gait belt;Rolling walker CPM Left Knee CPM Left Knee: Off  Activity Tolerance: Patient tolerated treatment well Patient left: in chair;with call bell/phone within reach;Other (comment)(with legs elevated)  OT Visit Diagnosis: Muscle weakness (generalized) (M62.81);Pain;Other abnormalities of gait and mobility (R26.89) Pain - Right/Left: Left Pain - part of body: Knee                Time: [id number] OT Time Calculation (min): 23 min Charges:  OT General Charges $OT Visit: 1 Visit OT Evaluation $OT Eval Low Complexity: 1 Low OT Treatments $Self Care/Home Management : 8-22 mins  Derl Barrow, OTR/L Acute Care Rehab Services  Office [phone]   Rodney Foster 05/07/2020, 11:38 AM

## 2020-05-07 NOTE — Plan of Care (Signed)
  Problem: Education: Goal: Knowledge of the prescribed therapeutic regimen will improve Outcome: Progressing   Problem: Pain Management: Goal: Pain level will decrease with appropriate interventions Outcome: Progressing   

## 2020-05-08 DIAGNOSIS — B009 Herpesviral infection, unspecified: Secondary | ICD-10-CM | POA: Diagnosis not present

## 2020-05-08 DIAGNOSIS — Z471 Aftercare following joint replacement surgery: Secondary | ICD-10-CM | POA: Diagnosis not present

## 2020-05-08 DIAGNOSIS — H43399 Other vitreous opacities, unspecified eye: Secondary | ICD-10-CM | POA: Diagnosis not present

## 2020-05-08 DIAGNOSIS — B351 Tinea unguium: Secondary | ICD-10-CM | POA: Diagnosis not present

## 2020-05-08 DIAGNOSIS — I10 Essential (primary) hypertension: Secondary | ICD-10-CM | POA: Diagnosis not present

## 2020-05-08 DIAGNOSIS — G4700 Insomnia, unspecified: Secondary | ICD-10-CM | POA: Diagnosis not present

## 2020-05-08 DIAGNOSIS — H9190 Unspecified hearing loss, unspecified ear: Secondary | ICD-10-CM | POA: Diagnosis not present

## 2020-05-08 DIAGNOSIS — M109 Gout, unspecified: Secondary | ICD-10-CM | POA: Diagnosis not present

## 2020-05-08 DIAGNOSIS — J45909 Unspecified asthma, uncomplicated: Secondary | ICD-10-CM | POA: Diagnosis not present

## 2020-05-09 ENCOUNTER — Encounter: Payer: Self-pay | Admitting: *Deleted

## 2020-05-10 DIAGNOSIS — B009 Herpesviral infection, unspecified: Secondary | ICD-10-CM | POA: Diagnosis not present

## 2020-05-10 DIAGNOSIS — M109 Gout, unspecified: Secondary | ICD-10-CM | POA: Diagnosis not present

## 2020-05-10 DIAGNOSIS — Z471 Aftercare following joint replacement surgery: Secondary | ICD-10-CM | POA: Diagnosis not present

## 2020-05-10 DIAGNOSIS — I10 Essential (primary) hypertension: Secondary | ICD-10-CM | POA: Diagnosis not present

## 2020-05-10 DIAGNOSIS — J45909 Unspecified asthma, uncomplicated: Secondary | ICD-10-CM | POA: Diagnosis not present

## 2020-05-10 DIAGNOSIS — G4700 Insomnia, unspecified: Secondary | ICD-10-CM | POA: Diagnosis not present

## 2020-05-10 DIAGNOSIS — H9190 Unspecified hearing loss, unspecified ear: Secondary | ICD-10-CM | POA: Diagnosis not present

## 2020-05-10 DIAGNOSIS — B351 Tinea unguium: Secondary | ICD-10-CM | POA: Diagnosis not present

## 2020-05-10 DIAGNOSIS — H43399 Other vitreous opacities, unspecified eye: Secondary | ICD-10-CM | POA: Diagnosis not present

## 2020-05-10 NOTE — Addendum Note (Signed)
Addendum  created 05/10/20 2014 by Lillia Abed, MD   Child order released for a procedure order, Clinical Note Signed, Intraprocedure Blocks edited, Order Canceled from Note

## 2020-05-10 NOTE — Anesthesia Procedure Notes (Signed)
Anesthesia Regional Block: Adductor canal block   Pre-Anesthetic Checklist: ,, timeout performed, Correct Patient, Correct Site, Correct Laterality, Correct Procedure, Correct Position, site marked, Risks and benefits discussed,  Surgical consent,  Pre-op evaluation,  At surgeon's request and post-op pain management  Laterality: Left  Prep: chloraprep       Needles:  Injection technique: Single-shot  Needle Type: Echogenic Stimulator Needle      Needle Gauge: 21     Additional Needles:   Narrative:  Start time: 05/06/2020 7:10 AM End time: 05/06/2020 7:20 AM Injection made incrementally with aspirations every 5 mL.  Performed by: Personally  Anesthesiologist: Lillia Abed, MD  Additional Notes: Monitors applied. Patient sedated. Sterile prep and drape,hand hygiene and sterile gloves were used. Relevant anatomy identified.Needle position confirmed.Local anesthetic injected incrementally after negative aspiration. Local anesthetic spread visualized around nerve(s). Vascular puncture avoided. No complications. Image printed for medical record.The patient tolerated the procedure well.    Lillia Abed MD

## 2020-05-12 DIAGNOSIS — B351 Tinea unguium: Secondary | ICD-10-CM | POA: Diagnosis not present

## 2020-05-12 DIAGNOSIS — H43399 Other vitreous opacities, unspecified eye: Secondary | ICD-10-CM | POA: Diagnosis not present

## 2020-05-12 DIAGNOSIS — H9190 Unspecified hearing loss, unspecified ear: Secondary | ICD-10-CM | POA: Diagnosis not present

## 2020-05-12 DIAGNOSIS — Z471 Aftercare following joint replacement surgery: Secondary | ICD-10-CM | POA: Diagnosis not present

## 2020-05-12 DIAGNOSIS — M109 Gout, unspecified: Secondary | ICD-10-CM | POA: Diagnosis not present

## 2020-05-12 DIAGNOSIS — I10 Essential (primary) hypertension: Secondary | ICD-10-CM | POA: Diagnosis not present

## 2020-05-12 DIAGNOSIS — J45909 Unspecified asthma, uncomplicated: Secondary | ICD-10-CM | POA: Diagnosis not present

## 2020-05-12 DIAGNOSIS — G4700 Insomnia, unspecified: Secondary | ICD-10-CM | POA: Diagnosis not present

## 2020-05-12 DIAGNOSIS — B009 Herpesviral infection, unspecified: Secondary | ICD-10-CM | POA: Diagnosis not present

## 2020-05-17 DIAGNOSIS — H43399 Other vitreous opacities, unspecified eye: Secondary | ICD-10-CM | POA: Diagnosis not present

## 2020-05-17 DIAGNOSIS — I10 Essential (primary) hypertension: Secondary | ICD-10-CM | POA: Diagnosis not present

## 2020-05-17 DIAGNOSIS — B351 Tinea unguium: Secondary | ICD-10-CM | POA: Diagnosis not present

## 2020-05-17 DIAGNOSIS — Z471 Aftercare following joint replacement surgery: Secondary | ICD-10-CM | POA: Diagnosis not present

## 2020-05-17 DIAGNOSIS — H9190 Unspecified hearing loss, unspecified ear: Secondary | ICD-10-CM | POA: Diagnosis not present

## 2020-05-17 DIAGNOSIS — J45909 Unspecified asthma, uncomplicated: Secondary | ICD-10-CM | POA: Diagnosis not present

## 2020-05-17 DIAGNOSIS — G4700 Insomnia, unspecified: Secondary | ICD-10-CM | POA: Diagnosis not present

## 2020-05-17 DIAGNOSIS — B009 Herpesviral infection, unspecified: Secondary | ICD-10-CM | POA: Diagnosis not present

## 2020-05-17 DIAGNOSIS — M109 Gout, unspecified: Secondary | ICD-10-CM | POA: Diagnosis not present

## 2020-05-20 DIAGNOSIS — B009 Herpesviral infection, unspecified: Secondary | ICD-10-CM | POA: Diagnosis not present

## 2020-05-20 DIAGNOSIS — H9190 Unspecified hearing loss, unspecified ear: Secondary | ICD-10-CM | POA: Diagnosis not present

## 2020-05-20 DIAGNOSIS — G4700 Insomnia, unspecified: Secondary | ICD-10-CM | POA: Diagnosis not present

## 2020-05-20 DIAGNOSIS — M109 Gout, unspecified: Secondary | ICD-10-CM | POA: Diagnosis not present

## 2020-05-20 DIAGNOSIS — J45909 Unspecified asthma, uncomplicated: Secondary | ICD-10-CM | POA: Diagnosis not present

## 2020-05-20 DIAGNOSIS — B351 Tinea unguium: Secondary | ICD-10-CM | POA: Diagnosis not present

## 2020-05-20 DIAGNOSIS — Z471 Aftercare following joint replacement surgery: Secondary | ICD-10-CM | POA: Diagnosis not present

## 2020-05-20 DIAGNOSIS — I10 Essential (primary) hypertension: Secondary | ICD-10-CM | POA: Diagnosis not present

## 2020-05-20 DIAGNOSIS — H43399 Other vitreous opacities, unspecified eye: Secondary | ICD-10-CM | POA: Diagnosis not present

## 2020-05-24 DIAGNOSIS — M6281 Muscle weakness (generalized): Secondary | ICD-10-CM | POA: Diagnosis not present

## 2020-05-24 DIAGNOSIS — M25662 Stiffness of left knee, not elsewhere classified: Secondary | ICD-10-CM | POA: Diagnosis not present

## 2020-05-24 DIAGNOSIS — M1712 Unilateral primary osteoarthritis, left knee: Secondary | ICD-10-CM | POA: Diagnosis not present

## 2020-05-24 DIAGNOSIS — M25562 Pain in left knee: Secondary | ICD-10-CM | POA: Diagnosis not present

## 2020-05-31 DIAGNOSIS — M6281 Muscle weakness (generalized): Secondary | ICD-10-CM | POA: Diagnosis not present

## 2020-05-31 DIAGNOSIS — M25562 Pain in left knee: Secondary | ICD-10-CM | POA: Diagnosis not present

## 2020-05-31 DIAGNOSIS — M25662 Stiffness of left knee, not elsewhere classified: Secondary | ICD-10-CM | POA: Diagnosis not present

## 2020-05-31 DIAGNOSIS — M1712 Unilateral primary osteoarthritis, left knee: Secondary | ICD-10-CM | POA: Diagnosis not present

## 2020-06-02 DIAGNOSIS — M25562 Pain in left knee: Secondary | ICD-10-CM | POA: Diagnosis not present

## 2020-06-02 DIAGNOSIS — M1712 Unilateral primary osteoarthritis, left knee: Secondary | ICD-10-CM | POA: Diagnosis not present

## 2020-06-02 DIAGNOSIS — M6281 Muscle weakness (generalized): Secondary | ICD-10-CM | POA: Diagnosis not present

## 2020-06-02 DIAGNOSIS — M25662 Stiffness of left knee, not elsewhere classified: Secondary | ICD-10-CM | POA: Diagnosis not present

## 2020-06-05 ENCOUNTER — Other Ambulatory Visit: Payer: Self-pay | Admitting: Nurse Practitioner

## 2020-06-07 DIAGNOSIS — M1712 Unilateral primary osteoarthritis, left knee: Secondary | ICD-10-CM | POA: Diagnosis not present

## 2020-06-07 DIAGNOSIS — M25562 Pain in left knee: Secondary | ICD-10-CM | POA: Diagnosis not present

## 2020-06-07 DIAGNOSIS — M6281 Muscle weakness (generalized): Secondary | ICD-10-CM | POA: Diagnosis not present

## 2020-06-07 DIAGNOSIS — M25662 Stiffness of left knee, not elsewhere classified: Secondary | ICD-10-CM | POA: Diagnosis not present

## 2020-06-14 DIAGNOSIS — M1712 Unilateral primary osteoarthritis, left knee: Secondary | ICD-10-CM | POA: Diagnosis not present

## 2020-06-14 DIAGNOSIS — M25662 Stiffness of left knee, not elsewhere classified: Secondary | ICD-10-CM | POA: Diagnosis not present

## 2020-06-14 DIAGNOSIS — M25562 Pain in left knee: Secondary | ICD-10-CM | POA: Diagnosis not present

## 2020-06-14 DIAGNOSIS — M6281 Muscle weakness (generalized): Secondary | ICD-10-CM | POA: Diagnosis not present

## 2020-06-16 DIAGNOSIS — M6281 Muscle weakness (generalized): Secondary | ICD-10-CM | POA: Diagnosis not present

## 2020-06-16 DIAGNOSIS — M25562 Pain in left knee: Secondary | ICD-10-CM | POA: Diagnosis not present

## 2020-06-16 DIAGNOSIS — M25662 Stiffness of left knee, not elsewhere classified: Secondary | ICD-10-CM | POA: Diagnosis not present

## 2020-06-16 DIAGNOSIS — M1712 Unilateral primary osteoarthritis, left knee: Secondary | ICD-10-CM | POA: Diagnosis not present

## 2020-06-20 DIAGNOSIS — G4733 Obstructive sleep apnea (adult) (pediatric): Secondary | ICD-10-CM | POA: Diagnosis not present

## 2020-06-20 DIAGNOSIS — G4730 Sleep apnea, unspecified: Secondary | ICD-10-CM | POA: Diagnosis not present

## 2020-06-22 DIAGNOSIS — M25562 Pain in left knee: Secondary | ICD-10-CM | POA: Diagnosis not present

## 2020-06-22 DIAGNOSIS — M6281 Muscle weakness (generalized): Secondary | ICD-10-CM | POA: Diagnosis not present

## 2020-06-22 DIAGNOSIS — M25662 Stiffness of left knee, not elsewhere classified: Secondary | ICD-10-CM | POA: Diagnosis not present

## 2020-06-22 DIAGNOSIS — M1712 Unilateral primary osteoarthritis, left knee: Secondary | ICD-10-CM | POA: Diagnosis not present

## 2020-06-24 DIAGNOSIS — M1712 Unilateral primary osteoarthritis, left knee: Secondary | ICD-10-CM | POA: Diagnosis not present

## 2020-06-24 DIAGNOSIS — M25662 Stiffness of left knee, not elsewhere classified: Secondary | ICD-10-CM | POA: Diagnosis not present

## 2020-06-24 DIAGNOSIS — M25562 Pain in left knee: Secondary | ICD-10-CM | POA: Diagnosis not present

## 2020-06-24 DIAGNOSIS — M6281 Muscle weakness (generalized): Secondary | ICD-10-CM | POA: Diagnosis not present

## 2020-06-27 DIAGNOSIS — M25662 Stiffness of left knee, not elsewhere classified: Secondary | ICD-10-CM | POA: Diagnosis not present

## 2020-06-27 DIAGNOSIS — M25562 Pain in left knee: Secondary | ICD-10-CM | POA: Diagnosis not present

## 2020-06-27 DIAGNOSIS — M6281 Muscle weakness (generalized): Secondary | ICD-10-CM | POA: Diagnosis not present

## 2020-06-27 DIAGNOSIS — M1712 Unilateral primary osteoarthritis, left knee: Secondary | ICD-10-CM | POA: Diagnosis not present

## 2020-06-29 DIAGNOSIS — M25662 Stiffness of left knee, not elsewhere classified: Secondary | ICD-10-CM | POA: Diagnosis not present

## 2020-06-29 DIAGNOSIS — M1712 Unilateral primary osteoarthritis, left knee: Secondary | ICD-10-CM | POA: Diagnosis not present

## 2020-06-29 DIAGNOSIS — M6281 Muscle weakness (generalized): Secondary | ICD-10-CM | POA: Diagnosis not present

## 2020-06-29 DIAGNOSIS — M25562 Pain in left knee: Secondary | ICD-10-CM | POA: Diagnosis not present

## 2020-07-06 DIAGNOSIS — M25562 Pain in left knee: Secondary | ICD-10-CM | POA: Diagnosis not present

## 2020-07-06 DIAGNOSIS — M1712 Unilateral primary osteoarthritis, left knee: Secondary | ICD-10-CM | POA: Diagnosis not present

## 2020-07-06 DIAGNOSIS — M25662 Stiffness of left knee, not elsewhere classified: Secondary | ICD-10-CM | POA: Diagnosis not present

## 2020-07-06 DIAGNOSIS — M6281 Muscle weakness (generalized): Secondary | ICD-10-CM | POA: Diagnosis not present

## 2020-07-19 DIAGNOSIS — M1712 Unilateral primary osteoarthritis, left knee: Secondary | ICD-10-CM | POA: Diagnosis not present

## 2020-07-19 DIAGNOSIS — M25662 Stiffness of left knee, not elsewhere classified: Secondary | ICD-10-CM | POA: Diagnosis not present

## 2020-07-19 DIAGNOSIS — M25562 Pain in left knee: Secondary | ICD-10-CM | POA: Diagnosis not present

## 2020-07-25 DIAGNOSIS — M1712 Unilateral primary osteoarthritis, left knee: Secondary | ICD-10-CM | POA: Diagnosis not present

## 2020-07-25 DIAGNOSIS — M25662 Stiffness of left knee, not elsewhere classified: Secondary | ICD-10-CM | POA: Diagnosis not present

## 2020-07-25 DIAGNOSIS — M6281 Muscle weakness (generalized): Secondary | ICD-10-CM | POA: Diagnosis not present

## 2020-07-25 DIAGNOSIS — M25562 Pain in left knee: Secondary | ICD-10-CM | POA: Diagnosis not present

## 2020-07-28 ENCOUNTER — Other Ambulatory Visit: Payer: Self-pay | Admitting: *Deleted

## 2020-07-28 DIAGNOSIS — E782 Mixed hyperlipidemia: Secondary | ICD-10-CM

## 2020-07-28 MED ORDER — ATORVASTATIN CALCIUM 20 MG PO TABS: 20.0000 mg | ORAL_TABLET | Freq: Every day | ORAL | 1 refills | Status: DC

## 2020-07-28 NOTE — Telephone Encounter (Signed)
Patient requested refill

## 2020-08-12 ENCOUNTER — Other Ambulatory Visit: Payer: Self-pay | Admitting: Nurse Practitioner

## 2020-09-06 DIAGNOSIS — M1A9XX0 Chronic gout, unspecified, without tophus (tophi): Secondary | ICD-10-CM

## 2020-09-06 NOTE — Telephone Encounter (Signed)
This encounter was created in error - please disregard.

## 2020-09-08 ENCOUNTER — Other Ambulatory Visit: Payer: Self-pay | Admitting: *Deleted

## 2020-09-08 MED ORDER — AMLODIPINE BESYLATE 5 MG PO TABS: ORAL_TABLET | ORAL | 1 refills | Status: DC

## 2020-09-08 MED ORDER — ACYCLOVIR 400 MG PO TABS
400.0000 mg | ORAL_TABLET | Freq: Every day | ORAL | 1 refills | Status: DC
Start: 2020-09-08 — End: 2021-02-20

## 2020-09-08 NOTE — Telephone Encounter (Signed)
Patient requested refills.

## 2020-09-15 ENCOUNTER — Encounter: Payer: Self-pay | Admitting: Nurse Practitioner

## 2020-09-15 ENCOUNTER — Telehealth: Payer: Self-pay

## 2020-09-15 ENCOUNTER — Other Ambulatory Visit: Payer: Self-pay

## 2020-09-15 ENCOUNTER — Ambulatory Visit (INDEPENDENT_AMBULATORY_CARE_PROVIDER_SITE_OTHER): Payer: Medicare HMO | Admitting: Nurse Practitioner

## 2020-09-15 DIAGNOSIS — Z0000 Encounter for general adult medical examination without abnormal findings: Secondary | ICD-10-CM | POA: Diagnosis not present

## 2020-09-15 NOTE — Telephone Encounter (Signed)
Mr. geraldine, tesar are scheduled for a virtual visit with your provider today.    Just as we do with appointments in the office, we must obtain your consent to participate.  Your consent will be active for this visit and any virtual visit you may have with one of our providers in the next 365 days.    If you have a MyChart account, I can also send a copy of this consent to you electronically.  All virtual visits are billed to your insurance company just like a traditional visit in the office.  As this is a virtual visit, video technology does not allow for your provider to perform a traditional examination.  This may limit your provider's ability to fully assess your condition.  If your provider identifies any concerns that need to be evaluated in person or the need to arrange testing such as labs, EKG, etc, we will make arrangements to do so.    Although advances in technology are sophisticated, we cannot ensure that it will always work on either your end or our end.  If the connection with a video visit is poor, we may have to switch to a telephone visit.  With either a video or telephone visit, we are not always able to ensure that we have a secure connection.   I need to obtain your verbal consent now.   Are you willing to proceed with your visit today?   SABAS FRETT has provided verbal consent on 09/15/2020 for a virtual visit (video or telephone).   Carroll Kinds, [facility] 09/15/2020  9:34 AM

## 2020-09-15 NOTE — Progress Notes (Signed)
This service is provided via telemedicine  No vital signs collected/recorded due to the encounter was a telemedicine visit.   Location of patient (ex: home, work):  Home  Patient consents to a telephone visit:  Yes, see encounter dated 09/15/2020  Location of the provider (ex: office, home):  [place]  Name of any referring provider:  N/A  Names of all persons participating in the telemedicine service and their role in the encounter:  Sherrie Mustache, Nurse Practitioner, Carroll Kinds, CMA, and patient.   Time spent on call:  10 minutes with medical assistant

## 2020-09-15 NOTE — Progress Notes (Signed)
Subjective:   Rodney Foster is a 70 y.o. male who presents for Medicare Annual/Subsequent preventive examination.  Review of Systems     Cardiac Risk Factors include: advanced age (>55men, >52 women);obesity (BMI >30kg/m2);hypertension;dyslipidemia     Objective:    There were no vitals filed for this visit. There is no height or weight on file to calculate BMI.  Advanced Directives 09/15/2020 05/06/2020 05/06/2020 04/25/2020 04/11/2020 01/11/2020 09/25/2019  Does Patient Have a Medical Advance Directive? Yes - Yes Yes Yes Yes Yes  Type of Advance Directive Living will [place];Living will [place];Living will Living will Living will Living will [place]  Does patient want to make changes to medical advance directive? No - Patient declined No - Patient declined - No - Patient declined No - Patient declined No - Patient declined No - Patient declined  Copy of Healthcare Power of Attorney in Chart? - No - copy requested No - copy requested - - - No - copy requested  Would patient like information on creating a medical advance directive? - - - - - - -    Current Medications (verified) Outpatient Encounter Medications as of 09/15/2020  Medication Sig  . acyclovir (ZOVIRAX) 400 MG tablet Take 1 tablet (400 mg total) by mouth daily.  Marland Kitchen amLODipine (NORVASC) 5 MG tablet Take one tablet by mouth once daily to control blood pressure  . atorvastatin (LIPITOR) 20 MG tablet Take 1 tablet (20 mg total) by mouth at bedtime.  . febuxostat (ULORIC) 40 MG tablet Take 1 tablet (40 mg total) by mouth daily.  Marland Kitchen guaiFENesin (MUCINEX) 600 MG 12 hr tablet Take 600-1,200 mg by mouth at bedtime as needed for cough or to loosen phlegm.  Marland Kitchen losartan (COZAAR) 100 MG tablet TAKE ONE TABLET BY MOUTH ONCE DAILY TO CONTROL BLOOD PRESSURE  . Multiple Vitamins-Minerals (ANTIOXIDANT PO) Take 30 mLs by mouth daily. Aronia Berry Juice   . omeprazole (PRILOSEC) 20 MG  capsule Take 20 mg by mouth daily.  . tadalafil (CIALIS) 20 MG tablet Take 1 tablet (20 mg total) by mouth daily as needed for erectile dysfunction.  Marland Kitchen zolpidem (AMBIEN) 5 MG tablet Take one tablet by mouth at bedtime as needed for sleep (Patient taking differently: Take by mouth at bedtime as needed for sleep. Take one tablet by mouth at bedtime as needed for sleep)  . [DISCONTINUED] acetaminophen (TYLENOL) 325 MG tablet Take 2 tablets (650 mg total) by mouth every 4 (four) hours as needed.  . [DISCONTINUED] docusate sodium (COLACE) 100 MG capsule Take 1 capsule (100 mg total) by mouth daily as needed.  . [DISCONTINUED] gabapentin (NEURONTIN) 300 MG capsule Take 1 capsule (300 mg total) by mouth 3 (three) times daily for 14 days, THEN 1 capsule (300 mg total) 2 (two) times daily for 3 days, THEN 1 capsule (300 mg total) daily for 4 days.  . [DISCONTINUED] oxyCODONE (OXY IR/ROXICODONE) 5 MG immediate release tablet Take one tab po q4-6hrs prn pain, may need 1-2 first couple weeks   No facility-administered encounter medications on file as of 09/15/2020.    Allergies (verified) Patient has no known allergies.   History: Past Medical History:  Diagnosis Date  . Backache, unspecified   . Bronchitis   . Cough variant asthma 05/03/2017  . Genital herpes   . Gout   . Hearing loss   . Hemorrhoids   . Hernia   . Herpes simplex   . Hyperglycemia   .  Hyperlipidemia   . Hypertension   . Impotence   . Insomnia, unspecified   . Osteoarthrosis, unspecified whether generalized or localized, unspecified site   . Tinea pedis, right   . Unspecified disorder of male genital organs    Past Surgical History:  Procedure Laterality Date  . fracture left femur  1984  . ganglion repair of left forarm  2000  . IVP  72   ML old comp fracture  . KNEE SURGERY Right 01/21/2019   [facility] Ip   . NASAL SEPTUM SURGERY  1997  . TONSILLECTOMY  1959  . TONSILLECTOMY    . TOTAL KNEE ARTHROPLASTY Right  09/25/2019   Procedure: TOTAL KNEE ARTHROPLASTY;  Surgeon: Earlie Server, MD;  Location: [facility];  Service: Orthopedics;  Laterality: Right;  . TOTAL KNEE ARTHROPLASTY Left 05/06/2020   Procedure: TOTAL KNEE ARTHROPLASTY;  Surgeon: Earlie Server, MD;  Location: [facility];  Service: Orthopedics;  Laterality: Left;  . UMBILICAL HERNIA REPAIR  2002   Family History  Problem Relation Age of Onset  . COPD Mother   . Aortic aneurysm Father    Social History   Socioeconomic History  . Marital status: Divorced    Spouse name: Not on file  . Number of children: Not on file  . Years of education: Not on file  . Highest education level: Not on file  Occupational History  . Not on file  Tobacco Use  . Smoking status: Former Smoker    Packs/day: 1.00    Years: 15.00    Pack years: 15.00    Quit date: 01/01/1984    Years since quitting: 36.7  . Smokeless tobacco: Former Systems developer    Quit date: Biochemist, clinical  . Vaping Use: Never used  Substance and Sexual Activity  . Alcohol use: Yes    Alcohol/week: 6.0 - 7.0 standard drinks    Types: 6 - 7 Glasses of wine per week    Comment: 3-4 a night of wine or cocktail  . Drug use: No  . Sexual activity: Yes  Other Topics Concern  . Not on file  Social History Narrative  . Not on file   Social Determinants of Health   Financial Resource Strain:   . Difficulty of Paying Living Expenses: Not on file  Food Insecurity:   . Worried About Charity fundraiser in the Last Year: Not on file  . Ran Out of Food in the Last Year: Not on file  Transportation Needs:   . Lack of Transportation (Medical): Not on file  . Lack of Transportation (Non-Medical): Not on file  Physical Activity:   . Days of Exercise per Week: Not on file  . Minutes of Exercise per Session: Not on file  Stress:   . Feeling of Stress : Not on file  Social Connections:   . Frequency of Communication with Friends and Family: Not on file  . Frequency of Social Gatherings with  Friends and Family: Not on file  . Attends Religious Services: Not on file  . Active Member of Clubs or Organizations: Not on file  . Attends Archivist Meetings: Not on file  . Marital Status: Not on file    Tobacco Counseling Counseling given: Not Answered   Clinical Intake:  Pre-visit preparation completed: Yes  Pain : No/denies pain     BMI - recorded: 31 Nutritional Status: BMI > 30  Obese Nutritional Risks: None Diabetes: No  How often do you need to  have someone help you when you read instructions, pamphlets, or other written materials from your doctor or pharmacy?: 1 - Never  Diabetic?no         Activities of Daily Living In your present state of health, do you have any difficulty performing the following activities: 09/15/2020 05/06/2020  Hearing? N N  Vision? N N  Difficulty concentrating or making decisions? N N  Walking or climbing stairs? N N  Dressing or bathing? N N  Doing errands, shopping? N N  Preparing Food and eating ? N -  Using the Toilet? N -  In the past six months, have you accidently leaked urine? N -  Do you have problems with loss of bowel control? N -  Managing your Medications? N -  Managing your Finances? N -  Housekeeping or managing your Housekeeping? N -  Some recent data might be hidden    Patient Care Team: [name], NP as PCP - General (Geriatric Medicine)  Indicate any recent Medical Services you may have received from other than [facility] providers in the past year (date may be approximate).     Assessment:   This is a routine wellness examination for Rodney Foster.  Hearing/Vision screen  Hearing Screening   125Hz  250Hz  500Hz  1000Hz  2000Hz  3000Hz  4000Hz  6000Hz  8000Hz   Right ear:           Left ear:           Comments: Patient has no hearing problems.  Vision Screening Comments: Patient has no vision problems. Has not had a recent eye exam  Dietary issues and exercise activities discussed: Current  Exercise Habits: Home exercise routine, Type of exercise: strength training/weights, Time (Minutes): 45, Frequency (Times/Week): 3, Weekly Exercise (Minutes/Week): 135  Goals    . Exercise 3x per week (30 min per time)     Patient will go back to the gym    . Weight (lb) < 200 lb (90.7 kg)     With increase in exercise and eating better.       Depression Screen PHQ 2/9 Scores 09/15/2020 09/15/2019 09/15/2019 09/05/2018 06/02/2018 08/26/2017 10/10/2016  PHQ - 2 Score 0 0 0 0 0 0 0    Fall Risk Fall Risk  09/15/2020 04/11/2020 01/11/2020 09/15/2019 09/15/2019  Falls in the past year? 0 0 0 0 0  Number falls in past yr: 0 0 0 - -  Comment - - - - -  Injury with Fall? 0 0 0 - -  Comment - - - - -    Any stairs in or around the home? Yes  If so, are there any without handrails? No  Home free of loose throw rugs in walkways, pet beds, electrical cords, etc? Yes  Adequate lighting in your home to reduce risk of falls? Yes   ASSISTIVE DEVICES UTILIZED TO PREVENT FALLS:  Life alert? No  Use of a cane, walker or w/c? No  Grab bars in the bathroom? No  Shower chair or bench in shower? No  Elevated toilet seat or a handicapped toilet? No   TIMED UP AND GO:    Cognitive Function: MMSE - Mini Mental State Exam 09/05/2018 08/26/2017 10/10/2016  Orientation to time 5 5 5   Orientation to Place 5 5 5   Registration 3 3 3   Attention/ Calculation 5 5 5   Recall 2 2 3   Language- name 2 objects 2 2 2   Language- repeat 1 1 1   Language- follow 3 step command 3 2 3  Language- read & follow direction 1 1 1   Write a sentence 1 1 1   Copy design 1 1 1   Total score 29 28 30      6CIT Screen 09/15/2020 09/15/2019  What Year? 0 points 0 points  What month? 0 points 0 points  What time? 0 points 0 points  Count back from 20 0 points 0 points  Months in reverse 0 points 0 points  Repeat phrase 0 points 0 points  Total Score 0 0    Immunizations Immunization History  Administered Date(s) Administered    . Fluad Quad(high Dose 65+) 08/20/2019  . Influenza, High Dose Seasonal PF 10/17/2017, 09/05/2018  . Influenza,inj,Quad PF,6+ Mos 12/02/2013, 03/07/2016, 10/10/2016  . Influenza-Unspecified 12/05/2011  . PFIZER SARS-COV-2 Vaccination 01/22/2020, 02/12/2020  . Pneumococcal Conjugate-13 08/26/2017  . Pneumococcal Polysaccharide-23 09/05/2018  . Td 12/31/1994, 12/03/2013  . Zoster Recombinat (Shingrix) 04/15/2018, 08/04/2018    TDAP status: Due, Education has been provided regarding the importance of this vaccine. Advised may receive this vaccine at local pharmacy or Health Dept. Aware to provide a copy of the vaccination record if obtained from local pharmacy or Health Dept. Verbalized acceptance and understanding. FLU DUE AT THIS TIME Pneumococcal vaccine status: Up to date Covid-19 vaccine status: Completed vaccines  Qualifies for Shingles Vaccine? Yes   Zostavax completed No   Shingrix Completed?: Yes  Screening Tests Health Maintenance  Topic Date Due  . INFLUENZA VACCINE  07/31/2020  . COLONOSCOPY  11/18/2022  . TETANUS/TDAP  12/04/2023  . COVID-19 Vaccine  Completed  . Hepatitis C Screening  Completed  . PNA vac Low Risk Adult  Completed    Health Maintenance  Health Maintenance Due  Topic Date Due  . INFLUENZA VACCINE  07/31/2020    Colorectal cancer screening: Completed 2013. Repeat every 10 years  Lung Cancer Screening: (Low Dose CT Chest recommended if Age 70-80 years, 30 pack-year currently smoking OR have quit w/in 15years.) does not qualify.   Lung Cancer Screening Referral: na  Additional Screening:  Hepatitis C Screening: does not qualify; Completed complleted 2020  Vision Screening: Recommended annual ophthalmology exams for early detection of glaucoma and other disorders of the eye. Is the patient up to date with their annual eye exam?  Yes  Who is the provider or what is the name of the office in which the patient attends annual eye exams?  unknown If pt is not established with a provider, would they like to be referred to a provider to establish care? No .   Dental Screening: Recommended annual dental exams for proper oral hygiene  Community Resource Referral / Chronic Care Management: CRR required this visit?  No   CCM required this visit?  No      Plan:     I have personally reviewed and noted the following in the patient's chart:   . Medical and social history . Use of alcohol, tobacco or illicit drugs  . Current medications and supplements . Functional ability and status . Nutritional status . Physical activity . Advanced directives . List of other physicians . Hospitalizations, surgeries, and ER visits in previous 12 months . Vitals . Screenings to include cognitive, depression, and falls . Referrals and appointments  In addition, I have reviewed and discussed with patient certain preventive protocols, quality metrics, and best practice recommendations. A written personalized care plan for preventive services as well as general preventive health recommendations were provided to patient.     [name], NP  09/15/2020    Virtual Visit via Telephone Note  I connected with@ on 09/15/20 at  9:30 AM EDT by telephone and verified that I am speaking with the correct person using two identifiers.  Location: Patient: home Provider: [facility]    I discussed the limitations, risks, security and privacy concerns of performing an evaluation and management service by telephone and the availability of in person appointments. I also discussed with the patient that there may be a patient responsible charge related to this service. The patient expressed understanding and agreed to proceed.   I discussed the assessment and treatment plan with the patient. The patient was provided an opportunity to ask questions and all were answered. The patient agreed with the plan and demonstrated an understanding of  the instructions.   The patient was advised to call back or seek an in-person evaluation if the symptoms worsen or if the condition fails to improve as anticipated.  I provided 17 minutes of non-face-to-face time during this encounter.  Carlos American. Harle Battiest Avs printed and mailed

## 2020-09-15 NOTE — Patient Instructions (Signed)
Rodney Foster , Thank you for taking time to come for your Medicare Wellness Visit. I appreciate your ongoing commitment to your health goals. Please review the following plan we discussed and let me know if I can assist you in the future.   Screening recommendations/referrals: Colonoscopy up to date, due 2023 Recommended yearly ophthalmology/optometry visit for glaucoma screening and checkup Recommended yearly dental visit for hygiene and checkup  Vaccinations: Influenza vaccine to get at your next appt, due at this time. Pneumococcal vaccine up to date Tdap vaccine up to date Shingles vaccine up tod ate    Advanced directives:  To complete advance directives and review MOST form, we can complete MOST form in office today  Conditions/risks identified: advance age, obesity, hypertension, hyperlipidemia  Next appointment: 1 year  Preventive Care 72 Years and Older, Male Preventive care refers to lifestyle choices and visits with your health care provider that can promote health and wellness. What does preventive care include?  A yearly physical exam. This is also called an annual well check.  Dental exams once or twice a year.  Routine eye exams. Ask your health care provider how often you should have your eyes checked.  Personal lifestyle choices, including:  Daily care of your teeth and gums.  Regular physical activity.  Eating a healthy diet.  Avoiding tobacco and drug use.  Limiting alcohol use.  Practicing safe sex.  Taking low doses of aspirin every day.  Taking vitamin and mineral supplements as recommended by your health care provider. What happens during an annual well check? The services and screenings done by your health care provider during your annual well check will depend on your age, overall health, lifestyle risk factors, and family history of disease. Counseling  Your health care provider may ask you questions about your:  Alcohol use.  Tobacco  use.  Drug use.  Emotional well-being.  Home and relationship well-being.  Sexual activity.  Eating habits.  History of falls.  Memory and ability to understand (cognition).  Work and work Statistician. Screening  You may have the following tests or measurements:  Height, weight, and BMI.  Blood pressure.  Lipid and cholesterol levels. These may be checked every 5 years, or more frequently if you are over 27 years old.  Skin check.  Lung cancer screening. You may have this screening every year starting at age 66 if you have a 30-pack-year history of smoking and currently smoke or have quit within the past 15 years.  Fecal occult blood test (FOBT) of the stool. You may have this test every year starting at age 67.  Flexible sigmoidoscopy or colonoscopy. You may have a sigmoidoscopy every 5 years or a colonoscopy every 10 years starting at age 70.  Prostate cancer screening. Recommendations will vary depending on your family history and other risks.  Hepatitis C blood test.  Hepatitis B blood test.  Sexually transmitted disease (STD) testing.  Diabetes screening. This is done by checking your blood sugar (glucose) after you have not eaten for a while (fasting). You may have this done every 1-3 years.  Abdominal aortic aneurysm (AAA) screening. You may need this if you are a current or former smoker.  Osteoporosis. You may be screened starting at age 89 if you are at high risk. Talk with your health care provider about your test results, treatment options, and if necessary, the need for more tests. Vaccines  Your health care provider may recommend certain vaccines, such as:  Influenza vaccine. This  is recommended every year.  Tetanus, diphtheria, and acellular pertussis (Tdap, Td) vaccine. You may need a Td booster every 10 years.  Zoster vaccine. You may need this after age 32.  Pneumococcal 13-valent conjugate (PCV13) vaccine. One dose is recommended after age  58.  Pneumococcal polysaccharide (PPSV23) vaccine. One dose is recommended after age 15. Talk to your health care provider about which screenings and vaccines you need and how often you need them. This information is not intended to replace advice given to you by your health care provider. Make sure you discuss any questions you have with your health care provider. Document Released: 01/13/2016 Document Revised: 09/05/2016 Document Reviewed: 10/18/2015 Elsevier Interactive Patient Education  2017 [place] Prevention in the Home Falls can cause injuries. They can happen to people of all ages. There are many things you can do to make your home safe and to help prevent falls. What can I do on the outside of my home?  Regularly fix the edges of walkways and driveways and fix any cracks.  Remove anything that might make you trip as you walk through a door, such as a raised step or threshold.  Trim any bushes or trees on the path to your home.  Use bright outdoor lighting.  Clear any walking paths of anything that might make someone trip, such as rocks or tools.  Regularly check to see if handrails are loose or broken. Make sure that both sides of any steps have handrails.  Any raised decks and porches should have guardrails on the edges.  Have any leaves, snow, or ice cleared regularly.  Use sand or salt on walking paths during winter.  Clean up any spills in your garage right away. This includes oil or grease spills. What can I do in the bathroom?  Use night lights.  Install grab bars by the toilet and in the tub and shower. Do not use towel bars as grab bars.  Use non-skid mats or decals in the tub or shower.  If you need to sit down in the shower, use a plastic, non-slip stool.  Keep the floor dry. Clean up any water that spills on the floor as soon as it happens.  Remove soap buildup in the tub or shower regularly.  Attach bath mats securely with double-sided  non-slip rug tape.  Do not have throw rugs and other things on the floor that can make you trip. What can I do in the bedroom?  Use night lights.  Make sure that you have a light by your bed that is easy to reach.  Do not use any sheets or blankets that are too big for your bed. They should not hang down onto the floor.  Have a firm chair that has side arms. You can use this for support while you get dressed.  Do not have throw rugs and other things on the floor that can make you trip. What can I do in the kitchen?  Clean up any spills right away.  Avoid walking on wet floors.  Keep items that you use a lot in easy-to-reach places.  If you need to reach something above you, use a strong step stool that has a grab bar.  Keep electrical cords out of the way.  Do not use floor polish or wax that makes floors slippery. If you must use wax, use non-skid floor wax.  Do not have throw rugs and other things on the floor that can make you  trip. What can I do with my stairs?  Do not leave any items on the stairs.  Make sure that there are handrails on both sides of the stairs and use them. Fix handrails that are broken or loose. Make sure that handrails are as long as the stairways.  Check any carpeting to make sure that it is firmly attached to the stairs. Fix any carpet that is loose or worn.  Avoid having throw rugs at the top or bottom of the stairs. If you do have throw rugs, attach them to the floor with carpet tape.  Make sure that you have a light switch at the top of the stairs and the bottom of the stairs. If you do not have them, ask someone to add them for you. What else can I do to help prevent falls?  Wear shoes that:  Do not have high heels.  Have rubber bottoms.  Are comfortable and fit you well.  Are closed at the toe. Do not wear sandals.  If you use a stepladder:  Make sure that it is fully opened. Do not climb a closed stepladder.  Make sure that both  sides of the stepladder are locked into place.  Ask someone to hold it for you, if possible.  Clearly mark and make sure that you can see:  Any grab bars or handrails.  First and last steps.  Where the edge of each step is.  Use tools that help you move around (mobility aids) if they are needed. These include:  Canes.  Walkers.  Scooters.  Crutches.  Turn on the lights when you go into a dark area. Replace any light bulbs as soon as they burn out.  Set up your furniture so you have a clear path. Avoid moving your furniture around.  If any of your floors are uneven, fix them.  If there are any pets around you, be aware of where they are.  Review your medicines with your doctor. Some medicines can make you feel dizzy. This can increase your chance of falling. Ask your doctor what other things that you can do to help prevent falls. This information is not intended to replace advice given to you by your health care provider. Make sure you discuss any questions you have with your health care provider. Document Released: 10/13/2009 Document Revised: 05/24/2016 Document Reviewed: 01/21/2015 Elsevier Interactive Patient Education  2017 [employer].

## 2020-09-16 ENCOUNTER — Telehealth: Payer: Self-pay | Admitting: Nurse Practitioner

## 2020-09-16 NOTE — Telephone Encounter (Signed)
I called the patient to schedule his next AWV his last one was done on 09/15/20. I was unable to reach them or leave a vm

## 2020-09-19 DIAGNOSIS — G4733 Obstructive sleep apnea (adult) (pediatric): Secondary | ICD-10-CM | POA: Diagnosis not present

## 2020-09-19 DIAGNOSIS — G4730 Sleep apnea, unspecified: Secondary | ICD-10-CM | POA: Diagnosis not present

## 2020-10-10 ENCOUNTER — Other Ambulatory Visit: Payer: Medicare HMO

## 2020-10-14 ENCOUNTER — Ambulatory Visit: Payer: Medicare HMO | Admitting: Nurse Practitioner

## 2020-10-24 ENCOUNTER — Other Ambulatory Visit: Payer: Self-pay

## 2020-10-24 ENCOUNTER — Other Ambulatory Visit: Payer: Medicare HMO

## 2020-10-24 DIAGNOSIS — E785 Hyperlipidemia, unspecified: Secondary | ICD-10-CM

## 2020-10-24 DIAGNOSIS — Z9189 Other specified personal risk factors, not elsewhere classified: Secondary | ICD-10-CM | POA: Diagnosis not present

## 2020-10-24 DIAGNOSIS — I10 Essential (primary) hypertension: Secondary | ICD-10-CM | POA: Diagnosis not present

## 2020-10-24 DIAGNOSIS — Z1159 Encounter for screening for other viral diseases: Secondary | ICD-10-CM | POA: Diagnosis not present

## 2020-10-24 LAB — COMPLETE METABOLIC PANEL WITH GFR
AG Ratio: 1.7 (calc) (ref 1.0–2.5)
ALT: 15 U/L (ref 9–46)
AST: 16 U/L (ref 10–35)
Albumin: 4.3 g/dL (ref 3.6–5.1)
Alkaline phosphatase (APISO): 105 U/L (ref 35–144)
BUN: 8 mg/dL (ref 7–25)
CO2: 27 mmol/L (ref 20–32)
Calcium: 9.7 mg/dL (ref 8.6–10.3)
Chloride: 104 mmol/L (ref 98–110)
Creat: 0.86 mg/dL (ref 0.70–1.18)
GFR, Est African American: 101 mL/min/{1.73_m2} (ref >=60)
GFR, Est Non African American: 87 mL/min/{1.73_m2} (ref >=60)
Globulin: 2.5 g/dL (calc) (ref 1.9–3.7)
Glucose, Bld: 94 mg/dL (ref 65–99)
Potassium: 4.5 mmol/L (ref 3.5–5.3)
Sodium: 138 mmol/L (ref 135–146)
Total Bilirubin: 0.5 mg/dL (ref 0.2–1.2)
Total Protein: 6.8 g/dL (ref 6.1–8.1)

## 2020-10-24 LAB — CBC WITH DIFFERENTIAL/PLATELET
Absolute Monocytes: 830 cells/uL (ref 200–950)
Basophils Absolute: 79 cells/uL (ref 0–200)
Basophils Relative: 1 %
Eosinophils Absolute: 87 cells/uL (ref 15–500)
Eosinophils Relative: 1.1 %
HCT: 42.3 % (ref 38.5–50.0)
Hemoglobin: 14.6 g/dL (ref 13.2–17.1)
Lymphs Abs: 3421 cells/uL (ref 850–3900)
MCH: 32.5 pg (ref 27.0–33.0)
MCHC: 34.5 g/dL (ref 32.0–36.0)
MCV: 94.2 fL (ref 80.0–100.0)
MPV: 9.9 fL (ref 7.5–12.5)
Monocytes Relative: 10.5 %
Neutro Abs: 3484 cells/uL (ref 1500–7800)
Neutrophils Relative %: 44.1 %
Platelets: 346 10*3/uL (ref 140–400)
RBC: 4.49 10*6/uL (ref 4.20–5.80)
RDW: 11.8 % (ref 11.0–15.0)
Total Lymphocyte: 43.3 %
WBC: 7.9 10*3/uL (ref 3.8–10.8)

## 2020-10-24 LAB — LIPID PANEL
Cholesterol: 156 mg/dL (ref <200)
HDL: 45 mg/dL (ref >=40)
LDL Cholesterol (Calc): 90 mg/dL (calc)
Non-HDL Cholesterol (Calc): 111 mg/dL (calc) (ref <130)
Total CHOL/HDL Ratio: 3.5 (calc) (ref <5.0)
Triglycerides: 111 mg/dL (ref <150)

## 2020-10-24 LAB — HEPATITIS C ANTIBODY
Hepatitis C Ab: NONREACTIVE
SIGNAL TO CUT-OFF: 0.01 (ref <1.00)

## 2020-10-28 ENCOUNTER — Encounter: Payer: Self-pay | Admitting: Nurse Practitioner

## 2020-10-28 ENCOUNTER — Other Ambulatory Visit: Payer: Self-pay

## 2020-10-28 ENCOUNTER — Ambulatory Visit (INDEPENDENT_AMBULATORY_CARE_PROVIDER_SITE_OTHER): Payer: Medicare HMO | Admitting: Nurse Practitioner

## 2020-10-28 VITALS — BP 130/80 | HR 74 | Temp 97.5°F | Resp 20 | Ht 70.0 in | Wt 203.6 lb

## 2020-10-28 DIAGNOSIS — R350 Frequency of micturition: Secondary | ICD-10-CM | POA: Diagnosis not present

## 2020-10-28 DIAGNOSIS — E782 Mixed hyperlipidemia: Secondary | ICD-10-CM | POA: Diagnosis not present

## 2020-10-28 DIAGNOSIS — G4700 Insomnia, unspecified: Secondary | ICD-10-CM

## 2020-10-28 DIAGNOSIS — Z23 Encounter for immunization: Secondary | ICD-10-CM

## 2020-10-28 DIAGNOSIS — I10 Essential (primary) hypertension: Secondary | ICD-10-CM

## 2020-10-28 DIAGNOSIS — K219 Gastro-esophageal reflux disease without esophagitis: Secondary | ICD-10-CM | POA: Diagnosis not present

## 2020-10-28 DIAGNOSIS — M1A9XX0 Chronic gout, unspecified, without tophus (tophi): Secondary | ICD-10-CM

## 2020-10-28 DIAGNOSIS — N401 Enlarged prostate with lower urinary tract symptoms: Secondary | ICD-10-CM | POA: Diagnosis not present

## 2020-10-28 DIAGNOSIS — J410 Simple chronic bronchitis: Secondary | ICD-10-CM | POA: Diagnosis not present

## 2020-10-28 MED ORDER — ATORVASTATIN CALCIUM 20 MG PO TABS: 20.0000 mg | ORAL_TABLET | Freq: Every day | ORAL | 1 refills | Status: DC

## 2020-10-28 MED ORDER — TAMSULOSIN HCL 0.4 MG PO CAPS: 0.4000 mg | ORAL_CAPSULE | Freq: Every day | ORAL | 1 refills | Status: DC

## 2020-10-28 NOTE — Patient Instructions (Addendum)
Add flomax by mouth daily at bedtime

## 2020-10-28 NOTE — Progress Notes (Signed)
Careteam: Patient Care Team: [name], NP as PCP - General (Geriatric Medicine)  PLACE OF SERVICE:  [place] Directive information Does Patient Have a Medical Advance Directive?: No  No Known Allergies  Chief Complaint  Patient presents with   Medical Management of Chronic Issues    6 Month Follow Up, Discuss Labs     HPI: Patient is a 70 y.o. male for routine follow up   Had right knee done in May, has been doing well since. Minimal pain.   Has lost almost 20 lbs since knee. Would like to lose 5 more lbs and get back into golfing.   GERD- controlled   Gout- stable on uloric   Reports since he has started a new diet a few months ago he is urinating more.   Review of Systems:  Review of Systems  Constitutional: Negative for chills, fever and weight loss.  HENT: Negative for tinnitus.   Respiratory: Negative for cough, sputum production and shortness of breath.   Cardiovascular: Negative for chest pain, palpitations and leg swelling.  Gastrointestinal: Negative for abdominal pain, constipation, diarrhea and heartburn.  Genitourinary: Negative for dysuria, frequency and urgency.  Musculoskeletal: Positive for joint pain. Negative for back pain, falls and myalgias.  Skin: Negative.   Neurological: Negative for dizziness and headaches.  Psychiatric/Behavioral: Negative for depression and memory loss. The patient does not have insomnia.     Past Medical History:  Diagnosis Date   Backache, unspecified    Bronchitis    Cough variant asthma 05/03/2017   Genital herpes    Gout    Hearing loss    Hemorrhoids    Hernia    Herpes simplex    Hyperglycemia    Hyperlipidemia    Hypertension    Impotence    Insomnia, unspecified    Osteoarthrosis, unspecified whether generalized or localized, unspecified site    Tinea pedis, right    Unspecified disorder of male genital organs    Past Surgical History:  Procedure Laterality  Date   fracture left femur  1984   ganglion repair of left forarm  2000   IVP  24   ML old comp fracture   KNEE SURGERY Right 01/21/2019   [place]   TONSILLECTOMY     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   Procedure: TOTAL KNEE ARTHROPLASTY;  Surgeon: Earlie Server, MD;  Location: [facility];  Service: Orthopedics;  Laterality: Right;   TOTAL KNEE ARTHROPLASTY Left 05/06/2020   Procedure: TOTAL KNEE ARTHROPLASTY;  Surgeon: Earlie Server, MD;  Location: [facility];  Service: Orthopedics;  Laterality: Left;   UMBILICAL HERNIA REPAIR  2002   Social History:   reports that he quit smoking about 36 years ago. He has a 15.00 pack-year smoking history. He quit smokeless tobacco use about 31 years ago. He reports current alcohol use of about 6.0 - 7.0 standard drinks of alcohol per week. He reports that he does not use drugs.  Family History  Problem Relation Age of Onset   COPD Mother    Aortic aneurysm Father     Medications: Patient's Medications  New Prescriptions   No medications on file  Previous Medications   ACYCLOVIR (ZOVIRAX) 400 MG TABLET    Take 1 tablet (400 mg total) by mouth daily.   AMLODIPINE (NORVASC) 5 MG TABLET    Take one tablet by mouth once daily to  control blood pressure   FEBUXOSTAT (ULORIC) 40 MG TABLET    Take 1 tablet (40 mg total) by mouth daily.   GUAIFENESIN (MUCINEX) 600 MG 12 HR TABLET    Take 600-1,200 mg by mouth at bedtime as needed for cough or to loosen phlegm.   LOSARTAN (COZAAR) 100 MG TABLET    TAKE ONE TABLET BY MOUTH ONCE DAILY TO CONTROL BLOOD PRESSURE   MULTIPLE VITAMINS-MINERALS (ANTIOXIDANT PO)    Take 30 mLs by mouth daily. Aronia Berry Juice    OMEPRAZOLE (PRILOSEC) 20 MG CAPSULE    Take 20 mg by mouth daily.   TADALAFIL (CIALIS) 20 MG TABLET    Take 1 tablet (20 mg total) by mouth daily as needed for erectile dysfunction.   ZOLPIDEM (AMBIEN) 5 MG TABLET    Take one  tablet by mouth at bedtime as needed for sleep  Modified Medications   Modified Medication Previous Medication   ATORVASTATIN (LIPITOR) 20 MG TABLET atorvastatin (LIPITOR) 20 MG tablet      Take 1 tablet (20 mg total) by mouth at bedtime.    Take 1 tablet (20 mg total) by mouth at bedtime.  Discontinued Medications   No medications on file    Physical Exam:  Vitals:   10/28/20 1336  BP: 130/80  Pulse: 74  Resp: 20  Temp: (!) 97.5 F (36.4 C)  TempSrc: Temporal  SpO2: 96%  Weight: 203 lb 9.6 oz (92.4 kg)  Height: 5\' 10"  (1.778 m)   Body mass index is 29.21 kg/m. Wt Readings from Last 3 Encounters:  10/28/20 203 lb 9.6 oz (92.4 kg)  05/06/20 221 lb 1.9 oz (100.3 kg)  04/25/20 221 lb 3 oz (100.3 kg)    Physical Exam Constitutional:      General: He is not in acute distress.    Appearance: He is well-developed. He is not diaphoretic.  HENT:     Head: Normocephalic and atraumatic.     Mouth/Throat:     Pharynx: No oropharyngeal exudate.  Eyes:     Conjunctiva/sclera: Conjunctivae normal.     Pupils: Pupils are equal, round, and reactive to light.  Cardiovascular:     Rate and Rhythm: Normal rate and regular rhythm.     Heart sounds: Normal heart sounds.  Pulmonary:     Effort: Pulmonary effort is normal.     Breath sounds: Normal breath sounds.  Abdominal:     General: Bowel sounds are normal.     Palpations: Abdomen is soft.  Musculoskeletal:        General: No tenderness.     Cervical back: Normal range of motion and neck supple.  Skin:    General: Skin is warm and dry.  Neurological:     Mental Status: He is alert and oriented to person, place, and time.     Labs reviewed: Basic Metabolic Panel: Recent Labs    04/25/20 1112 05/07/20 0341 10/24/20 0824  NA 139 136 138  K 4.2 3.8 4.5  CL 104 103 104  CO2 26 26 27   GLUCOSE 113* 113* 94  BUN 11 11 8   CREATININE 0.95 0.82 0.86  CALCIUM 9.2 8.6* 9.7   Liver Function Tests: Recent Labs     04/11/20 1028 04/25/20 1112 10/24/20 0824  AST 35 55* 16  ALT 53* 56* 15  ALKPHOS  --  93  --   BILITOT 0.5 1.1 0.5  PROT 6.5 7.3 6.8  ALBUMIN  --  4.4  --  No results for input(s): LIPASE, AMYLASE in the last 8760 hours. No results for input(s): AMMONIA in the last 8760 hours. CBC: Recent Labs    04/11/20 1028 04/11/20 1028 04/25/20 1112 05/07/20 0341 10/24/20 0824  WBC 8.3   < > 7.7 11.9* 7.9  NEUTROABS 3,411  --  3.9  --  3,484  HGB 15.4   < > 15.9 12.9* 14.6  HCT 44.2   < > 46.4 38.7* 42.3  MCV 95.1   < > 94.9 98.7 94.2  PLT 265   < > 258 199 346   < > = values in this interval not displayed.   Lipid Panel: Recent Labs    10/24/20 0824  CHOL 156  HDL 45  LDLCALC 90  TRIG 111  CHOLHDL 3.5   TSH: No results for input(s): TSH in the last 8760 hours. A1C: Lab Results  Component Value Date   HGBA1C 5.5 01/20/2019     Assessment/Plan 1. Need for influenza vaccination - Flu Vaccine QUAD High Dose(Fluad)  2. Mixed hyperlipidemia -LDL improved with dietary modifications, continues with diet and increase in exercise and Lipitor.  - atorvastatin (LIPITOR) 20 MG tablet; Take 1 tablet (20 mg total) by mouth at bedtime.  Dispense: 90 tablet; Refill: 1 - Lipid Panel; Future  3. Chronic gout without tophus, unspecified cause, unspecified site -stable without recent flare, continues on uloric, dietary modifications encouraged - Uric Acid; Future  4. Essential hypertension -stable on current regimen, continue lifestyle modification with losartan and norvasc  - CMP; Future  6. Insomnia, unspecified type -stable, using Ambien PRN.   7. Simple chronic bronchitis (HCC) Stable at this time, without increase in cough or congestion.   8. Benign prostatic hyperplasia with urinary frequency -increase in urinary frequency and nocturia. He is drinking more water to promote healthy lifestyle.  - PSA; Future - tamsulosin (FLOMAX) 0.4 MG CAPS capsule; Take 1 capsule  (0.4 mg total) by mouth at bedtime.  Dispense: 90 capsule; Refill: 1  8. GERD Stable on omeprazole    Next appt: 6 months, labs prior  Kenzlei Runions K. [place], [facility] [phone]

## 2020-11-30 ENCOUNTER — Other Ambulatory Visit: Payer: Self-pay

## 2020-11-30 DIAGNOSIS — G4700 Insomnia, unspecified: Secondary | ICD-10-CM

## 2020-11-30 MED ORDER — ZOLPIDEM TARTRATE 5 MG PO TABS: ORAL_TABLET | ORAL | 0 refills | Status: DC

## 2020-11-30 NOTE — Telephone Encounter (Signed)
Patient called requesting a refill on Ambien. Medication pended and sent to Sherrie Mustache, NP for approval/denial.

## 2020-12-30 ENCOUNTER — Other Ambulatory Visit: Payer: Self-pay | Admitting: Nurse Practitioner

## 2020-12-30 DIAGNOSIS — I10 Essential (primary) hypertension: Secondary | ICD-10-CM

## 2021-01-02 DIAGNOSIS — M25562 Pain in left knee: Secondary | ICD-10-CM | POA: Diagnosis not present

## 2021-01-02 DIAGNOSIS — L820 Inflamed seborrheic keratosis: Secondary | ICD-10-CM | POA: Diagnosis not present

## 2021-01-02 DIAGNOSIS — L918 Other hypertrophic disorders of the skin: Secondary | ICD-10-CM | POA: Diagnosis not present

## 2021-01-02 DIAGNOSIS — D1801 Hemangioma of skin and subcutaneous tissue: Secondary | ICD-10-CM | POA: Diagnosis not present

## 2021-01-02 DIAGNOSIS — L821 Other seborrheic keratosis: Secondary | ICD-10-CM | POA: Diagnosis not present

## 2021-01-02 DIAGNOSIS — M1712 Unilateral primary osteoarthritis, left knee: Secondary | ICD-10-CM | POA: Diagnosis not present

## 2021-01-09 DIAGNOSIS — G4730 Sleep apnea, unspecified: Secondary | ICD-10-CM | POA: Diagnosis not present

## 2021-01-09 DIAGNOSIS — G4733 Obstructive sleep apnea (adult) (pediatric): Secondary | ICD-10-CM | POA: Diagnosis not present

## 2021-01-10 ENCOUNTER — Other Ambulatory Visit: Payer: Self-pay

## 2021-01-10 DIAGNOSIS — G4700 Insomnia, unspecified: Secondary | ICD-10-CM

## 2021-01-10 MED ORDER — ZOLPIDEM TARTRATE 5 MG PO TABS: ORAL_TABLET | ORAL | 0 refills | Status: DC

## 2021-01-30 DIAGNOSIS — M1712 Unilateral primary osteoarthritis, left knee: Secondary | ICD-10-CM | POA: Diagnosis not present

## 2021-02-20 ENCOUNTER — Other Ambulatory Visit: Payer: Self-pay | Admitting: Nurse Practitioner

## 2021-04-05 ENCOUNTER — Telehealth: Payer: Self-pay

## 2021-04-05 DIAGNOSIS — N529 Male erectile dysfunction, unspecified: Secondary | ICD-10-CM

## 2021-04-05 NOTE — Telephone Encounter (Signed)
Patient called and asked about his refill request that he says the pharmacy in [place] where he is vacationing had sent over I went to  [employer] and she checked the faxes and we found nothing had been faxed for him I called him back and told him we didn't receive any refill request by fax I gave him the fax number for our office and he said he would give it to the pharmacy to fax order again

## 2021-04-05 NOTE — Telephone Encounter (Signed)
This encounter was created in error - please disregard.

## 2021-04-06 ENCOUNTER — Other Ambulatory Visit: Payer: Self-pay | Admitting: *Deleted

## 2021-04-06 DIAGNOSIS — N529 Male erectile dysfunction, unspecified: Secondary | ICD-10-CM

## 2021-04-06 MED ORDER — TADALAFIL 20 MG PO TABS: 20.0000 mg | ORAL_TABLET | Freq: Every day | ORAL | 0 refills | Status: DC | PRN

## 2021-04-06 NOTE — Telephone Encounter (Signed)
Received refill Request from [facility]. Patient is vacationing and needs refill

## 2021-04-10 ENCOUNTER — Other Ambulatory Visit: Payer: Self-pay

## 2021-04-10 ENCOUNTER — Other Ambulatory Visit: Payer: Medicare HMO

## 2021-04-10 DIAGNOSIS — G4730 Sleep apnea, unspecified: Secondary | ICD-10-CM | POA: Diagnosis not present

## 2021-04-10 DIAGNOSIS — N401 Enlarged prostate with lower urinary tract symptoms: Secondary | ICD-10-CM

## 2021-04-10 DIAGNOSIS — E782 Mixed hyperlipidemia: Secondary | ICD-10-CM

## 2021-04-10 DIAGNOSIS — R350 Frequency of micturition: Secondary | ICD-10-CM | POA: Diagnosis not present

## 2021-04-10 DIAGNOSIS — M1A9XX0 Chronic gout, unspecified, without tophus (tophi): Secondary | ICD-10-CM

## 2021-04-10 DIAGNOSIS — I10 Essential (primary) hypertension: Secondary | ICD-10-CM

## 2021-04-10 DIAGNOSIS — G4733 Obstructive sleep apnea (adult) (pediatric): Secondary | ICD-10-CM | POA: Diagnosis not present

## 2021-04-11 LAB — COMPREHENSIVE METABOLIC PANEL
AG Ratio: 2 (calc) (ref 1.0–2.5)
ALT: 21 U/L (ref 9–46)
AST: 19 U/L (ref 10–35)
Albumin: 4.7 g/dL (ref 3.6–5.1)
Alkaline phosphatase (APISO): 95 U/L (ref 35–144)
BUN: 13 mg/dL (ref 7–25)
CO2: 28 mmol/L (ref 20–32)
Calcium: 9.5 mg/dL (ref 8.6–10.3)
Chloride: 103 mmol/L (ref 98–110)
Creat: 0.81 mg/dL (ref 0.70–1.18)
Globulin: 2.4 g/dL (calc) (ref 1.9–3.7)
Glucose, Bld: 95 mg/dL (ref 65–99)
Potassium: 4.5 mmol/L (ref 3.5–5.3)
Sodium: 140 mmol/L (ref 135–146)
Total Bilirubin: 0.4 mg/dL (ref 0.2–1.2)
Total Protein: 7.1 g/dL (ref 6.1–8.1)

## 2021-04-11 LAB — LIPID PANEL
Cholesterol: 222 mg/dL — ABNORMAL HIGH (ref <200)
HDL: 65 mg/dL (ref >=40)
LDL Cholesterol (Calc): 120 mg/dL (calc) — ABNORMAL HIGH
Non-HDL Cholesterol (Calc): 157 mg/dL (calc) — ABNORMAL HIGH (ref <130)
Total CHOL/HDL Ratio: 3.4 (calc) (ref <5.0)
Triglycerides: 244 mg/dL — ABNORMAL HIGH (ref <150)

## 2021-04-11 LAB — PSA: PSA: 1.38 ng/mL (ref <=4.0)

## 2021-04-11 LAB — URIC ACID: Uric Acid, Serum: 5 mg/dL (ref 4.0–8.0)

## 2021-04-14 ENCOUNTER — Ambulatory Visit: Payer: Medicare HMO | Admitting: Nurse Practitioner

## 2021-04-17 ENCOUNTER — Other Ambulatory Visit: Payer: Self-pay

## 2021-04-17 ENCOUNTER — Ambulatory Visit (INDEPENDENT_AMBULATORY_CARE_PROVIDER_SITE_OTHER): Payer: Medicare HMO | Admitting: Nurse Practitioner

## 2021-04-17 ENCOUNTER — Encounter: Payer: Self-pay | Admitting: Nurse Practitioner

## 2021-04-17 VITALS — BP 134/78 | HR 82 | Temp 96.9°F | Ht 70.0 in | Wt 220.0 lb

## 2021-04-17 DIAGNOSIS — R350 Frequency of micturition: Secondary | ICD-10-CM

## 2021-04-17 DIAGNOSIS — J410 Simple chronic bronchitis: Secondary | ICD-10-CM | POA: Diagnosis not present

## 2021-04-17 DIAGNOSIS — N401 Enlarged prostate with lower urinary tract symptoms: Secondary | ICD-10-CM

## 2021-04-17 DIAGNOSIS — E782 Mixed hyperlipidemia: Secondary | ICD-10-CM

## 2021-04-17 DIAGNOSIS — M1A9XX0 Chronic gout, unspecified, without tophus (tophi): Secondary | ICD-10-CM

## 2021-04-17 DIAGNOSIS — G4700 Insomnia, unspecified: Secondary | ICD-10-CM | POA: Diagnosis not present

## 2021-04-17 DIAGNOSIS — K219 Gastro-esophageal reflux disease without esophagitis: Secondary | ICD-10-CM

## 2021-04-17 DIAGNOSIS — I10 Essential (primary) hypertension: Secondary | ICD-10-CM

## 2021-04-17 NOTE — Progress Notes (Signed)
Careteam: Patient Care Team: [name], NP as PCP - General (Geriatric Medicine)  PLACE OF SERVICE:  [place] Directive information Does Patient Have a Medical Advance Directive?: Yes, Type of Advance Directive: Living will, Does patient want to make changes to medical advance directive?: No - Patient declined  No Known Allergies  Chief Complaint  Patient presents with  . Acute Visit    6 month follow-up and update treatment agreement for Ambien     HPI: Patient is a 71 y.o. male for routine follow up.   Insomnia- generally sleeps well but when traveling has a hard time, using ambien 5 mg PRN  Hyperlipidemia- taking lipitor 20 mg daily. Eating a lot of sweets. Triglycerides elevated.   Gout- continues on uloric 40 mg daily, no recent gout flares.   htn- controlled on losartan 100 mg daily  OA- continues to have problems    Review of Systems:  Review of Systems  Constitutional: Negative for chills, fever and weight loss.  HENT: Positive for congestion. Negative for tinnitus.   Respiratory: Negative for cough, sputum production, shortness of breath and wheezing.   Cardiovascular: Negative for chest pain, palpitations and leg swelling.  Gastrointestinal: Negative for abdominal pain, constipation, diarrhea and heartburn.  Genitourinary: Negative for dysuria, frequency and urgency.  Musculoskeletal: Positive for joint pain. Negative for back pain, falls and myalgias.  Skin: Negative.   Neurological: Negative for dizziness and headaches.  Endo/Heme/Allergies: Positive for environmental allergies.  Psychiatric/Behavioral: Negative for depression and memory loss. The patient does not have insomnia.     Past Medical History:  Diagnosis Date  . Backache, unspecified   . Bronchitis   . Cough variant asthma 05/03/2017  . Genital herpes   . Gout   . Hearing loss   . Hemorrhoids   . Hernia   . Herpes simplex   . Hyperglycemia   . Hyperlipidemia   .  Hypertension   . Impotence   . Insomnia, unspecified   . Osteoarthrosis, unspecified whether generalized or localized, unspecified site   . Tinea pedis, right   . Unspecified disorder of male genital organs    Past Surgical History:  Procedure Laterality Date  . fracture left femur  1984  . ganglion repair of left forarm  2000  . IVP  59   ML old comp fracture  . KNEE SURGERY Right 01/21/2019   [place] [facility]   . NASAL SEPTUM SURGERY  1997  . TONSILLECTOMY  1959  . TONSILLECTOMY    . 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   Procedure: TOTAL KNEE ARTHROPLASTY;  Surgeon: Earlie Server, MD;  Location: [facility];  Service: Orthopedics;  Laterality: Right;  . TOTAL KNEE ARTHROPLASTY Left 05/06/2020   Procedure: TOTAL KNEE ARTHROPLASTY;  Surgeon: Earlie Server, MD;  Location: [facility];  Service: Orthopedics;  Laterality: Left;  . UMBILICAL HERNIA REPAIR  2002   Social History:   reports that he quit smoking about 37 years ago. He has a 15.00 pack-year smoking history. He quit smokeless tobacco use about 32 years ago. He reports current alcohol use of about 6.0 - 7.0 standard drinks of alcohol per week. He reports that he does not use drugs.  Family History  Problem Relation Age of Onset  . COPD Mother   . Aortic aneurysm Father     Medications: Patient's Medications  New Prescriptions   No medications on file  Previous Medications   ACYCLOVIR (ZOVIRAX) 400 MG TABLET  TAKE 1 TABLET BY MOUTH EVERY DAY   AMLODIPINE (NORVASC) 5 MG TABLET    Take one tablet by mouth once daily to control blood pressure   ATORVASTATIN (LIPITOR) 20 MG TABLET    Take 1 tablet (20 mg total) by mouth at bedtime.   FEBUXOSTAT (ULORIC) 40 MG TABLET    Take 1 tablet (40 mg total) by mouth daily.   GUAIFENESIN (MUCINEX) 600 MG 12 HR TABLET    Take 600-1,200 mg by mouth at bedtime as needed for cough or to loosen phlegm.   LOSARTAN (COZAAR) 100 MG TABLET    TAKE ONE TABLET BY MOUTH ONCE DAILY TO CONTROL BLOOD  PRESSURE   MULTIPLE VITAMINS-MINERALS (ANTIOXIDANT PO)    Take 30 mLs by mouth daily. Aronia Berry Juice   OMEPRAZOLE (PRILOSEC) 20 MG CAPSULE    Take 20 mg by mouth daily.   TADALAFIL (CIALIS) 20 MG TABLET    Take 1 tablet (20 mg total) by mouth daily as needed for erectile dysfunction.   ZOLPIDEM (AMBIEN) 5 MG TABLET    Take one tablet by mouth at bedtime as needed for sleep  Modified Medications   No medications on file  Discontinued Medications   TAMSULOSIN (FLOMAX) 0.4 MG CAPS CAPSULE    Take 1 capsule (0.4 mg total) by mouth at bedtime.    Physical Exam:  Vitals:   04/17/21 1003  BP: 134/78  Pulse: 82  Temp: (!) 96.9 F (36.1 C)  TempSrc: Temporal  SpO2: 96%  Weight: 220 lb (99.8 kg)  Height: 5\' 10"  (1.778 m)   Body mass index is 31.57 kg/m. Wt Readings from Last 3 Encounters:  04/17/21 220 lb (99.8 kg)  10/28/20 203 lb 9.6 oz (92.4 kg)  05/06/20 221 lb 1.9 oz (100.3 kg)    Physical Exam Constitutional:      General: He is not in acute distress.    Appearance: He is well-developed. He is not diaphoretic.  HENT:     Head: Normocephalic and atraumatic.     Mouth/Throat:     Pharynx: No oropharyngeal exudate.  Eyes:     Conjunctiva/sclera: Conjunctivae normal.     Pupils: Pupils are equal, round, and reactive to light.  Cardiovascular:     Rate and Rhythm: Normal rate and regular rhythm.     Heart sounds: Normal heart sounds.  Pulmonary:     Effort: Pulmonary effort is normal.     Breath sounds: Normal breath sounds.  Abdominal:     General: Bowel sounds are normal.     Palpations: Abdomen is soft.  Musculoskeletal:        General: No tenderness.     Cervical back: Normal range of motion and neck supple.  Skin:    General: Skin is warm and dry.  Neurological:     Mental Status: He is alert and oriented to person, place, and time.  Psychiatric:        Mood and Affect: Mood normal.        Behavior: Behavior normal.     Labs reviewed: Basic Metabolic  Panel: Recent Labs    05/07/20 0341 10/24/20 0824 04/10/21 0842  NA 136 138 140  K 3.8 4.5 4.5  CL 103 104 103  CO2 26 27 28   GLUCOSE 113* 94 95  BUN 11 8 13   CREATININE 0.82 0.86 0.81  CALCIUM 8.6* 9.7 9.5   Liver Function Tests: Recent Labs    04/25/20 1112 10/24/20 0824 04/10/21 0842  AST 55*  16 19  ALT 56* 15 21  ALKPHOS 93  --   --   BILITOT 1.1 0.5 0.4  PROT 7.3 6.8 7.1  ALBUMIN 4.4  --   --    No results for input(s): LIPASE, AMYLASE in the last 8760 hours. No results for input(s): AMMONIA in the last 8760 hours. CBC: Recent Labs    04/25/20 1112 05/07/20 0341 10/24/20 0824  WBC 7.7 11.9* 7.9  NEUTROABS 3.9  --  3,484  HGB 15.9 12.9* 14.6  HCT 46.4 38.7* 42.3  MCV 94.9 98.7 94.2  PLT 258 199 346   Lipid Panel: Recent Labs    10/24/20 0824 04/10/21 0842  CHOL 156 222*  HDL 45 65  LDLCALC 90 120*  TRIG 111 244*  CHOLHDL 3.5 3.4   TSH: No results for input(s): TSH in the last 8760 hours. A1C: Lab Results  Component Value Date   HGBA1C 5.5 01/20/2019     Assessment/Plan 1. Simple chronic bronchitis (HCC) Stable at this time. No recent flares.   2. Mixed hyperlipidemia He has been eating poorly with girlfriend. Reports there are areas of improvement and can implement these.  - Lipid Panel; Future  3. Chronic gout without tophus, unspecified cause, unspecified site Stable, no recent flares, uric acid at goal on uloric and dietary modifications   4. Essential hypertension Blood pressure at goal on norvasc and losartan. To continue medication and dietary   5. Insomnia, unspecified type Stable, uses Ambien occasionally   6. Benign prostatic hyperplasia with urinary frequency Stable at this time.   7. Gastroesophageal reflux disease without esophagitis -controlled with dietary modifications and omeprazole.   Next appt: 3 months with fasting lab prior to appt  Tanyia Grabbe K. [place], [facility] [phone]

## 2021-04-17 NOTE — Patient Instructions (Signed)
Cut out soda/soft drinks. Cut out candies and limit sweets and desserts   Limit dairy- cheese, ice cream   Follow up in 3 months, fasting labs prior to visit.    Heart-Healthy Eating Plan  Many factors influence your heart (coronary) health, including eating and exercise habits. Coronary risk increases with abnormal blood fat (lipid) levels. Heart-healthy meal planning includes limiting unhealthy fats, increasing healthy fats, and making other diet and lifestyle changes. What are tips for following this plan? Cooking Cook foods using methods other than frying. Baking, boiling, grilling, and broiling are all good options. Other ways to reduce fat include:  Removing the skin from poultry.  Removing all visible fats from meats.  Steaming vegetables in water or broth. Meal planning  At meals, imagine dividing your plate into fourths: ? Fill one-half of your plate with vegetables and green salads. ? Fill one-fourth of your plate with whole grains. ? Fill one-fourth of your plate with lean protein foods.  Eat 4-5 servings of vegetables per day. One serving equals 1 cup raw or cooked vegetable, or 2 cups raw leafy greens.  Eat 4-5 servings of fruit per day. One serving equals 1 medium whole fruit,  cup dried fruit,  cup fresh, frozen, or canned fruit, or  cup 100% fruit juice.  Eat more foods that contain soluble fiber. Examples include apples, broccoli, carrots, beans, peas, and barley. Aim to get 25-30 g of fiber per day.  Increase your consumption of legumes, nuts, and seeds to 4-5 servings per week. One serving of dried beans or legumes equals  cup cooked, 1 serving of nuts is  cup, and 1 serving of seeds equals 1 tablespoon.   Fats  Choose healthy fats more often. Choose monounsaturated and polyunsaturated fats, such as olive and canola oils, flaxseeds, walnuts, almonds, and seeds.  Eat more omega-3 fats. Choose salmon, mackerel, sardines, tuna, flaxseed oil, and ground  flaxseeds. Aim to eat fish at least 2 times each week.  Check food labels carefully to identify foods with trans fats or high amounts of saturated fat.  Limit saturated fats. These are found in animal products, such as meats, butter, and cream. Plant sources of saturated fats include palm oil, palm kernel oil, and coconut oil.  Avoid foods with partially hydrogenated oils in them. These contain trans fats. Examples are stick margarine, some tub margarines, cookies, crackers, and other baked goods.  Avoid fried foods. General information  Eat more home-cooked food and less restaurant, buffet, and fast food.  Limit or avoid alcohol.  Limit foods that are high in starch and sugar.  Lose weight if you are overweight. Losing just 5-10% of your body weight can help your overall health and prevent diseases such as diabetes and heart disease.  Monitor your salt (sodium) intake, especially if you have high blood pressure. Talk with your health care provider about your sodium intake.  Try to incorporate more vegetarian meals weekly. What foods can I eat? Fruits All fresh, canned (in natural juice), or frozen fruits. Vegetables Fresh or frozen vegetables (raw, steamed, roasted, or grilled). Green salads. Grains Most grains. Choose whole wheat and whole grains most of the time. Rice and pasta, including brown rice and pastas made with whole wheat. Meats and other proteins Lean, well-trimmed beef, veal, pork, and lamb. Chicken and [place] without skin. All fish and shellfish. Wild duck, rabbit, pheasant, and venison. Egg whites or low-cholesterol egg substitutes. Dried beans, peas, lentils, and tofu. Seeds and most nuts. Dairy Low-fat  or nonfat cheeses, including ricotta and mozzarella. Skim or 1% milk (liquid, powdered, or evaporated). Buttermilk made with low-fat milk. Nonfat or low-fat yogurt. Fats and oils Non-hydrogenated (trans-free) margarines. Vegetable oils, including soybean, sesame,  sunflower, olive, peanut, safflower, corn, canola, and cottonseed. Salad dressings or mayonnaise made with a vegetable oil. Beverages Water (mineral or sparkling). Coffee and tea. Diet carbonated beverages. Sweets and desserts Sherbet, gelatin, and fruit ice. Small amounts of dark chocolate. Limit all sweets and desserts. Seasonings and condiments All seasonings and condiments. The items listed above may not be a complete list of foods and beverages you can eat. Contact a dietitian for more options. What foods are not recommended? Fruits Canned fruit in heavy syrup. Fruit in cream or butter sauce. Fried fruit. Limit coconut. Vegetables Vegetables cooked in cheese, cream, or butter sauce. Fried vegetables. Grains Breads made with saturated or trans fats, oils, or whole milk. Croissants. Sweet rolls. Donuts. High-fat crackers, such as cheese crackers. Meats and other proteins Fatty meats, such as hot dogs, ribs, sausage, bacon, rib-eye roast or steak. High-fat deli meats, such as salami and bologna. Caviar. Domestic duck and goose. Organ meats, such as liver. Dairy Cream, sour cream, cream cheese, and creamed cottage cheese. Whole milk cheeses. Whole or 2% milk (liquid, evaporated, or condensed). Whole buttermilk. Cream sauce or high-fat cheese sauce. Whole-milk yogurt. Fats and oils Meat fat, or shortening. Cocoa butter, hydrogenated oils, palm oil, coconut oil, palm kernel oil. Solid fats and shortenings, including bacon fat, salt pork, lard, and butter. Nondairy cream substitutes. Salad dressings with cheese or sour cream. Beverages Regular sodas and any drinks with added sugar. Sweets and desserts Frosting. Pudding. Cookies. Cakes. Pies. Milk chocolate or white chocolate. Buttered syrups. Full-fat ice cream or ice cream drinks. The items listed above may not be a complete list of foods and beverages to avoid. Contact a dietitian for more information. Summary  Heart-healthy meal  planning includes limiting unhealthy fats, increasing healthy fats, and making other diet and lifestyle changes.  Lose weight if you are overweight. Losing just 5-10% of your body weight can help your overall health and prevent diseases such as diabetes and heart disease.  Focus on eating a balance of foods, including fruits and vegetables, low-fat or nonfat dairy, lean protein, nuts and legumes, whole grains, and heart-healthy oils and fats. This information is not intended to replace advice given to you by your health care provider. Make sure you discuss any questions you have with your health care provider. Document Revised: 01/24/2018 Document Reviewed: 01/24/2018 Elsevier Patient Education  2021 [employer].

## 2021-07-05 ENCOUNTER — Other Ambulatory Visit: Payer: Self-pay | Admitting: Nurse Practitioner

## 2021-07-05 DIAGNOSIS — I10 Essential (primary) hypertension: Secondary | ICD-10-CM

## 2021-08-08 DIAGNOSIS — H608X2 Other otitis externa, left ear: Secondary | ICD-10-CM | POA: Diagnosis not present

## 2021-08-08 DIAGNOSIS — H6121 Impacted cerumen, right ear: Secondary | ICD-10-CM | POA: Diagnosis not present

## 2021-08-08 DIAGNOSIS — Z9989 Dependence on other enabling machines and devices: Secondary | ICD-10-CM | POA: Diagnosis not present

## 2021-08-25 ENCOUNTER — Other Ambulatory Visit: Payer: Self-pay | Admitting: Nurse Practitioner

## 2021-10-02 ENCOUNTER — Other Ambulatory Visit: Payer: Self-pay | Admitting: *Deleted

## 2021-10-02 NOTE — Telephone Encounter (Signed)
Received refill request from [facility] for refill on Patient's Uloric 40mg    Printed Rx and placed in [place] folder to review and sign.   Patient needs appointment before anymore future refills written on script.

## 2021-10-03 ENCOUNTER — Other Ambulatory Visit: Payer: Self-pay | Admitting: Nurse Practitioner

## 2021-10-03 DIAGNOSIS — E782 Mixed hyperlipidemia: Secondary | ICD-10-CM

## 2021-10-09 ENCOUNTER — Ambulatory Visit (INDEPENDENT_AMBULATORY_CARE_PROVIDER_SITE_OTHER): Payer: Medicare HMO | Admitting: Nurse Practitioner

## 2021-10-09 ENCOUNTER — Other Ambulatory Visit: Payer: Self-pay

## 2021-10-09 ENCOUNTER — Encounter: Payer: Self-pay | Admitting: Nurse Practitioner

## 2021-10-09 VITALS — BP 150/90 | HR 78 | Temp 97.8°F | Ht 70.0 in | Wt 214.4 lb

## 2021-10-09 DIAGNOSIS — G4700 Insomnia, unspecified: Secondary | ICD-10-CM | POA: Diagnosis not present

## 2021-10-09 DIAGNOSIS — K219 Gastro-esophageal reflux disease without esophagitis: Secondary | ICD-10-CM | POA: Diagnosis not present

## 2021-10-09 DIAGNOSIS — Z23 Encounter for immunization: Secondary | ICD-10-CM | POA: Diagnosis not present

## 2021-10-09 DIAGNOSIS — E782 Mixed hyperlipidemia: Secondary | ICD-10-CM | POA: Diagnosis not present

## 2021-10-09 DIAGNOSIS — M1A9XX0 Chronic gout, unspecified, without tophus (tophi): Secondary | ICD-10-CM

## 2021-10-09 DIAGNOSIS — I10 Essential (primary) hypertension: Secondary | ICD-10-CM

## 2021-10-09 NOTE — Patient Instructions (Signed)
Blood pressure should be less than 140/90  Take blood pressure at least 1 hour after medication   AWV in 6 weeks via telephone- you can report blood pressure at that time.

## 2021-10-09 NOTE — Progress Notes (Signed)
Careteam: Patient Care Team: [name], NP as PCP - General (Geriatric Medicine)  PLACE OF SERVICE:  [place] Directive information    No Known Allergies  Chief Complaint  Patient presents with   Medical Management of Chronic Issues    6 month follow-up. Discuss need for covid #4 and flu vaccine or postpone/exclude if patient refuses.      HPI: Patient is a 71 y.o. male for routine follow up.   HTN- BP elevated 150/90. Continues amlodpine 5 mg and losartan 100 mg daily. Has started eating out more at restaurants. Reports that he is not monitoring his diet when eating out. Eating more sodium.   Hyperlipidemia - Continues Atorvastatin 20 mg. Has cut back on sweets cakes etc, sometimes eats a snickers.   Exercises three times a week, includes walking and also plays golf   In[place]mia- takes [place] as needed when having difficulty sleeping. Wakes up x1 to urinate   Gout- No recent flare up. Continues Uloric   GERD- no concerns. Continues omeprazole.   OA- stable, doing well after bilateral knee replacement. No new pain   Review of Systems:  Review of Systems  Constitutional:  Positive for diaphoresis. Negative for chills and fever.  HENT:  Negative for ear discharge.   Eyes:  Negative for pain and discharge.  Respiratory:  Negative for cough and shortness of breath.   Cardiovascular:  Negative for chest pain, palpitations and leg swelling.  Gastrointestinal:  Negative for blood in stool, constipation, diarrhea and heartburn.  Genitourinary:  Negative for frequency.  Musculoskeletal: Negative.   Neurological:  Negative for weakness and headaches.  Psychiatric/Behavioral:  The patient has insomnia.    Past Medical History:  Diagnosis Date   Backache, unspecified    Bronchitis    Cough variant asthma 05/03/2017   Genital herpes    Gout    Hearing loss    Hemorrhoids    Hernia    Herpes simplex    Hyperglycemia    Hyperlipidemia     Hypertension    Impotence    Insomnia, unspecified    Osteoarthrosis, unspecified whether generalized or localized, unspecified site    Tinea pedis, right    Unspecified disorder of male genital organs    Past Surgical History:  Procedure Laterality Date   fracture left femur  1984   ganglion repair of left forarm  2000   IVP  10   ML old comp fracture   KNEE SURGERY Right 01/21/2019   [place]   TONSILLECTOMY     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 His smoking use included cigarettes. He has a 15.00 pack-year smoking history. He quit smokeless tobacco use about 32 years ago. He reports current alcohol use of about 6.0 - 7.0 standard drinks per week. He reports that he does not use drugs.  Family History  Problem Relation Age of Onset   COPD Mother    Aortic aneurysm Father     Medications: Patient's Medications  New Prescriptions   No medications on file  Previous Medications   ACYCLOVIR (ZOVIRAX) 400 MG TABLET    TAKE 1 TABLET BY MOUTH EVERY DAY   AMLODIPINE (NORVASC) 5 MG TABLET    TAKE ONE TABLET BY MOUTH ONCE DAILY TO CONTROL BLOOD PRESSURE   ATORVASTATIN (LIPITOR) 20 MG TABLET    TAKE 1 TABLET BY MOUTH EVERYDAY AT BEDTIME   FEBUXOSTAT (ULORIC) 40 MG TABLET    Take 1 tablet (40 mg total) by mouth daily.   GUAIFENESIN (MUCINEX) 600 MG 12 HR TABLET    Take 600-1,200 mg by mouth at bedtime as needed for cough or to loosen phlegm.   LOSARTAN (COZAAR) 100 MG TABLET    Take one by mouth daily for blood pressure control  I10   MULTIPLE VITAMINS-MINERALS (ANTIOXIDANT PO)    Take 30 mLs by mouth daily. Aronia Berry Juice   OMEPRAZOLE (PRILOSEC) 20 MG CAPSULE    Take 20 mg by mouth daily.   TADALAFIL (CIALIS) 20 MG TABLET    Take 1 tablet (20 mg total) by mouth daily as needed for erectile dysfunction.   ZOLPIDEM (AMBIEN) 5 MG TABLET    Take one tablet by mouth at bedtime as needed for sleep  Modified Medications   No medications on file  Discontinued Medications   No medications on file    Physical Exam:  Vitals:   10/09/21 1259  BP: (!) 150/90  Pulse: 78  Temp: 97.8 F (36.6 C)  SpO2: 98%  Weight: 214 lb 6.4 oz (97.3 kg)  Height: '5\' 10"'  (1.778 m)   Body mass index is 30.76 kg/m. Wt Readings from Last 3 Encounters:  10/09/21 214 lb 6.4 oz (97.3 kg)  04/17/21 220 lb (99.8 kg)  10/28/20 203 lb 9.6 oz (92.4 kg)    Physical Exam Constitutional:      Appearance: Normal appearance. He is not ill-appearing.  HENT:     Head: Normocephalic and atraumatic.     Right Ear: Tympanic membrane normal.     Left Ear: Tympanic membrane normal.     Nose: No congestion.  Eyes:     Pupils: Pupils are equal, round, and reactive to light.  Cardiovascular:     Rate and Rhythm: Normal rate and regular rhythm.  Pulmonary:     Effort: Pulmonary effort is normal.     Breath sounds: Normal breath sounds.  Abdominal:     General: Bowel sounds are normal.     Palpations: Abdomen is soft.  Musculoskeletal:     Cervical back: Normal range of motion and neck supple.  Skin:    General: Skin is warm and dry.  Neurological:     Mental Status: He is alert and oriented to person, place, and time.  Psychiatric:        Mood and Affect: Mood normal.        Behavior: Behavior normal.        Thought Content: Thought content normal.    Labs reviewed: Basic Metabolic Panel: Recent Labs    10/24/20 0824 04/10/21 0842  NA 138 140  K 4.5 4.5  CL 104 103  CO2 27 28  GLUCOSE 94 95  BUN 8 13  CREATININE 0.86 0.81   CALCIUM 9.7 9.5   Liver Function Tests: Recent Labs    10/24/20 0824 04/10/21 0842  AST 16 19  ALT 15 21  BILITOT 0.5 0.4  PROT 6.8 7.1   No results for input(s): LIPASE, AMYLASE in the last  8760 hours. No results for input(s): AMMONIA in the last 8760 hours. CBC: Recent Labs    10/24/20 0824  WBC 7.9  NEUTROABS 3,484  HGB 14.6  HCT 42.3  MCV 94.2  PLT 346   Lipid Panel: Recent Labs    10/24/20 0824 04/10/21 0842  CHOL 156 222*  HDL 45 65  LDLCALC 90 120*  TRIG 111 244*  CHOLHDL 3.5 3.4   TSH: No results for input(s): TSH in the last 8760 hours. A1C: Lab Results  Component Value Date   HGBA1C 5.5 01/20/2019     Assessment/Plan 1. Need for influenza vaccination - Flu Vaccine QUAD High Dose(Fluad)   2. Mixed hyperlipidemia Continue atorvastatin. Has been eating out more. Willing to make necessary diet modifications. Heart healthy diet encouraged.  - CMP with eGFR([facility]) - Lipid Panel  3. Essential hypertension Blood pressure elevated 150/90. Patient has been eating out more and not monitoring diet or sodium intake. Is willing to make dietary changes. Check and record home BP readings 3x's a week. Will discuss at follow up visit.  - CMP with eGFR([facility]) - CBC with Differential/Platelet   4. Chronic gout without tophus, unspecified cause, unspecified site Stable continues Uloric 40 mg. No recent flares. Uric acid level at goal on last labs.   5. Insomnia, unspecified type Stable, Uses Ambien when needed.   6. Gastroesophageal reflux disease without esophagitis Controlled with prilosec 20 mg, continue dietary modifications.    Next appt: 6 weeks for AWV, 6 months for routine follow up.   Carlos American. Harle Battiest  I personally was present during the history, physical exam and medical decision-making activities of this service and have verified that the service and findings are accurately documented in the student's note   [place]  [facility] [phone]

## 2021-10-10 LAB — CBC WITH DIFFERENTIAL/PLATELET
Absolute Monocytes: 1132 cells/uL — ABNORMAL HIGH (ref 200–950)
Basophils Absolute: 74 cells/uL (ref 0–200)
Basophils Relative: 0.8 %
Eosinophils Absolute: 74 cells/uL (ref 15–500)
Eosinophils Relative: 0.8 %
HCT: 46 % (ref 38.5–50.0)
Hemoglobin: 16 g/dL (ref 13.2–17.1)
Lymphs Abs: 2410 cells/uL (ref 850–3900)
MCH: 32.9 pg (ref 27.0–33.0)
MCHC: 34.8 g/dL (ref 32.0–36.0)
MCV: 94.7 fL (ref 80.0–100.0)
MPV: 10.4 fL (ref 7.5–12.5)
Monocytes Relative: 12.3 %
Neutro Abs: 5511 cells/uL (ref 1500–7800)
Neutrophils Relative %: 59.9 %
Platelets: 273 10*3/uL (ref 140–400)
RBC: 4.86 10*6/uL (ref 4.20–5.80)
RDW: 12.1 % (ref 11.0–15.0)
Total Lymphocyte: 26.2 %
WBC: 9.2 10*3/uL (ref 3.8–10.8)

## 2021-10-10 LAB — COMPLETE METABOLIC PANEL WITH GFR
AG Ratio: 1.7 (calc) (ref 1.0–2.5)
ALT: 14 U/L (ref 9–46)
AST: 18 U/L (ref 10–35)
Albumin: 4.5 g/dL (ref 3.6–5.1)
Alkaline phosphatase (APISO): 100 U/L (ref 35–144)
BUN: 12 mg/dL (ref 7–25)
CO2: 26 mmol/L (ref 20–32)
Calcium: 9.6 mg/dL (ref 8.6–10.3)
Chloride: 99 mmol/L (ref 98–110)
Creat: 0.79 mg/dL (ref 0.70–1.28)
Globulin: 2.6 g/dL (calc) (ref 1.9–3.7)
Glucose, Bld: 133 mg/dL (ref 65–139)
Potassium: 4.1 mmol/L (ref 3.5–5.3)
Sodium: 134 mmol/L — ABNORMAL LOW (ref 135–146)
Total Bilirubin: 0.6 mg/dL (ref 0.2–1.2)
Total Protein: 7.1 g/dL (ref 6.1–8.1)
eGFR: 94 mL/min/{1.73_m2} (ref >=60)

## 2021-10-10 LAB — LIPID PANEL
Cholesterol: 194 mg/dL (ref <200)
HDL: 72 mg/dL (ref >=40)
LDL Cholesterol (Calc): 102 mg/dL (calc) — ABNORMAL HIGH
Non-HDL Cholesterol (Calc): 122 mg/dL (calc) (ref <130)
Total CHOL/HDL Ratio: 2.7 (calc) (ref <5.0)
Triglycerides: 103 mg/dL (ref <150)

## 2021-10-29 ENCOUNTER — Other Ambulatory Visit: Payer: Self-pay | Admitting: Nurse Practitioner

## 2021-10-29 DIAGNOSIS — E782 Mixed hyperlipidemia: Secondary | ICD-10-CM

## 2021-11-03 ENCOUNTER — Other Ambulatory Visit: Payer: Self-pay | Admitting: Nurse Practitioner

## 2021-11-03 DIAGNOSIS — I10 Essential (primary) hypertension: Secondary | ICD-10-CM

## 2021-11-06 ENCOUNTER — Ambulatory Visit (INDEPENDENT_AMBULATORY_CARE_PROVIDER_SITE_OTHER): Payer: Medicare HMO | Admitting: Family

## 2021-11-06 ENCOUNTER — Other Ambulatory Visit: Payer: Self-pay

## 2021-11-06 ENCOUNTER — Encounter: Payer: Self-pay | Admitting: Family

## 2021-11-06 VITALS — BP 150/80 | HR 95 | Temp 97.7°F | Ht 70.0 in | Wt 212.2 lb

## 2021-11-06 DIAGNOSIS — R52 Pain, unspecified: Secondary | ICD-10-CM | POA: Diagnosis not present

## 2021-11-06 DIAGNOSIS — R059 Cough, unspecified: Secondary | ICD-10-CM

## 2021-11-06 DIAGNOSIS — R0989 Other specified symptoms and signs involving the circulatory and respiratory systems: Secondary | ICD-10-CM | POA: Diagnosis not present

## 2021-11-06 LAB — POCT INFLUENZA A/B
Influenza A, POC: NEGATIVE
Influenza B, POC: NEGATIVE

## 2021-11-06 MED ORDER — ACETAMINOPHEN 500 MG PO TABS: 500.0000 mg | ORAL_TABLET | Freq: Three times a day (TID) | ORAL | 0 refills | Status: DC | PRN

## 2021-11-06 MED ORDER — AZITHROMYCIN 250 MG PO TABS: ORAL_TABLET | ORAL | 0 refills | Status: AC

## 2021-11-06 NOTE — Patient Instructions (Signed)
-   increase fluids/ tea/soup intake   - Take Mucinex as needed for cough   - extra strength Tylenol 500 mg tablet one by mouth every 8 hrs as needed for aches or fever/chills

## 2021-11-06 NOTE — Progress Notes (Signed)
Provider: Adelina Collard FNP-C  [name], NP  Patient Care Team: [name], NP as PCP - General (Geriatric Medicine)  Extended Emergency Contact Information Primary Emergency Contact: Doughty,Sandy Address: [address]          [facility] [place], [place] [address] Johnnette Litter of [place] Phone: [phone] Mobile Phone: [phone] Relation: Significant other  Code Status:  Full Code  Goals of care: Advanced Directive information Advanced Directives 04/17/2021  Does Patient Have a Medical Advance Directive? Yes  Type of Advance Directive Living will  Does patient want to make changes to medical advance directive? No - Patient declined  Copy of [place] in Chart? -  Would patient like information on creating a medical advance directive? -     Chief Complaint  Patient presents with  . Acute Visit    Patient having cough and congestion. Patient states he has cold from CPAP machine. Patient has been coughing with green phlegm. He has congestion. Patient requesting Zpak. Patient aching all over. Started Saturday night. Thinks it may be due to unclean water in machine. Patient has been using Mucinex    HPI:  Pt is a 71 y.o. male seen today for an acute visit for evaluation of cough  Runny nose,coughing up green phlegm and nasal congestion X 3 days.States feels ache all over the body.He associates his symptoms to using his CPAP machine without changing the water and tubing after not using it for a while.  Had to force him self to eat had a sandwich    Past Medical History:  Diagnosis Date  . Backache, unspecified   . Bronchitis   . Cough variant asthma 05/03/2017  . Genital herpes   . Gout   . Hearing loss   . Hemorrhoids   . Hernia   . Herpes simplex   . Hyperglycemia   . Hyperlipidemia   . Hypertension   . Impotence   . Insomnia, unspecified   . Osteoarthrosis, unspecified whether generalized or localized, unspecified  site   . Tinea pedis, right   . Unspecified disorder of male genital organs    Past Surgical History:  Procedure Laterality Date  . fracture left femur  1984  . ganglion repair of left forarm  2000  . IVP  69   ML old comp fracture  . KNEE SURGERY Right 01/21/2019   [place] [facility]   . NASAL SEPTUM SURGERY  1997  . TONSILLECTOMY  1959  . TONSILLECTOMY    . 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   Procedure: TOTAL KNEE ARTHROPLASTY;  Surgeon: Earlie Server, MD;  Location: [facility];  Service: Orthopedics;  Laterality: Right;  . TOTAL KNEE ARTHROPLASTY Left 05/06/2020   Procedure: TOTAL KNEE ARTHROPLASTY;  Surgeon: Earlie Server, MD;  Location: [facility];  Service: Orthopedics;  Laterality: Left;  . UMBILICAL HERNIA REPAIR  2002    No Known Allergies  Outpatient Encounter Medications as of 11/06/2021  Medication Sig  . acyclovir (ZOVIRAX) 400 MG tablet TAKE 1 TABLET BY MOUTH EVERY DAY  . amLODipine (NORVASC) 5 MG tablet TAKE ONE TABLET BY MOUTH ONCE DAILY TO CONTROL BLOOD PRESSURE  . atorvastatin (LIPITOR) 20 MG tablet TAKE 1 TABLET BY MOUTH EVERYDAY AT BEDTIME  . febuxostat (ULORIC) 40 MG tablet Take 1 tablet (40 mg total) by mouth daily.  Marland Kitchen guaiFENesin (MUCINEX) 600 MG 12 hr tablet Take 600-1,200 mg by mouth at bedtime as needed for cough or to loosen phlegm.  Marland Kitchen  losartan (COZAAR) 100 MG tablet TAKE ONE BY MOUTH DAILY FOR BLOOD PRESSURE CONTROL  . Multiple Vitamins-Minerals (ANTIOXIDANT PO) Take 30 mLs by mouth daily. Aronia Berry Juice  . omeprazole (PRILOSEC) 20 MG capsule Take 20 mg by mouth daily.  . tadalafil (CIALIS) 20 MG tablet Take 1 tablet (20 mg total) by mouth daily as needed for erectile dysfunction.  Marland Kitchen zolpidem (AMBIEN) 5 MG tablet Take one tablet by mouth at bedtime as needed for sleep   No facility-administered encounter medications on file as of 11/06/2021.    Review of Systems  Constitutional:  Positive for appetite change. Negative for chills, fatigue, fever and  unexpected weight change.       Generalized body aches   HENT:  Positive for congestion and rhinorrhea. Negative for dental problem, ear discharge, ear pain, facial swelling, hearing loss, nosebleeds, postnasal drip, sinus pressure, sinus pain, sneezing, sore throat, tinnitus and trouble swallowing.   Eyes:  Negative for pain, discharge, redness, itching and visual disturbance.  Respiratory:  Positive for cough. Negative for chest tightness, shortness of breath and wheezing.   Cardiovascular:  Negative for chest pain, palpitations and leg swelling.  Gastrointestinal:  Negative for abdominal distention, abdominal pain, constipation, diarrhea, nausea and vomiting.  Musculoskeletal:  Positive for myalgias. Negative for arthralgias, back pain, gait problem, joint swelling, neck pain and neck stiffness.  Skin:  Negative for color change, pallor and rash.  Neurological:  Negative for dizziness, weakness, light-headedness, numbness and headaches.   Immunization History  Administered Date(s) Administered  . Fluad Quad(high Dose 65+) 08/20/2019, 10/28/2020, 10/09/2021  . Influenza, High Dose Seasonal PF 10/17/2017, 09/05/2018  . Influenza,inj,Quad PF,6+ Mos 12/02/2013, 03/07/2016, 10/10/2016  . Influenza-Unspecified 12/05/2011  . PFIZER(Purple Top)SARS-COV-2 Vaccination 01/22/2020, 02/12/2020, 09/14/2020  . Pneumococcal Conjugate-13 08/26/2017  . Pneumococcal Polysaccharide-23 09/05/2018  . Td 12/31/1994, 12/03/2013  . Zoster Recombinat (Shingrix) 04/15/2018, 08/04/2018   Pertinent  Health Maintenance Due  Topic Date Due  . COLONOSCOPY (Pts 45-4yrs Insurance coverage will need to be confirmed)  11/18/2022  . INFLUENZA VACCINE  Completed   Fall Risk 05/06/2020 05/07/2020 09/15/2020 10/28/2020 04/17/2021  Falls in the past year? - - 0 0 0  Number of falls in past year - - - - -  Was there an injury with Fall? - - 0 0 0  Was there an injury with Fall? - - - - -  Fall Risk Category Calculator - - 0 0  0  Fall Risk Category - - Low Low Low  Patient Fall Risk Level High fall risk High fall risk Low fall risk Low fall risk Low fall risk   Functional Status Survey:    Vitals:   11/06/21 1415  BP: (!) 150/80  Pulse: 95  Temp: 97.7 F (36.5 C)  SpO2: 95%  Weight: 212 lb 3.2 oz (96.3 kg)  Height: 5\' 10"  (1.778 m)   Body mass index is 30.45 kg/m. Physical Exam Vitals reviewed.  Constitutional:      General: He is not in acute distress.    Appearance: Normal appearance. He is obese. He is not ill-appearing or diaphoretic.  HENT:     Head: Normocephalic.     Right Ear: Tympanic membrane, ear canal and external ear normal. There is no impacted cerumen.     Left Ear: Tympanic membrane, ear canal and external ear normal. There is no impacted cerumen.     Nose: Nose normal. No congestion or rhinorrhea.     Mouth/Throat:     Mouth:  Mucous membranes are moist.     Pharynx: Oropharynx is clear. No oropharyngeal exudate or posterior oropharyngeal erythema.  Eyes:     General: No scleral icterus.       Right eye: No discharge.        Left eye: No discharge.     Extraocular Movements: Extraocular movements intact.     Conjunctiva/sclera: Conjunctivae normal.     Pupils: Pupils are equal, round, and reactive to light.  Neck:     Vascular: No carotid bruit.  Cardiovascular:     Rate and Rhythm: Normal rate and regular rhythm.     Pulses: Normal pulses.     Heart sounds: Normal heart sounds. No murmur heard.   No friction rub. No gallop.  Pulmonary:     Effort: Pulmonary effort is normal. No respiratory distress.     Breath sounds: Examination of the right-upper field reveals rales. Examination of the left-upper field reveals rales. Examination of the right-middle field reveals rales. Examination of the left-middle field reveals rales. Rales present. No wheezing or rhonchi.  Chest:     Chest wall: No tenderness.  Abdominal:     General: Bowel sounds are normal. There is no distension.      Palpations: Abdomen is soft. There is no mass.     Tenderness: There is no abdominal tenderness. There is no right CVA tenderness, left CVA tenderness, guarding or rebound.  Musculoskeletal:        General: No swelling or tenderness. Normal range of motion.     Cervical back: Normal range of motion. No rigidity or tenderness.     Right lower leg: No edema.     Left lower leg: No edema.  Lymphadenopathy:     Cervical: No cervical adenopathy.  Skin:    General: Skin is warm and dry.     Coloration: Skin is not pale.     Findings: No bruising, erythema, lesion or rash.  Neurological:     Mental Status: He is alert and oriented to person, place, and time.     Cranial Nerves: No cranial nerve deficit.     Sensory: No sensory deficit.     Motor: No weakness.     Coordination: Coordination normal.     Gait: Gait normal.  Psychiatric:        Mood and Affect: Mood normal.        Speech: Speech normal.        Behavior: Behavior normal.        Thought Content: Thought content normal.        Judgment: Judgment normal.    Labs reviewed: Recent Labs    04/10/21 0842 10/09/21 1340  NA 140 134*  K 4.5 4.1  CL 103 99  CO2 28 26  GLUCOSE 95 133  BUN 13 12  CREATININE 0.81 0.79  CALCIUM 9.5 9.6   Recent Labs    04/10/21 0842 10/09/21 1340  AST 19 18  ALT 21 14  BILITOT 0.4 0.6  PROT 7.1 7.1   Recent Labs    10/09/21 1340  WBC 9.2  NEUTROABS 5,511  HGB 16.0  HCT 46.0  MCV 94.7  PLT 273   No results found for: TSH Lab Results  Component Value Date   HGBA1C 5.5 01/20/2019   Lab Results  Component Value Date   CHOL 194 10/09/2021   HDL 72 10/09/2021   LDLCALC 102 (H) 10/09/2021   TRIG 103 10/09/2021   CHOLHDL 2.7 10/09/2021  Significant Diagnostic Results in last 30 days:  No results found.  Assessment/Plan. 1. Cough in adult Afebrile  - no contact with sick person with COVID-19  Thinks cough occurred due to not changing water and CPAP tubing - will  treat clinically with Z-pak as below side effects discussed.  - SARS-COV-2 RNA,(COVID-19) QUAL NAAT - POC Influenza A/B - azithromycin (ZITHROMAX) 250 MG tablet; Take 2 tablets on day 1, then 1 tablet daily on days 2 through 5  Dispense: 6 tablet; Refill: 0 - advised to notify provider or go to ED if symptoms worsen.  - states will never use CPAP machine prior to changing the water /tubing and cleaning.   2. Runny nose Influenza A/B are negative  - SARS-COV-2 RNA,(COVID-19) QUAL NAAT - POC Influenza A/B  3. Generalized body aches Afebrile  - advised to take Tylenol as needed for body aches or fever/chills  - SARS-COV-2 RNA,(COVID-19) QUAL NAAT - POC Influenza A/B - acetaminophen (TYLENOL) 500 MG tablet; Take 1 tablet (500 mg total) by mouth every 8 (eight) hours as needed for fever or moderate pain.  Dispense: 30 tablet; Refill: 0   Family/ staff Communication: Reviewed plan of care with patient  Labs/tests ordered: None   Next Appointment: As needed if symptoms worsen or fail to improve    Sandrea Hughs, NP

## 2021-11-07 LAB — SARS-COV-2 RNA,(COVID-19) QUALITATIVE NAAT: SARS CoV2 RNA: NOT DETECTED

## 2021-11-10 ENCOUNTER — Other Ambulatory Visit: Payer: Self-pay

## 2021-11-10 ENCOUNTER — Telehealth (INDEPENDENT_AMBULATORY_CARE_PROVIDER_SITE_OTHER): Payer: Medicare HMO | Admitting: Orthopedic Surgery

## 2021-11-10 ENCOUNTER — Encounter: Payer: Self-pay | Admitting: Orthopedic Surgery

## 2021-11-10 DIAGNOSIS — M5441 Lumbago with sciatica, right side: Secondary | ICD-10-CM | POA: Diagnosis not present

## 2021-11-10 MED ORDER — METHOCARBAMOL 500 MG PO TABS: 500.0000 mg | ORAL_TABLET | Freq: Three times a day (TID) | ORAL | 0 refills | Status: DC | PRN

## 2021-11-10 MED ORDER — PREDNISONE 10 MG PO TABS: ORAL_TABLET | ORAL | 0 refills | Status: AC

## 2021-11-10 NOTE — Progress Notes (Signed)
  This service is provided via telemedicine  No vital signs collected/recorded due to the encounter was a telemedicine visit.   Location of patient (ex: home, work):  Home  Patient consents to a telephone visit:  Yes  Location of the provider (ex: office, home):  [facility].   Name of any referring provider:  Eubanks, Jessica K, NP   Names of all persons participating in the telemedicine service and their role in the encounter:  Patient, Trevor Wilkie, RMA, Amy Fargo, NP.    Time spent on call: 8 minutes spent on the phone with Medical Assistant.   

## 2021-11-10 NOTE — Progress Notes (Addendum)
Careteam: Patient Care Team: [name], NP as PCP - General (Geriatric Medicine)  Seen by: Windell Moulding, AGNP-C  PLACE OF SERVICE:  [place] Directive information Does Patient Have a Medical Advance Directive?: No, Would patient like information on creating a medical advance directive?: No - Patient declined  No Known Allergies  No chief complaint on file.    HPI: Patient is a 71 y.o. male seen today via telephone for acute back pain.   11/10 he was looking for his dog and believes he hurt his back. Denies falling.  He woke up this morning being unable to move without severe pain. Pain located to right lower back with radiation to back of right knee. Pain rated 8/10. Pain described as stabbing/constant. He has not tried any medication for pain relief. He [place] the beach with [place].    Review of Systems:  Review of Systems  Constitutional:  Negative for chills, fever, malaise/fatigue and weight loss.  Respiratory:  Negative for cough, shortness of breath and wheezing.   Cardiovascular:  Negative for chest pain and leg swelling.  Musculoskeletal:  Positive for back pain. Negative for falls.  Psychiatric/Behavioral:  Negative for depression. The patient is not nervous/anxious.    Past Medical History:  Diagnosis Date   Backache, unspecified    Bronchitis    Cough variant asthma 05/03/2017   Genital herpes    Gout    Hearing loss    Hemorrhoids    Hernia    Herpes simplex    Hyperglycemia    Hyperlipidemia    Hypertension    Impotence    Insomnia, unspecified    Osteoarthrosis, unspecified whether generalized or localized, unspecified site    Tinea pedis, right    Unspecified disorder of male genital organs    Past Surgical History:  Procedure Laterality Date   fracture left femur  1984   ganglion repair of left forarm  2000   IVP  2   ML old comp fracture   KNEE SURGERY Right 01/21/2019   [place]   TONSILLECTOMY     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 His smoking use included cigarettes. He has a 15.00 pack-year smoking history. He quit smokeless tobacco use about 32 years ago. He reports current alcohol use of about 6.0 - 7.0 standard drinks per week. He reports that he does not use drugs.  Family History  Problem Relation Age of Onset   COPD Mother    Aortic aneurysm Father     Medications: Patient's Medications  New Prescriptions   No medications on file  Previous Medications   ACETAMINOPHEN (TYLENOL) 500 MG TABLET    Take 1 tablet (500 mg total) by mouth every 8 (eight) hours as needed for fever or moderate pain.   ACYCLOVIR (ZOVIRAX) 400 MG TABLET    TAKE 1 TABLET BY MOUTH EVERY DAY   AMLODIPINE (NORVASC) 5 MG TABLET    TAKE ONE TABLET BY MOUTH ONCE DAILY TO  CONTROL BLOOD PRESSURE   ATORVASTATIN (LIPITOR) 20 MG TABLET    TAKE 1 TABLET BY MOUTH EVERYDAY AT BEDTIME   AZITHROMYCIN (ZITHROMAX) 250 MG TABLET    Take 2 tablets on day 1, then 1 tablet daily on days 2 through 5   FEBUXOSTAT (ULORIC) 40 MG TABLET    Take 1 tablet (40 mg total) by mouth daily.   GUAIFENESIN (MUCINEX) 600 MG 12 HR TABLET    Take 600-1,200 mg by mouth at bedtime as needed for cough or to loosen phlegm.   LOSARTAN (COZAAR) 100 MG TABLET    TAKE ONE BY MOUTH DAILY FOR BLOOD PRESSURE CONTROL   MULTIPLE VITAMINS-MINERALS (ANTIOXIDANT PO)    Take 30 mLs by mouth daily. Aronia Berry Juice   OMEPRAZOLE (PRILOSEC) 20 MG CAPSULE    Take 20 mg by mouth daily.   TADALAFIL (CIALIS) 20  MG TABLET    Take 1 tablet (20 mg total) by mouth daily as needed for erectile dysfunction.   ZOLPIDEM (AMBIEN) 5 MG TABLET    Take one tablet by mouth at bedtime as needed for sleep  Modified Medications   No medications on file  Discontinued Medications   No medications on file    Physical Exam:  There were no vitals filed for this visit. There is no height or weight on file to calculate BMI. Wt Readings from Last 3 Encounters:  11/06/21 212 lb 3.2 oz (96.3 kg)  10/09/21 214 lb 6.4 oz (97.3 kg)  04/17/21 220 lb (99.8 kg)    Physical Exam UTA due to telephone visit  Labs reviewed: Basic Metabolic Panel: Recent Labs    04/10/21 0842 10/09/21 1340  NA 140 134*  K 4.5 4.1  CL 103 99  CO2 28 26  GLUCOSE 95 133  BUN 13 12  CREATININE 0.81 0.79  CALCIUM 9.5 9.6   Liver Function Tests: Recent Labs    04/10/21 0842 10/09/21 1340  AST 19 18  ALT 21 14  BILITOT 0.4 0.6  PROT 7.1 7.1   No results for input(s): LIPASE, AMYLASE in the last 8760 hours. No results for input(s): AMMONIA in the last 8760 hours. CBC: Recent Labs    10/09/21 1340  WBC 9.2  NEUTROABS 5,511  HGB 16.0  HCT 46.0  MCV 94.7  PLT 273   Lipid Panel: Recent Labs    04/10/21 0842 10/09/21 1340  CHOL 222* 194  HDL 65 72  LDLCALC 120* 102*  TRIG 244* 103  CHOLHDL 3.4 2.7   TSH: No results for input(s): TSH in the last 8760 hours. A1C: Lab Results  Component Value Date   HGBA1C 5.5 01/20/2019     Assessment/Plan 1. Acute right-sided low back pain with right-sided sciatica - reports intense pain to right lower back with radiation to right knee - recommend tylenol 1000 mg po TID prn for pain - recommend avoiding bending/pulling/ twisting movements - recommend light walking - advised to see  PCP if symptoms do not improve - methocarbamol (ROBAXIN) 500 MG tablet; Take 1 tablet (500 mg total) by mouth 3 (three) times daily as needed for muscle spasms.  Dispense: 30 tablet; Refill:  0 - predniSONE (DELTASONE) 10 MG tablet; Take 5 tablets (50 mg total) by mouth daily with breakfast for 1 day, THEN 4 tablets (40 mg total) daily with breakfast for 1 day, THEN 3 tablets (30 mg total) daily with breakfast for 1 day, THEN 2 tablets (20 mg total) daily with breakfast for 1 day,  THEN 1 tablet (10 mg total) daily with breakfast for 1 day, THEN 0.5 tablets (5 mg total) daily with breakfast for 1 day.  Dispense: 15.5 tablet; Refill: 0  Total time: 11 minutes. Greater than 50% of total time spent doing patient education regarding lower back pain.   Telephone Note   I connected with Debera Lat by telephone and verified that I am speaking with the correct person using two identifiers.   Patient: Rodney Foster Patient location: home Provider: Windell Moulding, AGNP-C Provider location: [facility]    I discussed the limitations, risks, security and privacy concerns of performing an evaluation and management service by telephone and the availability of in person appointments. I also discussed with the patient that there may be a patient responsible charge related to this service. The patient expressed understanding and agreed to proceed.    I discussed the assessment and treatment plan with the patient. The patient was provided an opportunity to ask questions and all were answered. The patient agreed with the plan and demonstrated an understanding of the instructions.     The patient was advised to call back or seek an in-person evaluation if the symptoms worsen or if the condition fails to improve as anticipated.   I provided 10 minutes of non-face-to-face time during this encounter.   Brynley Cuddeback Cleophas Dunker, [place] printed and mailed    Next appt: Visit date not found  [place], [facility] [phone]

## 2021-11-13 ENCOUNTER — Other Ambulatory Visit: Payer: Self-pay

## 2021-11-13 ENCOUNTER — Ambulatory Visit (INDEPENDENT_AMBULATORY_CARE_PROVIDER_SITE_OTHER): Payer: Medicare HMO | Admitting: Orthopedic Surgery

## 2021-11-13 ENCOUNTER — Encounter: Payer: Self-pay | Admitting: Orthopedic Surgery

## 2021-11-13 VITALS — BP 148/80 | HR 87 | Temp 97.8°F | Resp 18 | Ht 70.0 in | Wt 220.6 lb

## 2021-11-13 DIAGNOSIS — M5441 Lumbago with sciatica, right side: Secondary | ICD-10-CM

## 2021-11-13 NOTE — Progress Notes (Signed)
Careteam: Patient Care Team: [name], NP as PCP - General (Geriatric Medicine)  Seen by: Windell Moulding, AGNP-C  PLACE OF SERVICE:  [place] Directive information Does Patient Have a Medical Advance Directive?: No, Would patient like information on creating a medical advance directive?: No - Patient declined  No Known Allergies  Chief Complaint  Patient presents with   Acute Visit    Patient complains of back pain with no relief from muscle relaxer's.      HPI: Patient is a 71 y.o. male seen today for acute visit due to low back pain.   We spoke via telephone 11/10/2021. Onset of back pain began 11/09/2021 while chasing his dog. He was started on prednisone taper and robaxin. Today, he continues to have severe pain. Pain rated 8/10, described as sharp. Denies loss of urinary/bowel function. Pain radiates down right leg. He has 2 days left of prednisone left. Does not believe muscle relaxer is helpful. He is also using [place] patches, applying ice to area and taking extra strength tylenol every 4-6 hours. He is requesting to see a specialist and have xray done. Past knee surgery with Dr. French Ana at [facility].     Review of Systems:  Review of Systems  Constitutional:  Negative for chills, fever, malaise/fatigue and weight loss.  Respiratory:  Negative for cough, shortness of breath and wheezing.   Cardiovascular:  Negative for chest pain and leg swelling.  Musculoskeletal:  Positive for back pain and myalgias. Negative for falls and joint pain.  Skin: Negative.   Neurological:  Positive for tingling and weakness.  Psychiatric/Behavioral:  Negative for depression. The patient is not nervous/anxious.    Past Medical History:  Diagnosis Date   Backache, unspecified    Bronchitis    Cough variant asthma 05/03/2017   Genital herpes    Gout    Hearing loss    Hemorrhoids    Hernia    Herpes simplex    Hyperglycemia    Hyperlipidemia     Hypertension    Impotence    Insomnia, unspecified    Osteoarthrosis, unspecified whether generalized or localized, unspecified site    Tinea pedis, right    Unspecified disorder of male genital organs    Past Surgical History:  Procedure Laterality Date   fracture left femur  1984   ganglion repair of left forarm  2000   IVP  64   ML old comp fracture   KNEE SURGERY Right 01/21/2019   [place]   TONSILLECTOMY     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 His smoking use included cigarettes. He has a 15.00 pack-year smoking history. He quit smokeless tobacco use about 32 years ago. He reports current alcohol use of about 6.0 - 7.0 standard drinks per week. He reports that he does not use drugs.  Family History  Problem Relation Age of Onset   COPD Mother    Aortic aneurysm Father     Medications: Patient's Medications  New Prescriptions   No medications on file  Previous Medications   ACETAMINOPHEN (TYLENOL) 500 MG TABLET    Take 1 tablet (500 mg total) by mouth every 8 (eight) hours as needed for fever or moderate pain.   ACYCLOVIR (ZOVIRAX) 400 MG TABLET    TAKE 1 TABLET BY MOUTH EVERY DAY   AMLODIPINE (NORVASC) 5 MG TABLET    TAKE ONE TABLET BY MOUTH ONCE DAILY TO CONTROL BLOOD PRESSURE   ATORVASTATIN (LIPITOR) 20 MG TABLET    TAKE 1 TABLET BY MOUTH EVERYDAY AT BEDTIME   FEBUXOSTAT (ULORIC) 40 MG TABLET    Take 1 tablet (40 mg total) by mouth daily.   GUAIFENESIN (MUCINEX) 600 MG 12 HR TABLET    Take 600-1,200 mg by  mouth at bedtime as needed for cough or to loosen phlegm.   LOSARTAN (COZAAR) 100 MG TABLET    TAKE ONE BY MOUTH DAILY FOR BLOOD PRESSURE CONTROL   METHOCARBAMOL (ROBAXIN) 500 MG TABLET    Take 1 tablet (500 mg total) by mouth 3 (three) times daily as needed for muscle spasms.   MULTIPLE VITAMINS-MINERALS (ANTIOXIDANT PO)    Take 30 mLs by mouth daily. Aronia Berry Juice   OMEPRAZOLE (PRILOSEC) 20 MG CAPSULE    Take 20 mg by mouth daily.   PREDNISONE (DELTASONE) 10 MG TABLET    Take 5 tablets (50 mg total) by mouth daily with breakfast for 1 day, THEN 4 tablets (40 mg total) daily with breakfast for 1 day, THEN 3 tablets (30 mg total) daily with breakfast for 1 day, THEN 2 tablets (20 mg total) daily with breakfast for 1 day, THEN 1 tablet (10 mg total) daily with breakfast for 1 day, THEN 0.5 tablets (5 mg total) daily with breakfast for 1 day.   TADALAFIL (CIALIS) 20 MG TABLET    Take 1 tablet (20 mg total) by mouth daily as needed for erectile dysfunction.   ZOLPIDEM (AMBIEN) 5 MG TABLET    Take one tablet by mouth at bedtime as needed for sleep  Modified Medications   No medications on file  Discontinued Medications   No medications on file    Physical Exam:  Vitals:   11/13/21 1527  BP: (!) 148/80  Pulse: 87  Resp: 18  Temp: 97.8 F (36.6 C)  SpO2: 97%  Weight: 220 lb 9.6 oz (100.1 kg)  Height: 5\' 10"  (1.778 m)   Body mass index is 31.65 kg/m. Wt Readings from Last 3 Encounters:  11/13/21 220 lb 9.6 oz (100.1 kg)  11/06/21 212 lb 3.2 oz (96.3 kg)  10/09/21 214 lb 6.4 oz (97.3 kg)    Physical Exam Vitals reviewed.  Constitutional:      General: He is not in acute distress. HENT:     Head: Normocephalic.  Cardiovascular:     Rate and Rhythm: Normal rate and regular rhythm.     Pulses: Normal pulses.     Heart sounds: Normal heart sounds. No murmur heard. Pulmonary:     Effort: Pulmonary effort is normal. No respiratory distress.     Breath sounds: Normal breath  sounds. No wheezing.  Abdominal:     General: Bowel sounds are normal. There is no distension.     Palpations: Abdomen is soft.     Tenderness: There is no abdominal tenderness.  Musculoskeletal:        General: Tenderness present. No swelling, deformity or signs of injury.     Cervical back: Normal.  Thoracic back: Normal.     Lumbar back: Tenderness present. No swelling. Decreased range of motion.     Right lower leg: No edema.     Left lower leg: No edema.     Comments: Tenderness over right lower back, sciatic area. Pain increased with lumbar flexion/extension and lateral bending.   Skin:    General: Skin is warm and dry.     Capillary Refill: Capillary refill takes less than 2 seconds.  Neurological:     General: No focal deficit present.     Mental Status: He is alert and oriented to person, place, and time.     Motor: Weakness present.     Gait: Gait abnormal.  Psychiatric:        Mood and Affect: Mood normal.        Behavior: Behavior normal.    Labs reviewed: Basic Metabolic Panel: Recent Labs    04/10/21 0842 10/09/21 1340  NA 140 134*  K 4.5 4.1  CL 103 99  CO2 28 26  GLUCOSE 95 133  BUN 13 12  CREATININE 0.81 0.79  CALCIUM 9.5 9.6   Liver Function Tests: Recent Labs    04/10/21 0842 10/09/21 1340  AST 19 18  ALT 21 14  BILITOT 0.4 0.6  PROT 7.1 7.1   No results for input(s): LIPASE, AMYLASE in the last 8760 hours. No results for input(s): AMMONIA in the last 8760 hours. CBC: Recent Labs    10/09/21 1340  WBC 9.2  NEUTROABS 5,511  HGB 16.0  HCT 46.0  MCV 94.7  PLT 273   Lipid Panel: Recent Labs    04/10/21 0842 10/09/21 1340  CHOL 222* 194  HDL 65 72  LDLCALC 120* 102*  TRIG 244* 103  CHOLHDL 3.4 2.7   TSH: No results for input(s): TSH in the last 8760 hours. A1C: Lab Results  Component Value Date   HGBA1C 5.5 01/20/2019     Assessment/Plan 1. Acute right-sided low back pain with right-sided sciatica - pain 8/10 to  right lumbar, radiates to right knee- suspect impingement/ spinal stenosis? - prednisone taper and robaxin unsuccessful with pain relief - followed by Dr. French Ana in past- advised to visit [facility] - cont tylenol 1000 mg po tid - recommend using voltaren gel 1 %- apply to lower back 3-4x/day - recommend lumbar xray - recommend outpatient PT  Total time: 15 minutes. Greater than 50 % of total time spent doing patient education regarding low back pain, symptom management and medication management.    Next appt: none Aritza Brunet [place], [facility] [phone]

## 2021-11-13 NOTE — Patient Instructions (Signed)
Voltaren gel 1 %- apply to lower back 3-4 times daily for pain  Continue to tylenol- 1000 mg three times daily  Continue ice- 20 minutes on/off

## 2021-11-14 DIAGNOSIS — M545 Low back pain, unspecified: Secondary | ICD-10-CM | POA: Diagnosis not present

## 2021-11-16 ENCOUNTER — Telehealth: Payer: Self-pay

## 2021-11-16 NOTE — Telephone Encounter (Signed)
Incoming call received from patient stating he has coughed so hard and so long that he now has a golf ball size knot near his anus and it feels like a tooth ache.  Patient states he is limited in his mobility due to these symptoms. Patient requested earliest appointment available. Patient will see [place] tomorrow at 1:30 and aware if symptoms progress to seek medical attention at the emergency room   Message forwarded to [name], NP and [facility]

## 2021-11-17 ENCOUNTER — Ambulatory Visit: Payer: Medicare HMO | Admitting: Nurse Practitioner

## 2021-11-17 ENCOUNTER — Other Ambulatory Visit: Payer: Self-pay

## 2021-11-17 ENCOUNTER — Ambulatory Visit (INDEPENDENT_AMBULATORY_CARE_PROVIDER_SITE_OTHER): Payer: Medicare HMO | Admitting: Adult Health

## 2021-11-17 ENCOUNTER — Encounter: Payer: Self-pay | Admitting: Adult Health

## 2021-11-17 VITALS — BP 150/100 | HR 103 | Temp 97.3°F

## 2021-11-17 DIAGNOSIS — K648 Other hemorrhoids: Secondary | ICD-10-CM | POA: Diagnosis not present

## 2021-11-17 DIAGNOSIS — M5441 Lumbago with sciatica, right side: Secondary | ICD-10-CM | POA: Diagnosis not present

## 2021-11-17 DIAGNOSIS — G4700 Insomnia, unspecified: Secondary | ICD-10-CM

## 2021-11-17 DIAGNOSIS — K5901 Slow transit constipation: Secondary | ICD-10-CM

## 2021-11-17 MED ORDER — GABAPENTIN 100 MG PO CAPS: 100.0000 mg | ORAL_CAPSULE | Freq: Two times a day (BID) | ORAL | 0 refills | Status: DC

## 2021-11-17 MED ORDER — ZOLPIDEM TARTRATE 5 MG PO TABS: ORAL_TABLET | ORAL | 0 refills | Status: DC

## 2021-11-17 NOTE — Progress Notes (Signed)
[facility] clinic  Provider:  Durenda Age  DNP  Code Status:  Full Code  Goals of Care:  Advanced Directives 11/13/2021  Does Patient Have a Medical Advance Directive? No  Type of Advance Directive -  Does patient want to make changes to medical advance directive? -  Copy of [place] in Chart? -  Would patient like information on creating a medical advance directive? No - Patient declined     Chief Complaint  Patient presents with   Acute Visit    Patient presents today for pain. He reports golf ball sized knot near anus. He reports applying ice to the area.     HPI: Patient is a 71 y.o. male seen today for an acute visit for a "golf-sized ball" knot in his anus. He stated that he was seen by his orthopedics last 11/14/21,  and was given injection. He was talking loudly and repeatedly stated that he is  "tired from being in pain and don't mind going to the hospital since he has a medical insurance" Discussed that it is his option to go to the hospital or not. He stated that he continues to have lower back pain. He said that coughed out and then noticed the golf-sized ball in his anus. Assisted him to the exam table and positioned him on his left-side. No noted golf-sized ball knot. No noted bruising. Noted small external hemorrhoids that are not inflamed. He said that he has been applying ice on it for pain. He complains that he is always constipated and has been applying Preparation H to his hemorrhoids. He stated that he had BM yesterday after having no BM X 2 days.  He stated that he used a dirty CPAP machine and got sick. He recently completed a course of Z-pack ordered on 11/06/21.  On 11/10/21, he had a video visit for low back pain and was prescribed Robaxin 500 mg TID PRN and Prednisone taper. He, again, was seen in the clinic on 11/13/21 for lower back pain and was advised to follow up with [facility] and recommended Voltaren gel 1% to lower  back 3-4 X/day.   Past Medical History:  Diagnosis Date   Backache, unspecified    Bronchitis    Cough variant asthma 05/03/2017   Genital herpes    Gout    Hearing loss    Hemorrhoids    Hernia    Herpes simplex    Hyperglycemia    Hyperlipidemia    Hypertension    Impotence    Insomnia, unspecified    Osteoarthrosis, unspecified whether generalized or localized, unspecified site    Tinea pedis, right    Unspecified disorder of male genital organs     Past Surgical History:  Procedure Laterality Date   fracture left femur  1984   ganglion repair of left forarm  2000   IVP  41   ML old comp fracture   KNEE SURGERY Right 01/21/2019   [place]   TONSILLECTOMY     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   Procedure: TOTAL KNEE ARTHROPLASTY;  Surgeon: Earlie Server, MD;  Location: [facility];  Service: Orthopedics;  Laterality: Right;   TOTAL KNEE ARTHROPLASTY Left 05/06/2020   Procedure: TOTAL KNEE ARTHROPLASTY;  Surgeon: Earlie Server, MD;  Location: [facility];  Service: Orthopedics;  Laterality: Left;   UMBILICAL HERNIA REPAIR  2002    No Known  Allergies  Outpatient Encounter Medications as of 11/17/2021  Medication Sig   acetaminophen (TYLENOL) 500 MG tablet Take 1 tablet (500 mg total) by mouth every 8 (eight) hours as needed for fever or moderate pain.   acyclovir (ZOVIRAX) 400 MG tablet TAKE 1 TABLET BY MOUTH EVERY DAY   amLODipine (NORVASC) 5 MG tablet TAKE ONE TABLET BY MOUTH ONCE DAILY TO CONTROL BLOOD PRESSURE   atorvastatin (LIPITOR) 20 MG tablet TAKE 1 TABLET BY MOUTH EVERYDAY AT BEDTIME   febuxostat (ULORIC) 40 MG tablet Take 1 tablet (40 mg total) by mouth daily.   guaiFENesin (MUCINEX) 600 MG 12 hr tablet Take 600-1,200 mg by mouth at bedtime as needed for cough or to loosen phlegm.   losartan (COZAAR) 100 MG tablet TAKE ONE BY MOUTH DAILY FOR BLOOD PRESSURE CONTROL   methocarbamol (ROBAXIN) 500 MG  tablet Take 1 tablet (500 mg total) by mouth 3 (three) times daily as needed for muscle spasms.   Multiple Vitamins-Minerals (ANTIOXIDANT PO) Take 30 mLs by mouth daily. Aronia Berry Juice   omeprazole (PRILOSEC) 20 MG capsule Take 20 mg by mouth daily.   tadalafil (CIALIS) 20 MG tablet Take 1 tablet (20 mg total) by mouth daily as needed for erectile dysfunction.   zolpidem (AMBIEN) 5 MG tablet Take one tablet by mouth at bedtime as needed for sleep   No facility-administered encounter medications on file as of 11/17/2021.    Review of Systems:  Review of Systems  Constitutional:  Negative for activity change and appetite change.       Uses a cane now walking around  HENT:  Negative for congestion.   Eyes: Negative.   Respiratory:  Negative for cough.   Gastrointestinal:  Positive for constipation and rectal pain. Negative for abdominal pain.  Genitourinary:  Negative for dysuria.  Musculoskeletal:  Positive for back pain.  Neurological:  Negative for dizziness.  Psychiatric/Behavioral:  Positive for sleep disturbance.    Health Maintenance  Topic Date Due   COVID-19 Vaccine (4 - Booster for Pfizer series) 11/09/2020   COLONOSCOPY (Pts 45-51yrs Insurance coverage will need to be confirmed)  11/18/2022   TETANUS/TDAP  12/04/2023   Pneumonia Vaccine 53+ Years old  Completed   INFLUENZA VACCINE  Completed   Hepatitis C Screening  Completed   Zoster Vaccines- Shingrix  Completed   HPV VACCINES  Aged Out    Physical Exam: Vitals:   11/17/21 1323  BP: (!) 150/100  Pulse: (!) 103  Temp: (!) 97.3 F (36.3 C)  SpO2: 96%   There is no height or weight on file to calculate BMI. Physical Exam HENT:     Head: Normocephalic.  Cardiovascular:     Rate and Rhythm: Normal rate and regular rhythm.  Pulmonary:     Effort: Pulmonary effort is normal.     Breath sounds: Normal breath sounds.  Abdominal:     General: Bowel sounds are normal.     Palpations: Abdomen is soft.   Musculoskeletal:        General: No swelling.     Right lower leg: No edema.     Left lower leg: No edema.  Skin:    General: Skin is warm.  Neurological:     Mental Status: He is alert.   Labs reviewed: Basic Metabolic Panel: Recent Labs    04/10/21 0842 10/09/21 1340  NA 140 134*  K 4.5 4.1  CL 103 99  CO2 28 26  GLUCOSE 95 133  BUN 13  12  CREATININE 0.81 0.79  CALCIUM 9.5 9.6   Liver Function Tests: Recent Labs    04/10/21 0842 10/09/21 1340  AST 19 18  ALT 21 14  BILITOT 0.4 0.6  PROT 7.1 7.1   CBC: Recent Labs    10/09/21 1340  WBC 9.2  NEUTROABS 5,511  HGB 16.0  HCT 46.0  MCV 94.7  PLT 273   Lipid Panel: Recent Labs    04/10/21 0842 10/09/21 1340  CHOL 222* 194  HDL 65 72  LDLCALC 120* 102*  TRIG 244* 103  CHOLHDL 3.4 2.7   Lab Results  Component Value Date   HGBA1C 5.5 01/20/2019     Assessment/Plan  1. Other hemorrhoids -   instructed to continue using Preparation- H PRN for his external hemorrhoids   2. Acute right-sided low back pain with right-sided sciatica -  continue Voltaren gel and Robaxin PRN -   follow-up with orthopedics - gabapentin (NEURONTIN) 100 MG capsule; Take 1 capsule (100 mg total) by mouth 2 (two) times daily.  Dispense: 60 capsule; Refill: 0  3. Insomnia, unspecified type -  requested refill of medication - zolpidem (AMBIEN) 5 MG tablet; Take one tablet by mouth at bedtime as needed for sleep  Dispense: 30 tablet; Refill: 0  4.  Slow transit constipation -   continue Miralax daily -   advised to increase fruits and vegetable intake and recommended to drink kale smoothie  Labs/tests ordered:  None  Next appt:  11/21/2021 .          +

## 2021-11-17 NOTE — Patient Instructions (Signed)
Hemorrhoids Hemorrhoids are swollen veins that may develop: In the butt (rectum). These are called internal hemorrhoids. Around the opening of the butt (anus). These are called external hemorrhoids. Hemorrhoids can cause pain, itching, or bleeding. Most of the time, they do not cause serious problems. They usually get better with diet changes, lifestyle changes, and other home treatments. What are the causes? This condition may be caused by: Having trouble pooping (constipation). Pushing hard (straining) to poop. Watery poop (diarrhea). Pregnancy. Being very overweight (obese). Sitting for long periods of time. Heavy lifting or other activity that causes you to strain. Anal sex. Riding a bike for a long period of time. What are the signs or symptoms? Symptoms of this condition include: Pain. Itching or soreness in the butt. Bleeding from the butt. Leaking poop. Swelling in the area. One or more lumps around the opening of your butt. How is this diagnosed? A doctor can often diagnose this condition by looking at the affected area. The doctor may also: Do an exam that involves feeling the area with a gloved hand (digital rectal exam). Examine the area inside your butt using a small tube (anoscope). Order blood tests. This may be done if you have lost a lot of blood. Have you get a test that involves looking inside the colon using a flexible tube with a camera on the end (sigmoidoscopy or colonoscopy). How is this treated? This condition can usually be treated at home. Your doctor may tell you to change what you eat, make lifestyle changes, or try home treatments. If these do not help, procedures can be done to remove the hemorrhoids or make them smaller. These may involve: Placing rubber bands at the base of the hemorrhoids to cut off their blood supply. Injecting medicine into the hemorrhoids to shrink them. Shining a type of light energy onto the hemorrhoids to cause them to fall  off. Doing surgery to remove the hemorrhoids or cut off their blood supply. Follow these instructions at home: Eating and drinking  Eat foods that have a lot of fiber in them. These include whole grains, beans, nuts, fruits, and vegetables. Ask your doctor about taking products that have added fiber (fibersupplements). Reduce the amount of fat in your diet. You can do this by: Eating low-fat dairy products. Eating less red meat. Avoiding processed foods. Drink enough fluid to keep your pee (urine) pale yellow. Managing pain and swelling  Take a warm-water bath (sitz bath) for 20 minutes to ease pain. Do this 3-4 times a day. You may do this in a bathtub or using a portable sitz bath that fits over the toilet. If told, put ice on the painful area. It may be helpful to use ice between your warm baths. Put ice in a plastic bag. Place a towel between your skin and the bag. Leave the ice on for 20 minutes, 2-3 times a day. General instructions Take over-the-counter and prescription medicines only as told by your doctor. Medicated creams and medicines may be used as told. Exercise often. Ask your doctor how much and what kind of exercise is best for you. Go to the bathroom when you have the urge to poop. Do not wait. Avoid pushing too hard when you poop. Keep your butt dry and clean. Use wet toilet paper or moist towelettes after pooping. Do not sit on the toilet for a long time. Keep all follow-up visits as told by your doctor. This is important. Contact a doctor if you: Have pain and   swelling that do not get better with treatment or medicine. Have trouble pooping. Cannot poop. Have pain or swelling outside the area of the hemorrhoids. Get help right away if you have: Bleeding that will not stop. Summary Hemorrhoids are swollen veins in the butt or around the opening of the butt. They can cause pain, itching, or bleeding. Eat foods that have a lot of fiber in them. These include  whole grains, beans, nuts, fruits, and vegetables. Take a warm-water bath (sitz bath) for 20 minutes to ease pain. Do this 3-4 times a day. This information is not intended to replace advice given to you by your health care provider. Make sure you discuss any questions you have with your health care provider. Document Revised: 06/28/2021 Document Reviewed: 06/28/2021 Elsevier Patient Education  [place].

## 2021-11-21 ENCOUNTER — Other Ambulatory Visit: Payer: Self-pay

## 2021-11-21 ENCOUNTER — Encounter: Payer: Self-pay | Admitting: Nurse Practitioner

## 2021-11-21 ENCOUNTER — Telehealth: Payer: Self-pay

## 2021-11-21 ENCOUNTER — Ambulatory Visit (INDEPENDENT_AMBULATORY_CARE_PROVIDER_SITE_OTHER): Payer: Medicare HMO | Admitting: Nurse Practitioner

## 2021-11-21 DIAGNOSIS — Z0000 Encounter for general adult medical examination without abnormal findings: Secondary | ICD-10-CM

## 2021-11-21 NOTE — Progress Notes (Signed)
Subjective:   Rodney Foster is a 71 y.o. male who presents for Medicare Annual/Subsequent preventive examination.  Review of Systems     Cardiac Risk Factors include: obesity (BMI >30kg/m2);dyslipidemia;hypertension;advanced age (>4men, >14 women)     Objective:    There were no vitals filed for this visit. There is no height or weight on file to calculate BMI.  Advanced Directives 11/21/2021 11/13/2021 11/10/2021 04/17/2021 10/28/2020 09/15/2020 05/06/2020  Does Patient Have a Medical Advance Directive? Yes No No Yes No Yes -  Type of Advance Directive Living will - - Living will - Living will [place];Living will  Does patient want to make changes to medical advance directive? - - - No - Patient declined - No - Patient declined No - Patient declined  Copy of [place] in Chart? - - - - - - No - copy requested  Would patient like information on creating a medical advance directive? - No - Patient declined No - Patient declined - - - -    Current Medications (verified) Outpatient Encounter Medications as of 11/21/2021  Medication Sig   acetaminophen (TYLENOL) 500 MG tablet Take 1 tablet (500 mg total) by mouth every 8 (eight) hours as needed for fever or moderate pain.   acyclovir (ZOVIRAX) 400 MG tablet TAKE 1 TABLET BY MOUTH EVERY DAY   amLODipine (NORVASC) 5 MG tablet TAKE ONE TABLET BY MOUTH ONCE DAILY TO CONTROL BLOOD PRESSURE   atorvastatin (LIPITOR) 20 MG tablet TAKE 1 TABLET BY MOUTH EVERYDAY AT BEDTIME   febuxostat (ULORIC) 40 MG tablet Take 1 tablet (40 mg total) by mouth daily.   gabapentin (NEURONTIN) 100 MG capsule Take 1 capsule (100 mg total) by mouth 2 (two) times daily.   guaiFENesin (MUCINEX) 600 MG 12 hr tablet Take 600-1,200 mg by mouth at bedtime as needed for cough or to loosen phlegm.   HYDROcodone-acetaminophen (NORCO/VICODIN) 5-325 MG tablet Take 1 tablet by mouth every 8 (eight) hours as needed.   losartan (COZAAR) 100  MG tablet TAKE ONE BY MOUTH DAILY FOR BLOOD PRESSURE CONTROL   methocarbamol (ROBAXIN) 500 MG tablet Take 1 tablet (500 mg total) by mouth 3 (three) times daily as needed for muscle spasms.   Multiple Vitamins-Minerals (ANTIOXIDANT PO) Take 30 mLs by mouth daily. Aronia Berry Juice   omeprazole (PRILOSEC) 20 MG capsule Take 20 mg by mouth daily.   tadalafil (CIALIS) 20 MG tablet Take 1 tablet (20 mg total) by mouth daily as needed for erectile dysfunction.   tiZANidine (ZANAFLEX) 4 MG tablet Take 4 mg by mouth every 8 (eight) hours as needed.   triamcinolone acetonide (KENALOG-40) 40 MG/ML injection SMARTSIG:2 Milliliter(s) IM   zolpidem (AMBIEN) 5 MG tablet Take one tablet by mouth at bedtime as needed for sleep   No facility-administered encounter medications on file as of 11/21/2021.    Allergies (verified) Patient has no known allergies.   History: Past Medical History:  Diagnosis Date   Backache, unspecified    Bronchitis    Cough variant asthma 05/03/2017   Genital herpes    Gout    Hearing loss    Hemorrhoids    Hernia    Herpes simplex    Hyperglycemia    Hyperlipidemia    Hypertension    Impotence    Insomnia, unspecified    Osteoarthrosis, unspecified whether generalized or localized, unspecified site    Tinea pedis, right    Unspecified disorder of male genital organs  Past Surgical History:  Procedure Laterality Date   fracture left femur  1984   ganglion repair of left forarm  2000   IVP  36   ML old comp fracture   KNEE SURGERY Right 01/21/2019   [facility]alie[facility] Ip    NASAL SEPTUM SURGERY  1997   TONSILLECTOMY  1959   TONSILLECTOMY     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   Procedure: TOTAL KNEE ARTHROPLASTY;  Surgeon: Earlie Server, MD;  Location: [facility];  Service: Orthopedics;  Laterality: Right;   TOTAL KNEE ARTHROPLASTY Left 05/06/2020   Procedure: TOTAL KNEE ARTHROPLASTY;  Surgeon: Earlie Server, MD;  Location: [facility];  Service: Orthopedics;   Laterality: Left;   UMBILICAL HERNIA REPAIR  2002   Family History  Problem Relation Age of Onset   COPD Mother    Aortic aneurysm Father    Social History   Socioeconomic History   Marital status: Divorced    Spouse name: Not on file   Number of children: Not on file   Years of education: Not on file   Highest education level: Not on file  Occupational History   Not on file  Tobacco Use   Smoking status: Former    Packs/day: 1.00    Years: 15.00    Pack years: 15.00    Types: Cigarettes    Quit date: 01/01/1984    Years since quitting: 37.9   Smokeless tobacco: Former    Quit date: 1990  Vaping Use   Vaping Use: Never used  Substance and Sexual Activity   Alcohol use: Yes    Alcohol/week: 6.0 - 7.0 standard drinks    Types: 6 - 7 Glasses of wine per week    Comment: 3-4 a night of wine or cocktail   Drug use: No   Sexual activity: Yes  Other Topics Concern   Not on file  Social History Narrative   Not on file   Social Determinants of Health   Financial Resource Strain: Not on file  Food Insecurity: Not on file  Transportation Needs: Not on file  Physical Activity: Not on file  Stress: Not on file  Social Connections: Not on file    Tobacco Counseling Counseling given: Not Answered   Clinical Intake:  Pre-visit preparation completed: Yes  Pain : 0-10 Pain Type: Acute pain Pain Location: Back Pain Orientation: Right Pain Descriptors / Indicators: Spasm Pain Onset: 1 to 4 weeks ago Pain Frequency: Constant     BMI - recorded: 31 Nutritional Status: BMI > 30  Obese Diabetes: No  How often do you need to have someone help you when you read instructions, pamphlets, or other written materials from your doctor or pharmacy?: 1 - Never  Diabetic?no         Activities of Daily Living In your present state of health, do you have any difficulty performing the following activities: 11/21/2021  Hearing? N  Vision? N  Difficulty concentrating or  making decisions? N  Walking or climbing stairs? N  Dressing or bathing? N  Doing errands, shopping? N  Preparing Food and eating ? N  Using the Toilet? N  In the past six months, have you accidently leaked urine? N  Do you have problems with loss of bowel control? N  Managing your Medications? N  Managing your Finances? N  Housekeeping or managing your Housekeeping? N  Some recent data might be hidden    Patient Care Team: [name], NP as PCP - General (  Geriatric Medicine)  Indicate any recent Medical Services you may have received from other than [facility] providers in the past year (date may be approximate).     Assessment:   This is a routine wellness examination for Rodney Foster.  Hearing/Vision screen Hearing Screening - Comments:: Patient has no hearing problems. Vision Screening - Comments:: No vision problems. Patient has not had an eye exam. Patient sees [place] [facility].  Dietary issues and exercise activities discussed: Current Exercise Habits: Home exercise routine, Type of exercise: stretching;strength training/weights, Time (Minutes): 45, Frequency (Times/Week): 3, Weekly Exercise (Minutes/Week): 135   Goals Addressed   None    Depression Screen PHQ 2/9 Scores 11/21/2021 04/17/2021 10/28/2020 09/15/2020 09/15/2019 09/15/2019 09/05/2018  PHQ - 2 Score 0 0 0 0 0 0 0    Fall Risk Fall Risk  11/21/2021 11/13/2021 11/10/2021 04/17/2021 10/28/2020  Falls in the past year? 1 0 0 0 0  Number falls in past yr: 1 0 0 0 0  Comment - - - - -  Injury with Fall? 1 0 0 0 0  Comment - - - - -  Risk for fall due to : History of fall(s) No Fall Risks No Fall Risks - -  Follow up Falls evaluation completed Falls evaluation completed Falls evaluation completed - -    FALL RISK PREVENTION PERTAINING TO THE HOME:  Any stairs in or around the home? Yes  If so, are there any without handrails? No  Home free of loose throw rugs in walkways, pet beds, electrical cords, etc? Yes   Adequate lighting in your home to reduce risk of falls? Yes   ASSISTIVE DEVICES UTILIZED TO PREVENT FALLS:  Life alert? No  Use of a cane, walker or w/c? Yes  Grab bars in the bathroom? No  Shower chair or bench in shower? Yes  Elevated toilet seat or a handicapped toilet? No   TIMED UP AND GO:  Was the test performed? No .    Cognitive Function: MMSE - Mini Mental State Exam 09/05/2018 08/26/2017 10/10/2016  Orientation to time 5 5 5   Orientation to Place 5 5 5   Registration 3 3 3   Attention/ Calculation 5 5 5   Recall 2 2 3   Language- name 2 objects 2 2 2   Language- repeat 1 1 1   Language- follow 3 step command 3 2 3   Language- read & follow direction 1 1 1   Write a sentence 1 1 1   Copy design 1 1 1   Total score 29 28 30      6CIT Screen 11/21/2021 09/15/2020 09/15/2019  What Year? 0 points 0 points 0 points  What month? 0 points 0 points 0 points  What time? 0 points 0 points 0 points  Count back from 20 0 points 0 points 0 points  Months in reverse 0 points 0 points 0 points  Repeat phrase 0 points 0 points 0 points  Total Score 0 0 0    Immunizations Immunization History  Administered Date(s) Administered   Fluad Quad(high Dose 65+) 08/20/2019, 10/28/2020, 10/09/2021   Influenza, High Dose Seasonal PF 10/17/2017, 09/05/2018   Influenza,inj,Quad PF,6+ Mos 12/02/2013, 03/07/2016, 10/10/2016   Influenza-Unspecified 12/05/2011   PFIZER(Purple Top)SARS-COV-2 Vaccination 01/22/2020, 02/12/2020, 09/14/2020, 04/18/2021   Pfizer Covid-19 Vaccine Bivalent Booster 57yrs & up 09/19/2021   Pneumococcal Conjugate-13 08/26/2017   Pneumococcal Polysaccharide-23 09/05/2018   Td 12/31/1994, 12/03/2013   Zoster Recombinat (Shingrix) 04/15/2018, 08/04/2018    TDAP status: Up to date  Flu Vaccine status: Up to  date  Pneumococcal vaccine status: Up to date  Covid-19 vaccine status: Completed vaccines  Qualifies for Shingles Vaccine? Yes   Zostavax completed No   Shingrix  Completed?: Yes  Screening Tests Health Maintenance  Topic Date Due   COLONOSCOPY (Pts 45-54yrs Insurance coverage will need to be confirmed)  11/18/2022   TETANUS/TDAP  12/04/2023   Pneumonia Vaccine 8+ Years old  Completed   INFLUENZA VACCINE  Completed   COVID-19 Vaccine  Completed   Hepatitis C Screening  Completed   Zoster Vaccines- Shingrix  Completed   HPV VACCINES  Aged Out    Health Maintenance  There are no preventive care reminders to display for this patient.  Colorectal cancer screening: Type of screening: Colonoscopy. Completed 2013. Repeat every 10 years  Lung Cancer Screening: (Low Dose CT Chest recommended if Age 49-80 years, 30 pack-year currently smoking OR have quit w/in 15years.) does not qualify.     Additional Screening:  Hepatitis C Screening: does qualify; Completed   Vision Screening: Recommended annual ophthalmology exams for early detection of glaucoma and other disorders of the eye. Is the patient up to date with their annual eye exam?  No  Who is the provider or what is the name of the office in which the patient attends annual eye exams? [facility] If pt is not established with a provider, would they like to be referred to a provider to establish care? No .   Dental Screening: Recommended annual dental exams for proper oral hygiene  Community Resource Referral / Chronic Care Management: CRR required this visit?  No   CCM required this visit?  No      Plan:     I have personally reviewed and noted the following in the patient's chart:   Medical and social history Use of alcohol, tobacco or illicit drugs  Current medications and supplements including opioid prescriptions. Patient is currently taking opioid prescriptions. Information provided to patient regarding non-opioid alternatives. Patient advised to discuss non-opioid treatment plan with their provider. Functional ability and status Nutritional status Physical activity Advanced  directives List of other physicians Hospitalizations, surgeries, and ER visits in previous 12 months Vitals Screenings to include cognitive, depression, and falls Referrals and appointments  In addition, I have reviewed and discussed with patient certain preventive protocols, quality metrics, and best practice recommendations. A written personalized care plan for preventive services as well as general preventive health recommendations were provided to patient.     [name], NP   11/21/2021    Virtual Visit via Telephone Note  I connected withNAME@ on 11/21/21 at  4:15 PM EST by telephone and verified that I am speaking with the correct person using two identifiers.  Location: Patient: home Provider: [facility]    I discussed the limitations, risks, security and privacy concerns of performing an evaluation and management service by telephone and the availability of in person appointments. I also discussed with the patient that there may be a patient responsible charge related to this service. The patient expressed understanding and agreed to proceed.   I discussed the assessment and treatment plan with the patient. The patient was provided an opportunity to ask questions and all were answered. The patient agreed with the plan and demonstrated an understanding of the instructions.   The patient was advised to call back or seek an in-person evaluation if the symptoms worsen or if the condition fails to improve as anticipated.  I provided 18 minutes of non-face-to-face time during this  encounter.  Carlos American. Harle Battiest Av[employer] printed and mailed

## 2021-11-21 NOTE — Patient Instructions (Signed)
Rodney Foster , Thank you for taking time to come for your Medicare Wellness Visit. I appreciate your ongoing commitment to your health goals. Please review the following plan we discussed and let me know if I can assist you in the future.   Screening recommendations/referrals: Colonoscopy up to date- due 2023 Recommended yearly ophthalmology/optometry visit for glaucoma screening and checkup Recommended yearly dental visit for hygiene and checkup  Vaccinations: Influenza vaccine up to date Pneumococcal vaccine up to date Tdap vaccineup to date  Shingles vaccine up to date    Advanced directives: to complete and bring to office so we can place on file.   Conditions/risks identified: advance age  Next appointment: yearly for AWV  Preventive Care 62 Years and Older, Male Preventive care refers to lifestyle choices and visits with your health care provider that can promote health and wellness. What does preventive care include? A yearly physical exam. This is also called an annual well check. Dental exams once or twice a year. Routine eye exams. Ask your health care provider how often you should have your eyes checked. Personal lifestyle choices, including: Daily care of your teeth and gums. Regular physical activity. Eating a healthy diet. Avoiding tobacco and drug use. Limiting alcohol use. Practicing safe sex. Taking low doses of aspirin every day. Taking vitamin and mineral supplements as recommended by your health care provider. What happens during an annual well check? The services and screenings done by your health care provider during your annual well check will depend on your age, overall health, lifestyle risk factors, and family history of disease. Counseling  Your health care provider may ask you questions about your: Alcohol use. Tobacco use. Drug use. Emotional well-being. Home and relationship well-being. Sexual activity. Eating habits. History of falls. Memory  and ability to understand (cognition). Work and work Statistician. Screening  You may have the following tests or measurements: Height, weight, and BMI. Blood pressure. Lipid and cholesterol levels. These may be checked every 5 years, or more frequently if you are over 22 years old. Skin check. Lung cancer screening. You may have this screening every year starting at age 54 if you have a 30-pack-year history of smoking and currently smoke or have quit within the past 15 years. Fecal occult blood test (FOBT) of the stool. You may have this test every year starting at age 47. Flexible sigmoidoscopy or colonoscopy. You may have a sigmoidoscopy every 5 years or a colonoscopy every 10 years starting at age 6. Prostate cancer screening. Recommendations will vary depending on your family history and other risks. Hepatitis C blood test. Hepatitis B blood test. Sexually transmitted disease (STD) testing. Diabetes screening. This is done by checking your blood sugar (glucose) after you have not eaten for a while (fasting). You may have this done every 1-3 years. Abdominal aortic aneurysm (AAA) screening. You may need this if you are a current or former smoker. Osteoporosis. You may be screened starting at age 34 if you are at high risk. Talk with your health care provider about your test results, treatment options, and if necessary, the need for more tests. Vaccines  Your health care provider may recommend certain vaccines, such as: Influenza vaccine. This is recommended every year. Tetanus, diphtheria, and acellular pertussis (Tdap, Td) vaccine. You may need a Td booster every 10 years. Zoster vaccine. You may need this after age 81. Pneumococcal 13-valent conjugate (PCV13) vaccine. One dose is recommended after age 27. Pneumococcal polysaccharide (PPSV23) vaccine. One dose is recommended  after age 30. Talk to your health care provider about which screenings and vaccines you need and how often you  need them. This information is not intended to replace advice given to you by your health care provider. Make sure you discuss any questions you have with your health care provider. Document Released: 01/13/2016 Document Revised: 09/05/2016 Document Reviewed: 10/18/2015 Elsevier Interactive Patient Education  2017 [place] Prevention in the Home Falls can cause injuries. They can happen to people of all ages. There are many things you can do to make your home safe and to help prevent falls. What can I do on the outside of my home? Regularly fix the edges of walkways and driveways and fix any cracks. Remove anything that might make you trip as you walk through a door, such as a raised step or threshold. Trim any bushes or trees on the path to your home. Use bright outdoor lighting. Clear any walking paths of anything that might make someone trip, such as rocks or tools. Regularly check to see if handrails are loose or broken. Make sure that both sides of any steps have handrails. Any raised decks and porches should have guardrails on the edges. Have any leaves, snow, or ice cleared regularly. Use sand or salt on walking paths during winter. Clean up any spills in your garage right away. This includes oil or grease spills. What can I do in the bathroom? Use night lights. Install grab bars by the toilet and in the tub and shower. Do not use towel bars as grab bars. Use non-skid mats or decals in the tub or shower. If you need to sit down in the shower, use a plastic, non-slip stool. Keep the floor dry. Clean up any water that spills on the floor as soon as it happens. Remove soap buildup in the tub or shower regularly. Attach bath mats securely with double-sided non-slip rug tape. Do not have throw rugs and other things on the floor that can make you trip. What can I do in the bedroom? Use night lights. Make sure that you have a light by your bed that is easy to reach. Do not  use any sheets or blankets that are too big for your bed. They should not hang down onto the floor. Have a firm chair that has side arms. You can use this for support while you get dressed. Do not have throw rugs and other things on the floor that can make you trip. What can I do in the kitchen? Clean up any spills right away. Avoid walking on wet floors. Keep items that you use a lot in easy-to-reach places. If you need to reach something above you, use a strong step stool that has a grab bar. Keep electrical cords out of the way. Do not use floor polish or wax that makes floors slippery. If you must use wax, use non-skid floor wax. Do not have throw rugs and other things on the floor that can make you trip. What can I do with my stairs? Do not leave any items on the stairs. Make sure that there are handrails on both sides of the stairs and use them. Fix handrails that are broken or loose. Make sure that handrails are as long as the stairways. Check any carpeting to make sure that it is firmly attached to the stairs. Fix any carpet that is loose or worn. Avoid having throw rugs at the top or bottom of the stairs. If you  do have throw rugs, attach them to the floor with carpet tape. Make sure that you have a light switch at the top of the stairs and the bottom of the stairs. If you do not have them, ask someone to add them for you. What else can I do to help prevent falls? Wear shoes that: Do not have high heels. Have rubber bottoms. Are comfortable and fit you well. Are closed at the toe. Do not wear sandals. If you use a stepladder: Make sure that it is fully opened. Do not climb a closed stepladder. Make sure that both sides of the stepladder are locked into place. Ask someone to hold it for you, if possible. Clearly mark and make sure that you can see: Any grab bars or handrails. First and last steps. Where the edge of each step is. Use tools that help you move around (mobility  aids) if they are needed. These include: Canes. Walkers. Scooters. Crutches. Turn on the lights when you go into a dark area. Replace any light bulbs as soon as they burn out. Set up your furniture so you have a clear path. Avoid moving your furniture around. If any of your floors are uneven, fix them. If there are any pets around you, be aware of where they are. Review your medicines with your doctor. Some medicines can make you feel dizzy. This can increase your chance of falling. Ask your doctor what other things that you can do to help prevent falls. This information is not intended to replace advice given to you by your health care provider. Make sure you discuss any questions you have with your health care provider. Document Released: 10/13/2009 Document Revised: 05/24/2016 Document Reviewed: 01/21/2015 Elsevier Interactive Patient Education  2017 [employer].

## 2021-11-21 NOTE — Progress Notes (Signed)
This service is provided via telemedicine  No vital signs collected/recorded due to the encounter was a telemedicine visit.   Location of patient (ex: home, work):  Home  Patient consents to a telephone visit:  Yes, see encounter dated 11/21/2021  Location of the provider (ex: office, home):  [place]  Name of any referring provider:  N/A  Names of all persons participating in the telemedicine service and their role in the encounter:  Sherrie Mustache, Nurse Practitioner, Carroll Kinds, CMA, and patient.   Time spent on call:  16 minutes with medical assistant

## 2021-11-21 NOTE — Telephone Encounter (Signed)
Mr. dametrius, sanjuan are scheduled for a virtual visit with your provider today.    Just as we do with appointments in the office, we must obtain your consent to participate.  Your consent will be active for this visit and any virtual visit you may have with one of our providers in the next 365 days.    If you have a MyChart account, I can also send a copy of this consent to you electronically.  All virtual visits are billed to your insurance company just like a traditional visit in the office.  As this is a virtual visit, video technology does not allow for your provider to perform a traditional examination.  This may limit your provider's ability to fully assess your condition.  If your provider identifies any concerns that need to be evaluated in person or the need to arrange testing such as labs, EKG, etc, we will make arrangements to do so.    Although advances in technology are sophisticated, we cannot ensure that it will always work on either your end or our end.  If the connection with a video visit is poor, we may have to switch to a telephone visit.  With either a video or telephone visit, we are not always able to ensure that we have a secure connection.   I need to obtain your verbal consent now.   Are you willing to proceed with your visit today?   JANTZ MAIN has provided verbal consent on 11/21/2021 for a virtual visit (video or telephone).   Carroll Kinds, [facility] 11/21/2021  4:29 PM

## 2021-11-22 DIAGNOSIS — M545 Low back pain, unspecified: Secondary | ICD-10-CM | POA: Diagnosis not present

## 2021-11-27 ENCOUNTER — Telehealth: Payer: Self-pay

## 2021-11-27 NOTE — Telephone Encounter (Signed)
Incoming call received outside of business hours (left on clinical intake voicemail).  Patient called stating he is still with cough and soreness. Patient had a MRI performed and would like to know the results. Patient also questions if he should seek medical attention at the hospital.  Left message on voicemail for patient to return call when available, reason for call was to follow-up on message left and offer patient an appointment to be seen today  Side note: I am not sure how patient was able to leave a voicemail on [facility] outside of business hours versus getting the on call provider.  Awaiting return call

## 2021-11-27 NOTE — Telephone Encounter (Signed)
Patient called back stating he plans to follow-up with the specialist who ordered the MRI. Patients will call us for an appointment if needed.

## 2021-12-04 DIAGNOSIS — M545 Low back pain, unspecified: Secondary | ICD-10-CM | POA: Diagnosis not present

## 2021-12-04 DIAGNOSIS — M4722 Other spondylosis with radiculopathy, cervical region: Secondary | ICD-10-CM | POA: Diagnosis not present

## 2021-12-05 ENCOUNTER — Emergency Department (HOSPITAL_COMMUNITY): Payer: Medicare HMO

## 2021-12-05 ENCOUNTER — Other Ambulatory Visit: Payer: Self-pay

## 2021-12-05 ENCOUNTER — Emergency Department (HOSPITAL_COMMUNITY)
Admission: EM | Admit: 2021-12-05 | Discharge: 2021-12-05 | Disposition: A | Discharge status: Home / Self Care | Payer: Medicare HMO | Source: Home / Self Care

## 2021-12-05 DIAGNOSIS — Z96653 Presence of artificial knee joint, bilateral: Secondary | ICD-10-CM | POA: Diagnosis present

## 2021-12-05 DIAGNOSIS — Z79899 Other long term (current) drug therapy: Secondary | ICD-10-CM | POA: Diagnosis not present

## 2021-12-05 DIAGNOSIS — J90 Pleural effusion, not elsewhere classified: Secondary | ICD-10-CM | POA: Diagnosis not present

## 2021-12-05 DIAGNOSIS — Z8249 Family history of ischemic heart disease and other diseases of the circulatory system: Secondary | ICD-10-CM | POA: Diagnosis not present

## 2021-12-05 DIAGNOSIS — M4856XA Collapsed vertebra, not elsewhere classified, lumbar region, initial encounter for fracture: Secondary | ICD-10-CM | POA: Diagnosis not present

## 2021-12-05 DIAGNOSIS — Z7982 Long term (current) use of aspirin: Secondary | ICD-10-CM | POA: Diagnosis not present

## 2021-12-05 DIAGNOSIS — J42 Unspecified chronic bronchitis: Secondary | ICD-10-CM | POA: Diagnosis not present

## 2021-12-05 DIAGNOSIS — Z0279 Encounter for issue of other medical certificate: Secondary | ICD-10-CM | POA: Insufficient documentation

## 2021-12-05 DIAGNOSIS — J45909 Unspecified asthma, uncomplicated: Secondary | ICD-10-CM | POA: Diagnosis present

## 2021-12-05 DIAGNOSIS — F1010 Alcohol abuse, uncomplicated: Secondary | ICD-10-CM | POA: Diagnosis not present

## 2021-12-05 DIAGNOSIS — J918 Pleural effusion in other conditions classified elsewhere: Secondary | ICD-10-CM | POA: Diagnosis not present

## 2021-12-05 DIAGNOSIS — R079 Chest pain, unspecified: Secondary | ICD-10-CM | POA: Diagnosis not present

## 2021-12-05 DIAGNOSIS — D72828 Other elevated white blood cell count: Secondary | ICD-10-CM | POA: Diagnosis present

## 2021-12-05 DIAGNOSIS — K449 Diaphragmatic hernia without obstruction or gangrene: Secondary | ICD-10-CM | POA: Diagnosis not present

## 2021-12-05 DIAGNOSIS — E669 Obesity, unspecified: Secondary | ICD-10-CM | POA: Diagnosis not present

## 2021-12-05 DIAGNOSIS — R799 Abnormal finding of blood chemistry, unspecified: Secondary | ICD-10-CM | POA: Insufficient documentation

## 2021-12-05 DIAGNOSIS — Z87891 Personal history of nicotine dependence: Secondary | ICD-10-CM | POA: Diagnosis not present

## 2021-12-05 DIAGNOSIS — R0789 Other chest pain: Secondary | ICD-10-CM | POA: Diagnosis not present

## 2021-12-05 DIAGNOSIS — R0602 Shortness of breath: Secondary | ICD-10-CM | POA: Diagnosis not present

## 2021-12-05 DIAGNOSIS — I5022 Chronic systolic (congestive) heart failure: Secondary | ICD-10-CM | POA: Diagnosis not present

## 2021-12-05 DIAGNOSIS — J40 Bronchitis, not specified as acute or chronic: Secondary | ICD-10-CM | POA: Insufficient documentation

## 2021-12-05 DIAGNOSIS — R0609 Other forms of dyspnea: Secondary | ICD-10-CM | POA: Diagnosis not present

## 2021-12-05 DIAGNOSIS — J9601 Acute respiratory failure with hypoxia: Secondary | ICD-10-CM | POA: Diagnosis not present

## 2021-12-05 DIAGNOSIS — J984 Other disorders of lung: Secondary | ICD-10-CM | POA: Diagnosis not present

## 2021-12-05 DIAGNOSIS — E785 Hyperlipidemia, unspecified: Secondary | ICD-10-CM | POA: Diagnosis present

## 2021-12-05 DIAGNOSIS — R0902 Hypoxemia: Secondary | ICD-10-CM | POA: Diagnosis not present

## 2021-12-05 DIAGNOSIS — K8020 Calculus of gallbladder without cholecystitis without obstruction: Secondary | ICD-10-CM | POA: Diagnosis not present

## 2021-12-05 DIAGNOSIS — R059 Cough, unspecified: Secondary | ICD-10-CM | POA: Diagnosis not present

## 2021-12-05 DIAGNOSIS — Z5321 Procedure and treatment not carried out due to patient leaving prior to being seen by health care provider: Secondary | ICD-10-CM | POA: Insufficient documentation

## 2021-12-05 DIAGNOSIS — K4020 Bilateral inguinal hernia, without obstruction or gangrene, not specified as recurrent: Secondary | ICD-10-CM | POA: Diagnosis not present

## 2021-12-05 DIAGNOSIS — K219 Gastro-esophageal reflux disease without esophagitis: Secondary | ICD-10-CM | POA: Diagnosis present

## 2021-12-05 DIAGNOSIS — M5136 Other intervertebral disc degeneration, lumbar region: Secondary | ICD-10-CM | POA: Diagnosis not present

## 2021-12-05 DIAGNOSIS — A419 Sepsis, unspecified organism: Secondary | ICD-10-CM | POA: Diagnosis not present

## 2021-12-05 DIAGNOSIS — M109 Gout, unspecified: Secondary | ICD-10-CM | POA: Diagnosis present

## 2021-12-05 DIAGNOSIS — Z20822 Contact with and (suspected) exposure to covid-19: Secondary | ICD-10-CM | POA: Diagnosis not present

## 2021-12-05 DIAGNOSIS — G4733 Obstructive sleep apnea (adult) (pediatric): Secondary | ICD-10-CM | POA: Diagnosis present

## 2021-12-05 DIAGNOSIS — J189 Pneumonia, unspecified organism: Secondary | ICD-10-CM | POA: Diagnosis not present

## 2021-12-05 DIAGNOSIS — I110 Hypertensive heart disease with heart failure: Secondary | ICD-10-CM | POA: Diagnosis not present

## 2021-12-05 DIAGNOSIS — E871 Hypo-osmolality and hyponatremia: Secondary | ICD-10-CM | POA: Diagnosis present

## 2021-12-05 LAB — COMPREHENSIVE METABOLIC PANEL
ALT: 17 U/L (ref 0–44)
AST: 15 U/L (ref 15–41)
Albumin: 3.2 g/dL — ABNORMAL LOW (ref 3.5–5.0)
Alkaline Phosphatase: 87 U/L (ref 38–126)
Anion gap: 11 (ref 5–15)
BUN: 9 mg/dL (ref 8–23)
CO2: 24 mmol/L (ref 22–32)
Calcium: 8.8 mg/dL — ABNORMAL LOW (ref 8.9–10.3)
Chloride: 97 mmol/L — ABNORMAL LOW (ref 98–111)
Creatinine, Ser: 0.88 mg/dL (ref 0.61–1.24)
GFR, Estimated: >60 mL/min (ref >60)
Glucose, Bld: 118 mg/dL — ABNORMAL HIGH (ref 70–99)
Potassium: 3.9 mmol/L (ref 3.5–5.1)
Sodium: 132 mmol/L — ABNORMAL LOW (ref 135–145)
Total Bilirubin: 0.4 mg/dL (ref 0.3–1.2)
Total Protein: 6.9 g/dL (ref 6.5–8.1)

## 2021-12-05 LAB — CBC WITH DIFFERENTIAL/PLATELET
Abs Immature Granulocytes: 0.06 10*3/uL (ref 0.00–0.07)
Basophils Absolute: 0.1 10*3/uL (ref 0.0–0.1)
Basophils Relative: 1 %
Eosinophils Absolute: 0.2 10*3/uL (ref 0.0–0.5)
Eosinophils Relative: 1 %
HCT: 40.6 % (ref 39.0–52.0)
Hemoglobin: 13.4 g/dL (ref 13.0–17.0)
Immature Granulocytes: 1 %
Lymphocytes Relative: 23 %
Lymphs Abs: 2.9 10*3/uL (ref 0.7–4.0)
MCH: 32.4 pg (ref 26.0–34.0)
MCHC: 33 g/dL (ref 30.0–36.0)
MCV: 98.1 fL (ref 80.0–100.0)
Monocytes Absolute: 1.6 10*3/uL — ABNORMAL HIGH (ref 0.1–1.0)
Monocytes Relative: 13 %
Neutro Abs: 7.9 10*3/uL — ABNORMAL HIGH (ref 1.7–7.7)
Neutrophils Relative %: 61 %
Platelets: 384 10*3/uL (ref 150–400)
RBC: 4.14 MIL/uL — ABNORMAL LOW (ref 4.22–5.81)
RDW: 12.2 % (ref 11.5–15.5)
WBC: 12.7 10*3/uL — ABNORMAL HIGH (ref 4.0–10.5)
nRBC: 0 % (ref 0.0–0.2)

## 2021-12-05 LAB — LACTIC ACID, PLASMA: Lactic Acid, Venous: 1.5 mmol/L (ref 0.5–1.9)

## 2021-12-05 LAB — TROPONIN I (HIGH SENSITIVITY): Troponin I (High Sensitivity): 10 ng/L (ref <18)

## 2021-12-05 NOTE — ED Notes (Signed)
PT decided he will see provider.PT left

## 2021-12-05 NOTE — ED Provider Notes (Signed)
Emergency Medicine Provider Triage Evaluation Note  Rodney Foster , a 71 y.o. male  was evaluated in triage.  Pt complains of abnormal labs. States he's had a cough for a couple weeks and states that he was seen at his primary care office where an ESR and CRP were obtained these were elevated and he was called today and told to come to the ER for evaluation.  Denies any joint swelling or pain currently.  States that somewhat short of breath.    He states that he is breathing how he normally breathes however.  States he uses a CPAP machine and was told he had a "dirty cpap" machine and was treated w zpack  Review of Systems  Positive: Cough, abn labs Negative: Fever   Physical Exam  BP (!) 148/81 (BP Location: Left Arm)   Pulse 95   Temp 98.6 F (37 C) (Oral)   Resp 19   SpO2 96%  Gen:   Awake, no distress   Resp:  Normal effort  MSK:   Moves extremities without difficulty  Other:    Medical Decision Making  Medically screening exam initiated at 7:11 PM.  Appropriate orders placed.  Rodney Foster was informed that the remainder of the evaluation will be completed by another provider, this initial triage assessment does not replace that evaluation, and the importance of remaining in the ED until their evaluation is complete.    Rodney Foster, [place] 12/05/21 1914    Rodney Dessert, MD 12/05/21 2138

## 2021-12-05 NOTE — ED Triage Notes (Signed)
Pt sent to ED by PCP for evaluation of abnormal labs. Pt seems unclear about why he is here. States he has had bronchitis and cough.

## 2021-12-05 NOTE — ED Provider Notes (Signed)
Informed by RN that she received a phone call from Dr. Ron Agee with orthopedics who is concerned about a possible infection in the L-spine.  Called patient back to triage to assess patient further.  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  At that time radiology recommended septic arthritis work-up.  Patient sent to the ED due to elevated ESR and CRP. Patient has entire report with him.   Karie Kirks 12/05/21 2017    Blanchie Dessert, MD 12/05/21 2138

## 2021-12-06 ENCOUNTER — Emergency Department (HOSPITAL_COMMUNITY): Payer: Medicare HMO

## 2021-12-06 ENCOUNTER — Encounter (HOSPITAL_COMMUNITY): Payer: Self-pay | Admitting: Emergency Medicine

## 2021-12-06 ENCOUNTER — Inpatient Hospital Stay (HOSPITAL_COMMUNITY)
Admission: EM | Admit: 2021-12-06 | Discharge: 2021-12-14 | DRG: 193 | Disposition: A | Discharge status: Home / Self Care | Payer: Medicare HMO | Attending: Student in an Organized Health Care Education/Training Program | Admitting: Student in an Organized Health Care Education/Training Program

## 2021-12-06 DIAGNOSIS — E871 Hypo-osmolality and hyponatremia: Secondary | ICD-10-CM | POA: Diagnosis present

## 2021-12-06 DIAGNOSIS — G4733 Obstructive sleep apnea (adult) (pediatric): Secondary | ICD-10-CM | POA: Diagnosis present

## 2021-12-06 DIAGNOSIS — F1010 Alcohol abuse, uncomplicated: Secondary | ICD-10-CM | POA: Diagnosis present

## 2021-12-06 DIAGNOSIS — K219 Gastro-esophageal reflux disease without esophagitis: Secondary | ICD-10-CM | POA: Diagnosis present

## 2021-12-06 DIAGNOSIS — I110 Hypertensive heart disease with heart failure: Secondary | ICD-10-CM | POA: Diagnosis present

## 2021-12-06 DIAGNOSIS — J45909 Unspecified asthma, uncomplicated: Secondary | ICD-10-CM | POA: Diagnosis present

## 2021-12-06 DIAGNOSIS — J42 Unspecified chronic bronchitis: Secondary | ICD-10-CM | POA: Diagnosis present

## 2021-12-06 DIAGNOSIS — Z8249 Family history of ischemic heart disease and other diseases of the circulatory system: Secondary | ICD-10-CM | POA: Diagnosis not present

## 2021-12-06 DIAGNOSIS — J90 Pleural effusion, not elsewhere classified: Secondary | ICD-10-CM

## 2021-12-06 DIAGNOSIS — Z7982 Long term (current) use of aspirin: Secondary | ICD-10-CM

## 2021-12-06 DIAGNOSIS — J9601 Acute respiratory failure with hypoxia: Secondary | ICD-10-CM | POA: Diagnosis present

## 2021-12-06 DIAGNOSIS — I5022 Chronic systolic (congestive) heart failure: Secondary | ICD-10-CM | POA: Diagnosis present

## 2021-12-06 DIAGNOSIS — Z96653 Presence of artificial knee joint, bilateral: Secondary | ICD-10-CM | POA: Diagnosis present

## 2021-12-06 DIAGNOSIS — J918 Pleural effusion in other conditions classified elsewhere: Secondary | ICD-10-CM | POA: Diagnosis present

## 2021-12-06 DIAGNOSIS — Z87891 Personal history of nicotine dependence: Secondary | ICD-10-CM | POA: Diagnosis not present

## 2021-12-06 DIAGNOSIS — J189 Pneumonia, unspecified organism: Secondary | ICD-10-CM

## 2021-12-06 DIAGNOSIS — R0609 Other forms of dyspnea: Secondary | ICD-10-CM | POA: Diagnosis not present

## 2021-12-06 DIAGNOSIS — K449 Diaphragmatic hernia without obstruction or gangrene: Secondary | ICD-10-CM | POA: Diagnosis not present

## 2021-12-06 DIAGNOSIS — Z79899 Other long term (current) drug therapy: Secondary | ICD-10-CM | POA: Diagnosis not present

## 2021-12-06 DIAGNOSIS — E669 Obesity, unspecified: Secondary | ICD-10-CM | POA: Diagnosis present

## 2021-12-06 DIAGNOSIS — R079 Chest pain, unspecified: Secondary | ICD-10-CM

## 2021-12-06 DIAGNOSIS — J209 Acute bronchitis, unspecified: Secondary | ICD-10-CM | POA: Diagnosis present

## 2021-12-06 DIAGNOSIS — M109 Gout, unspecified: Secondary | ICD-10-CM | POA: Diagnosis present

## 2021-12-06 DIAGNOSIS — Z20822 Contact with and (suspected) exposure to covid-19: Secondary | ICD-10-CM | POA: Diagnosis present

## 2021-12-06 DIAGNOSIS — E785 Hyperlipidemia, unspecified: Secondary | ICD-10-CM | POA: Diagnosis present

## 2021-12-06 DIAGNOSIS — A419 Sepsis, unspecified organism: Secondary | ICD-10-CM | POA: Diagnosis present

## 2021-12-06 DIAGNOSIS — R0902 Hypoxemia: Secondary | ICD-10-CM

## 2021-12-06 DIAGNOSIS — I10 Essential (primary) hypertension: Secondary | ICD-10-CM | POA: Diagnosis present

## 2021-12-06 DIAGNOSIS — D72828 Other elevated white blood cell count: Secondary | ICD-10-CM | POA: Diagnosis present

## 2021-12-06 LAB — CBC WITH DIFFERENTIAL/PLATELET
Abs Immature Granulocytes: 0.09 10*3/uL — ABNORMAL HIGH (ref 0.00–0.07)
Basophils Absolute: 0.1 10*3/uL (ref 0.0–0.1)
Basophils Relative: 0 %
Eosinophils Absolute: 0 10*3/uL (ref 0.0–0.5)
Eosinophils Relative: 0 %
HCT: 44 % (ref 39.0–52.0)
Hemoglobin: 14.6 g/dL (ref 13.0–17.0)
Immature Granulocytes: 1 %
Lymphocytes Relative: 6 %
Lymphs Abs: 1.1 10*3/uL (ref 0.7–4.0)
MCH: 31.7 pg (ref 26.0–34.0)
MCHC: 33.2 g/dL (ref 30.0–36.0)
MCV: 95.4 fL (ref 80.0–100.0)
Monocytes Absolute: 2.1 10*3/uL — ABNORMAL HIGH (ref 0.1–1.0)
Monocytes Relative: 11 %
Neutro Abs: 15.1 10*3/uL — ABNORMAL HIGH (ref 1.7–7.7)
Neutrophils Relative %: 82 %
Platelets: 384 10*3/uL (ref 150–400)
RBC: 4.61 MIL/uL (ref 4.22–5.81)
RDW: 12.3 % (ref 11.5–15.5)
WBC: 18.4 10*3/uL — ABNORMAL HIGH (ref 4.0–10.5)
nRBC: 0 % (ref 0.0–0.2)

## 2021-12-06 LAB — COMPREHENSIVE METABOLIC PANEL
ALT: 19 U/L (ref 0–44)
AST: 20 U/L (ref 15–41)
Albumin: 3.4 g/dL — ABNORMAL LOW (ref 3.5–5.0)
Alkaline Phosphatase: 110 U/L (ref 38–126)
Anion gap: 10 (ref 5–15)
BUN: 11 mg/dL (ref 8–23)
CO2: 22 mmol/L (ref 22–32)
Calcium: 9.2 mg/dL (ref 8.9–10.3)
Chloride: 101 mmol/L (ref 98–111)
Creatinine, Ser: 1.11 mg/dL (ref 0.61–1.24)
GFR, Estimated: >60 mL/min (ref >60)
Glucose, Bld: 120 mg/dL — ABNORMAL HIGH (ref 70–99)
Potassium: 3.5 mmol/L (ref 3.5–5.1)
Sodium: 133 mmol/L — ABNORMAL LOW (ref 135–145)
Total Bilirubin: 1.1 mg/dL (ref 0.3–1.2)
Total Protein: 7.3 g/dL (ref 6.5–8.1)

## 2021-12-06 LAB — URINALYSIS, ROUTINE W REFLEX MICROSCOPIC
Glucose, UA: NEGATIVE mg/dL
Hgb urine dipstick: NEGATIVE
Ketones, ur: NEGATIVE mg/dL
Leukocytes,Ua: NEGATIVE
Nitrite: NEGATIVE
Protein, ur: NEGATIVE mg/dL
Specific Gravity, Urine: 1.01 (ref 1.005–1.030)
pH: 6.5 (ref 5.0–8.0)

## 2021-12-06 LAB — APTT: aPTT: 24 seconds (ref 24–36)

## 2021-12-06 LAB — TROPONIN I (HIGH SENSITIVITY)
Troponin I (High Sensitivity): 14 ng/L (ref <18)
Troponin I (High Sensitivity): 17 ng/L (ref <18)

## 2021-12-06 LAB — PROTIME-INR
INR: 1.1 (ref 0.8–1.2)
Prothrombin Time: 13.7 seconds (ref 11.4–15.2)

## 2021-12-06 LAB — RESP PANEL BY RT-PCR (FLU A&B, COVID) ARPGX2
Influenza A by PCR: NEGATIVE
Influenza B by PCR: NEGATIVE
SARS Coronavirus 2 by RT PCR: NEGATIVE

## 2021-12-06 LAB — STREP PNEUMONIAE URINARY ANTIGEN: Strep Pneumo Urinary Antigen: NEGATIVE

## 2021-12-06 LAB — LACTIC ACID, PLASMA: Lactic Acid, Venous: 1.2 mmol/L (ref 0.5–1.9)

## 2021-12-06 LAB — D-DIMER, QUANTITATIVE: D-Dimer, Quant: 1.68 ug/mL-FEU — ABNORMAL HIGH (ref 0.00–0.50)

## 2021-12-06 LAB — BRAIN NATRIURETIC PEPTIDE: B Natriuretic Peptide: 27.5 pg/mL (ref 0.0–100.0)

## 2021-12-06 IMAGING — CT CT CHEST-ABD-PELV W/ CM
2 of 4 series · 15 of 46 positions shown, 17 images · IV contrast (omnipaque)
Comparison: [DATE] CT angio chest. Lumbar spine series
[DATE].

CLINICAL DATA: Sepsis, right upper quadrant pain, right chest pain

EXAM:
CT CHEST, ABDOMEN, AND PELVIS WITH CONTRAST
TECHNIQUE: Multidetector CT imaging of the chest, abdomen and pelvis was
performed following the standard protocol during bolus
administration of intravenous contrast.
CONTRAST:  100mL OMNIPAQUE IOHEXOL 350 MG/ML SOLN

[Series 3: cap with · axial · 0.81mm/px · z∈[-699,-99]mm · 12 of 140 slices shown, 14 images]
[im 10/140  soft-tissue]
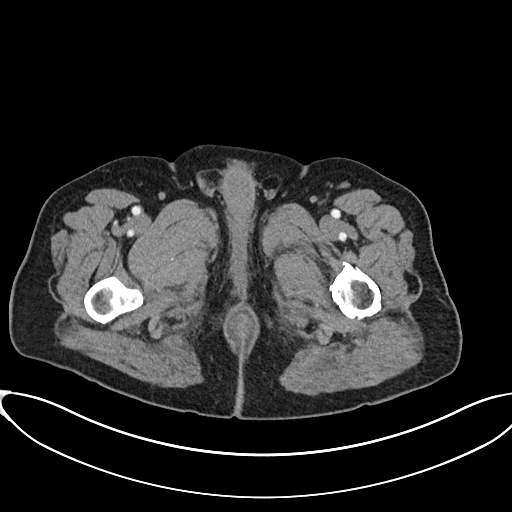
[im 10/140  bone]
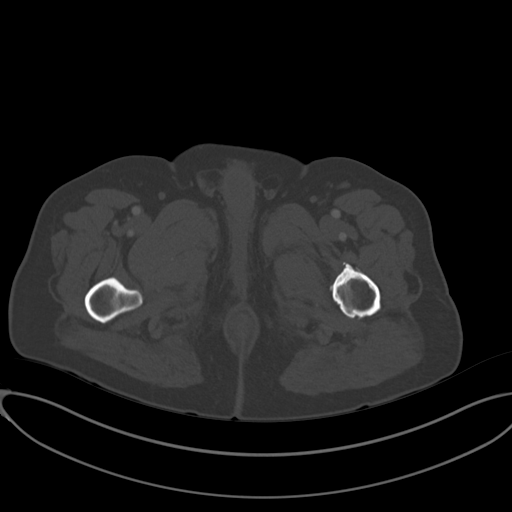
[im 20/140  soft-tissue]
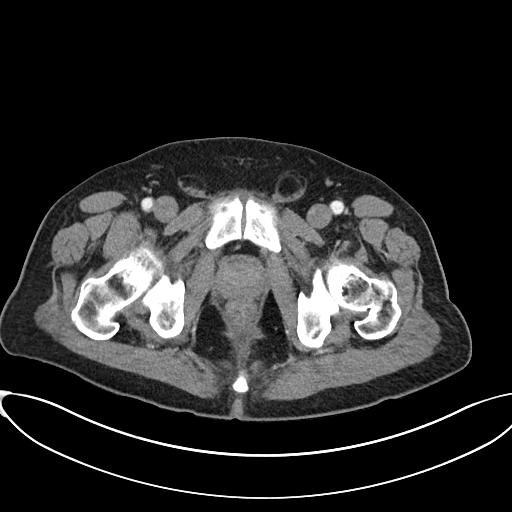
[im 30/140  soft-tissue]
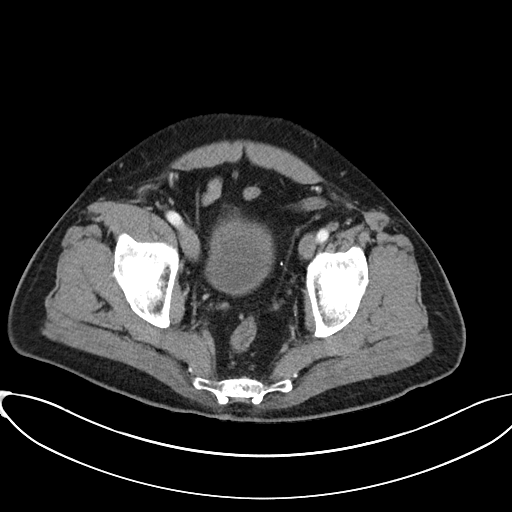
[im 40/140  soft-tissue]
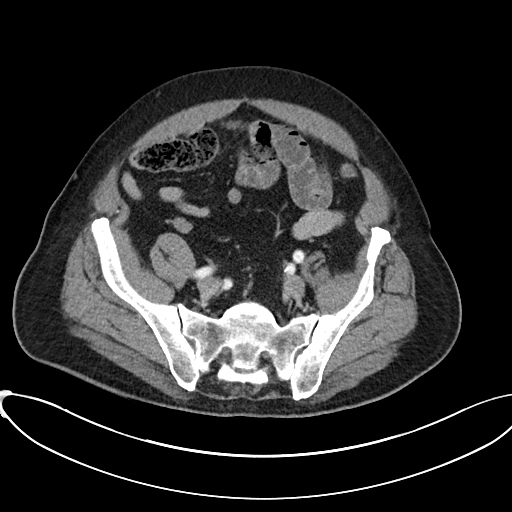
[im 50/140  soft-tissue]
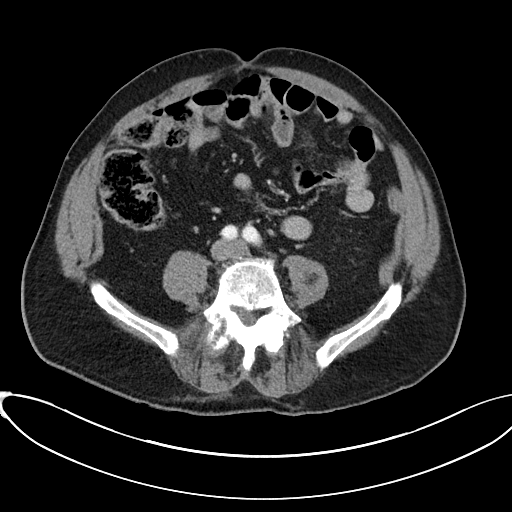
[im 60/140  soft-tissue]
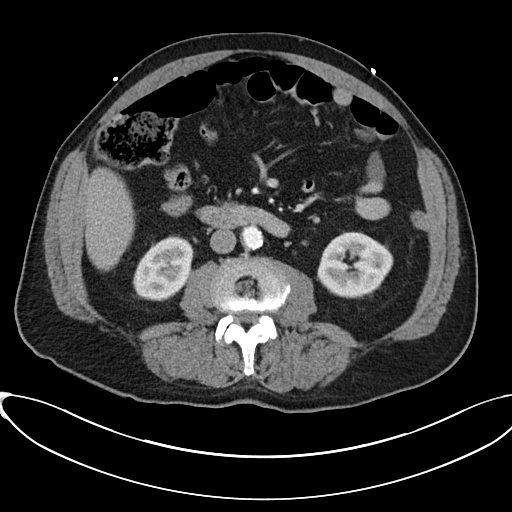
[im 80/140  soft-tissue]
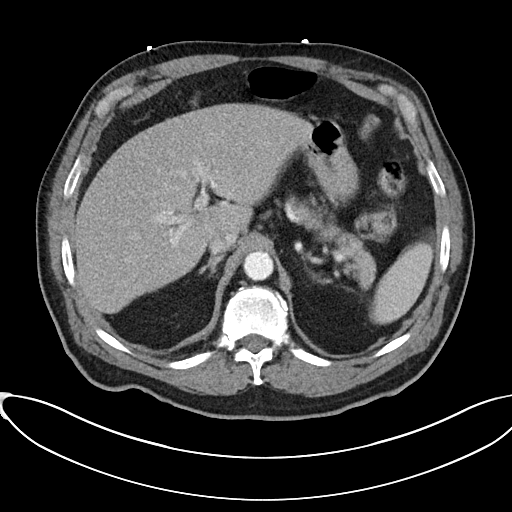
[im 90/140  soft-tissue]
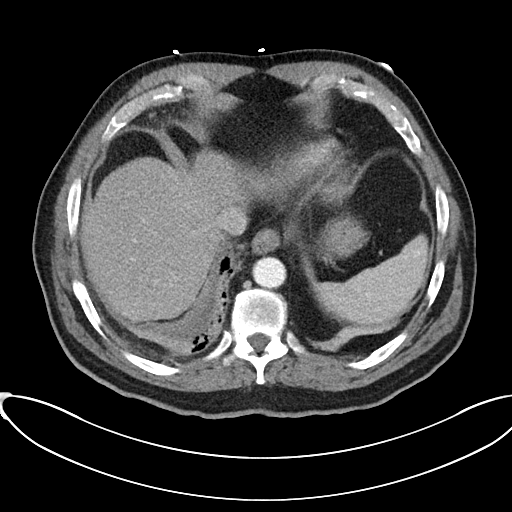
[im 100/140  soft-tissue]
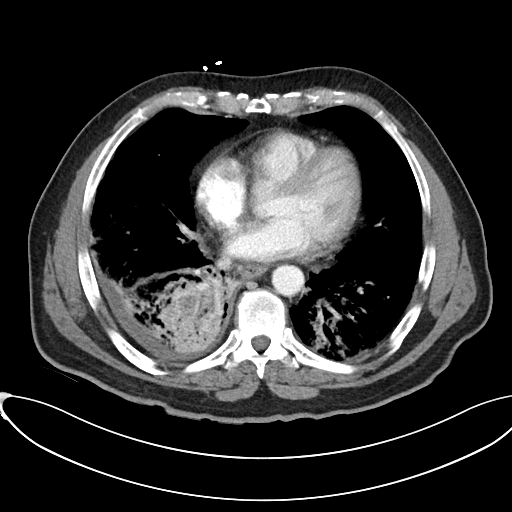
[im 100/140  bone]
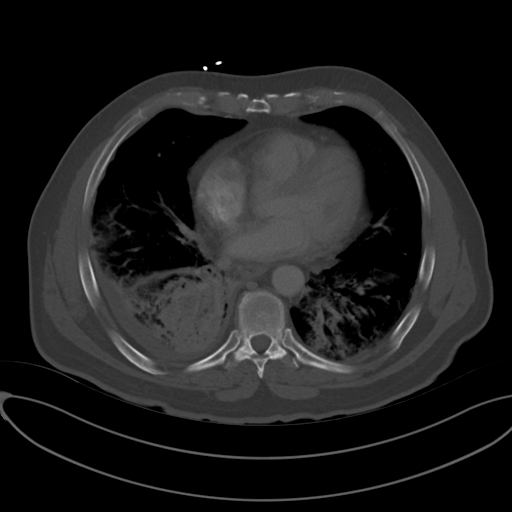
[im 110/140  soft-tissue]
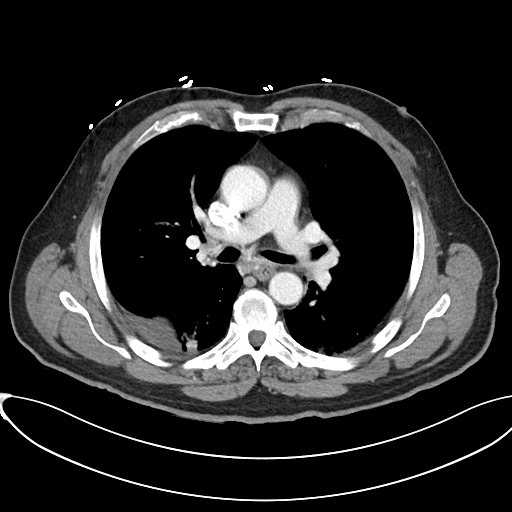
[im 120/140  soft-tissue]
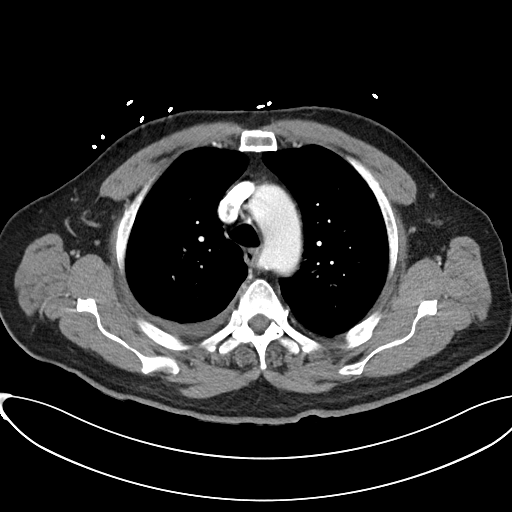
[im 130/140  soft-tissue]
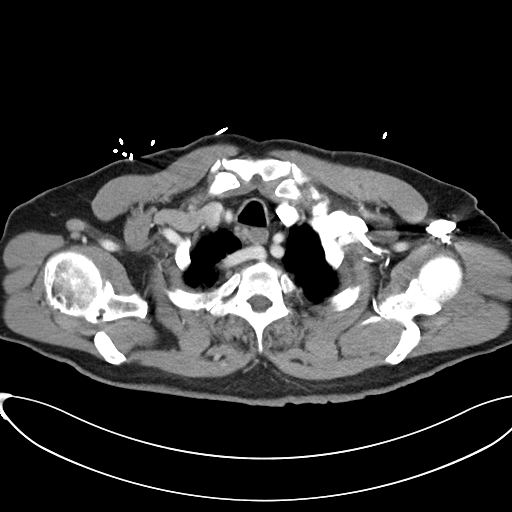

[Series 6: cor · coronal · 0.91mm/px · 3 of 113 slices shown]
[im 38/113  soft-tissue]
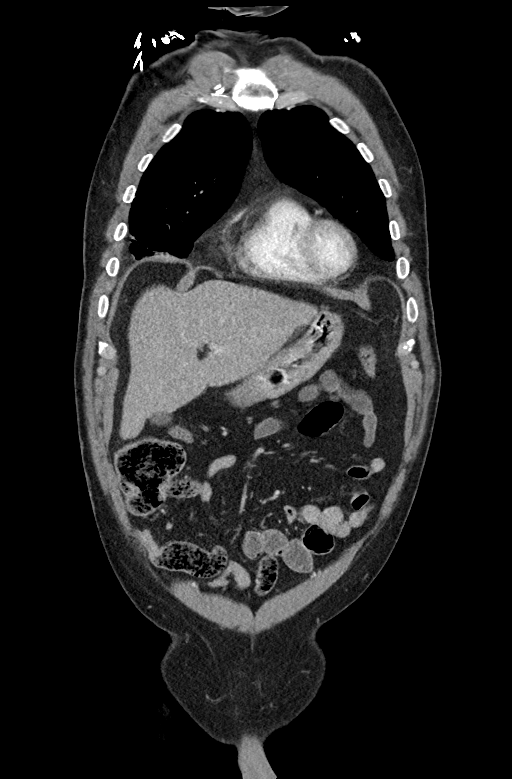
[im 50/113  soft-tissue]
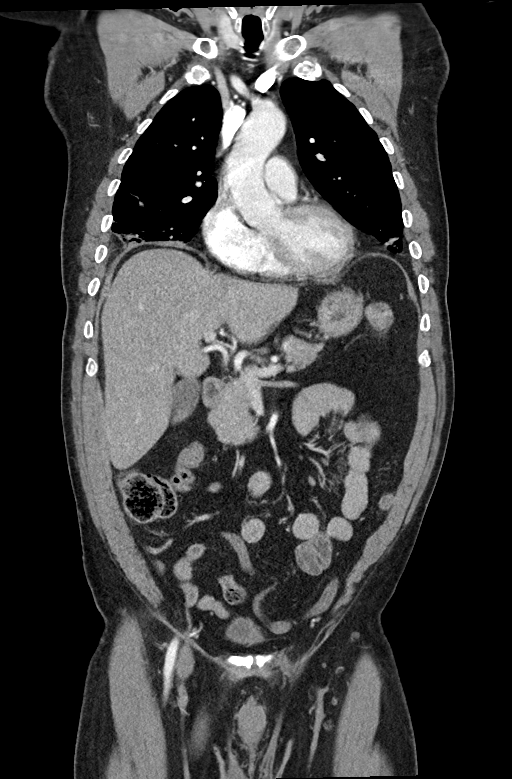
[im 63/113  soft-tissue]
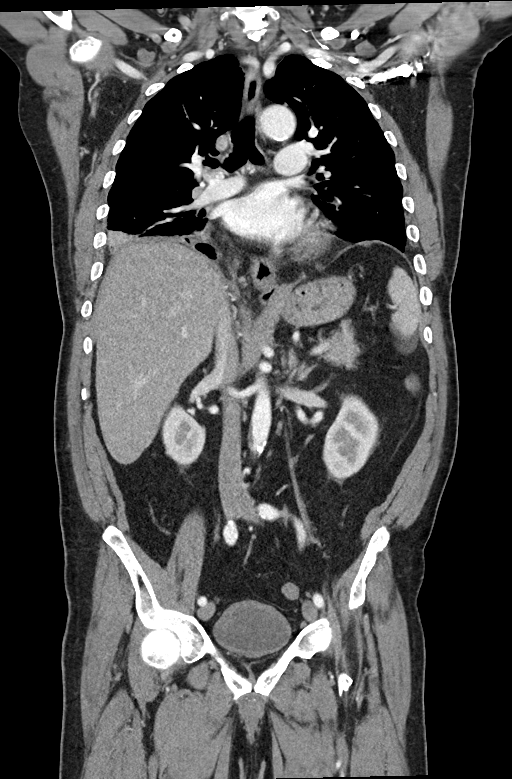

[15 of 46 positions shown; findings below may reference images not displayed]

FINDINGS: CT CHEST FINDINGS

Cardiovascular: Retroesophageal right subclavian artery. Scattered
aortic calcifications. No aneurysm. Heart is normal size.

Mediastinum/Nodes: No mediastinal, hilar, or axillary adenopathy.
Trachea and esophagus are unremarkable. Thyroid unremarkable.

Lungs/Pleura: Mucous plugging in the lower lobe airways bilaterally.
Areas of consolidation noted in both lower lobes, right greater than
left as well as the right middle lobe and lingula concerning for
pneumonia. Trace right pleural effusion.

Musculoskeletal: Chest wall soft tissues are unremarkable. No acute
bony abnormality

CT ABDOMEN PELVIS FINDINGS

Hepatobiliary: Small layering gallstones within the gallbladder. No
focal hepatic abnormality.

Pancreas: No focal abnormality or ductal dilatation.

Spleen: No focal abnormality.  Normal size.

Adrenals/Urinary Tract: No adrenal abnormality. No focal renal
abnormality. No stones or hydronephrosis. Urinary bladder is
unremarkable.

Stomach/Bowel: Normal appendix. Stomach, large and small bowel
grossly unremarkable.

Vascular/Lymphatic: Aortic atherosclerosis. No evidence of aneurysm
or adenopathy.

Reproductive: No visible focal abnormality.

Other: No free fluid or free air. Small bilateral inguinal hernias
containing fat.

Musculoskeletal: No acute bony abnormality. Chronic L1 compression
fracture, stable since prior lumbar spine series [DATE].
IMPRESSION: Mucous plugging with airspace disease in both lower lobes, right
worse than left as well as in the right middle lobe and lingula.
Findings concerning for pneumonia.

Trace right pleural effusion.

Cholelithiasis.  No CT evidence of acute cholecystitis.

Aortic atherosclerosis.

## 2021-12-06 MED ORDER — SODIUM CHLORIDE 0.9 % IV SOLN
500.0000 mg | INTRAVENOUS | Status: DC
  Administered 2021-12-06: 500 mg via INTRAVENOUS
  Filled 2021-12-06: qty 5

## 2021-12-06 MED ORDER — ACETAMINOPHEN 650 MG RE SUPP: 650.0000 mg | Freq: Four times a day (QID) | RECTAL | Status: DC | PRN

## 2021-12-06 MED ORDER — GUAIFENESIN ER 600 MG PO TB12
600.0000 mg | ORAL_TABLET | Freq: Two times a day (BID) | ORAL | Status: DC | PRN
  Administered 2021-12-06 – 2021-12-14 (×11): 600 mg via ORAL
  Filled 2021-12-06 (×11): qty 1

## 2021-12-06 MED ORDER — ATORVASTATIN CALCIUM 10 MG PO TABS
20.0000 mg | ORAL_TABLET | Freq: Every day | ORAL | Status: DC
  Administered 2021-12-06 – 2021-12-14 (×9): 20 mg via ORAL
  Filled 2021-12-06 (×9): qty 2

## 2021-12-06 MED ORDER — PANTOPRAZOLE SODIUM 40 MG PO TBEC
40.0000 mg | DELAYED_RELEASE_TABLET | Freq: Every day | ORAL | Status: DC
  Administered 2021-12-07 – 2021-12-14 (×8): 40 mg via ORAL
  Filled 2021-12-06 (×8): qty 1

## 2021-12-06 MED ORDER — LACTATED RINGERS IV SOLN: INTRAVENOUS | Status: AC

## 2021-12-06 MED ORDER — ALBUTEROL SULFATE (2.5 MG/3ML) 0.083% IN NEBU
3.0000 mL | INHALATION_SOLUTION | Freq: Four times a day (QID) | RESPIRATORY_TRACT | Status: DC | PRN
  Administered 2021-12-07 – 2021-12-10 (×7): 3 mL via RESPIRATORY_TRACT
  Filled 2021-12-06 (×7): qty 3

## 2021-12-06 MED ORDER — LACTATED RINGERS IV BOLUS (SEPSIS)
1000.0000 mL | Freq: Once | INTRAVENOUS | Status: AC
  Administered 2021-12-06: 1000 mL via INTRAVENOUS

## 2021-12-06 MED ORDER — OXYCODONE HCL 5 MG PO TABS
5.0000 mg | ORAL_TABLET | Freq: Four times a day (QID) | ORAL | Status: DC | PRN
  Administered 2021-12-06 – 2021-12-14 (×28): 5 mg via ORAL
  Filled 2021-12-06 (×28): qty 1

## 2021-12-06 MED ORDER — KETOROLAC TROMETHAMINE 30 MG/ML IJ SOLN
30.0000 mg | Freq: Once | INTRAMUSCULAR | Status: AC
  Administered 2021-12-06: 30 mg via INTRAVENOUS
  Filled 2021-12-06: qty 1

## 2021-12-06 MED ORDER — SODIUM CHLORIDE 0.9 % IV SOLN
2.0000 g | Freq: Once | INTRAVENOUS | Status: AC
  Administered 2021-12-06: 2 g via INTRAVENOUS
  Filled 2021-12-06: qty 2

## 2021-12-06 MED ORDER — SODIUM CHLORIDE 0.9 % IV SOLN
1.0000 g | INTRAVENOUS | Status: AC
  Administered 2021-12-06 – 2021-12-12 (×7): 1 g via INTRAVENOUS
  Filled 2021-12-06 (×7): qty 10

## 2021-12-06 MED ORDER — FEBUXOSTAT 40 MG PO TABS
40.0000 mg | ORAL_TABLET | Freq: Every day | ORAL | Status: DC
  Administered 2021-12-06 – 2021-12-14 (×9): 40 mg via ORAL
  Filled 2021-12-06 (×9): qty 1

## 2021-12-06 MED ORDER — ENOXAPARIN SODIUM 40 MG/0.4ML IJ SOSY
40.0000 mg | PREFILLED_SYRINGE | INTRAMUSCULAR | Status: DC
  Administered 2021-12-06 – 2021-12-13 (×8): 40 mg via SUBCUTANEOUS
  Filled 2021-12-06 (×8): qty 0.4

## 2021-12-06 MED ORDER — ACETAMINOPHEN 325 MG PO TABS
650.0000 mg | ORAL_TABLET | Freq: Four times a day (QID) | ORAL | Status: DC | PRN
  Administered 2021-12-07 – 2021-12-09 (×7): 650 mg via ORAL
  Filled 2021-12-06 (×8): qty 2

## 2021-12-06 MED ORDER — ONDANSETRON HCL 4 MG/2ML IJ SOLN
4.0000 mg | Freq: Once | INTRAMUSCULAR | Status: AC
  Administered 2021-12-06: 4 mg via INTRAVENOUS
  Filled 2021-12-06: qty 2

## 2021-12-06 MED ORDER — LACTATED RINGERS IV SOLN: INTRAVENOUS | Status: DC

## 2021-12-06 MED ORDER — VANCOMYCIN HCL 2000 MG/400ML IV SOLN
2000.0000 mg | Freq: Once | INTRAVENOUS | Status: AC
  Administered 2021-12-06: 2000 mg via INTRAVENOUS
  Filled 2021-12-06 (×2): qty 400

## 2021-12-06 MED ORDER — MORPHINE SULFATE (PF) 4 MG/ML IV SOLN
4.0000 mg | Freq: Once | INTRAVENOUS | Status: AC
  Administered 2021-12-06: 4 mg via INTRAVENOUS
  Filled 2021-12-06: qty 1

## 2021-12-06 MED ORDER — ACETAMINOPHEN 325 MG PO TABS
650.0000 mg | ORAL_TABLET | Freq: Once | ORAL | Status: AC
  Administered 2021-12-06: 650 mg via ORAL
  Filled 2021-12-06: qty 2

## 2021-12-06 MED ORDER — METRONIDAZOLE 500 MG/100ML IV SOLN
500.0000 mg | Freq: Once | INTRAVENOUS | Status: AC
  Administered 2021-12-06: 500 mg via INTRAVENOUS
  Filled 2021-12-06: qty 100

## 2021-12-06 MED ORDER — ENOXAPARIN SODIUM 40 MG/0.4ML IJ SOSY: 40.0000 mg | PREFILLED_SYRINGE | INTRAMUSCULAR | Status: DC

## 2021-12-06 MED ORDER — IOHEXOL 350 MG/ML SOLN
100.0000 mL | Freq: Once | INTRAVENOUS | Status: AC | PRN
  Administered 2021-12-06: 100 mL via INTRAVENOUS

## 2021-12-06 NOTE — ED Notes (Signed)
Eda Keys fiance [phone] requesting an update on the patient

## 2021-12-06 NOTE — ED Provider Notes (Signed)
[facility] EMERGENCY DEPARTMENT Provider Note   CSN: [id number] Arrival date & time: 12/06/21  3383     History Chief Complaint  Patient presents with   Shortness of Breath   Blood Infection   Chest Pain    Rodney Foster is a 71 y.o. male.  This is a 71 y.o. male with significant medical history as below, including hld, htn, OA who presents to the ED with complaint of chest pain, fever, dib. Pt with around 1 month of cough, chest pain, dyspnea, arthralgias. Worsened in last 4-5 days. Pt reports his CPAP machine at home was "dirty" and he developed pleurisy, he went to UC and was given "a chinese z-pack" which he completed. Felt as though this made his symptoms worse, worsening productive cough with green phlegm, right sided chest pain and RUQ abdominal pain. No nausea, vomiting, change in bowel/bladder function, no rashes, no IVDU. He does report feeling hot and sweaty last day or so. Pt presented to the ED last night but left AMA 2/2 long wait time.   The history is provided by the patient. No language interpreter was used.  Shortness of Breath Associated symptoms: abdominal pain, chest pain, cough and fever   Associated symptoms: no ear pain, no rash, no sore throat and no vomiting   Chest Pain Associated symptoms: abdominal pain, cough, fatigue, fever and shortness of breath   Associated symptoms: no back pain, no palpitations and no vomiting       Past Medical History:  Diagnosis Date   Backache, unspecified    Bronchitis    Cough variant asthma 05/03/2017   Genital herpes    Gout    Hearing loss    Hemorrhoids    Hernia    Herpes simplex    Hyperglycemia    Hyperlipidemia    Hypertension    Impotence    Insomnia, unspecified    Osteoarthrosis, unspecified whether generalized or localized, unspecified site    Tinea pedis, right    Unspecified disorder of male genital organs     Patient Active Problem List   Diagnosis Date Noted   Primary  localized osteoarthritis of left knee 05/06/2020   Primary localized osteoarthritis of right knee 09/25/2019   Cough variant asthma 05/03/2017   Abnormal CXR 05/03/2017   Sinus congestion 04/10/2017   Chronic bronchitis ([place]) 04/10/2017   PVC (premature ventricular contraction) 10/10/2016   Obese 03/28/2016   Pain in joint, ankle and foot 06/01/2015   Tinea unguium 03/15/2015   Vitreous floater 03/15/2015   Herpes simplex    Hyperlipidemia    Gout    Hypertension    Hyperglycemia    Insomnia    Impotence     Past Surgical History:  Procedure Laterality Date   fracture left femur  1984   ganglion repair of left forarm  2000   IVP  49   ML old comp fracture   KNEE SURGERY Right 01/21/2019   [place]   TONSILLECTOMY     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   Procedure: TOTAL KNEE ARTHROPLASTY;  Surgeon: Earlie Server, MD;  Location: [facility];  Service: Orthopedics;  Laterality: Right;   TOTAL KNEE ARTHROPLASTY Left 05/06/2020   Procedure: TOTAL KNEE ARTHROPLASTY;  Surgeon: Earlie Server, MD;  Location: [facility];  Service: Orthopedics;  Laterality: Left;   UMBILICAL HERNIA REPAIR  2002       Family  History  Problem Relation Age of Onset   COPD Mother    Aortic aneurysm Father     Social History   Tobacco Use   Smoking status: Former    Packs/day: 1.00    Years: 15.00    Pack years: 15.00    Types: Cigarettes    Quit date: 01/01/1984    Years since quitting: 37.9   Smokeless tobacco: Former    Quit date: 1990  Vaping Use   Vaping Use: Never used  Substance Use Topics   Alcohol use: Yes    Alcohol/week: 6.0 - 7.0 standard drinks    Types: 6 - 7 Glasses of wine per week    Comment: 3-4 a night of wine or cocktail   Drug use: No    Home Medications Prior to Admission medications   Medication Sig Start Date End Date Taking? Authorizing Provider  acyclovir (ZOVIRAX) 400 MG tablet TAKE 1 TABLET BY  MOUTH EVERY DAY Patient taking differently: Take 400 mg by mouth daily. 08/25/21  Yes [name], NP  amLODipine (NORVASC) 5 MG tablet TAKE ONE TABLET BY MOUTH ONCE DAILY TO CONTROL BLOOD PRESSURE Patient taking differently: Take 5 mg by mouth daily. to control blood pressure 08/25/21  Yes [name], NP  aspirin 325 MG tablet Take 650 mg by mouth every 6 (six) hours as needed for mild pain or headache.   Yes [provider]  atorvastatin (LIPITOR) 20 MG tablet TAKE 1 TABLET BY MOUTH EVERYDAY AT BEDTIME Patient taking differently: Take 20 mg by mouth daily. 10/30/21  Yes [name], NP  febuxostat (ULORIC) 40 MG tablet Take 1 tablet (40 mg total) by mouth daily. 03/09/20  Yes [name], NP  gabapentin (NEURONTIN) 100 MG capsule Take 1 capsule (100 mg total) by mouth 2 (two) times daily. Patient taking differently: Take 100 mg by mouth 2 (two) times daily as needed (pain). 11/17/21  Yes Medina-Vargas, Monina C, NP  HYDROcodone-acetaminophen (NORCO/VICODIN) 5-325 MG tablet Take 1 tablet by mouth every 8 (eight) hours as needed for moderate pain. 11/14/21  Yes [provider]  Multiple Vitamins-Minerals (ANTIOXIDANT PO) Take 30 mLs by mouth daily. Aronia Berry Juice   Yes [provider]  omeprazole (PRILOSEC) 20 MG capsule Take 20 mg by mouth daily.   Yes [provider]  tadalafil (CIALIS) 20 MG tablet Take 1 tablet (20 mg total) by mouth daily as needed for erectile dysfunction. 04/06/21  Yes [name], NP  tiZANidine (ZANAFLEX) 4 MG tablet Take 4 mg by mouth every 8 (eight) hours as needed for muscle spasms. 11/14/21  Yes [provider]  zolpidem (AMBIEN) 5 MG tablet Take one tablet by mouth at bedtime as needed for sleep Patient taking differently: Take 5 mg by mouth at bedtime as needed for sleep. 11/17/21  Yes Medina-Vargas, Monina C, NP  acetaminophen (TYLENOL) 500 MG tablet Take 1 tablet (500 mg total) by mouth  every 8 (eight) hours as needed for fever or moderate pain. Patient not taking: Reported on 12/06/2021 11/06/21   Ngetich, Dinah C, NP  guaiFENesin (MUCINEX) 600 MG 12 hr tablet Take 600-1,200 mg by mouth at bedtime as needed for cough or to loosen phlegm.    [provider]  losartan (COZAAR) 100 MG tablet TAKE ONE BY MOUTH DAILY FOR BLOOD PRESSURE CONTROL Patient taking differently: Take 100 mg by mouth daily. BLOOD PRESSURE CONTROL 11/03/21   [name], NP  methocarbamol (ROBAXIN) 500 MG  tablet Take 1 tablet (500 mg total) by mouth 3 (three) times daily as needed for muscle spasms. Patient not taking: Reported on 12/06/2021 11/10/21   Yvonna Alanis, NP    Allergies    Patient has no known allergies.  Review of Systems   Review of Systems  Constitutional:  Positive for chills, fatigue and fever.  HENT:  Negative for ear pain and sore throat.   Eyes:  Negative for pain and visual disturbance.  Respiratory:  Positive for cough and shortness of breath.   Cardiovascular:  Positive for chest pain. Negative for palpitations.  Gastrointestinal:  Positive for abdominal pain. Negative for vomiting.  Genitourinary:  Negative for dysuria and hematuria.  Musculoskeletal:  Positive for arthralgias. Negative for back pain.  Skin:  Negative for color change and rash.  Neurological:  Negative for seizures and syncope.  All other systems reviewed and are negative.  Physical Exam Updated Vital Signs BP 127/85   Pulse 97   Temp 98.1 F (36.7 C) (Oral)   Resp 20   SpO2 94%   Physical Exam Vitals and nursing note reviewed.  Constitutional:      General: He is in acute distress.     Appearance: He is well-developed. He is ill-appearing and diaphoretic.  HENT:     Head: Normocephalic and atraumatic.     Right Ear: External ear normal.     Left Ear: External ear normal.     Mouth/Throat:     Mouth: Mucous membranes are moist.  Eyes:     General: No scleral  icterus. Cardiovascular:     Rate and Rhythm: Regular rhythm. Tachycardia present.     Pulses: Normal pulses.     Heart sounds: Normal heart sounds.  Pulmonary:     Effort: Pulmonary effort is normal. No respiratory distress.     Breath sounds: Decreased breath sounds present.  Abdominal:     General: Abdomen is flat.     Palpations: Abdomen is soft.     Tenderness: There is abdominal tenderness.     Comments: Ruq ttp, no rebound, not peritoneal  Musculoskeletal:        General: Normal range of motion.     Cervical back: Normal range of motion.     Right lower leg: No edema.     Left lower leg: No edema.  Skin:    General: Skin is warm.     Capillary Refill: Capillary refill takes less than 2 seconds.  Neurological:     Mental Status: He is alert and oriented to person, place, and time.  Psychiatric:        Mood and Affect: Mood normal.        Behavior: Behavior normal.    ED Results / Procedures / Treatments   Labs (all labs ordered are listed, but only abnormal results are displayed) Labs Reviewed  COMPREHENSIVE METABOLIC PANEL - Abnormal; Notable for the following components:      Result Value   Sodium 133 (*)    Glucose, Bld 120 (*)    Albumin 3.4 (*)    All other components within normal limits  CBC WITH DIFFERENTIAL/PLATELET - Abnormal; Notable for the following components:   WBC 18.4 (*)    Neutro Abs 15.1 (*)    Monocytes Absolute 2.1 (*)    Abs Immature Granulocytes 0.09 (*)    All other components within normal limits  URINALYSIS, ROUTINE W REFLEX MICROSCOPIC - Abnormal; Notable for the following components:   Bilirubin  Urine SMALL (*)    All other components within normal limits  RESP PANEL BY RT-PCR (FLU A&B, COVID) ARPGX2  CULTURE, BLOOD (ROUTINE X 2)  CULTURE, BLOOD (ROUTINE X 2)  URINE CULTURE  LACTIC ACID, PLASMA  PROTIME-INR  APTT  BRAIN NATRIURETIC PEPTIDE  LEGIONELLA PNEUMOPHILA SEROGP 1 UR AG    EKG EKG  Interpretation  Date/Time:  Wednesday December 06 2021 08:32:55 EST Ventricular Rate:  146 PR Interval:  130 QRS Duration: 74 QT Interval:  266 QTC Calculation: 414 R Axis:   79 Text Interpretation: Sinus tachycardia with occasional Premature ventricular complexes Otherwise normal ECG Confirmed by Wynona Dove ([id number]) on 12/06/2021 12:52:08 PM  Radiology DG Chest 1 View  Result Date: 12/06/2021 CLINICAL DATA:  Right chest pain and shortness of breath EXAM: CHEST  1 VIEW COMPARISON:  12/05/2021 FINDINGS: Lower lung volumes with increased bibasilar atelectasis versus developing pneumonia or airspace process. Trace right effusion suspected. Normal heart size and vascularity. No edema pattern or CHF. Negative for pneumothorax. Trachea midline. Remote left posterior rib fractures evident. Degenerative changes of the right shoulder. IMPRESSION: Lower lung volumes with worsening bibasilar atelectasis versus developing pneumonia. Trace right pleural effusion. Electronically Signed   By: Jerilynn Mages.  Shick M.D.   On: 12/06/2021 10:47   DG Chest 2 View  Result Date: 12/05/2021 CLINICAL DATA:  Cough EXAM: CHEST - 2 VIEW COMPARISON:  Chest x-ray 05/03/2017, CT chest 02/09/2016 FINDINGS: The heart and mediastinal contours are unchanged. Biapical pleural/pulmonary scarring. Streaky airspace opacity at the right base likely atelectasis. No focal consolidation. No pulmonary edema. No pleural effusion. No pneumothorax. No acute osseous abnormality.  Old healed rib fractures. IMPRESSION: Streaky airspace opacities at the right base likely atelectasis. Electronically Signed   By: Iven Finn M.D.   On: 12/05/2021 19:44   CT CHEST ABDOMEN PELVIS W CONTRAST  Result Date: 12/06/2021 CLINICAL DATA:  Sepsis, right upper quadrant pain, right chest pain EXAM: CT CHEST, ABDOMEN, AND PELVIS WITH CONTRAST TECHNIQUE: Multidetector CT imaging of the chest, abdomen and pelvis was performed following the standard protocol during bolus  administration of intravenous contrast. CONTRAST:  12mL OMNIPAQUE IOHEXOL 350 MG/ML SOLN COMPARISON:  02/09/2016 CT angio chest. Lumbar spine series 07/08/2017. FINDINGS: CT CHEST FINDINGS Cardiovascular: Retroesophageal right subclavian artery. Scattered aortic calcifications. No aneurysm. Heart is normal size. Mediastinum/Nodes: No mediastinal, hilar, or axillary adenopathy. Trachea and esophagus are unremarkable. Thyroid unremarkable. Lungs/Pleura: Mucous plugging in the lower lobe airways bilaterally. Areas of consolidation noted in both lower lobes, right greater than left as well as the right middle lobe and lingula concerning for pneumonia. Trace right pleural effusion. Musculoskeletal: Chest wall soft tissues are unremarkable. No acute bony abnormality CT ABDOMEN PELVIS FINDINGS Hepatobiliary: Small layering gallstones within the gallbladder. No focal hepatic abnormality. Pancreas: No focal abnormality or ductal dilatation. Spleen: No focal abnormality.  Normal size. Adrenals/Urinary Tract: No adrenal abnormality. No focal renal abnormality. No stones or hydronephrosis. Urinary bladder is unremarkable. Stomach/Bowel: Normal appendix. Stomach, large and small bowel grossly unremarkable. Vascular/Lymphatic: Aortic atherosclerosis. No evidence of aneurysm or adenopathy. Reproductive: No visible focal abnormality. Other: No free fluid or free air. Small bilateral inguinal hernias containing fat. Musculoskeletal: No acute bony abnormality. Chronic L1 compression fracture, stable since prior lumbar spine series 07/08/2017. IMPRESSION: Mucous plugging with airspace disease in both lower lobes, right worse than left as well as in the right middle lobe and lingula. Findings concerning for pneumonia. Trace right pleural effusion. Cholelithiasis.  No CT evidence of acute cholecystitis. Aortic  atherosclerosis. Electronically Signed   By: Rolm Baptise M.D.   On: 12/06/2021 14:09   CT L-SPINE NO CHARGE  Result Date:  12/06/2021 CLINICAL DATA:  Shortness of breath, right upper quadrant pain. Right-sided chest pain. EXAM: CT LUMBAR SPINE WITHOUT CONTRAST TECHNIQUE: Multidetector CT imaging of the lumbar spine was performed without intravenous contrast administration. Multiplanar CT image reconstructions were also generated. COMPARISON:  Radiograph dated November 14, 2021 and MRI examination dated November 23,2022. FINDINGS: Segmentation: 5 lumbar type vertebrae. Alignment: Normal. Vertebrae: Chronic compression deformity of L1 vertebral body with approximately 40% anterior vertebral body height loss, unchanged. Paraspinal and other soft tissues: Negative. Disc levels: Multilevel DA and disc disease with disc space narrowing and marginal osteophytes prominent at L 3-L4, L4-L5 with associated facet joint arthropathy. Others: Incidentally imaged abdominal viscera demonstrate no acute abnormality. IMPRESSION: 1. Chronic compression deformity of the L1 vertebral body, unchanged. 2. Multilevel degenerative disc disease with disc space narrowing, marginal osteophyte and associated facet joint arthropathy. Electronically Signed   By: Keane Police D.O.   On: 12/06/2021 14:13    Procedures .Critical Care Performed by: Jeanell Sparrow, DO Authorized by: Jeanell Sparrow, DO   Critical care provider statement:    Critical care time (minutes):  49   Critical care time was exclusive of:  Separately billable procedures and treating other patients   Critical care was necessary to treat or prevent imminent or life-threatening deterioration of the following conditions:  Sepsis and respiratory failure   Critical care was time spent personally by me on the following activities:  Development of treatment plan with patient or surrogate, discussions with consultants, evaluation of patient's response to treatment, examination of patient, ordering and review of laboratory studies, ordering and review of radiographic studies, ordering and  performing treatments and interventions, pulse oximetry, re-evaluation of patient's condition and review of old charts   Care discussed with: admitting provider     Medications Ordered in ED Medications  lactated ringers infusion ( Intravenous New Bag/Given 12/06/21 1150)  lactated ringers bolus 1,000 mL (0 mLs Intravenous Stopped 12/06/21 1429)  ceFEPIme (MAXIPIME) 2 g in sodium chloride 0.9 % 100 mL IVPB (0 g Intravenous Stopped 12/06/21 1236)  metroNIDAZOLE (FLAGYL) IVPB 500 mg (0 mg Intravenous Stopped 12/06/21 1314)  vancomycin (VANCOREADY) IVPB 2000 mg/400 mL (0 mg Intravenous Stopped 12/06/21 1429)  acetaminophen (TYLENOL) tablet 650 mg (650 mg Oral Given 12/06/21 1159)  morphine 4 MG/ML injection 4 mg (4 mg Intravenous Given 12/06/21 1316)  ondansetron (ZOFRAN) injection 4 mg (4 mg Intravenous Given 12/06/21 1315)  iohexol (OMNIPAQUE) 350 MG/ML injection 100 mL (100 mLs Intravenous Contrast Given 12/06/21 1402)    ED Course  I have reviewed the triage vital signs and the nursing notes.  Pertinent labs & imaging results that were available during my care of the patient were reviewed by me and considered in my medical decision making (see chart for details).    MDM Rules/Calculators/A&P                           CC: cp, dib  This patient complains of above; this involves an extensive number of treatment options and is a complaint that carries with it a high risk of complications and morbidity. Vital signs were reviewed. Serious etiologies considered.  Pt hypoxic to 90% on room air, started on 2L [place] with pulse ox improved to 97-98%  Record review:  Previous records obtained and reviewed  Work up as above, notable for:  Labs & imaging results that were available during my care of the patient were reviewed by me and considered in my medical decision making.   I ordered imaging studies which included CXR, CT a/p chest, L spine and I independently visualized and interpreted imaging  which showed concerning for pneumonia  CBC with sig leukocytosis w/ shift, labs otherwise are stable.   He has some cholelithiasis on CT w/o acute cholecystitis, labs are stable. Would likely benefit from surg eval as outpatient if his pain persists.   Management: Was started on broad-spectrum antibiotics in triage, LR bolus and infusion.  Given analgesics, antipyretics.  Patient does report feeling better after fluids, medications.  Heart rate has improved following intervention.  Resp status improved on 2 L nasal cannula  Reassessment:  Patient with sepsis likely secondary to pneumonia.  Recommend admission at this time.  Patient is agreeable.  Discussed with hospitalist team accepts patient for admission.         This chart was dictated using voice recognition software.  Despite best efforts to proofread,  errors can occur which can change the documentation meaning.  Final Clinical Impression(s) / ED Diagnoses Final diagnoses:  Chest pain  Sepsis, due to unspecified organism, unspecified whether acute organ dysfunction present [facility])  Community acquired pneumonia, unspecified laterality  Hypoxia    Rx / DC Orders ED Discharge Orders     None        Jeanell Sparrow, DO 12/06/21 1514

## 2021-12-06 NOTE — Sepsis Progress Note (Signed)
eLink monitoring code sepsis.  

## 2021-12-06 NOTE — H&P (Addendum)
Date: 12/06/2021               Patient Name:  Rodney Foster MRN: [id number]  DOB: 01/09/1950 Age / Sex: 71 y.o., male   PCP: [name], NP         Medical Service: Internal Medicine Teaching Service         Attending Physician: Dr. Evette Doffing, Mallie Mussel, *    First Contact: Delene Ruffini, MD Pager: Lysbeth Penner [phone]  Second Contact: Iona Beard, MD Pager: Governor Rooks [phone]       After Hours (After 5p/  First Contact Pager: [phone]  weekends / holidays): Second Contact Pager: [phone]   SUBJECTIVE   Chief Complaint: Shortness of breath, cough  History of Present Illness:  Rodney Foster is a 71 year old M with a PMH of HTN, gout, HLD, chronic bronchitis, and OSA who is presenting after a month of shortness of breath and cough. He says that this all started after he used a "poisoned CPAP machine" which he attributes to letting the water sit in it without cleaning it out. He says that he went to the doctor a couple days after that and was given a zpack. It seemed to get better but then he started to get worse again and he describes being diagnosed with "chest pleurisy" that is a sharp stabbing pain in the right side of his chest often at night when he breaths. He followed with his primary care doctors in the next several weeks who gave him pain medication as well as some type of shot in his butt. During this period he really thought that he should be in the hospital but did not come to the ED until his primary care doctor drew labs and told him that they thought that he should go. He first went to the ED last night and waited from 7 PM to 11 PM but then said he really needed to get some sleep and signed out AMA. He came back this morning because he wasn't feeling any better. During this period he endorses night sweats and chills but no fevers that he knows of. Denies any abdominal pain, n/v/d, decreased appetite, dysuria, or unintentional weight loss.   Meds:  Current Meds  Medication Sig    acyclovir (ZOVIRAX) 400 MG tablet TAKE 1 TABLET BY MOUTH EVERY DAY (Patient taking differently: Take 400 mg by mouth daily.)   amLODipine (NORVASC) 5 MG tablet TAKE ONE TABLET BY MOUTH ONCE DAILY TO CONTROL BLOOD PRESSURE (Patient taking differently: Take 5 mg by mouth daily. to control blood pressure)   aspirin 325 MG tablet Take 650 mg by mouth every 6 (six) hours as needed for mild pain or headache.   atorvastatin (LIPITOR) 20 MG tablet TAKE 1 TABLET BY MOUTH EVERYDAY AT BEDTIME (Patient taking differently: Take 20 mg by mouth daily.)   febuxostat (ULORIC) 40 MG tablet Take 1 tablet (40 mg total) by mouth daily.   gabapentin (NEURONTIN) 100 MG capsule Take 1 capsule (100 mg total) by mouth 2 (two) times daily. (Patient taking differently: Take 100 mg by mouth 2 (two) times daily as needed (pain).)   HYDROcodone-acetaminophen (NORCO/VICODIN) 5-325 MG tablet Take 1 tablet by mouth every 8 (eight) hours as needed for moderate pain.   Multiple Vitamins-Minerals (ANTIOXIDANT PO) Take 30 mLs by mouth daily. Aronia Berry Juice   omeprazole (PRILOSEC) 20 MG capsule Take 20 mg by mouth daily.   tadalafil (CIALIS) 20 MG tablet Take 1 tablet (20 mg total)  by mouth daily as needed for erectile dysfunction.   tiZANidine (ZANAFLEX) 4 MG tablet Take 4 mg by mouth every 8 (eight) hours as needed for muscle spasms.   zolpidem (AMBIEN) 5 MG tablet Take one tablet by mouth at bedtime as needed for sleep (Patient taking differently: Take 5 mg by mouth at bedtime as needed for sleep.)    Past Medical History:  Diagnosis Date   Backache, unspecified    Bronchitis    Cough variant asthma 05/03/2017   Genital herpes    Gout    Hearing loss    Hemorrhoids    Hernia    Herpes simplex    Hyperglycemia    Hyperlipidemia    Hypertension    Impotence    Insomnia, unspecified    Osteoarthrosis, unspecified whether generalized or localized, unspecified site    Tinea pedis, right    Unspecified disorder of male  genital organs     Past Surgical History:  Procedure Laterality Date   fracture left femur  1984   ganglion repair of left forarm  2000   IVP  58   ML old comp fracture   KNEE SURGERY Right 01/21/2019   [place]   TONSILLECTOMY     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   Procedure: TOTAL KNEE ARTHROPLASTY;  Surgeon: Earlie Server, MD;  Location: [facility];  Service: Orthopedics;  Laterality: Right;   TOTAL KNEE ARTHROPLASTY Left 05/06/2020   Procedure: TOTAL KNEE ARTHROPLASTY;  Surgeon: Earlie Server, MD;  Location: [facility];  Service: Orthopedics;  Laterality: Left;   UMBILICAL HERNIA REPAIR  2002    Social:  Lives With:girlfriend Occupation: retired TEFL teacher Level of Function: able to complete ADLs PCP: Sherrie Mustache  Substances: 8-10 year pack history, quit 40 years ago; significant alcohol use, denies history of withdrawals, drinks multiple cocktails daily, sometimes 5-6 drinks a day.  Family History:  Negative for diabetes, lung disease, or lung cancer Heart attack in his father  Allergies: Allergies as of 12/06/2021   (No Known Allergies)    Review of Systems: A complete ROS was negative except as per HPI.   OBJECTIVE:   Physical Exam: Blood pressure 119/77, pulse 97, temperature 98.1 F (36.7 C), temperature source Oral, resp. rate (!) 22, SpO2 94 %.  Constitutional: Elderly gentleman resting in bed, in no acute distress Cardiovascular: tachycardic regular rhythm, no m/r/g Pulmonary/Chest: normal work on breathing on 2-3 L, speaking in full sentences, diminished breath sounds worse on the right, mucous rattling heard when speaking Abdominal: soft, non-tender, non-distended MSK: normal bulk and tone Neurological: alert & oriented x 3, 5/5 strength in bilateral upper and lower extremities Skin: warm and dry Psych: normal affect  Labs: CBC    Component Value Date/Time   WBC 18.4 (H) 12/06/2021  1109   RBC 4.61 12/06/2021 1109   HGB 14.6 12/06/2021 1109   HCT 44.0 12/06/2021 1109   PLT 384 12/06/2021 1109   MCV 95.4 12/06/2021 1109   MCH 31.7 12/06/2021 1109   MCHC 33.2 12/06/2021 1109   RDW 12.3 12/06/2021 1109   LYMPHSABS 1.1 12/06/2021 1109   MONOABS 2.1 (H) 12/06/2021 1109   EOSABS 0.0 12/06/2021 1109   BASOSABS 0.1 12/06/2021 1109     CMP     Component Value Date/Time   NA 133 (L) 12/06/2021 1109   NA 141 03/23/2016 0803   K 3.5 12/06/2021 1109   CL  101 12/06/2021 1109   CO2 22 12/06/2021 1109   GLUCOSE 120 (H) 12/06/2021 1109   BUN 11 12/06/2021 1109   BUN 11 03/23/2016 0803   CREATININE 1.11 12/06/2021 1109   CREATININE 0.79 10/09/2021 1340   CALCIUM 9.2 12/06/2021 1109   PROT 7.3 12/06/2021 1109   PROT 6.3 09/12/2015 0817   ALBUMIN 3.4 (L) 12/06/2021 1109   ALBUMIN 4.0 09/12/2015 0817   AST 20 12/06/2021 1109   ALT 19 12/06/2021 1109   ALKPHOS 110 12/06/2021 1109   BILITOT 1.1 12/06/2021 1109   BILITOT 0.4 09/12/2015 0817   GFRNONAA >60 12/06/2021 1109   GFRNONAA 87 10/24/2020 0824   GFRAA 101 10/24/2020 0824    Imaging: DG Chest 1 View  Result Date: 12/06/2021 CLINICAL DATA:  Right chest pain and shortness of breath EXAM: CHEST  1 VIEW COMPARISON:  12/05/2021 FINDINGS: Lower lung volumes with increased bibasilar atelectasis versus developing pneumonia or airspace process. Trace right effusion suspected. Normal heart size and vascularity. No edema pattern or CHF. Negative for pneumothorax. Trachea midline. Remote left posterior rib fractures evident. Degenerative changes of the right shoulder. IMPRESSION: Lower lung volumes with worsening bibasilar atelectasis versus developing pneumonia. Trace right pleural effusion. Electronically Signed   By: Jerilynn Mages.  Shick M.D.   On: 12/06/2021 10:47   DG Chest 2 View  Result Date: 12/05/2021 CLINICAL DATA:  Cough EXAM: CHEST - 2 VIEW COMPARISON:  Chest x-ray 05/03/2017, CT chest 02/09/2016 FINDINGS: The heart and  mediastinal contours are unchanged. Biapical pleural/pulmonary scarring. Streaky airspace opacity at the right base likely atelectasis. No focal consolidation. No pulmonary edema. No pleural effusion. No pneumothorax. No acute osseous abnormality.  Old healed rib fractures. IMPRESSION: Streaky airspace opacities at the right base likely atelectasis. Electronically Signed   By: Iven Finn M.D.   On: 12/05/2021 19:44   CT CHEST ABDOMEN PELVIS W CONTRAST  Result Date: 12/06/2021 CLINICAL DATA:  Sepsis, right upper quadrant pain, right chest pain EXAM: CT CHEST, ABDOMEN, AND PELVIS WITH CONTRAST TECHNIQUE: Multidetector CT imaging of the chest, abdomen and pelvis was performed following the standard protocol during bolus administration of intravenous contrast. CONTRAST:  176mL OMNIPAQUE IOHEXOL 350 MG/ML SOLN COMPARISON:  02/09/2016 CT angio chest. Lumbar spine series 07/08/2017. FINDINGS: CT CHEST FINDINGS Cardiovascular: Retroesophageal right subclavian artery. Scattered aortic calcifications. No aneurysm. Heart is normal size. Mediastinum/Nodes: No mediastinal, hilar, or axillary adenopathy. Trachea and esophagus are unremarkable. Thyroid unremarkable. Lungs/Pleura: Mucous plugging in the lower lobe airways bilaterally. Areas of consolidation noted in both lower lobes, right greater than left as well as the right middle lobe and lingula concerning for pneumonia. Trace right pleural effusion. Musculoskeletal: Chest wall soft tissues are unremarkable. No acute bony abnormality CT ABDOMEN PELVIS FINDINGS Hepatobiliary: Small layering gallstones within the gallbladder. No focal hepatic abnormality. Pancreas: No focal abnormality or ductal dilatation. Spleen: No focal abnormality.  Normal size. Adrenals/Urinary Tract: No adrenal abnormality. No focal renal abnormality. No stones or hydronephrosis. Urinary bladder is unremarkable. Stomach/Bowel: Normal appendix. Stomach, large and small bowel grossly unremarkable.  Vascular/Lymphatic: Aortic atherosclerosis. No evidence of aneurysm or adenopathy. Reproductive: No visible focal abnormality. Other: No free fluid or free air. Small bilateral inguinal hernias containing fat. Musculoskeletal: No acute bony abnormality. Chronic L1 compression fracture, stable since prior lumbar spine series 07/08/2017. IMPRESSION: Mucous plugging with airspace disease in both lower lobes, right worse than left as well as in the right middle lobe and lingula. Findings concerning for pneumonia. Trace right pleural effusion. Cholelithiasis.  No CT evidence of acute cholecystitis. Aortic atherosclerosis. Electronically Signed   By: Rolm Baptise M.D.   On: 12/06/2021 14:09   CT L-SPINE NO CHARGE  Result Date: 12/06/2021 CLINICAL DATA:  Shortness of breath, right upper quadrant pain. Right-sided chest pain. EXAM: CT LUMBAR SPINE WITHOUT CONTRAST TECHNIQUE: Multidetector CT imaging of the lumbar spine was performed without intravenous contrast administration. Multiplanar CT image reconstructions were also generated. COMPARISON:  Radiograph dated November 14, 2021 and MRI examination dated November 23,2022. FINDINGS: Segmentation: 5 lumbar type vertebrae. Alignment: Normal. Vertebrae: Chronic compression deformity of L1 vertebral body with approximately 40% anterior vertebral body height loss, unchanged. Paraspinal and other soft tissues: Negative. Disc levels: Multilevel DA and disc disease with disc space narrowing and marginal osteophytes prominent at L 3-L4, L4-L5 with associated facet joint arthropathy. Others: Incidentally imaged abdominal viscera demonstrate no acute abnormality. IMPRESSION: 1. Chronic compression deformity of the L1 vertebral body, unchanged. 2. Multilevel degenerative disc disease with disc space narrowing, marginal osteophyte and associated facet joint arthropathy. Electronically Signed   By: Keane Police D.O.   On: 12/06/2021 14:13    EKG: personally reviewed my  interpretation is sinus tachycardia normal axis normal interval   ASSESSMENT & PLAN:    Assessment & Plan by Problem:  JOVANI FLURY is a 71 y.o. with pertinent PMH of HTN, gout, HLD, chronic bronchitis, and OSA who is presenting after a month of shortness of breath and cough admitted for pneumonia.  #Acute acquired pneumonia, right lower lobe #Chronic bronchitis Patient is presenting with a month long history of shortness of breath, cough, mucous production, fatigue, and night sweats that have persisted despite outpatient treatment with zpack. CT chest showed mucous plugging and airspace disease concerning for pneumonia worse on the right than the left. Patient was also febrile to 102.4 and had a white count of 18.4. However no organ dysfunction at this time to suggest sepsis. Given patient's failure to improve after outpatient antibiotics will treat with broad spectrum antibiotics - wean oxygen as able - f/u strep pneumo antigen, legionella testing - Changed vancomycin and cefepime to ceftriaxone for at least 5 days - f/u blood cultures  #HLD - continue home atorvastatin 20  #HTN Patient normotensive on admission. - holding home amlodipine and losartan  #Concern for alcohol withdrawals Patient reports a significant intake of alcohol daily. -- CIWA   #Gout -continue home febuxostat  #GERD -continue home pantoprazole  #OSA - continue CPAP nightly  #Hyponatremia Low normal at 133 -daily bmp  #Aortic Atherosclerosis -seen on CT  Diet: Normal VTE: Enoxaparin IVF: None,None Code: Full  Dispo: Admit patient to Inpatient with expected length of stay greater than 2 midnights.  Signed: Scarlett Presto, MD Internal Medicine Resident PGY-1 Pager: [phone]  12/06/2021, 3:40 PM

## 2021-12-06 NOTE — ED Triage Notes (Signed)
Pt. Left AMA yesterday

## 2021-12-06 NOTE — ED Notes (Signed)
Patient transported to CT 

## 2021-12-06 NOTE — ED Triage Notes (Signed)
Pt. Stated, I got infection from a Bi-pap machine. Its gotten worse. Ive developed pleurisy and cant hardly breath

## 2021-12-07 ENCOUNTER — Inpatient Hospital Stay (HOSPITAL_COMMUNITY): Payer: Medicare HMO

## 2021-12-07 DIAGNOSIS — J189 Pneumonia, unspecified organism: Principal | ICD-10-CM

## 2021-12-07 LAB — CBC
HCT: 36.2 % — ABNORMAL LOW (ref 39.0–52.0)
Hemoglobin: 12.1 g/dL — ABNORMAL LOW (ref 13.0–17.0)
MCH: 33 pg (ref 26.0–34.0)
MCHC: 33.4 g/dL (ref 30.0–36.0)
MCV: 98.6 fL (ref 80.0–100.0)
Platelets: 310 10*3/uL (ref 150–400)
RBC: 3.67 MIL/uL — ABNORMAL LOW (ref 4.22–5.81)
RDW: 12.4 % (ref 11.5–15.5)
WBC: 18 10*3/uL — ABNORMAL HIGH (ref 4.0–10.5)
nRBC: 0 % (ref 0.0–0.2)

## 2021-12-07 LAB — BASIC METABOLIC PANEL
Anion gap: 8 (ref 5–15)
BUN: 7 mg/dL — ABNORMAL LOW (ref 8–23)
CO2: 23 mmol/L (ref 22–32)
Calcium: 8.4 mg/dL — ABNORMAL LOW (ref 8.9–10.3)
Chloride: 98 mmol/L (ref 98–111)
Creatinine, Ser: 0.79 mg/dL (ref 0.61–1.24)
GFR, Estimated: >60 mL/min (ref >60)
Glucose, Bld: 95 mg/dL (ref 70–99)
Potassium: 3.6 mmol/L (ref 3.5–5.1)
Sodium: 129 mmol/L — ABNORMAL LOW (ref 135–145)

## 2021-12-07 LAB — LEGIONELLA PNEUMOPHILA SEROGP 1 UR AG: L. pneumophila Serogp 1 Ur Ag: NEGATIVE

## 2021-12-07 LAB — URINE CULTURE: Culture: NO GROWTH

## 2021-12-07 MED ORDER — ENSURE ENLIVE PO LIQD
237.0000 mL | Freq: Two times a day (BID) | ORAL | Status: DC
  Administered 2021-12-07 – 2021-12-08 (×2): 237 mL via ORAL

## 2021-12-07 MED ORDER — IOHEXOL 350 MG/ML SOLN
80.0000 mL | Freq: Once | INTRAVENOUS | Status: AC | PRN
  Administered 2021-12-07: 80 mL via INTRAVENOUS

## 2021-12-07 MED ORDER — LACTATED RINGERS IV BOLUS (SEPSIS)
500.0000 mL | Freq: Once | INTRAVENOUS | Status: AC
  Administered 2021-12-07: 500 mL via INTRAVENOUS

## 2021-12-07 MED ORDER — LACTATED RINGERS IV SOLN: INTRAVENOUS | Status: AC

## 2021-12-07 MED ORDER — LACTATED RINGERS IV SOLN: INTRAVENOUS | Status: DC

## 2021-12-07 MED ORDER — ORAL CARE MOUTH RINSE
15.0000 mL | Freq: Two times a day (BID) | OROMUCOSAL | Status: DC
  Administered 2021-12-07 – 2021-12-14 (×13): 15 mL via OROMUCOSAL

## 2021-12-07 NOTE — Care Management (Signed)
71 yo Patient admitted for pneumonia, no needs identified at this time, if needed, place a TOC consult in for further needs.

## 2021-12-07 NOTE — Progress Notes (Signed)
HD#1 SUBJECTIVE:  Patient Summary: Rodney Foster is a 71 y.o. with a pertinent PMH of OSA on CPAP, bronchitis, HTN, HLD, who presented with progressive dyspnea and cough and admitted for CAP.   Overnight Events: no acute events overnight. He briefly required 4L [place].     Interm History: Patient states he feels as though his breathing has improved slightly today. Reporting continued chest pain related to prior diagnosis of pleurisy.   OBJECTIVE:  Vital Signs: Vitals:   12/07/21 1335 12/07/21 1340 12/07/21 1355 12/07/21 1433  BP:  (!) 148/70 (!) 166/77 134/81  Pulse:  (!) 116 (!) 124 (!) 112  Resp:  20 (!) 28 (!) 24  Temp: 98.9 F (37.2 C)  (!) 101.1 F (38.4 C) (!) 100.4 F (38 C)  TempSrc:   Oral Oral  SpO2:  95% 93% 93%  Weight:   91.1 kg   Height:   5\' 10"  (1.778 m)    Supplemental O2: Nasal Cannula SpO2: 93 % O2 Flow Rate (L/min): 2 L/min  Filed Weights   12/07/21 1355  Weight: 91.1 kg     Intake/Output Summary (Last 24 hours) at 12/07/2021 1616 Last data filed at 12/07/2021 1304 Gross per 24 hour  Intake 1343.96 ml  Output 2700 ml  Net -1356.04 ml   Net IO Since Admission: 238.67 mL [12/07/21 1616]  Physical Exam: Physical Exam  Patient Lines/Drains/Airways Status     Active Line/Drains/Airways     Name Placement date Placement time Site Days   Peripheral IV 12/06/21 20 G Right Antecubital 12/06/21  1139  Antecubital  1   Peripheral IV 12/06/21 20 G Left Arm 12/06/21  1156  Arm  1   Incision (Closed) 09/25/19 Knee Right 09/25/19  0907  -- 804   Incision (Closed) 05/06/20 Knee 05/06/20  0824  -- 580            Pertinent Labs: CBC Latest Ref Rng & Units 12/07/2021 12/06/2021 12/05/2021  WBC 4.0 - 10.5 K/uL 18.0(H) 18.4(H) 12.7(H)  Hemoglobin 13.0 - 17.0 g/dL 12.1(L) 14.6 13.4  Hematocrit 39.0 - 52.0 % 36.2(L) 44.0 40.6  Platelets 150 - 400 K/uL 310 384 384    CMP Latest Ref Rng & Units 12/07/2021 12/06/2021 12/05/2021  Glucose 70 - 99 mg/dL 95  120(H) 118(H)  BUN 8 - 23 mg/dL 7(L) 11 9  Creatinine 0.61 - 1.24 mg/dL 0.79 1.11 0.88  Sodium 135 - 145 mmol/L 129(L) 133(L) 132(L)  Potassium 3.5 - 5.1 mmol/L 3.6 3.5 3.9  Chloride 98 - 111 mmol/L 98 101 97(L)  CO2 22 - 32 mmol/L 23 22 24   Calcium 8.9 - 10.3 mg/dL 8.4(L) 9.2 8.8(L)  Total Protein 6.5 - 8.1 g/dL - 7.3 6.9  Total Bilirubin 0.3 - 1.2 mg/dL - 1.1 0.4  Alkaline Phos 38 - 126 U/L - 110 87  AST 15 - 41 U/L - 20 15  ALT 0 - 44 U/L - 19 17    No results for input(s): GLUCAP in the last 72 hours.   Pertinent Imaging: CT Angio Chest Pulmonary Embolism (PE) W or WO Contrast  Result Date: 12/07/2021 CLINICAL DATA:  Positive D-dimer, pulmonary embolism suspected. EXAM: CT ANGIOGRAPHY CHEST WITH CONTRAST TECHNIQUE: Multidetector CT imaging of the chest was performed using the standard protocol during bolus administration of intravenous contrast. Multiplanar CT image reconstructions and MIPs were obtained to evaluate the vascular anatomy. CONTRAST:  86mL OMNIPAQUE IOHEXOL 350 MG/ML SOLN COMPARISON:  12/06/2021. FINDINGS: Cardiovascular: The heart  is normal in size and there is no pericardial effusion. There is mild atherosclerotic calcification of the aorta without evidence of aneurysm. Aberrant origin of the right subclavian artery is noted which courses posterior to the esophagus. The pulmonary trunk is normal in caliber. No definite evidence of pulmonary artery filling defect. Evaluation of the segmental and subsegmental arteries is limited due to mixing artifact, respiratory motion, and infiltrates. Mediastinum/Nodes: Enlarged lymph nodes are present at the right hilum and there is bronchial wall thickening on the right. Shotty lymph nodes are noted in the mediastinum. No axillary lymphadenopathy is seen. The thyroid gland, trachea, and esophagus are within normal limits. A small hiatal hernia is noted. Lungs/Pleura: There is bronchial wall thickening on the right with narrowing of the  right lower lobe bronchus with right lower lobe consolidation. Mild consolidation is seen in the right middle lobe and lingular segment of the left upper lobe. A small to moderate right pleural effusion is seen. There is mild consolidation in the left lower lobe with areas of mucous plugging. No pneumothorax is seen bilaterally. Upper Abdomen: No acute abnormality. Musculoskeletal: No acute osseous abnormality. Review of the MIP images confirms the above findings. IMPRESSION: 1. No definite evidence of pulmonary embolism. Evaluation of the segmental and subsegmental arteries at the lung bases is limited due to mixing artifact, respiratory motion, and infiltrates. The need for repeat evaluation should be determined clinically. 2. Bilateral lower lobe consolidation and consolidation in the right middle lobe and lingular segment of the left upper lobe, increased from the prior exam and concerning for pneumonia. There is bronchial wall thickening and prominent lymph nodes in the hilar region on the right. Follow-up is recommended until resolution to exclude the possibility of underlying neoplasm. 3. Small to moderate right pleural effusion, increased in size from the prior exam. 4. Remaining chronic findings as described above. Electronically Signed   By: Brett Fairy M.D.   On: 12/07/2021 00:55    ASSESSMENT/PLAN:  Assessment: Principal Problem:   CAP (community acquired pneumonia) Active Problems:   Hypertension   Obese   Chronic bronchitis ([place])   Rodney Foster is a 71 y.o. with pertinent PMH of chronic bronchitis, OSA on CPAP, HTN, HLD, gout who presented with progressive dyspnea and cough and admit for community acquired pneumonia on hospital day 1  Plan: #community acquired pneumonia, complicated by para-pneumonic effusion  - He appears inflamed today, diaphoretic. Blood pressure stable although he remains tachycardic. Urine amber in color, fortunately renal function preserved, although he would  likely benefit from additional IV fluids. Will continue IVFs.  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  - Overnight CTA showed progression of infiltrate with pleural effusion increasing in size. Effusion likely not large enough to be tapped at this point, though if it continues to increase, can consider tapping. Will continue to monitor with daily ultrasounds.  - Will continue treating with ceftriaxone 1g Q24hr - Satting 94% on 2L [place]. Continue supplemental oxygen for now. Wean as tolerated.  - continue pain control for pleuritic CP.  #alcohol use disorder Significant daily alcohol intake, denies withdrawals. - Will continue to monitor for signs of withdrawal.  - can consider ativan if signs of withdrawal  OSA on CPAP - will continue nightly CPAP  HTN - blood pressure has remained stable  HLD - continue statin  Gout - monitor.  Jerrye Bushy - protonix  Best Practice: Diet: Regular diet IVF: Fluids:  LR VTE: enoxaparin (LOVENOX) injection 40 mg Start: 12/06/21 1800 Code: Full AB: ceftriaxone 1g Q24hr  DISPO: Anticipated discharge in 1-2 days to Home pending Medical stability.  Signature: Delene Ruffini, MD  Internal Medicine Resident, PGY-1 [facility]  Pager: [phone] 4:16 PM, 12/07/2021   Please contact the on call pager after 5 pm and on weekends at [phone].

## 2021-12-07 NOTE — ED Notes (Signed)
Epic Report given to Duane Boston, RN of [phone]

## 2021-12-07 NOTE — Progress Notes (Signed)
Patient complains of chest pain from coughing. Vital signs checked while patient was having pain 10/10. Patient assessed, medicated. Will recheck vital signs in 20 minutes for effect.

## 2021-12-08 DIAGNOSIS — J189 Pneumonia, unspecified organism: Secondary | ICD-10-CM | POA: Diagnosis not present

## 2021-12-08 LAB — EXPECTORATED SPUTUM ASSESSMENT W GRAM STAIN, RFLX TO RESP C

## 2021-12-08 LAB — CBC
HCT: 37 % — ABNORMAL LOW (ref 39.0–52.0)
Hemoglobin: 12.3 g/dL — ABNORMAL LOW (ref 13.0–17.0)
MCH: 32.3 pg (ref 26.0–34.0)
MCHC: 33.2 g/dL (ref 30.0–36.0)
MCV: 97.1 fL (ref 80.0–100.0)
Platelets: 308 10*3/uL (ref 150–400)
RBC: 3.81 MIL/uL — ABNORMAL LOW (ref 4.22–5.81)
RDW: 12.3 % (ref 11.5–15.5)
WBC: 20 10*3/uL — ABNORMAL HIGH (ref 4.0–10.5)
nRBC: 0 % (ref 0.0–0.2)

## 2021-12-08 LAB — COMPREHENSIVE METABOLIC PANEL
ALT: 14 U/L (ref 0–44)
AST: 14 U/L — ABNORMAL LOW (ref 15–41)
Albumin: 2.4 g/dL — ABNORMAL LOW (ref 3.5–5.0)
Alkaline Phosphatase: 89 U/L (ref 38–126)
Anion gap: 8 (ref 5–15)
BUN: <5 mg/dL — ABNORMAL LOW (ref 8–23)
CO2: 28 mmol/L (ref 22–32)
Calcium: 8.8 mg/dL — ABNORMAL LOW (ref 8.9–10.3)
Chloride: 97 mmol/L — ABNORMAL LOW (ref 98–111)
Creatinine, Ser: 0.78 mg/dL (ref 0.61–1.24)
GFR, Estimated: >60 mL/min (ref >60)
Glucose, Bld: 110 mg/dL — ABNORMAL HIGH (ref 70–99)
Potassium: 4 mmol/L (ref 3.5–5.1)
Sodium: 133 mmol/L — ABNORMAL LOW (ref 135–145)
Total Bilirubin: 0.9 mg/dL (ref 0.3–1.2)
Total Protein: 5.8 g/dL — ABNORMAL LOW (ref 6.5–8.1)

## 2021-12-08 LAB — HIV ANTIBODY (ROUTINE TESTING W REFLEX): HIV Screen 4th Generation wRfx: NONREACTIVE

## 2021-12-08 MED ORDER — KETOROLAC TROMETHAMINE 60 MG/2ML IM SOLN: 60.0000 mg | Freq: Once | INTRAMUSCULAR | Status: DC

## 2021-12-08 MED ORDER — LACTATED RINGERS IV SOLN: INTRAVENOUS | Status: DC

## 2021-12-08 MED ORDER — BOOST / RESOURCE BREEZE PO LIQD CUSTOM
1.0000 | Freq: Two times a day (BID) | ORAL | Status: DC
  Administered 2021-12-08 – 2021-12-14 (×12): 1 via ORAL

## 2021-12-08 MED ORDER — POLYETHYLENE GLYCOL 3350 17 G PO PACK
17.0000 g | PACK | Freq: Every day | ORAL | Status: DC | PRN
  Administered 2021-12-09 – 2021-12-10 (×2): 17 g via ORAL
  Filled 2021-12-08 (×2): qty 1

## 2021-12-08 MED ORDER — LACTATED RINGERS IV BOLUS
500.0000 mL | Freq: Once | INTRAVENOUS | Status: AC
Start: 2021-12-08 — End: 2021-12-08
  Administered 2021-12-08: 500 mL via INTRAVENOUS

## 2021-12-08 MED ORDER — KETOROLAC TROMETHAMINE 30 MG/ML IJ SOLN
30.0000 mg | Freq: Three times a day (TID) | INTRAMUSCULAR | Status: AC | PRN
  Administered 2021-12-08 – 2021-12-09 (×2): 30 mg via INTRAVENOUS
  Filled 2021-12-08 (×4): qty 1

## 2021-12-08 MED ORDER — LACTATED RINGERS IV SOLN: INTRAVENOUS | Status: AC

## 2021-12-08 NOTE — Progress Notes (Signed)
Initial Nutrition Assessment  DOCUMENTATION CODES:   Not applicable  INTERVENTION:   Recommend liberalizing pt diet to regular due to poor PO recently and weight loss. Received ok from MD. Encourage good PO intake Discontinue Ensure Enlive Boost Breeze po BID, each supplement provides 250 kcal and 9 grams of protein Multivitamin w/ minerals daily  NUTRITION DIAGNOSIS:   Increased nutrient needs related to acute illness as evidenced by estimated needs  GOAL:   Patient will meet greater than or equal to 90% of their needs  MONITOR:   PO intake, Supplement acceptance, Weight trends, Labs  REASON FOR ASSESSMENT:   Malnutrition Screening Tool    ASSESSMENT:   71 y.o. male presented to the ED with 1 month of SOB and cough. PMH includes HTN, chronic bronchitis, and gout. Pt admitted with pneumonia.   Pt reports that his appetite has been for ~1 month since he started to be sick. Pt reports that he was eating while prior, states that he enjoys cooking at home. Pt reports a typical intake of: Breakfast: eggs, bagel, grapefruit Lunch: Sandwich Dinner: cooks something  RD observed 100% of breakfast tray completed; pt stated that his appetite is improving. Pt did not have a menu, RD found a menu and provided overview of menu.  Pt endorses some SOB and fatigue with eating. Discussed that ONS will help reach his needs until he can maintain proper intake via PO.  Pt reports that he drank an Ensure last night but is worried about it being milky and causing mucus. Discussed that it shouldn't be an issue but we can switch to an alternative supplement; pt agreeable and thankful.   Pt reports that he currently weights 205# and has some weight loss over the past month, but is okay/happy with the weight loss. Per EMR, pt has had a 9% weight loss in 2 weeks, this would be clinically significant for time frame. Pt reports that he is very active and plays golf.   Medications reviewed and  include: Protonix, IV antibiotics Labs reviewed.   NUTRITION - FOCUSED PHYSICAL EXAM:  Flowsheet Row Most Recent Value  Orbital Region No depletion  Upper Arm Region No depletion  Thoracic and Lumbar Region No depletion  Buccal Region No depletion  Temple Region No depletion  Clavicle Bone Region No depletion  Clavicle and Acromion Bone Region No depletion  Scapular Bone Region No depletion  Dorsal Hand No depletion  Patellar Region No depletion  Anterior Thigh Region No depletion  Posterior Calf Region No depletion  Edema (RD Assessment) None  Hair Reviewed  Eyes Reviewed  Mouth Reviewed  Skin Reviewed  Nails Reviewed       Diet Order:   Diet Order             Diet Heart Room service appropriate? Yes; Fluid consistency: Thin  Diet effective now                   EDUCATION NEEDS:   No education needs have been identified at this time  Skin:  Skin Assessment: Reviewed RN Assessment  Last BM:  12/06/2021  Height:   Ht Readings from Last 1 Encounters:  12/07/21 5\' 10"  (1.778 m)    Weight:   Wt Readings from Last 1 Encounters:  12/07/21 91.1 kg    Ideal Body Weight:  75.5 kg  BMI:  Body mass index is 28.82 kg/m.  Estimated Nutritional Needs:   Kcal:  2100-2300  Protein:  105-120 grams  Fluid:  >  2 L    Chamille Werntz Louie Casa, RD, LDN Clinical Dietitian See [facility] for contact information.

## 2021-12-08 NOTE — Care Management Important Message (Signed)
Important Message  Patient Details  Name: Rodney Foster MRN: [id number] Date of Birth: May 25, 1950   Medicare Important Message Given:  Yes     Orbie Pyo 12/08/2021, 1:54 PM

## 2021-12-08 NOTE — TOC Initial Note (Signed)
Transition of Care [facility]) - Initial/Assessment Note    Patient Details  Name: CEZAR MISIASZEK MRN: [id number] Date of Birth: Nov 01, 1950  Transition of Care [facility] CM/SW Contact:    Verdell Carmine, RN Phone Number: 12/08/2021, 5:11 PM  Clinical Narrative:                  Patient admitted with pneumonia. Day # 2. He  has history of alcohol use. SA outpatient treatment options placed on DC instructions. No other needs identified at this time       Patient Goals and CMS Choice        Expected Discharge Plan and Services                                                Prior Living Arrangements/Services     Patient language and need for interpreter reviewed:: Yes        Need for Family Participation in Patient Care: Yes (Comment) Care giver support system in place?: Yes (comment)   Criminal Activity/Legal Involvement Pertinent to Current Situation/Hospitalization: No - Comment as needed  Activities of Daily Living Home Assistive Devices/Equipment: CPAP ADL Screening (condition at time of admission) Patient's cognitive ability adequate to safely complete daily activities?: Yes Is the patient deaf or have difficulty hearing?: No Does the patient have difficulty seeing, even when wearing glasses/contacts?: No Does the patient have difficulty concentrating, remembering, or making decisions?: No Patient able to express need for assistance with ADLs?: No Does the patient have difficulty dressing or bathing?: No Independently performs ADLs?: Yes (appropriate for developmental age) Does the patient have difficulty walking or climbing stairs?: No Weakness of Legs: Left Weakness of Arms/Hands: Left  Permission Sought/Granted                  Emotional Assessment       Orientation: : Oriented to Self Alcohol / Substance Use: Alcohol Use Psych Involvement: No (comment)  Admission diagnosis:  Hypoxia [R09.02] CAP (community acquired pneumonia)  [J18.9] Chest pain [R07.9] Community acquired pneumonia, unspecified laterality [J18.9] Sepsis, due to unspecified organism, unspecified whether acute organ dysfunction present [facility]) [A41.9] Patient Active Problem List   Diagnosis Date Noted   CAP (community acquired pneumonia) 12/07/2021   Primary localized osteoarthritis of left knee 05/06/2020   Primary localized osteoarthritis of right knee 09/25/2019   Cough variant asthma 05/03/2017   Abnormal CXR 05/03/2017   Sinus congestion 04/10/2017   Chronic bronchitis ([place]) 04/10/2017   PVC (premature ventricular contraction) 10/10/2016   Obese 03/28/2016   Pain in joint, ankle and foot 06/01/2015   Tinea unguium 03/15/2015   Vitreous floater 03/15/2015   Herpes simplex    Hyperlipidemia    Gout    Hypertension    Hyperglycemia    Insomnia    Impotence    PCP:  [name], NP Pharmacy:   [id number] [place], [place] [address] [place] [address] Phone: [phone] Fax: [place], [place] [address] Phone: [phone] Fax: [phone]  [id number] [place], [place] [address] [place] [address] Phone: [phone] Fax: [phone]     Social Determinants of Health (  SDOH) Interventions    Readmission Risk Interventions No flowsheet data found.

## 2021-12-08 NOTE — Progress Notes (Addendum)
HD#2 SUBJECTIVE:  Patient Summary: Rodney Foster is a 71 y.o. with a pertinent PMH of OSA on CPAP, bronchitis, HTN, HLD, who presented with progressive dyspnea and cough and admitted for CAP.   Overnight Events: no acute events overnight. Refused CPAP overnight.       Interm History: Patient states he feels as though his breathing has improved slightly today. Reporting continued chest pain related to prior diagnosis of pleurisy. He is continuing to produce sputum and having difficulty taking deep breaths. No other complaints at this time.   OBJECTIVE:  Vital Signs: Vitals:   12/08/21 0003 12/08/21 0400 12/08/21 0746 12/08/21 1221  BP: 130/73  140/76 (!) 150/93  Pulse:   100 (!) 102  Resp:   19 20  Temp: 98 F (36.7 C) 98 F (36.7 C) 98 F (36.7 C) 98.6 F (37 C)  TempSrc: Oral Oral Oral Oral  SpO2:   96% 92%  Weight:      Height:       Supplemental O2: Nasal Cannula SpO2: 92 % O2 Flow Rate (L/min): 4 L/min  Filed Weights   12/07/21 1355  Weight: 91.1 kg     Intake/Output Summary (Last 24 hours) at 12/08/2021 1412 Last data filed at 12/08/2021 1345 Gross per 24 hour  Intake 1720.71 ml  Output 1375 ml  Net 345.71 ml   Net IO Since Admission: 584.38 mL [12/08/21 1412]  Physical Exam: Physical Exam Constitutional:      General: He is not in acute distress.    Appearance: He is well-developed. He is obese. He is diaphoretic. He is not ill-appearing or toxic-appearing.  HENT:     Head: Normocephalic and atraumatic.  Cardiovascular:     Rate and Rhythm: Regular rhythm. Tachycardia present.  Pulmonary:     Effort: No tachypnea, bradypnea, accessory muscle usage or respiratory distress.     Comments: Crackles noted, improved from prior.  Chest:     Chest wall: No mass or edema.  Abdominal:     General: Bowel sounds are normal.     Palpations: Abdomen is soft. There is no hepatomegaly or splenomegaly.  Musculoskeletal:        General: Normal range of motion.   Skin:    General: Skin is warm.  Neurological:     General: No focal deficit present.     Mental Status: He is alert and oriented to person, place, and time.  Psychiatric:        Mood and Affect: Mood normal.        Behavior: Behavior normal.    Patient Lines/Drains/Airways Status     Active Line/Drains/Airways     Name Placement date Placement time Site Days   Peripheral IV 12/06/21 20 G Right Antecubital 12/06/21  1139  Antecubital  1   Peripheral IV 12/06/21 20 G Left Arm 12/06/21  1156  Arm  1   Incision (Closed) 09/25/19 Knee Right 09/25/19  0907  -- 804   Incision (Closed) 05/06/20 Knee 05/06/20  0824  -- 580            Pertinent Labs: CBC Latest Ref Rng & Units 12/08/2021 12/07/2021 12/06/2021  WBC 4.0 - 10.5 K/uL 20.0(H) 18.0(H) 18.4(H)  Hemoglobin 13.0 - 17.0 g/dL 12.3(L) 12.1(L) 14.6  Hematocrit 39.0 - 52.0 % 37.0(L) 36.2(L) 44.0  Platelets 150 - 400 K/uL 308 310 384    CMP Latest Ref Rng & Units 12/08/2021 12/07/2021 12/06/2021  Glucose 70 - 99 mg/dL 110(H) 95  120(H)  BUN 8 - 23 mg/dL <5(L) 7(L) 11  Creatinine 0.61 - 1.24 mg/dL 0.78 0.79 1.11  Sodium 135 - 145 mmol/L 133(L) 129(L) 133(L)  Potassium 3.5 - 5.1 mmol/L 4.0 3.6 3.5  Chloride 98 - 111 mmol/L 97(L) 98 101  CO2 22 - 32 mmol/L 28 23 22   Calcium 8.9 - 10.3 mg/dL 8.8(L) 8.4(L) 9.2  Total Protein 6.5 - 8.1 g/dL 5.8(L) - 7.3  Total Bilirubin 0.3 - 1.2 mg/dL 0.9 - 1.1  Alkaline Phos 38 - 126 U/L 89 - 110  AST 15 - 41 U/L 14(L) - 20  ALT 0 - 44 U/L 14 - 19    No results for input(s): GLUCAP in the last 72 hours.   Pertinent Imaging: No results found.  ASSESSMENT/PLAN:  Assessment: Principal Problem:   CAP (community acquired pneumonia) Active Problems:   Hypertension   Obese   Chronic bronchitis ([place])   Rodney Foster is a 71 y.o. with pertinent PMH of chronic bronchitis, OSA on CPAP, HTN, HLD, gout who presented with progressive dyspnea and cough and admit for community acquired pneumonia on  hospital day 2. He appears to be improving clinically. His breathing sounds much better today, he reports improvement in his shortness of breath, pleural effusion seems to be  interestingly his WBC continues to rise.   Plan: #community acquired pneumonia, complicated by small pleural effusion  - He appears less inflamed today, though remains diaphoretic. Blood pressure stable although he remains tachycardic.   - Hyponatremia improving.  - he continues to produce sputum with bronchial breath sounds and wheeze noted on lung exam. He has albuterol inhaler PRN but would caution overuse given tachycardia. Flutter valve also available.  - Bedside ultrasound today continues to show small pleural effusion, not easily accessible for thoracentesis. Effusion likely contributing to pleuritic chest pain. Will provide the patient with an incentive spirometer to ensure he is taking adequately deep breaths and help avoid atelectasis. Will continue to monitor, may tap if effusion becomes large enough.  - Will continue treating with ceftriaxone 1g Q24hr - Satting 94% on 4L [place]. Continue supplemental oxygen for now. Wean as tolerated.  - continue pain control for pleuritic CP.  #alcohol use disorder Significant daily alcohol intake, denies withdrawals. - Will continue to monitor for signs of withdrawal.  - can consider ativan if signs of withdrawal  OSA on CPAP - will continue nightly CPAP  HTN - blood pressure has remained stable  HLD - continue statin  Gout - monitor.  Jerrye Bushy - protonix  Best Practice: Diet: Regular diet IVF: Fluids: LR VTE: enoxaparin (LOVENOX) injection 40 mg Start: 12/06/21 1800 Code: Full AB: ceftriaxone 1g Q24hr  DISPO: Anticipated discharge in 1-2 days to Home pending Medical stability.  Signature: Delene Ruffini, MD  Internal Medicine Resident, PGY-1 [facility]  Pager: [phone] 2:12 PM, 12/08/2021   Please contact the on call pager  after 5 pm and on weekends at [phone].

## 2021-12-08 NOTE — Discharge Instructions (Addendum)
Dear Mr. Owens Shark,  We treated you for pneumonia. We gave you a week of Ceftriaxone as well as a course of Azithromycin. Upon discharge, we will start you on a course of Augmentin. Please take this medication two times daily for 7 days. You will start the Augmentin tomorrow 12/16. Please also continue to take the Flonase and Claritin as well. Please also ensure that you follow up with Dr. Erin Fulling of [facility]. If you do not hear from their office, please call them. Lastly, it will be important to follow up with your primary care physician. We also recommend that you continue using your CPAP machine at home when you sleep and ensure that it is properly cleaned after each use. We noticed that your heart function was slightly reduced, likely related to your acute infection, but we recommend having an 2D echocardiogram performed in approximately 3 months to ensure heart function has returned to normal.        Intensive Outpatient Programs   [place] [address]                                                       [address] [place],  [place], [place]                                                              [place]         (Inpatient and outpatient)                                           [phone] (Suboxone and Methadone) [address]  [phone]                                                                                                     [facility]                                               [facility] - Intensive Outpatient [place] [address] [place], [place] [address]                                                 [place], [place]                                                              [phone]   [facility] (Outpatient, Inpatient, Chemical       [facility] (Groups and Residental) (insurance only) [phone]                                              [place], [place]                                                    [facility]  [facility] (for caregivers and family) [place] [address], [place], [place]                                                              [phone]   Residential Treatment Programs   [place]          [place](2 years) Residential: 90 days)                 [facility] ([place], [place], [place]                                                              [phone] or [phone]   [facility] [address]                                                               [place], [place]  [place], [place]                                                              [place]                                [facility] ([facility]) [place]                                                            [address] [place],  [address]                                                 [place], [place]                                                              [phone] Admissions: 8am-3pm M-F   [facility]: Residential Program [phone] Days)                     [facility]: [place], [place], [place] or [phone]                                             (Walk in Hours over the weekend or by referral)     [place] Crisis: [facility]:[phone] (for crisis response 24 hours a day)     Last Modified by Coralee Pesa, LCSWA at 09/16/21 1447

## 2021-12-09 DIAGNOSIS — J189 Pneumonia, unspecified organism: Secondary | ICD-10-CM | POA: Diagnosis not present

## 2021-12-09 LAB — COMPREHENSIVE METABOLIC PANEL
ALT: 16 U/L (ref 0–44)
AST: 14 U/L — ABNORMAL LOW (ref 15–41)
Albumin: 2.1 g/dL — ABNORMAL LOW (ref 3.5–5.0)
Alkaline Phosphatase: 88 U/L (ref 38–126)
Anion gap: 9 (ref 5–15)
BUN: <5 mg/dL — ABNORMAL LOW (ref 8–23)
CO2: 29 mmol/L (ref 22–32)
Calcium: 8.6 mg/dL — ABNORMAL LOW (ref 8.9–10.3)
Chloride: 98 mmol/L (ref 98–111)
Creatinine, Ser: 0.81 mg/dL (ref 0.61–1.24)
GFR, Estimated: >60 mL/min (ref >60)
Glucose, Bld: 135 mg/dL — ABNORMAL HIGH (ref 70–99)
Potassium: 3.5 mmol/L (ref 3.5–5.1)
Sodium: 136 mmol/L (ref 135–145)
Total Bilirubin: 0.1 mg/dL — ABNORMAL LOW (ref 0.3–1.2)
Total Protein: 5.7 g/dL — ABNORMAL LOW (ref 6.5–8.1)

## 2021-12-09 LAB — CBC WITH DIFFERENTIAL/PLATELET
Abs Immature Granulocytes: 0.08 10*3/uL — ABNORMAL HIGH (ref 0.00–0.07)
Basophils Absolute: 0 10*3/uL (ref 0.0–0.1)
Basophils Relative: 0 %
Eosinophils Absolute: 0.1 10*3/uL (ref 0.0–0.5)
Eosinophils Relative: 1 %
HCT: 36.2 % — ABNORMAL LOW (ref 39.0–52.0)
Hemoglobin: 11.7 g/dL — ABNORMAL LOW (ref 13.0–17.0)
Immature Granulocytes: 1 %
Lymphocytes Relative: 14 %
Lymphs Abs: 2.3 10*3/uL (ref 0.7–4.0)
MCH: 31.6 pg (ref 26.0–34.0)
MCHC: 32.3 g/dL (ref 30.0–36.0)
MCV: 97.8 fL (ref 80.0–100.0)
Monocytes Absolute: 2 10*3/uL — ABNORMAL HIGH (ref 0.1–1.0)
Monocytes Relative: 12 %
Neutro Abs: 11.9 10*3/uL — ABNORMAL HIGH (ref 1.7–7.7)
Neutrophils Relative %: 72 %
Platelets: 304 10*3/uL (ref 150–400)
RBC: 3.7 MIL/uL — ABNORMAL LOW (ref 4.22–5.81)
RDW: 12.3 % (ref 11.5–15.5)
WBC: 16.5 10*3/uL — ABNORMAL HIGH (ref 4.0–10.5)
nRBC: 0 % (ref 0.0–0.2)

## 2021-12-09 MED ORDER — LACTATED RINGERS IV SOLN: INTRAVENOUS | Status: DC

## 2021-12-09 NOTE — Progress Notes (Addendum)
HD#3 SUBJECTIVE:  Interm History: Patient evaluated at bedside this morning.  Friend at bedside.  Patient states he is feeling better this morning.  He wore CPAP last night and was able to sleep much better overnight.  Notes that his shortness of breath is improving and albuterol inhaler was very helpful.  Still continues to have some pleuritic chest pain but he feels that this is improving as well.  Continues to endorse cough but secretions have improved.  He is using his incentive spirometer and flutter valve.  Encouraged him to continue using these.  Continues to have episodes of sweating.  Feels that the swelling improved overnight but did have more sweating this morning after CPAP was removed.  He requests being able to shower this morning.  OBJECTIVE:  Vital Signs: Vitals:   12/08/21 1640 12/08/21 2006 12/08/21 2306 12/09/21 0400  BP: 137/82     Pulse: 97     Resp: 20     Temp: 98.1 F (36.7 C) 98.2 F (36.8 C)  99 F (37.2 C)  TempSrc: Oral Oral  Oral  SpO2: 93%  98%   Weight:      Height:       Supplemental O2: Nasal Cannula SpO2: 98 % O2 Flow Rate (L/min): 3 L/min  Filed Weights   12/07/21 1355  Weight: 91.1 kg     Intake/Output Summary (Last 24 hours) at 12/09/2021 0559 Last data filed at 12/09/2021 0400 Gross per 24 hour  Intake 1708.19 ml  Output 1775 ml  Net -66.81 ml    Net IO Since Admission: 1,652.57 mL [12/09/21 0559]  Physical Exam: Physical Exam Constitutional:      General: He is not in acute distress.    Appearance: He is well-developed. He is obese. He is diaphoretic.  HENT:     Head: Normocephalic and atraumatic.  Cardiovascular:     Rate and Rhythm: Regular rhythm. Tachycardia present.  Pulmonary:     Effort: No respiratory distress.     Comments: Breath sound appreciated throughout all lung fields. Crackles at lung bases R>L, no wheezing Chest:     Chest wall: No mass or edema.  Abdominal:     General: Bowel sounds are normal.      Palpations: Abdomen is soft.     Tenderness: There is no abdominal tenderness.  Musculoskeletal:        General: Normal range of motion.     Right lower leg: No edema.     Left lower leg: No edema.  Skin:    General: Skin is warm.  Neurological:     General: No focal deficit present.     Mental Status: He is alert and oriented to person, place, and time.  Psychiatric:        Mood and Affect: Mood normal.        Behavior: Behavior normal.    Pertinent Labs: CBC Latest Ref Rng & Units 12/09/2021 12/08/2021 12/07/2021  WBC 4.0 - 10.5 K/uL 16.5(H) 20.0(H) 18.0(H)  Hemoglobin 13.0 - 17.0 g/dL 11.7(L) 12.3(L) 12.1(L)  Hematocrit 39.0 - 52.0 % 36.2(L) 37.0(L) 36.2(L)  Platelets 150 - 400 K/uL 304 308 310    CMP Latest Ref Rng & Units 12/09/2021 12/08/2021 12/07/2021  Glucose 70 - 99 mg/dL 135(H) 110(H) 95  BUN 8 - 23 mg/dL <5(L) <5(L) 7(L)  Creatinine 0.61 - 1.24 mg/dL 0.81 0.78 0.79  Sodium 135 - 145 mmol/L 136 133(L) 129(L)  Potassium 3.5 - 5.1 mmol/L 3.5 4.0 3.6  Chloride 98 - 111 mmol/L 98 97(L) 98  CO2 22 - 32 mmol/L 29 28 23   Calcium 8.9 - 10.3 mg/dL 8.6(L) 8.8(L) 8.4(L)  Total Protein 6.5 - 8.1 g/dL 5.7(L) 5.8(L) -  Total Bilirubin 0.3 - 1.2 mg/dL 0.1(L) 0.9 -  Alkaline Phos 38 - 126 U/L 88 89 -  AST 15 - 41 U/L 14(L) 14(L) -  ALT 0 - 44 U/L 16 14 -    No results for input(s): GLUCAP in the last 72 hours.   Pertinent Imaging: No results found.  ASSESSMENT/PLAN:  Assessment: Principal Problem:   CAP (community acquired pneumonia) Active Problems:   Hypertension   Obese   Chronic bronchitis ([place])   Rodney Foster is a 71 y.o. with pertinent PMH of chronic bronchitis, OSA on CPAP, HTN, HLD, gout who presented with progressive dyspnea and cough and admit for community acquired pneumonia on hospital day 3. He appears to be improving clinically. His breathing sounds much better today, he reports improvement in his shortness of breath, pleural effusion seems to be   interestingly his WBC continues to rise.   Plan: #community acquired pneumonia, complicated by small pleural effusion  Patient continues to be diaphoretic and tachycardic morning.  Symptoms seem to be improving.  White count down trended from 20 to 16 this morning.  Sputum gram stain with gram-positive rods and cocci, culture pending.  Blood cultures negative.  Remained stable on 3 L of O2.  Mild fever of 100.2 yesterday, fever curve is improving.  Patient with slow clinical improvement will continue with current antibiotics and supportive care.  If he has worsening symptoms in the next few days would consider broadening antibiotics and work-up. - inhaler PRN but would caution overuse given tachycardia. Flutter valve also available.  -Continue ceftriaxone is on day 4 of antibiotics will continue given improved symptoms clinically. -Continue albuterol as needed -Follow-up MRSA PCR, follow-up sputum cultures -Incentive spirometer, flutter valve -O2 as needed to maintain oxygen saturations greater than 92%. -Monitor fever curve, daily CBCs -Encourage CPAP use  #alcohol use disorder Significant daily alcohol intake.  No withdrawal symptoms this admission.   OSA on CPAP -Continue CPAP  HTN - blood pressure has remained stable  HLD - continue statin  Gout - monitor.  Jerrye Bushy - protonix  Best Practice: Diet: Regular diet IVF: Fluids: LR VTE: enoxaparin (LOVENOX) injection 40 mg Start: 12/06/21 1800 Code: Full AB: ceftriaxone 1g Q24hr  DISPO: Anticipated discharge in 1-2 days to Home pending Medical stability.  Signature: Iona Beard, MD  Internal Medicine Resident, PGY-2 [facility]  Pager: [phone] 5:59 AM, 12/09/2021   Please contact the on call pager after 5 pm and on weekends at [phone].

## 2021-12-10 LAB — BASIC METABOLIC PANEL
Anion gap: 10 (ref 5–15)
BUN: 5 mg/dL — ABNORMAL LOW (ref 8–23)
CO2: 25 mmol/L (ref 22–32)
Calcium: 8.5 mg/dL — ABNORMAL LOW (ref 8.9–10.3)
Chloride: 100 mmol/L (ref 98–111)
Creatinine, Ser: 0.7 mg/dL (ref 0.61–1.24)
GFR, Estimated: >60 mL/min (ref >60)
Glucose, Bld: 104 mg/dL — ABNORMAL HIGH (ref 70–99)
Potassium: 3.6 mmol/L (ref 3.5–5.1)
Sodium: 135 mmol/L (ref 135–145)

## 2021-12-10 LAB — CBC
HCT: 34.9 % — ABNORMAL LOW (ref 39.0–52.0)
Hemoglobin: 11.4 g/dL — ABNORMAL LOW (ref 13.0–17.0)
MCH: 31.6 pg (ref 26.0–34.0)
MCHC: 32.7 g/dL (ref 30.0–36.0)
MCV: 96.7 fL (ref 80.0–100.0)
Platelets: 308 10*3/uL (ref 150–400)
RBC: 3.61 MIL/uL — ABNORMAL LOW (ref 4.22–5.81)
RDW: 12.2 % (ref 11.5–15.5)
WBC: 15 10*3/uL — ABNORMAL HIGH (ref 4.0–10.5)
nRBC: 0 % (ref 0.0–0.2)

## 2021-12-10 LAB — CULTURE, RESPIRATORY W GRAM STAIN: Culture: NORMAL

## 2021-12-10 LAB — CULTURE, BLOOD (SINGLE)
Culture: NO GROWTH
Special Requests: ADEQUATE

## 2021-12-10 MED ORDER — AMLODIPINE BESYLATE 5 MG PO TABS
5.0000 mg | ORAL_TABLET | Freq: Every day | ORAL | Status: DC
  Administered 2021-12-10 – 2021-12-14 (×5): 5 mg via ORAL
  Filled 2021-12-10 (×5): qty 1

## 2021-12-10 NOTE — Progress Notes (Addendum)
HD#4 SUBJECTIVE:  Interm History: Patient seen and evaluated at bedside. He reports he is feeling improved today, but does not believe he is back to baseline. Had some pain overnight, but reports improvement today and is able to move and sleep on right side without severe discomfort. No other concerns at this time. Encouraged continued use of IS and acapella valve. He feels as though he will be able to get up out of bed today.  OBJECTIVE:  Vital Signs: Vitals:   12/10/21 0400 12/10/21 0500 12/10/21 0742 12/10/21 1222  BP: (!) 177/114 (!) 154/87 (!) 145/96 (!) 155/88  Pulse: 85 95 (!) 103 96  Resp: 20 20 20 20   Temp: 98.6 F (37 C)     TempSrc: Axillary  Oral Oral  SpO2: 94% 94% 91% 94%  Weight:      Height:       Supplemental O2: Nasal Cannula SpO2: 94 % O2 Flow Rate (L/min): 4 L/min FiO2 (%): 32 %  Filed Weights   12/07/21 1355  Weight: 91.1 kg     Intake/Output Summary (Last 24 hours) at 12/10/2021 1316 Last data filed at 12/10/2021 1222 Gross per 24 hour  Intake 1468.47 ml  Output 3025 ml  Net -1556.53 ml    Net IO Since Admission: -159.96 mL [12/10/21 1316]  Physical Exam: Physical Exam Constitutional:      General: He is not in acute distress.    Appearance: He is well-developed. He is obese.  HENT:     Head: Normocephalic and atraumatic.  Eyes:     Extraocular Movements: Extraocular movements intact.     Pupils: Pupils are equal, round, and reactive to light.  Cardiovascular:     Rate and Rhythm: Regular rhythm. Tachycardia present.  Pulmonary:     Effort: No respiratory distress.     Breath sounds: Examination of the right-lower field reveals rales. Examination of the left-lower field reveals rales. Rales present.     Comments: Breath sound appreciated throughout all lung fields. Crackles at lung bases R>L Chest:     Chest wall: No mass or edema.  Abdominal:     General: Bowel sounds are normal.     Palpations: Abdomen is soft.     Tenderness:  There is no abdominal tenderness.  Musculoskeletal:        General: Normal range of motion.     Right lower leg: No edema.     Left lower leg: No edema.  Skin:    General: Skin is warm.  Neurological:     General: No focal deficit present.     Mental Status: He is alert and oriented to person, place, and time.  Psychiatric:        Mood and Affect: Mood normal.        Behavior: Behavior normal.    Pertinent Labs: CBC Latest Ref Rng & Units 12/10/2021 12/09/2021 12/08/2021  WBC 4.0 - 10.5 K/uL 15.0(H) 16.5(H) 20.0(H)  Hemoglobin 13.0 - 17.0 g/dL 11.4(L) 11.7(L) 12.3(L)  Hematocrit 39.0 - 52.0 % 34.9(L) 36.2(L) 37.0(L)  Platelets 150 - 400 K/uL 308 304 308    CMP Latest Ref Rng & Units 12/10/2021 12/09/2021 12/08/2021  Glucose 70 - 99 mg/dL 104(H) 135(H) 110(H)  BUN 8 - 23 mg/dL 5(L) <5(L) <5(L)  Creatinine 0.61 - 1.24 mg/dL 0.70 0.81 0.78  Sodium 135 - 145 mmol/L 135 136 133(L)  Potassium 3.5 - 5.1 mmol/L 3.6 3.5 4.0  Chloride 98 - 111 mmol/L 100 98 97(L)  CO2 22 - 32 mmol/L 25 29 28   Calcium 8.9 - 10.3 mg/dL 8.5(L) 8.6(L) 8.8(L)  Total Protein 6.5 - 8.1 g/dL - 5.7(L) 5.8(L)  Total Bilirubin 0.3 - 1.2 mg/dL - 0.1(L) 0.9  Alkaline Phos 38 - 126 U/L - 88 89  AST 15 - 41 U/L - 14(L) 14(L)  ALT 0 - 44 U/L - 16 14    No results for input(s): GLUCAP in the last 72 hours.   Pertinent Imaging: No results found.  ASSESSMENT/PLAN:  Assessment: Principal Problem:   CAP (community acquired pneumonia) Active Problems:   Hypertension   Obese   Chronic bronchitis ([place])   Rodney Foster is a 71 y.o. with pertinent PMH of chronic bronchitis, OSA on CPAP, HTN, HLD, gout who presented with progressive dyspnea and cough and admit for community acquired pneumonia on hospital day 4. He appears to be improving clinically. His breathing sounds much better today, he reports improvement in his shortness of breath, encouraged continued supportive measures including incentive spirometer and  Acapella valve.  He is making slow improvement, will likely need 2 more days abx.   Plan: #community acquired pneumonia, complicated by small pleural effusion  Patient with slow clinical improvement, pt reporting improvement in breathing today. He has been afebrile with stable vital signs. Breathing sounds somewhat improved today. WBC 15 today   - Will continue with current antibiotics and supportive care.  If he has worsening symptoms in the next few days would consider broadening antibiotics and work-up. - inhaler PRN but would caution overuse given tachycardia. Flutter valve also available.  - Ceftriaxone day 5. He is improving slower than expected, will plan to continue IV ceftriaxone for likely 2 more days. -Continue albuterol as needed -Incentive spirometer, flutter valve -O2 as needed to maintain oxygen saturations greater than 92%. -Monitor fever curve, daily CBCs -Encourage CPAP use  #alcohol use disorder Significant daily alcohol intake.  No withdrawal symptoms this admission.   OSA on CPAP -Continue CPAP  HTN - blood pressure has remained stable  HLD - continue statin  Gout - monitor.  Jerrye Bushy - protonix  Best Practice: Diet: Regular diet IVF: Fluids: LR VTE: enoxaparin (LOVENOX) injection 40 mg Start: 12/06/21 1800 Code: Full AB: ceftriaxone 1g Q24hr  DISPO: Anticipated discharge in 1-2 days to Home pending Medical stability.  Signature: Delene Ruffini, MD  Internal Medicine Resident, PGY-1 [facility]  Pager: [phone] 1:16 PM, 12/10/2021   Please contact the on call pager after 5 pm and on weekends at [phone].

## 2021-12-11 ENCOUNTER — Inpatient Hospital Stay (HOSPITAL_COMMUNITY): Payer: Medicare HMO

## 2021-12-11 DIAGNOSIS — R0609 Other forms of dyspnea: Secondary | ICD-10-CM | POA: Diagnosis not present

## 2021-12-11 DIAGNOSIS — J90 Pleural effusion, not elsewhere classified: Secondary | ICD-10-CM | POA: Diagnosis not present

## 2021-12-11 LAB — CULTURE, BLOOD (ROUTINE X 2)
Culture: NO GROWTH
Culture: NO GROWTH
Special Requests: ADEQUATE

## 2021-12-11 LAB — CBC
HCT: 36.5 % — ABNORMAL LOW (ref 39.0–52.0)
Hemoglobin: 12.4 g/dL — ABNORMAL LOW (ref 13.0–17.0)
MCH: 32.4 pg (ref 26.0–34.0)
MCHC: 34 g/dL (ref 30.0–36.0)
MCV: 95.3 fL (ref 80.0–100.0)
Platelets: 349 10*3/uL (ref 150–400)
RBC: 3.83 MIL/uL — ABNORMAL LOW (ref 4.22–5.81)
RDW: 12.3 % (ref 11.5–15.5)
WBC: 15.5 10*3/uL — ABNORMAL HIGH (ref 4.0–10.5)
nRBC: 0 % (ref 0.0–0.2)

## 2021-12-11 LAB — BASIC METABOLIC PANEL
Anion gap: 12 (ref 5–15)
BUN: 7 mg/dL — ABNORMAL LOW (ref 8–23)
CO2: 25 mmol/L (ref 22–32)
Calcium: 8.8 mg/dL — ABNORMAL LOW (ref 8.9–10.3)
Chloride: 96 mmol/L — ABNORMAL LOW (ref 98–111)
Creatinine, Ser: 0.73 mg/dL (ref 0.61–1.24)
GFR, Estimated: >60 mL/min (ref >60)
Glucose, Bld: 113 mg/dL — ABNORMAL HIGH (ref 70–99)
Potassium: 3.4 mmol/L — ABNORMAL LOW (ref 3.5–5.1)
Sodium: 133 mmol/L — ABNORMAL LOW (ref 135–145)

## 2021-12-11 LAB — ECHOCARDIOGRAM COMPLETE
Area-P 1/2: 3.83 cm2
Height: 70 in
S' Lateral: 2.7 cm
Weight: 3213.42 oz

## 2021-12-11 MED ORDER — FLUTICASONE PROPIONATE 50 MCG/ACT NA SUSP
1.0000 | Freq: Every day | NASAL | Status: DC
  Administered 2021-12-14: 1 via NASAL

## 2021-12-11 MED ORDER — SODIUM CHLORIDE 0.9 % IV SOLN
500.0000 mg | INTRAVENOUS | Status: DC
  Administered 2021-12-11: 500 mg via INTRAVENOUS
  Filled 2021-12-11 (×2): qty 5

## 2021-12-11 MED ORDER — POLYETHYLENE GLYCOL 3350 17 G PO PACK
17.0000 g | PACK | Freq: Every day | ORAL | Status: DC
  Administered 2021-12-11 – 2021-12-14 (×4): 17 g via ORAL
  Filled 2021-12-11 (×4): qty 1

## 2021-12-11 MED ORDER — LORATADINE 10 MG PO TABS
10.0000 mg | ORAL_TABLET | Freq: Every day | ORAL | Status: DC
  Administered 2021-12-11 – 2021-12-14 (×4): 10 mg via ORAL
  Filled 2021-12-11 (×4): qty 1

## 2021-12-11 MED ORDER — FLUTICASONE PROPIONATE 50 MCG/ACT NA SUSP
1.0000 | Freq: Two times a day (BID) | NASAL | Status: AC
  Administered 2021-12-11 – 2021-12-13 (×5): 1 via NASAL
  Filled 2021-12-11 (×2): qty 16

## 2021-12-11 MED ORDER — SENNOSIDES-DOCUSATE SODIUM 8.6-50 MG PO TABS
1.0000 | ORAL_TABLET | Freq: Every day | ORAL | Status: DC
  Administered 2021-12-11 – 2021-12-13 (×3): 1 via ORAL
  Filled 2021-12-11 (×3): qty 1

## 2021-12-11 NOTE — Progress Notes (Signed)
   S: Patient feeling well today, slightly better each day.  Overall feels much better than when he first was admitted, but still much worse than his usual baseline which he is very active and independent.  Continues to have some right-sided pleuritic pain with deep inspiration.  Using the Acapella and incentive spirometry well, having small amounts of sputum production, no blood.  Able to ambulate around the room, take a shower, still using 2-4 L supplemental oxygen continuously.  O:  Vitals:   12/11/21 0724 12/11/21 1140  BP: 136/86 136/81  Pulse: 97 97  Resp: (!) 23 (!) 22  Temp: 99.3 F (37.4 C) 98.2 F (36.8 C)  SpO2: 95% 95%   Gen: well appearing person, no distress Lungs: coarse expiratory breath sounds at the bilateral bases, no wheezing, no dullness to percussion Ext: Warm and well-perfused, no lower extremity edema Neuro: Alert, oriented, full strength upper and lower extremities  A/P  Principal Problem:   CAP (community acquired pneumonia) Active Problems:   Hypertension   Obese   Chronic bronchitis ([place])  Community acquired pneumonia: Fever curve has been improving the last 4 days, neutrophil predominant leukocytosis remains about the same at 15.  Blood cultures are no growth, sputum cultures is only normal respiratory flora, no staph aureus or Pseudomonas seen. Right-sided pleural effusion continues to seem too small for sampling.  At this point if this was infectious pneumonia I would have expected it to improve more after 6 days of IV antibiotics, especially given he is immunocompetent and no underlying structural lung disease.  This raises a suspicion for noninfectious causes of pulmonary infiltrates and adenopathy such as autoimmune pneumonitis or endobronchial malignancy.  We will plan to continue with ceftriaxone for 1 more day.  We will consult with pulmonology service to consider bronchoscopy for cultures and potentially biopsy.  Continue with incentive spirometer and  Acapella valve, albuterol nebulizers as needed.  Oxycodone and Toradol as needed for pleuritic chest pain.  Hypertension well-controlled, continue amlodipine 5 mg daily  Lovenox for DVT prophylaxis  Regular diet   Axel Filler, MD 12/11/2021, 12:16 PM

## 2021-12-11 NOTE — Progress Notes (Addendum)
HD#6 SUBJECTIVE:  Interm History: patient seen and evaluated. He is sitting up in his chair at bedside. He appears much more comfortable today, less diaphoretic and better groomed. Reports  he feels improved today. Required oxy X2 overnight, but states pain is better.   OBJECTIVE:  Vital Signs: Vitals:   12/12/21 0024 12/12/21 0445 12/12/21 0816 12/12/21 1100  BP: 135/80 (!) 154/95 128/79 (!) 144/87  Pulse: 96 97 93 96  Resp: 20 20 18 20   Temp: 99.3 F (37.4 C) 98.4 F (36.9 C) 98.1 F (36.7 C) 98.9 F (37.2 C)  TempSrc: Oral Oral Oral Oral  SpO2: 94% 90% 90% 91%  Weight:      Height:       Supplemental O2: Nasal Cannula SpO2: 91 % O2 Flow Rate (L/min): 4 L/min FiO2 (%): 32 %  Filed Weights   12/07/21 1355  Weight: 91.1 kg     Intake/Output Summary (Last 24 hours) at 12/12/2021 1337 Last data filed at 12/12/2021 0910 Gross per 24 hour  Intake 240 ml  Output 900 ml  Net -660 ml   Net IO Since Admission: -2,479.96 mL [12/12/21 1337]  Physical Exam: Physical Exam Constitutional:      General: He is not in acute distress.    Appearance: He is well-developed. He is obese.  HENT:     Head: Normocephalic and atraumatic.  Eyes:     Extraocular Movements: Extraocular movements intact.     Pupils: Pupils are equal, round, and reactive to light.  Cardiovascular:     Rate and Rhythm: Normal rate and regular rhythm.  Pulmonary:     Effort: No respiratory distress.     Breath sounds: Examination of the right-lower field reveals rales. Examination of the left-lower field reveals rales. Rales present.     Comments: Breath sound appreciated throughout all lung fields. Crackles at lung bases R>L, improving Chest:     Chest wall: No mass or edema.  Abdominal:     General: Bowel sounds are normal.     Palpations: Abdomen is soft.     Tenderness: There is no abdominal tenderness.  Musculoskeletal:        General: Normal range of motion.     Right lower leg: No edema.      Left lower leg: No edema.  Skin:    General: Skin is warm.  Neurological:     General: No focal deficit present.     Mental Status: He is alert and oriented to person, place, and time.  Psychiatric:        Mood and Affect: Mood normal.        Behavior: Behavior normal.    Pertinent Labs: CBC Latest Ref Rng & Units 12/12/2021 12/11/2021 12/10/2021  WBC 4.0 - 10.5 K/uL 14.1(H) 15.5(H) 15.0(H)  Hemoglobin 13.0 - 17.0 g/dL 12.2(L) 12.4(L) 11.4(L)  Hematocrit 39.0 - 52.0 % 36.8(L) 36.5(L) 34.9(L)  Platelets 150 - 400 K/uL 321 349 308    CMP Latest Ref Rng & Units 12/12/2021 12/11/2021 12/10/2021  Glucose 70 - 99 mg/dL 109(H) 113(H) 104(H)  BUN 8 - 23 mg/dL 9 7(L) 5(L)  Creatinine 0.61 - 1.24 mg/dL 0.71 0.73 0.70  Sodium 135 - 145 mmol/L 134(L) 133(L) 135  Potassium 3.5 - 5.1 mmol/L 3.5 3.4(L) 3.6  Chloride 98 - 111 mmol/L 98 96(L) 100  CO2 22 - 32 mmol/L 24 25 25   Calcium 8.9 - 10.3 mg/dL 8.6(L) 8.8(L) 8.5(L)  Total Protein 6.5 - 8.1 g/dL 6.1(L) - -  Total Bilirubin 0.3 - 1.2 mg/dL 0.6 - -  Alkaline Phos 38 - 126 U/L 119 - -  AST 15 - 41 U/L 27 - -  ALT 0 - 44 U/L 25 - -    No results for input(s): GLUCAP in the last 72 hours.    ASSESSMENT/PLAN:  Assessment: Principal Problem:   CAP (community acquired pneumonia) Active Problems:   Hypertension   Obese   Chronic bronchitis ([place])   Pleural effusion   Rodney Foster is a 71 y.o. with pertinent PMH of chronic bronchitis, OSA on CPAP, HTN, HLD, gout who presented with progressive dyspnea and cough and admit for community acquired pneumonia on hospital day 6. He was initially treated with IV Rocephin for CAP complicated by effusion and received 6/7 days of IV antibiotics with subjective improvement in breathing, however, due to slow progression with continued oxygen requirement of 3L and consistently elevated WBC, pulm was consulted for further assistance. He was started on a course of azithromycin and appears to be  improving    Plan: #community acquired pneumonia, complicated by small pleural effusion  Appears much improved compared to yesterday. He is sitting up in chair at bedside, significantly less diaphoretic.  Lung sounds appear to be improved compared to prior. WBC with slight improvement today to 14.   - He is on day 7/7 Rocephin, will d/c after abx has finished. He has been started on azithromycin with first dose received yesterday. Will continue abx for now. Appreciate Pulm recommendations.  - inhaler PRN but would caution overuse given tachycardia. Acapella valve and incentive spirometer also available.  -O2 as needed to maintain oxygen saturations greater than 94%. -Monitor fever curve, daily CBCs -Encourage CPAP use  #Chronic HFmrEF Echo ordered yesterday shows mildly reduced EF to 45-50%. No history of heart disease. There is some mild cavity dilation and Grade 1 diastolic dysfunction. Patient appears euvolemic on exam today, he has tolerated IV fluid well during this admission. EF may be decreased in the setting of this acute illness. Will plan to restart losartan at discharge and add low dose beta blocker and statin. Can repeat echo in three months to see if EF recovers.   #alcohol use disorder Significant daily alcohol intake.  No withdrawal symptoms this admission.  - monitor.  OSA on CPAP -Continue CPAP as tolerated  HTN - blood pressure has remained stable  HLD - continue statin  Gout - monitor.  Jerrye Bushy - protonix  Best Practice: Diet: Regular diet IVF: Fluids: LR VTE: enoxaparin (LOVENOX) injection 40 mg Start: 12/06/21 1800 Code: Full AB: ceftriaxone 1g Q24hr  DISPO: Anticipated discharge in 1-2 days to Home pending Medical stability.  Signature: Delene Ruffini, MD  Internal Medicine Resident, PGY-1 [facility]  Pager: [phone] 1:37 PM, 12/12/2021   Please contact the on call pager after 5 pm and on weekends at  [phone].

## 2021-12-11 NOTE — Consult Note (Signed)
NAME:  Rodney Foster, MRN:  [id number], DOB:  Jan 02, 1950, LOS: 5 ADMISSION DATE:  12/06/2021, CONSULTATION DATE:  12/11/2021 REFERRING MD:  Dr. Evette Doffing, CHIEF COMPLAINT: Persistent hypoxic respiratory failure  History of Present Illness:  Rodney Foster is a 71 y.o. male with past medical history significant for but not limited to obstructive sleep apnea, asthma, former smoker, hyperlipidemia, hypertension, daily ETOH use, and arthritis who presented to the hospital for complaints of shortness of breath x4 weeks prior to admission.  Patient reported pneumonia was secondary to " poisoned his CPAP machine" as he had not been caring for the equipment adequately and reports not changing the water.  Was seen by primary care physician prior to admission and prescribed a Z-Pak with little to no improvement.  Patient also reported night sweats and chills but no fevers.  Arrival patient was seen mildly hypertensive but other vital signs within normal limits. Lab work on arrival significant for Na 133, albumin 3.4, WBC 18.4. Patient was admitted to IMTS service .   On 12/12 PCCM was consulted for a pulmonary consult due to persistent hypoxia.   Pertinent  Medical History  Obstructive sleep apnea, asthma, hyperlipidemia, hypertension, and arthritis  Significant Hospital Events: Including procedures, antibiotic start and stop dates in addition to other pertinent events   12/7 admitted for CAP  12/12 PCCM consulted   Micro  12/7 Urine culture > negative  12/7 sputum culture > Moderate gram positive coci and rare gram positive rods  12/7 blood culture > NTD  Imaging  12/8 > CTA chest lower lobe consolidation and consolidation in the right middle lobe and lingular segment of the left upper lobe, increased from the prior exam and concerning for pneumonia. There is bronchial wall thickening and prominent lymph nodes in the hilar region on the right. Small to moderate right pleural effusion, increased  prior  Interim History / Subjective:  Seen lying in bed with no acute distress States he still feels poor with continued cough   Objective   Blood pressure 136/81, pulse 97, temperature 98.2 F (36.8 C), temperature source Oral, resp. rate (!) 22, height 5\' 10"  (1.778 m), weight 91.1 kg, SpO2 95 %.        Intake/Output Summary (Last 24 hours) at 12/11/2021 1154 Last data filed at 12/11/2021 1028 Gross per 24 hour  Intake 780 ml  Output 2200 ml  Net -1420 ml   Filed Weights   12/07/21 1355  Weight: 91.1 kg    Examination: General: Well appearing elderly male lying in bed in NAD HEENT: [place]/AT, MM pink/moist, PERRL,  Neuro: Alert and oriented x3, non-focal  CV: s1s2 regular rate and rhythm, no murmur, rubs, or gallops,  PULM:  Clear to ascultation, upper airway congestion, productive cough, no increased work of breathing, oxygen saturations appropriate on 3L [place] GI: soft, bowel sounds active in all 4 quadrants, non-tender, non-distended, tolerating oral diet Extremities: warm/dry, no edema  Skin: no rashes or lesions  Resolved Hospital Problem list     Assessment & Plan:  Acute Hypoxic Respiratory Failure  -Not on oxygen at baseline, currently on 3L [place] acquire pneumonia  -Sputum culture with moderate gram positive coci and rare gram positive rods  Pleural effusion -Small to moderate right pleural effusion seen on CTA chest  Severe obstructive sleep apnea  -Previously saw Dr. Ander Slade in 2019 and home sleep study revealed severe obstruction  P: Repeat CXR now  Obtain ECHO  Remains on IV Ceftriaxone  May  need to consider thoracentesis for right effusion if effusion persist to rule out parapneumonic effusion  Encourage frequent pulmonary hygiene in IS and flutter  Mobilize as able  Wean supplemental oxygen for sat goal > 90 Add Flonase and Claritin for post nasal drip    Best Practice (right click and "Reselect all SmartList Selections" daily)  Per Primary    Labs   CBC: Recent Labs  Lab 12/05/21 1925 12/06/21 1109 12/07/21 0358 12/08/21 0227 12/09/21 0121 12/10/21 0039 12/11/21 0119  WBC 12.7* 18.4* 18.0* 20.0* 16.5* 15.0* 15.5*  NEUTROABS 7.9* 15.1*  --   --  11.9*  --   --   HGB 13.4 14.6 12.1* 12.3* 11.7* 11.4* 12.4*  HCT 40.6 44.0 36.2* 37.0* 36.2* 34.9* 36.5*  MCV 98.1 95.4 98.6 97.1 97.8 96.7 95.3  PLT 384 384 310 308 304 308 675    Basic Metabolic Panel: Recent Labs  Lab 12/07/21 0358 12/08/21 0227 12/09/21 0121 12/10/21 0039 12/11/21 0119  NA 129* 133* 136 135 133*  K 3.6 4.0 3.5 3.6 3.4*  CL 98 97* 98 100 96*  CO2 23 28 29 25 25   GLUCOSE 95 110* 135* 104* 113*  BUN 7* <5* <5* 5* 7*  CREATININE 0.79 0.78 0.81 0.70 0.73  CALCIUM 8.4* 8.8* 8.6* 8.5* 8.8*   GFR: Estimated Creatinine Clearance: 93.3 mL/min (by C-G formula based on SCr of 0.73 mg/dL). Recent Labs  Lab 12/05/21 1925 12/06/21 1109 12/07/21 0358 12/08/21 0227 12/09/21 0121 12/10/21 0039 12/11/21 0119  WBC 12.7* 18.4*   < > 20.0* 16.5* 15.0* 15.5*  LATICACIDVEN 1.5 1.2  --   --   --   --   --    < > = values in this interval not displayed.    Liver Function Tests: Recent Labs  Lab 12/05/21 1925 12/06/21 1109 12/08/21 0227 12/09/21 0121  AST 15 20 14* 14*  ALT 17 19 14 16   ALKPHOS 87 110 89 88  BILITOT 0.4 1.1 0.9 0.1*  PROT 6.9 7.3 5.8* 5.7*  ALBUMIN 3.2* 3.4* 2.4* 2.1*   No results for input(s): LIPASE, AMYLASE in the last 168 hours. No results for input(s): AMMONIA in the last 168 hours.  ABG No results found for: PHART, PCO2ART, PO2ART, HCO3, TCO2, ACIDBASEDEF, O2SAT   Coagulation Profile: Recent Labs  Lab 12/06/21 1109  INR 1.1    Cardiac Enzymes: No results for input(s): CKTOTAL, CKMB, CKMBINDEX, TROPONINI in the last 168 hours.  HbA1C: Hgb A1c MFr Bld  Date/Time Value Ref Range Status  01/20/2019 08:42 AM 5.5 <5.7 % of total Hgb Final    Comment:    For the purpose of screening for the presence  of diabetes: . <5.7%       Consistent with the absence of diabetes 5.7-6.4%    Consistent with increased risk for diabetes             (prediabetes) > or =6.5%  Consistent with diabetes . This assay result is consistent with a decreased risk of diabetes. . Currently, no consensus exists regarding use of hemoglobin A1c for diagnosis of diabetes in children. . According to American Diabetes Association (ADA) guidelines, hemoglobin A1c <7.0% represents optimal control in non-pregnant diabetic patients. Different metrics may apply to specific patient populations.  Standards of Medical Care in Diabetes(ADA). Marland Kitchen   09/12/2015 08:17 AM 5.5 4.8 - 5.6 % Final    Comment:             Pre-diabetes: 5.7 - 6.4  Diabetes: >6.4          Glycemic control for adults with diabetes: <7.0     CBG: No results for input(s): GLUCAP in the last 168 hours.  Review of Systems:   Please see the history of present illness. All other systems reviewed and are negative   Past Medical History:  He,  has a past medical history of Backache, unspecified, Bronchitis, Cough variant asthma (05/03/2017), Genital herpes, Gout, Hearing loss, Hemorrhoids, Hernia, Herpes simplex, Hyperglycemia, Hyperlipidemia, Hypertension, Impotence, Insomnia, unspecified, Osteoarthrosis, unspecified whether generalized or localized, unspecified site, Tinea pedis, right, and Unspecified disorder of male genital organs.   Surgical History:   Past Surgical History:  Procedure Laterality Date   fracture left femur  1984   ganglion repair of left forarm  2000   IVP  19   ML old comp fracture   KNEE SURGERY Right 01/21/2019   Raliegh Ip    NASAL SEPTUM SURGERY  1997   TONSILLECTOMY  1959   TONSILLECTOMY     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   Procedure: TOTAL KNEE ARTHROPLASTY;  Surgeon: Earlie Server, MD;  Location: [facility];  Service: Orthopedics;  Laterality: Right;   TOTAL KNEE ARTHROPLASTY Left 05/06/2020    Procedure: TOTAL KNEE ARTHROPLASTY;  Surgeon: Earlie Server, MD;  Location: [facility];  Service: Orthopedics;  Laterality: Left;   UMBILICAL HERNIA REPAIR  2002     Social History:   reports that he quit smoking about 37 years ago. His smoking use included cigarettes. He has a 15.00 pack-year smoking history. He quit smokeless tobacco use about 32 years ago. He reports current alcohol use of about 6.0 - 7.0 standard drinks per week. He reports that he does not use drugs.   Family History:  His family history includes Aortic aneurysm in his father; COPD in his mother.   Allergies No Known Allergies   Home Medications  Prior to Admission medications   Medication Sig Start Date End Date Taking? Authorizing Provider  acyclovir (ZOVIRAX) 400 MG tablet TAKE 1 TABLET BY MOUTH EVERY DAY Patient taking differently: Take 400 mg by mouth daily. 08/25/21  Yes [name], NP  amLODipine (NORVASC) 5 MG tablet TAKE ONE TABLET BY MOUTH ONCE DAILY TO CONTROL BLOOD PRESSURE Patient taking differently: Take 5 mg by mouth daily. to control blood pressure 08/25/21  Yes [name], NP  aspirin 325 MG tablet Take 650 mg by mouth every 6 (six) hours as needed for mild pain or headache.   Yes [provider]  atorvastatin (LIPITOR) 20 MG tablet TAKE 1 TABLET BY MOUTH EVERYDAY AT BEDTIME Patient taking differently: Take 20 mg by mouth daily. 10/30/21  Yes [name], NP  febuxostat (ULORIC) 40 MG tablet Take 1 tablet (40 mg total) by mouth daily. 03/09/20  Yes [name], NP  gabapentin (NEURONTIN) 100 MG capsule Take 1 capsule (100 mg total) by mouth 2 (two) times daily. Patient taking differently: Take 100 mg by mouth 2 (two) times daily as needed (pain). 11/17/21  Yes Medina-Vargas, Monina C, NP  HYDROcodone-acetaminophen (NORCO/VICODIN) 5-325 MG tablet Take 1 tablet by mouth every 8 (eight) hours as needed for moderate pain. 11/14/21  Yes [provider]  Multiple  Vitamins-Minerals (ANTIOXIDANT PO) Take 30 mLs by mouth daily. Aronia Berry Juice   Yes [provider]  omeprazole (PRILOSEC) 20 MG capsule Take 20 mg by mouth daily.   Yes [provider]  tadalafil (CIALIS) 20 MG tablet Take  1 tablet (20 mg total) by mouth daily as needed for erectile dysfunction. 04/06/21  Yes [name], NP  tiZANidine (ZANAFLEX) 4 MG tablet Take 4 mg by mouth every 8 (eight) hours as needed for muscle spasms. 11/14/21  Yes [provider]  zolpidem (AMBIEN) 5 MG tablet Take one tablet by mouth at bedtime as needed for sleep Patient taking differently: Take 5 mg by mouth at bedtime as needed for sleep. 11/17/21  Yes Medina-Vargas, Monina C, NP  acetaminophen (TYLENOL) 500 MG tablet Take 1 tablet (500 mg total) by mouth every 8 (eight) hours as needed for fever or moderate pain. Patient not taking: Reported on 12/06/2021 11/06/21   Ngetich, Dinah C, NP  guaiFENesin (MUCINEX) 600 MG 12 hr tablet Take 600-1,200 mg by mouth at bedtime as needed for cough or to loosen phlegm.    [provider]  losartan (COZAAR) 100 MG tablet TAKE ONE BY MOUTH DAILY FOR BLOOD PRESSURE CONTROL Patient taking differently: Take 100 mg by mouth daily. BLOOD PRESSURE CONTROL 11/03/21   [name], NP  methocarbamol (ROBAXIN) 500 MG tablet Take 1 tablet (500 mg total) by mouth 3 (three) times daily as needed for muscle spasms. Patient not taking: Reported on 12/06/2021 11/10/21   Yvonna Alanis, NP     Critical care time: NA  Kadesia Robel D. Kenton Kingfisher, NP-C Section Pulmonary & Critical Care Personal contact information can be found on Amion  12/11/2021, 1:12 PM

## 2021-12-11 NOTE — Plan of Care (Signed)

## 2021-12-12 LAB — CBC
HCT: 36.8 % — ABNORMAL LOW (ref 39.0–52.0)
Hemoglobin: 12.2 g/dL — ABNORMAL LOW (ref 13.0–17.0)
MCH: 31.6 pg (ref 26.0–34.0)
MCHC: 33.2 g/dL (ref 30.0–36.0)
MCV: 95.3 fL (ref 80.0–100.0)
Platelets: 321 10*3/uL (ref 150–400)
RBC: 3.86 MIL/uL — ABNORMAL LOW (ref 4.22–5.81)
RDW: 12.3 % (ref 11.5–15.5)
WBC: 14.1 10*3/uL — ABNORMAL HIGH (ref 4.0–10.5)
nRBC: 0 % (ref 0.0–0.2)

## 2021-12-12 LAB — COMPREHENSIVE METABOLIC PANEL
ALT: 25 U/L (ref 0–44)
AST: 27 U/L (ref 15–41)
Albumin: 2.1 g/dL — ABNORMAL LOW (ref 3.5–5.0)
Alkaline Phosphatase: 119 U/L (ref 38–126)
Anion gap: 12 (ref 5–15)
BUN: 9 mg/dL (ref 8–23)
CO2: 24 mmol/L (ref 22–32)
Calcium: 8.6 mg/dL — ABNORMAL LOW (ref 8.9–10.3)
Chloride: 98 mmol/L (ref 98–111)
Creatinine, Ser: 0.71 mg/dL (ref 0.61–1.24)
GFR, Estimated: >60 mL/min (ref >60)
Glucose, Bld: 109 mg/dL — ABNORMAL HIGH (ref 70–99)
Potassium: 3.5 mmol/L (ref 3.5–5.1)
Sodium: 134 mmol/L — ABNORMAL LOW (ref 135–145)
Total Bilirubin: 0.6 mg/dL (ref 0.3–1.2)
Total Protein: 6.1 g/dL — ABNORMAL LOW (ref 6.5–8.1)

## 2021-12-12 MED ORDER — SODIUM CHLORIDE 0.9 % IV SOLN
500.0000 mg | INTRAVENOUS | Status: DC
  Administered 2021-12-12 – 2021-12-13 (×2): 500 mg via INTRAVENOUS
  Filled 2021-12-12 (×2): qty 5

## 2021-12-12 NOTE — Progress Notes (Signed)
NAME:  Rodney Foster, MRN:  [id number], DOB:  [birth year], LOS: 6 ADMISSION DATE:  12/06/2021, CONSULTATION DATE:  12/11/2021 REFERRING MD:  Dr. Evette Doffing, CHIEF COMPLAINT: Persistent hypoxic respiratory failure  History of Present Illness:  Rodney Foster is a 71 y.o. male with past medical history significant for but not limited to obstructive sleep apnea, asthma, former smoker, hyperlipidemia, hypertension, daily ETOH use, and arthritis who presented to the hospital for complaints of shortness of breath x4 weeks prior to admission.  Patient reported pneumonia was secondary to " poisoned his CPAP machine" as he had not been caring for the equipment adequately and reports not changing the water.  Was seen by primary care physician prior to admission and prescribed a Z-Pak with little to no improvement.  Patient also reported night sweats and chills but no fevers.  Arrival patient was seen mildly hypertensive but other vital signs within normal limits. Lab work on arrival significant for Na 133, albumin 3.4, WBC 18.4. Patient was admitted to IMTS service .   On 12/12 PCCM was consulted for a pulmonary consult due to persistent hypoxia.   Pertinent  Medical History  Obstructive sleep apnea, asthma, hyperlipidemia, hypertension, and arthritis  Significant Hospital Events: Including procedures, antibiotic start and stop dates in addition to other pertinent events   12/7 admitted for CAP  12/12 PCCM consulted   Micro  12/7 Urine culture > negative  12/7 sputum culture > Moderate gram positive coci and rare gram positive rods  12/7 blood culture > NTD  Imaging  12/8 > CTA chest lower lobe consolidation and consolidation in the right middle lobe and lingular segment of the left upper lobe, increased from the prior exam and concerning for pneumonia. There is bronchial wall thickening and prominent lymph nodes in the hilar region on the right. Small to moderate right pleural effusion, increased  prior  Interim History / Subjective:  No acute events overnight. Continues with productive cough   Objective   Blood pressure 128/79, pulse 93, temperature 98.1 F (36.7 C), temperature source Oral, resp. rate 18, height 5\' 10"  (1.778 m), weight 91.1 kg, SpO2 90 %.        Intake/Output Summary (Last 24 hours) at 12/12/2021 1144 Last data filed at 12/12/2021 0910 Gross per 24 hour  Intake 240 ml  Output 900 ml  Net -660 ml   Filed Weights   12/07/21 1355  Weight: 91.1 kg    Examination: General: elderly male, sitting up in chair, no acute distress HEENT: [place]/AT, MM pink/moist, PERRL,  Neuro: Alert and oriented x3, non-focal  CV: s1s2 regular rate and rhythm, no murmur, rubs, or gallops,  PULM:  diminished breath sounds right base. No wheezing. GI: soft, bowel sounds active in all 4 quadrants, non-tender, non-distended, tolerating oral diet Extremities: warm/dry, no edema  Skin: no rashes or lesions  CXR 12/12 - increasing right opacities Bedside US 12/12 - possibly loculated effusion, difficult windows so unable to determine safe window for thoracentesis.  Echo 12/12 with normal RV function. Normal EF.   Resolved Hospital Problem list     Assessment & Plan:  Acute Hypoxic Respiratory Failure  -Not on oxygen at baseline, currently on 3L [place] [facility] acquire pneumonia  -Sputum culture with moderate gram positive coci and rare gram positive rods  Pleural effusion -Small to moderate right pleural effusion seen on CTA chest  Severe obstructive sleep apnea  -Previously saw Dr. Ander Slade in 2019 and home sleep study revealed severe obstruction  P:  Continue ceftriaxone and azithromycin for pneumonia coverage Will repeat CXR tomorrow, and may need repeat CT chest scan to evaluate the pleural effusions. WBC count continues to trend down so less concern for parapneumonic effusion or empyema.  Encourage frequent pulmonary hygiene in IS and flutter  Mobilize as able  Wean  supplemental oxygen for sat goal > 90 Continue Flonase and Claritin for post nasal drip    Best Practice (right click and "Reselect all SmartList Selections" daily)  Per Primary   Labs   CBC: Recent Labs  Lab 12/05/21 1925 12/06/21 1109 12/07/21 0358 12/08/21 0227 12/09/21 0121 12/10/21 0039 12/11/21 0119 12/12/21 0203  WBC 12.7* 18.4*   < > 20.0* 16.5* 15.0* 15.5* 14.1*  NEUTROABS 7.9* 15.1*  --   --  11.9*  --   --   --   HGB 13.4 14.6   < > 12.3* 11.7* 11.4* 12.4* 12.2*  HCT 40.6 44.0   < > 37.0* 36.2* 34.9* 36.5* 36.8*  MCV 98.1 95.4   < > 97.1 97.8 96.7 95.3 95.3  PLT 384 384   < > 308 304 308 349 321   < > = values in this interval not displayed.    Basic Metabolic Panel: Recent Labs  Lab 12/08/21 0227 12/09/21 0121 12/10/21 0039 12/11/21 0119 12/12/21 0203  NA 133* 136 135 133* 134*  K 4.0 3.5 3.6 3.4* 3.5  CL 97* 98 100 96* 98  CO2 28 29 25 25 24   GLUCOSE 110* 135* 104* 113* 109*  BUN <5* <5* 5* 7* 9  CREATININE 0.78 0.81 0.70 0.73 0.71  CALCIUM 8.8* 8.6* 8.5* 8.8* 8.6*   GFR: Estimated Creatinine Clearance: 93.3 mL/min (by C-G formula based on SCr of 0.71 mg/dL). Recent Labs  Lab 12/05/21 1925 12/06/21 1109 12/07/21 0358 12/09/21 0121 12/10/21 0039 12/11/21 0119 12/12/21 0203  WBC 12.7* 18.4*   < > 16.5* 15.0* 15.5* 14.1*  LATICACIDVEN 1.5 1.2  --   --   --   --   --    < > = values in this interval not displayed.    Liver Function Tests: Recent Labs  Lab 12/05/21 1925 12/06/21 1109 12/08/21 0227 12/09/21 0121 12/12/21 0203  AST 15 20 14* 14* 27  ALT 17 19 14 16 25   ALKPHOS 87 110 89 88 119  BILITOT 0.4 1.1 0.9 0.1* 0.6  PROT 6.9 7.3 5.8* 5.7* 6.1*  ALBUMIN 3.2* 3.4* 2.4* 2.1* 2.1*   No results for input(s): LIPASE, AMYLASE in the last 168 hours. No results for input(s): AMMONIA in the last 168 hours.  ABG No results found for: PHART, PCO2ART, PO2ART, HCO3, TCO2, ACIDBASEDEF, O2SAT   Coagulation Profile: Recent Labs  Lab  12/06/21 1109  INR 1.1    Cardiac Enzymes: No results for input(s): CKTOTAL, CKMB, CKMBINDEX, TROPONINI in the last 168 hours.  HbA1C: Hgb A1c MFr Bld  Date/Time Value Ref Range Status  01/20/2019 08:42 AM 5.5 <5.7 % of total Hgb Final    Comment:    For the purpose of screening for the presence of diabetes: . <5.7%       Consistent with the absence of diabetes 5.7-6.4%    Consistent with increased risk for diabetes             (prediabetes) > or =6.5%  Consistent with diabetes . This assay result is consistent with a decreased risk of diabetes. . Currently, no consensus exists regarding use of hemoglobin A1c for diagnosis of diabetes in  children. . According to American Diabetes Association (ADA) guidelines, hemoglobin A1c <7.0% represents optimal control in non-pregnant diabetic patients. Different metrics may apply to specific patient populations.  Standards of Medical Care in Diabetes(ADA). Marland Kitchen   09/12/2015 08:17 AM 5.5 4.8 - 5.6 % Final    Comment:             Pre-diabetes: 5.7 - 6.4          Diabetes: >6.4          Glycemic control for adults with diabetes: <7.0     CBG: No results for input(s): GLUCAP in the last 168 hours.  Critical care time: NA   Freda Jackson, MD [facility] Office: [phone]   See Amion for personal pager PCCM on call pager [phone] until 7pm. Please call Elink 7p-7a. [phone]

## 2021-12-12 NOTE — Plan of Care (Signed)

## 2021-12-12 NOTE — Progress Notes (Signed)
Visit made to patients room to put on CPAP.  Patient states he didn't rest well with it last night and requests not to wear it tonight.  Advised patient if he changing his mind to have RN call RT.

## 2021-12-12 NOTE — Progress Notes (Signed)
Nutrition Follow-up  DOCUMENTATION CODES:   Not applicable  INTERVENTION:   Encourage good PO intake Continue Boost Breeze po BID, each supplement provides 250 kcal and 9 grams of protein Multivitamin w/ minerals daily  NUTRITION DIAGNOSIS:   Increased nutrient needs related to acute illness as evidenced by estimated needs - Ongoing   GOAL:   Patient will meet greater than or equal to 90% of their needs - Progressing  MONITOR:   PO intake, Supplement acceptance, Weight trends, Labs  REASON FOR ASSESSMENT:   Malnutrition Screening Tool    ASSESSMENT:   71 y.o. male presented to the ED with 1 month of SOB and cough. PMH includes HTN, chronic bronchitis, and gout. Pt admitted with pneumonia.   Pt reports that his appetite is coming back and that he is feeling a lot better. Pt states that he is drinking the [employer]. States that his cough and phlegm production has decreased.  Per EMR, pt intake includes: 12/10: Breakfast 75% 12/11: Breakfast 50%, Lunch 100%, Dinner 75% 12/13: Breakfast 100%  Pt reports that the plan is for him to discharge on Friday.   Pt reports no other questions or concerns at this time.  Medications reviewed and include: Protonix, Miralax, Senokot, IV antibiotics Labs reviewed.   Diet Order:   Diet Order             Diet regular Room service appropriate? Yes; Fluid consistency: Thin  Diet effective now                   EDUCATION NEEDS:   No education needs have been identified at this time  Skin:  Skin Assessment: Reviewed RN Assessment  Last BM:  12/12/2021  Height:   Ht Readings from Last 1 Encounters:  12/07/21 5\' 10"  (1.778 m)    Weight:   Wt Readings from Last 1 Encounters:  12/07/21 91.1 kg    Ideal Body Weight:  75.5 kg  BMI:  Body mass index is 28.82 kg/m.  Estimated Nutritional Needs:   Kcal:  2100-2300  Protein:  105-120 grams  Fluid:  > 2 L    Illa Enlow Louie Casa, RD, LDN Clinical  Dietitian See [facility] for contact information.

## 2021-12-12 NOTE — Progress Notes (Signed)
Patient sitting on Room Air  94%  Patient ambulating on Room Air  91%  Patient on 2 liters 02  96% sitting  96% walking

## 2021-12-13 ENCOUNTER — Telehealth: Payer: Self-pay | Admitting: Pulmonary Disease

## 2021-12-13 ENCOUNTER — Inpatient Hospital Stay (HOSPITAL_COMMUNITY): Payer: Medicare HMO

## 2021-12-13 ENCOUNTER — Inpatient Hospital Stay: Payer: Medicare HMO

## 2021-12-13 DIAGNOSIS — J90 Pleural effusion, not elsewhere classified: Secondary | ICD-10-CM | POA: Diagnosis not present

## 2021-12-13 LAB — BASIC METABOLIC PANEL
Anion gap: 9 (ref 5–15)
BUN: 8 mg/dL (ref 8–23)
CO2: 26 mmol/L (ref 22–32)
Calcium: 8.8 mg/dL — ABNORMAL LOW (ref 8.9–10.3)
Chloride: 98 mmol/L (ref 98–111)
Creatinine, Ser: 0.73 mg/dL (ref 0.61–1.24)
GFR, Estimated: >60 mL/min (ref >60)
Glucose, Bld: 122 mg/dL — ABNORMAL HIGH (ref 70–99)
Potassium: 3.7 mmol/L (ref 3.5–5.1)
Sodium: 133 mmol/L — ABNORMAL LOW (ref 135–145)

## 2021-12-13 LAB — CBC
HCT: 36.2 % — ABNORMAL LOW (ref 39.0–52.0)
Hemoglobin: 11.9 g/dL — ABNORMAL LOW (ref 13.0–17.0)
MCH: 31.5 pg (ref 26.0–34.0)
MCHC: 32.9 g/dL (ref 30.0–36.0)
MCV: 95.8 fL (ref 80.0–100.0)
Platelets: 397 10*3/uL (ref 150–400)
RBC: 3.78 MIL/uL — ABNORMAL LOW (ref 4.22–5.81)
RDW: 12.3 % (ref 11.5–15.5)
WBC: 13.2 10*3/uL — ABNORMAL HIGH (ref 4.0–10.5)
nRBC: 0 % (ref 0.0–0.2)

## 2021-12-13 MED ORDER — ADULT MULTIVITAMIN W/MINERALS CH
1.0000 | ORAL_TABLET | Freq: Every day | ORAL | Status: DC
  Administered 2021-12-13 – 2021-12-14 (×2): 1 via ORAL
  Filled 2021-12-13 (×2): qty 1

## 2021-12-13 MED ORDER — SODIUM CHLORIDE 0.9 % IV SOLN
1.0000 g | INTRAVENOUS | Status: DC
  Administered 2021-12-13 – 2021-12-14 (×2): 1 g via INTRAVENOUS
  Filled 2021-12-13 (×2): qty 10

## 2021-12-13 NOTE — Plan of Care (Signed)

## 2021-12-13 NOTE — Progress Notes (Signed)
NAME:  Rodney Foster, MRN:  [id number], DOB:  July 29, 1950, LOS: 7 ADMISSION DATE:  12/06/2021, CONSULTATION DATE:  12/11/2021 REFERRING MD:  Dr. Evette Doffing, CHIEF COMPLAINT: Persistent hypoxic respiratory failure  History of Present Illness:  Rodney Foster is a 71 y.o. male with past medical history significant for but not limited to obstructive sleep apnea, asthma, former smoker, hyperlipidemia, hypertension, daily ETOH use, and arthritis who presented to the hospital for complaints of shortness of breath x4 weeks prior to admission.  Patient reported pneumonia was secondary to " poisoned his CPAP machine" as he had not been caring for the equipment adequately and reports not changing the water.  Was seen by primary care physician prior to admission and prescribed a Z-Pak with little to no improvement.  Patient also reported night sweats and chills but no fevers.  Arrival patient was seen mildly hypertensive but other vital signs within normal limits. Lab work on arrival significant for Na 133, albumin 3.4, WBC 18.4. Patient was admitted to IMTS service .   On 12/12 PCCM was consulted for a pulmonary consult due to persistent hypoxia.   Pertinent  Medical History  Obstructive sleep apnea, asthma, hyperlipidemia, hypertension, and arthritis  Significant Hospital Events: Including procedures, antibiotic start and stop dates in addition to other pertinent events   12/7 admitted for CAP  12/12 PCCM consulted   Micro  12/7 Urine culture > negative  12/7 sputum culture > Moderate gram positive coci and rare gram positive rods  12/7 blood culture > NTD  Imaging  12/8 > CTA chest lower lobe consolidation and consolidation in the right middle lobe and lingular segment of the left upper lobe, increased from the prior exam and concerning for pneumonia. There is bronchial wall thickening and prominent lymph nodes in the hilar region on the right. Small to moderate right pleural effusion, increased  prior  Interim History / Subjective:  No acute events overnight.  Feeling much better today.   Did not desaturate with ambulatory O2 monitoring yesterday.   Objective   Blood pressure 125/74, pulse 80, temperature 98.3 F (36.8 C), temperature source Oral, resp. rate 20, height 5\' 10"  (1.778 m), weight 91.1 kg, SpO2 94 %.        Intake/Output Summary (Last 24 hours) at 12/13/2021 1145 Last data filed at 12/12/2021 2200 Gross per 24 hour  Intake 120 ml  Output 1400 ml  Net -1280 ml   Filed Weights   12/07/21 1355  Weight: 91.1 kg    Examination: General: elderly male, sitting up in chair, no acute distress HEENT: [place]/AT, MM pink/moist, PERRL,  Neuro: Alert and oriented x3, non-focal  CV: s1s2 regular rate and rhythm, no murmur, rubs, or gallops,  PULM:  diminished breath sounds right base, scattered rales. No wheezing. GI: soft, bowel sounds active in all 4 quadrants, non-tender, non-distended, tolerating oral diet Extremities: warm/dry, no edema  Skin: no rashes or lesions  CXR 12/12 - increasing right opacities Bedside US 12/12 - possibly loculated effusion, difficult windows so unable to determine safe window for thoracentesis.  Echo 12/12 with normal RV function. Normal EF.   CXR 12/14 large homogeneous opacity in the right upper and right mid lung fields. This may suggest pneumonia or possibly loculated pleural effusion. Possibility of empyema is not excluded.  Resolved Hospital Problem list     Assessment & Plan:  Acute Hypoxic Respiratory Failure  -Not on oxygen at baseline, currently on 3L [place] acquire pneumonia  -Sputum culture with  moderate gram positive coci and rare gram positive rods  Pleural effusion -Small to moderate right pleural effusion seen on CTA chest  Severe obstructive sleep apnea  -Previously saw Dr. Ander Slade in 2019 and home sleep study revealed severe obstruction  P: Continue ceftriaxone and azithromycin for pneumonia  coverage Recommend transition to augmentin at time of discharge for a 7 day course. Will have him follow up in clinic on 12/21 at 4:30pm for repeat chest x-ray and close monitoring for any possible effusion.  Overall clinical status is improving so less concern for empyema at this time. Low threshold for repeat CT in the future.  Encourage frequent pulmonary hygiene in IS and flutter  Mobilize as able  Wean supplemental oxygen for sat goal > 90 Continue Flonase and Claritin for post nasal drip   PCCM will sign off.   Best Practice (right click and "Reselect all SmartList Selections" daily)  Per Primary   Labs   CBC: Recent Labs  Lab 12/09/21 0121 12/10/21 0039 12/11/21 0119 12/12/21 0203 12/13/21 0107  WBC 16.5* 15.0* 15.5* 14.1* 13.2*  NEUTROABS 11.9*  --   --   --   --   HGB 11.7* 11.4* 12.4* 12.2* 11.9*  HCT 36.2* 34.9* 36.5* 36.8* 36.2*  MCV 97.8 96.7 95.3 95.3 95.8  PLT 304 308 349 321 387    Basic Metabolic Panel: Recent Labs  Lab 12/09/21 0121 12/10/21 0039 12/11/21 0119 12/12/21 0203 12/13/21 0107  NA 136 135 133* 134* 133*  K 3.5 3.6 3.4* 3.5 3.7  CL 98 100 96* 98 98  CO2 29 25 25 24 26   GLUCOSE 135* 104* 113* 109* 122*  BUN <5* 5* 7* 9 8  CREATININE 0.81 0.70 0.73 0.71 0.73  CALCIUM 8.6* 8.5* 8.8* 8.6* 8.8*   GFR: Estimated Creatinine Clearance: 93.3 mL/min (by C-G formula based on SCr of 0.73 mg/dL). Recent Labs  Lab 12/10/21 0039 12/11/21 0119 12/12/21 0203 12/13/21 0107  WBC 15.0* 15.5* 14.1* 13.2*    Liver Function Tests: Recent Labs  Lab 12/08/21 0227 12/09/21 0121 12/12/21 0203  AST 14* 14* 27  ALT 14 16 25   ALKPHOS 89 88 119  BILITOT 0.9 0.1* 0.6  PROT 5.8* 5.7* 6.1*  ALBUMIN 2.4* 2.1* 2.1*   No results for input(s): LIPASE, AMYLASE in the last 168 hours. No results for input(s): AMMONIA in the last 168 hours.  ABG No results found for: PHART, PCO2ART, PO2ART, HCO3, TCO2, ACIDBASEDEF, O2SAT   Coagulation Profile: No  results for input(s): INR, PROTIME in the last 168 hours.   Cardiac Enzymes: No results for input(s): CKTOTAL, CKMB, CKMBINDEX, TROPONINI in the last 168 hours.  HbA1C: Hgb A1c MFr Bld  Date/Time Value Ref Range Status  01/20/2019 08:42 AM 5.5 <5.7 % of total Hgb Final    Comment:    For the purpose of screening for the presence of diabetes: . <5.7%       Consistent with the absence of diabetes 5.7-6.4%    Consistent with increased risk for diabetes             (prediabetes) > or =6.5%  Consistent with diabetes . This assay result is consistent with a decreased risk of diabetes. . Currently, no consensus exists regarding use of hemoglobin A1c for diagnosis of diabetes in children. . According to American Diabetes Association (ADA) guidelines, hemoglobin A1c <7.0% represents optimal control in non-pregnant diabetic patients. Different metrics may apply to specific patient populations.  Standards of Medical Care in Diabetes(ADA). Marland Kitchen  09/12/2015 08:17 AM 5.5 4.8 - 5.6 % Final    Comment:             Pre-diabetes: 5.7 - 6.4          Diabetes: >6.4          Glycemic control for adults with diabetes: <7.0     CBG: No results for input(s): GLUCAP in the last 168 hours.  Critical care time: NA   Freda Jackson, MD [facility] Office: [phone]   See Amion for personal pager PCCM on call pager [phone] until 7pm. Please call Elink 7p-7a. [phone]

## 2021-12-13 NOTE — Telephone Encounter (Signed)
Appt has been scheduled. Stat CXR has been ordered.

## 2021-12-13 NOTE — Progress Notes (Signed)
Patient refused CPAP for the night.  RN placed patient on [place]. VSS.  RT will continue to monitor.

## 2021-12-13 NOTE — Telephone Encounter (Signed)
Please schedule patient for hospital follow up on 12/21 at 4:30pm with chest x-ray for pneumonia and pleural effusion.   Thanks, JD

## 2021-12-13 NOTE — Progress Notes (Signed)
HD#7 SUBJECTIVE:  Interm History: patient seen and evaluated. He is sitting up in his chair at bedside. He states he feels much better, but does not feel comfortable going home yet. He lives with girlfriend who would not be able to adequately care for him.   OBJECTIVE:  Vital Signs: Vitals:   12/13/21 0008 12/13/21 0354 12/13/21 0741 12/13/21 1208  BP: 134/77 132/72 125/74 128/86  Pulse: 91 90 80 63  Resp: (!) 25 20 20  (!) 21  Temp: 98 F (36.7 C) 98.3 F (36.8 C)    TempSrc: Oral Oral    SpO2: 96% 94% 94% 93%  Weight:      Height:       Supplemental O2: Nasal Cannula SpO2: 93 % O2 Flow Rate (L/min): 4 L/min FiO2 (%): 32 %  Filed Weights   12/07/21 1355  Weight: 91.1 kg     Intake/Output Summary (Last 24 hours) at 12/13/2021 1448 Last data filed at 12/12/2021 2200 Gross per 24 hour  Intake 120 ml  Output 1200 ml  Net -1080 ml   Net IO Since Admission: -3,759.96 mL [12/13/21 1448]  Physical Exam: Physical Exam Constitutional:      General: He is not in acute distress.    Appearance: He is well-developed. He is obese.  HENT:     Head: Normocephalic and atraumatic.  Eyes:     Extraocular Movements: Extraocular movements intact.     Pupils: Pupils are equal, round, and reactive to light.  Cardiovascular:     Rate and Rhythm: Normal rate and regular rhythm.  Pulmonary:     Effort: No respiratory distress.     Breath sounds: Examination of the right-lower field reveals rales. Examination of the left-lower field reveals rales. Rales present.     Comments: Breath sound appreciated throughout all lung fields. Crackles at lung bases R>L, improving Chest:     Chest wall: No mass or edema.  Abdominal:     General: Bowel sounds are normal.     Palpations: Abdomen is soft.     Tenderness: There is no abdominal tenderness.  Musculoskeletal:        General: Normal range of motion.     Right lower leg: No edema.     Left lower leg: No edema.  Skin:    General:  Skin is warm.  Neurological:     General: No focal deficit present.     Mental Status: He is alert and oriented to person, place, and time.  Psychiatric:        Mood and Affect: Mood normal.        Behavior: Behavior normal.    Pertinent Labs: CBC Latest Ref Rng & Units 12/13/2021 12/12/2021 12/11/2021  WBC 4.0 - 10.5 K/uL 13.2(H) 14.1(H) 15.5(H)  Hemoglobin 13.0 - 17.0 g/dL 11.9(L) 12.2(L) 12.4(L)  Hematocrit 39.0 - 52.0 % 36.2(L) 36.8(L) 36.5(L)  Platelets 150 - 400 K/uL 397 321 349    CMP Latest Ref Rng & Units 12/13/2021 12/12/2021 12/11/2021  Glucose 70 - 99 mg/dL 122(H) 109(H) 113(H)  BUN 8 - 23 mg/dL 8 9 7(L)  Creatinine 0.61 - 1.24 mg/dL 0.73 0.71 0.73  Sodium 135 - 145 mmol/L 133(L) 134(L) 133(L)  Potassium 3.5 - 5.1 mmol/L 3.7 3.5 3.4(L)  Chloride 98 - 111 mmol/L 98 98 96(L)  CO2 22 - 32 mmol/L 26 24 25   Calcium 8.9 - 10.3 mg/dL 8.8(L) 8.6(L) 8.8(L)  Total Protein 6.5 - 8.1 g/dL - 6.1(L) -  Total  Bilirubin 0.3 - 1.2 mg/dL - 0.6 -  Alkaline Phos 38 - 126 U/L - 119 -  AST 15 - 41 U/L - 27 -  ALT 0 - 44 U/L - 25 -    No results for input(s): GLUCAP in the last 72 hours.    ASSESSMENT/PLAN:  Assessment: Principal Problem:   CAP (community acquired pneumonia) Active Problems:   Hypertension   Obese   Chronic bronchitis ([place])   Pleural effusion   Rodney Foster is a 71 y.o. with pertinent PMH of chronic bronchitis, OSA on CPAP, HTN, HLD, gout who presented with progressive dyspnea and cough and admit for community acquired pneumonia on hospital day 7. He was initially treated with IV Rocephin for CAP complicated by effusion and received 6/7 days of IV antibiotics with subjective improvement in breathing, however, due to slow progression with continued oxygen requirement of 3L and consistently elevated WBC, pulm was consulted for further assistance. He was started on a course of azithromycin and appears to be improving.    Plan: #community acquired pneumonia,  complicated by small pleural effusion  Appears to be making improvement each day. He is sitting up in chair at bedside. Not requiring supplemental O2 at rest, however, patient does desat to 80's with talking and exertion. Will plan to check ambulatory sats tomorrow. He may need supplemental oxygen on discharge.    - plan to continue Ceftriaxone and azithromycin. Will start him on 1 week course of Augmentin upon discharge. - inhaler PRN but would caution overuse given tachycardia. Acapella valve and incentive spirometer also available.  -O2 as needed to maintain oxygen saturations greater than 94%. -Monitor fever curve, daily CBCs -Encourage CPAP use - patient will need to follow up with [facility] pulmonology in 1 week to ensure resolution of PNA.   #Chronic HFmrEF Echo ordered yesterday shows mildly reduced EF to 45-50%. No history of heart disease. There is some mild cavity dilation and Grade 1 diastolic dysfunction. Patient appears euvolemic on exam today, he has tolerated IV fluid well during this admission. EF may be decreased in the setting of this acute illness. Will plan to restart losartan at discharge and add low dose beta blocker and statin. Can repeat echo in three months to see if EF recovers.  - currently on statin. Will plan to start low dose BB upon discharge.    #alcohol use disorder Significant daily alcohol intake.  No withdrawal symptoms this admission.  - monitor.  OSA on CPAP -Continue CPAP as tolerated  HTN - blood pressure has remained stable  HLD - continue statin  Gout - monitor.  Jerrye Bushy - protonix  Best Practice: Diet: Regular diet IVF: Fluids: LR VTE: enoxaparin (LOVENOX) injection 40 mg Start: 12/06/21 1800 Code: Full AB: ceftriaxone 1g Q24hr  DISPO: Anticipated discharge in 1-2 days to Home pending Medical stability.  Signature: Delene Ruffini, MD  Internal Medicine Resident, PGY-1 [facility]  Pager:  [phone] 2:48 PM, 12/13/2021   Please contact the on call pager after 5 pm and on weekends at [phone].

## 2021-12-14 ENCOUNTER — Other Ambulatory Visit (HOSPITAL_COMMUNITY): Payer: Self-pay

## 2021-12-14 LAB — BASIC METABOLIC PANEL
Anion gap: 9 (ref 5–15)
BUN: 10 mg/dL (ref 8–23)
CO2: 26 mmol/L (ref 22–32)
Calcium: 8.6 mg/dL — ABNORMAL LOW (ref 8.9–10.3)
Chloride: 99 mmol/L (ref 98–111)
Creatinine, Ser: 0.71 mg/dL (ref 0.61–1.24)
GFR, Estimated: >60 mL/min (ref >60)
Glucose, Bld: 112 mg/dL — ABNORMAL HIGH (ref 70–99)
Potassium: 3.9 mmol/L (ref 3.5–5.1)
Sodium: 134 mmol/L — ABNORMAL LOW (ref 135–145)

## 2021-12-14 LAB — CBC
HCT: 35.5 % — ABNORMAL LOW (ref 39.0–52.0)
Hemoglobin: 11.7 g/dL — ABNORMAL LOW (ref 13.0–17.0)
MCH: 31.5 pg (ref 26.0–34.0)
MCHC: 33 g/dL (ref 30.0–36.0)
MCV: 95.7 fL (ref 80.0–100.0)
Platelets: 432 10*3/uL — ABNORMAL HIGH (ref 150–400)
RBC: 3.71 MIL/uL — ABNORMAL LOW (ref 4.22–5.81)
RDW: 12.2 % (ref 11.5–15.5)
WBC: 12.1 10*3/uL — ABNORMAL HIGH (ref 4.0–10.5)
nRBC: 0 % (ref 0.0–0.2)

## 2021-12-14 MED ORDER — FLUTICASONE PROPIONATE 50 MCG/ACT NA SUSP
1.0000 | Freq: Every day | NASAL | 0 refills | Status: DC
  Filled 2021-12-14: qty 16, 30d supply, fill #0

## 2021-12-14 MED ORDER — LORATADINE 10 MG PO TABS
10.0000 mg | ORAL_TABLET | Freq: Every day | ORAL | 0 refills | Status: DC
  Filled 2021-12-14: qty 30, 30d supply, fill #0

## 2021-12-14 MED ORDER — HYDROCODONE-ACETAMINOPHEN 5-325 MG PO TABS: 1.0000 | ORAL_TABLET | Freq: Three times a day (TID) | ORAL | 0 refills | Status: AC | PRN

## 2021-12-14 MED ORDER — AMOXICILLIN-POT CLAVULANATE 875-125 MG PO TABS
1.0000 | ORAL_TABLET | Freq: Two times a day (BID) | ORAL | 0 refills | Status: DC
  Filled 2021-12-14: qty 14, 7d supply, fill #0

## 2021-12-14 NOTE — Progress Notes (Signed)
°   12/14/21 1448  AVS Discharge Documentation  AVS Discharge Instructions Including Medications Provided to patient/caregiver  Name of Person Receiving AVS Discharge Instructions Including Medications Rodney Foster  Name of Clinician That Reviewed AVS Discharge Instructions Including Medications Venida Jarvis, RN

## 2021-12-14 NOTE — TOC Transition Note (Signed)
Transition of Care [facility]) - CM/SW Discharge Note   Patient Details  Name: Rodney Foster MRN: [id number] Date of Birth: 04/24/1950  [facility] CM/SW Contact:  Cyndi Bender, RN Phone Number: 12/14/2021, 1:56 PM   Clinical Narrative:    Patient is stable for discharge.Patient has transportation home.  No needs   Final next level of care: Home/Self Care Barriers to Discharge: Barriers Resolved   Patient Goals and CMS Choice Patient states their goals for this hospitalization and ongoing recovery are:: return home      Discharge Placement                 home      Discharge Plan and Services      home                      Social Determinants of Health (SDOH) Interventions     Readmission Risk Interventions Readmission Risk Prevention Plan 12/14/2021  Transportation Screening Complete  PCP or Specialist Appt within 5-7 Days Complete  Medication Review (RN CM) Complete  Some recent data might be hidden

## 2021-12-14 NOTE — Discharge Summary (Signed)
Name: Rodney Foster MRN: [id number] DOB: 12/28/1950 71 y.o. PCP: [name], NP  Date of Admission: 12/06/2021  8:26 AM Date of Discharge: 12/14/21 Attending Physician: Dr. Evette Doffing  Discharge Diagnosis: Principal Problem:   CAP (community acquired pneumonia) Active Problems:   Hypertension   Obese   Chronic bronchitis ([place])   Pleural effusion    Discharge Medications: Allergies as of 12/14/2021   No Known Allergies      Medication List     STOP taking these medications    acetaminophen 500 MG tablet Commonly known as: TYLENOL   aspirin 325 MG tablet   gabapentin 100 MG capsule Commonly known as: Neurontin   methocarbamol 500 MG tablet Commonly known as: Robaxin   tiZANidine 4 MG tablet Commonly known as: ZANAFLEX       TAKE these medications    acyclovir 400 MG tablet Commonly known as: ZOVIRAX TAKE 1 TABLET BY MOUTH EVERY DAY   amLODipine 5 MG tablet Commonly known as: NORVASC TAKE ONE TABLET BY MOUTH ONCE DAILY TO CONTROL BLOOD PRESSURE What changed: See the new instructions.   amoxicillin-clavulanate 875-125 MG tablet Commonly known as: Augmentin Take 1 tablet by mouth 2 (two) times daily for 7 days.   ANTIOXIDANT PO Take 30 mLs by mouth daily. Aronia Berry Juice   atorvastatin 20 MG tablet Commonly known as: LIPITOR TAKE 1 TABLET BY MOUTH EVERYDAY AT BEDTIME What changed: See the new instructions.   febuxostat 40 MG tablet Commonly known as: ULORIC Take 1 tablet (40 mg total) by mouth daily.   fluticasone 50 MCG/ACT nasal spray Commonly known as: FLONASE Place 1 spray into both nostrils daily.   guaiFENesin 600 MG 12 hr tablet Commonly known as: MUCINEX Take 600-1,200 mg by mouth at bedtime as needed for cough or to loosen phlegm.   HYDROcodone-acetaminophen 5-325 MG tablet Commonly known as: NORCO/VICODIN Take 1 tablet by mouth every 8 (eight) hours as needed for moderate pain.   loratadine 10 MG tablet Commonly  known as: CLARITIN Take 1 tablet (10 mg total) by mouth daily.   losartan 100 MG tablet Commonly known as: COZAAR TAKE ONE BY MOUTH DAILY FOR BLOOD PRESSURE CONTROL What changed:  how much to take how to take this when to take this additional instructions   omeprazole 20 MG capsule Commonly known as: PRILOSEC Take 20 mg by mouth daily.   tadalafil 20 MG tablet Commonly known as: CIALIS Take 1 tablet (20 mg total) by mouth daily as needed for erectile dysfunction.   zolpidem 5 MG tablet Commonly known as: AMBIEN Take one tablet by mouth at bedtime as needed for sleep What changed:  how much to take how to take this when to take this reasons to take this additional instructions        Disposition and follow-up:   Mr.Justan D Wolbert was discharged from [facility] in Stable condition.  At the hospital follow up visit please address:  1.  Follow-up:  a. Community acquired pneumonia, complicated by pleural effusion - pt to be discharged with course of Augmentin, ensure resolution of symptoms. He is to follow up with [facility] pulmonology for repeat CXR.     b. Chronic HFmrEF - follow up with PCP for repeat ECHO in 3 months.   c. Alcohol use disorder    d. OSA -on CPAP  2.  Labs / imaging needed at time of follow-up: cbc, cmp  3.  Pending labs/ test needing follow-up: none  4.  Medication Changes  Started: Flonase, Claritin  Stopped: none  Changed: none  Abx -  Augmentin 875 BID for 7 days End Date: 12/22/21  Follow-up Appointments:  Follow-up Information     [facility]. Schedule an appointment as soon as possible for a visit in 6 day(s).   Specialty: Pulmonology Why: Follow up appointment at 4:30pm for CXR. Contact information: [address]  [place] [id number] [place] [facility] Course by problem list:  Community acquired pneumonia, complicated by small pleural effusion -  patient was admitted with pneumonia, started on ceftriaxone and supplemental oxygen. Due to tachycardia and tachypnea, CTA was obtained that showed effusion as well. He was slow to improve, requiring continued 3-4 L Benjamin Perez. He was started on other supportive measures including albuterol and flutter valves with minimal benefit. Pulmonology was consulted and initiated Azithromycin, with more noticeable improvement over the following 4 days. He was able to be weaned off supplemental O2 and deemed stable for discharge home with a 7 day course of Augmentin.   Alcohol use disorder - he was monitored with CIWA but did not require ativan.      Discharge Subjective: Patient reports he feels much better. Cough and pain resolved.   Discharge Exam:   BP 137/74 (BP Location: Right Arm)    Pulse 92    Temp 98 F (36.7 C) (Oral)    Resp (!) 22    Ht 5\' 10"  (1.778 m)    Wt 91.1 kg    SpO2 93%    BMI 28.82 kg/m  Constitutional: well-appearing male sitting in bed, in no acute distress HENT: normocephalic atraumatic, mucous membranes moist Eyes: conjunctiva non-erythematous Neck: supple Cardiovascular: regular rate and rhythm, no m/r/g Pulmonary/Chest: normal work of breathing on room air, lungs clear to auscultation bilaterally Abdominal: soft, non-tender, non-distended MSK: normal bulk and tone Neurological: alert & oriented x 3, 5/5 strength in bilateral upper and lower extremities, normal gait Skin: warm and dry Psych: mood and affect appropriate   Pertinent Labs, Studies, and Procedures:  CBC Latest Ref Rng & Units 12/14/2021 12/13/2021 12/12/2021  WBC 4.0 - 10.5 K/uL 12.1(H) 13.2(H) 14.1(H)  Hemoglobin 13.0 - 17.0 g/dL 11.7(L) 11.9(L) 12.2(L)  Hematocrit 39.0 - 52.0 % 35.5(L) 36.2(L) 36.8(L)  Platelets 150 - 400 K/uL 432(H) 397 321    CMP Latest Ref Rng & Units 12/14/2021 12/13/2021 12/12/2021  Glucose 70 - 99 mg/dL 112(H) 122(H) 109(H)  BUN 8 - 23 mg/dL 10 8 9   Creatinine 0.61 - 1.24 mg/dL 0.71  0.73 0.71  Sodium 135 - 145 mmol/L 134(L) 133(L) 134(L)  Potassium 3.5 - 5.1 mmol/L 3.9 3.7 3.5  Chloride 98 - 111 mmol/L 99 98 98  CO2 22 - 32 mmol/L 26 26 24   Calcium 8.9 - 10.3 mg/dL 8.6(L) 8.8(L) 8.6(L)  Total Protein 6.5 - 8.1 g/dL - - 6.1(L)  Total Bilirubin 0.3 - 1.2 mg/dL - - 0.6  Alkaline Phos 38 - 126 U/L - - 119  AST 15 - 41 U/L - - 27  ALT 0 - 44 U/L - - 25    DG Chest 1 View  Result Date: 12/06/2021 CLINICAL DATA:  Right chest pain and shortness of breath EXAM: CHEST  1 VIEW COMPARISON:  12/05/2021 FINDINGS: Lower lung volumes with increased bibasilar atelectasis versus developing pneumonia or airspace process. Trace right effusion suspected. Normal heart size and vascularity. No  edema pattern or CHF. Negative for pneumothorax. Trachea midline. Remote left posterior rib fractures evident. Degenerative changes of the right shoulder. IMPRESSION: Lower lung volumes with worsening bibasilar atelectasis versus developing pneumonia. Trace right pleural effusion. Electronically Signed   By: Jerilynn Mages.  Shick M.D.   On: 12/06/2021 10:47   CT Angio Chest Pulmonary Embolism (PE) W or WO Contrast  Result Date: 12/07/2021 CLINICAL DATA:  Positive D-dimer, pulmonary embolism suspected. EXAM: CT ANGIOGRAPHY CHEST WITH CONTRAST TECHNIQUE: Multidetector CT imaging of the chest was performed using the standard protocol during bolus administration of intravenous contrast. Multiplanar CT image reconstructions and MIPs were obtained to evaluate the vascular anatomy. CONTRAST:  14mL OMNIPAQUE IOHEXOL 350 MG/ML SOLN COMPARISON:  12/06/2021. FINDINGS: Cardiovascular: The heart is normal in size and there is no pericardial effusion. There is mild atherosclerotic calcification of the aorta without evidence of aneurysm. Aberrant origin of the right subclavian artery is noted which courses posterior to the esophagus. The pulmonary trunk is normal in caliber. No definite evidence of pulmonary artery filling defect.  Evaluation of the segmental and subsegmental arteries is limited due to mixing artifact, respiratory motion, and infiltrates. Mediastinum/Nodes: Enlarged lymph nodes are present at the right hilum and there is bronchial wall thickening on the right. Shotty lymph nodes are noted in the mediastinum. No axillary lymphadenopathy is seen. The thyroid gland, trachea, and esophagus are within normal limits. A small hiatal hernia is noted. Lungs/Pleura: There is bronchial wall thickening on the right with narrowing of the right lower lobe bronchus with right lower lobe consolidation. Mild consolidation is seen in the right middle lobe and lingular segment of the left upper lobe. A small to moderate right pleural effusion is seen. There is mild consolidation in the left lower lobe with areas of mucous plugging. No pneumothorax is seen bilaterally. Upper Abdomen: No acute abnormality. Musculoskeletal: No acute osseous abnormality. Review of the MIP images confirms the above findings. IMPRESSION: 1. No definite evidence of pulmonary embolism. Evaluation of the segmental and subsegmental arteries at the lung bases is limited due to mixing artifact, respiratory motion, and infiltrates. The need for repeat evaluation should be determined clinically. 2. Bilateral lower lobe consolidation and consolidation in the right middle lobe and lingular segment of the left upper lobe, increased from the prior exam and concerning for pneumonia. There is bronchial wall thickening and prominent lymph nodes in the hilar region on the right. Follow-up is recommended until resolution to exclude the possibility of underlying neoplasm. 3. Small to moderate right pleural effusion, increased in size from the prior exam. 4. Remaining chronic findings as described above. Electronically Signed   By: Brett Fairy M.D.   On: 12/07/2021 00:55   CT CHEST ABDOMEN PELVIS W CONTRAST  Result Date: 12/06/2021 CLINICAL DATA:  Sepsis, right upper quadrant  pain, right chest pain EXAM: CT CHEST, ABDOMEN, AND PELVIS WITH CONTRAST TECHNIQUE: Multidetector CT imaging of the chest, abdomen and pelvis was performed following the standard protocol during bolus administration of intravenous contrast. CONTRAST:  156mL OMNIPAQUE IOHEXOL 350 MG/ML SOLN COMPARISON:  02/09/2016 CT angio chest. Lumbar spine series 07/08/2017. FINDINGS: CT CHEST FINDINGS Cardiovascular: Retroesophageal right subclavian artery. Scattered aortic calcifications. No aneurysm. Heart is normal size. Mediastinum/Nodes: No mediastinal, hilar, or axillary adenopathy. Trachea and esophagus are unremarkable. Thyroid unremarkable. Lungs/Pleura: Mucous plugging in the lower lobe airways bilaterally. Areas of consolidation noted in both lower lobes, right greater than left as well as the right middle lobe and lingula concerning for pneumonia. Trace right  pleural effusion. Musculoskeletal: Chest wall soft tissues are unremarkable. No acute bony abnormality CT ABDOMEN PELVIS FINDINGS Hepatobiliary: Small layering gallstones within the gallbladder. No focal hepatic abnormality. Pancreas: No focal abnormality or ductal dilatation. Spleen: No focal abnormality.  Normal size. Adrenals/Urinary Tract: No adrenal abnormality. No focal renal abnormality. No stones or hydronephrosis. Urinary bladder is unremarkable. Stomach/Bowel: Normal appendix. Stomach, large and small bowel grossly unremarkable. Vascular/Lymphatic: Aortic atherosclerosis. No evidence of aneurysm or adenopathy. Reproductive: No visible focal abnormality. Other: No free fluid or free air. Small bilateral inguinal hernias containing fat. Musculoskeletal: No acute bony abnormality. Chronic L1 compression fracture, stable since prior lumbar spine series 07/08/2017. IMPRESSION: Mucous plugging with airspace disease in both lower lobes, right worse than left as well as in the right middle lobe and lingula. Findings concerning for pneumonia. Trace right pleural  effusion. Cholelithiasis.  No CT evidence of acute cholecystitis. Aortic atherosclerosis. Electronically Signed   By: Rolm Baptise M.D.   On: 12/06/2021 14:09   CT L-SPINE NO CHARGE  Result Date: 12/06/2021 CLINICAL DATA:  Shortness of breath, right upper quadrant pain. Right-sided chest pain. EXAM: CT LUMBAR SPINE WITHOUT CONTRAST TECHNIQUE: Multidetector CT imaging of the lumbar spine was performed without intravenous contrast administration. Multiplanar CT image reconstructions were also generated. COMPARISON:  Radiograph dated November 14, 2021 and MRI examination dated November 23,2022. FINDINGS: Segmentation: 5 lumbar type vertebrae. Alignment: Normal. Vertebrae: Chronic compression deformity of L1 vertebral body with approximately 40% anterior vertebral body height loss, unchanged. Paraspinal and other soft tissues: Negative. Disc levels: Multilevel DA and disc disease with disc space narrowing and marginal osteophytes prominent at L 3-L4, L4-L5 with associated facet joint arthropathy. Others: Incidentally imaged abdominal viscera demonstrate no acute abnormality. IMPRESSION: 1. Chronic compression deformity of the L1 vertebral body, unchanged. 2. Multilevel degenerative disc disease with disc space narrowing, marginal osteophyte and associated facet joint arthropathy. Electronically Signed   By: Keane Police D.O.   On: 12/06/2021 14:13     Discharge Instructions: Discharge Instructions     Call MD for:  difficulty breathing, headache or visual disturbances   Complete by: As directed    Call MD for:  extreme fatigue   Complete by: As directed    Call MD for:  hives   Complete by: As directed    Call MD for:  persistant dizziness or light-headedness   Complete by: As directed    Call MD for:  persistant nausea and vomiting   Complete by: As directed    Call MD for:  redness, tenderness, or signs of infection (pain, swelling, redness, odor or green/yellow discharge around incision site)    Complete by: As directed    Call MD for:  severe uncontrolled pain   Complete by: As directed    Call MD for:  temperature >100.4   Complete by: As directed    Diet - low sodium heart healthy   Complete by: As directed    Increase activity slowly   Complete by: As directed      We treated you for pneumonia. We gave you a week of Ceftriaxone as well as a course of Azithromycin. Upon discharge, we will start you on a course of Augmentin. Please take this medication two times daily for 7 days. You will start the Augmentin tomorrow 12/16. Please also continue to take the Flonase and Claritin as well. Please also ensure that you follow up with Dr. Erin Fulling of [facility]. If you do not hear from their office,  please call them. Lastly, it will be important to follow up with your primary care physician. We also recommend that you continue using your CPAP machine at home when you sleep and ensure that it is properly cleaned after each use. We noticed that your heart function was slightly reduced, likely related to your acute infection, but we recommend having an 2D echocardiogram performed in approximately 3 months to ensure heart function has returned to normal.   Signed: Delene Ruffini, MD 12/14/2021, 2:57 PM   Pager: [phone]

## 2021-12-14 NOTE — Progress Notes (Signed)
SATURATION QUALIFICATIONS: (This note is used to comply with regulatory documentation for home oxygen)  Patient Saturations on Room Air at Rest =  96%  Patient Saturations on Room Air while Ambulating =  97%  Patient Saturations on 0 Liters of oxygen while Ambulating =  97%

## 2021-12-15 ENCOUNTER — Telehealth: Payer: Self-pay | Admitting: *Deleted

## 2021-12-15 NOTE — Telephone Encounter (Signed)
Transition Care Management Follow-up Telephone Call Date of discharge and from where: 12/14/2021 [facility] How have you been since you were released from the hospital? [facility] Any questions or concerns? No  Items Reviewed: Did the pt receive and understand the discharge instructions provided? Yes  Medications obtained and verified? Yes  Other? No  Any new allergies since your discharge? No  Dietary orders reviewed? Yes Do you have support at home? Yes   Home Care and Equipment/Supplies: Were home health services ordered? no If so, what is the name of the agency? na  Has the agency set up a time to come to the patient's home? not applicable Were any new equipment or medical supplies ordered?  No What is the name of the medical supply agency? na Were you able to get the supplies/equipment? not applicable Do you have any questions related to the use of the equipment or supplies? No  Functional Questionnaire: (I = Independent and D = Dependent) ADLs: I  Bathing/Dressing- I  Meal Prep- I  Eating- I  Maintaining continence- I  Transferring/Ambulation- I  Managing Meds- I  Follow up appointments reviewed:  PCP Hospital f/u appt confirmed? Yes  Scheduled to see Janett Billow 12/20/2021 @ 10:00 Specialist Hospital f/u appt confirmed? Yes  Scheduled to see Pulmonologist on 12/20/2021  Are transportation arrangements needed? No  If their condition worsens, is the pt aware to call PCP or go to the Emergency Dept.? Yes Was the patient provided with contact information for the PCP's office or ED? Yes Was to pt encouraged to call back with questions or concerns? Yes

## 2021-12-20 ENCOUNTER — Encounter: Payer: Self-pay | Admitting: Nurse Practitioner

## 2021-12-20 ENCOUNTER — Encounter: Payer: Self-pay | Admitting: Pulmonary Disease

## 2021-12-20 ENCOUNTER — Ambulatory Visit (INDEPENDENT_AMBULATORY_CARE_PROVIDER_SITE_OTHER): Payer: Medicare HMO

## 2021-12-20 ENCOUNTER — Ambulatory Visit: Payer: Medicare HMO | Admitting: Pulmonary Disease

## 2021-12-20 ENCOUNTER — Other Ambulatory Visit: Payer: Self-pay

## 2021-12-20 ENCOUNTER — Ambulatory Visit (INDEPENDENT_AMBULATORY_CARE_PROVIDER_SITE_OTHER): Payer: Medicare HMO | Admitting: Nurse Practitioner

## 2021-12-20 VITALS — BP 128/76 | HR 80 | Temp 97.9°F | Ht 70.0 in | Wt 200.0 lb

## 2021-12-20 VITALS — BP 122/70 | HR 76

## 2021-12-20 DIAGNOSIS — J189 Pneumonia, unspecified organism: Secondary | ICD-10-CM | POA: Diagnosis not present

## 2021-12-20 DIAGNOSIS — J9811 Atelectasis: Secondary | ICD-10-CM | POA: Diagnosis not present

## 2021-12-20 DIAGNOSIS — K648 Other hemorrhoids: Secondary | ICD-10-CM | POA: Diagnosis not present

## 2021-12-20 DIAGNOSIS — K5901 Slow transit constipation: Secondary | ICD-10-CM

## 2021-12-20 DIAGNOSIS — J90 Pleural effusion, not elsewhere classified: Secondary | ICD-10-CM

## 2021-12-20 DIAGNOSIS — M5441 Lumbago with sciatica, right side: Secondary | ICD-10-CM

## 2021-12-20 DIAGNOSIS — I10 Essential (primary) hypertension: Secondary | ICD-10-CM

## 2021-12-20 MED ORDER — AMOXICILLIN-POT CLAVULANATE 875-125 MG PO TABS: 1.0000 | ORAL_TABLET | Freq: Two times a day (BID) | ORAL | 0 refills | Status: AC

## 2021-12-20 NOTE — Progress Notes (Signed)
Synopsis: Referred in December 2022 for hospital follow up for pneumonia and pleural effusion.  Subjective:   PATIENT ID: Rodney Foster GENDER: male DOB: 08/05/1950, MRN: [id number]   HPI  Chief Complaint  Patient presents with   Hospitalization Follow-up    HFU with CXR.    Feliciano Wynter is a 71 year old male, former smoker with hypertension who is referred to pulmonary clinic for hospital follow up due to pneumonia and pleural effusion.   He was admitted 12/8 to 12/15 for pneumonia and right pleural effusion with new oxygen need.  Patient was initially treated with IV antibiotics with cefepime and vancomycin with transition to Augmentin therapy since discharge.  He had downtrending CRP, WBC count along with return to room air removing supplemental oxygen upon admission.  Given his serologic markers and clinical improvements he was discharged with close follow-up.  Chest radiograph today shows improvement of the right lung infiltrates and slight improvement of the right pleural effusion. The right effusion does appear to be loculated. He continues to have some discomfort of the right chest but this has also improved. He denies fevers, chills or sweats.   No chest tube or thoracentesis was performed during hospitalization as patient was clinically improving and there was not a clear window for procedure based on bedside ultrasound.  Past Medical History:  Diagnosis Date   Backache, unspecified    Bronchitis    Cough variant asthma 05/03/2017   Genital herpes    Gout    Hearing loss    Hemorrhoids    Hernia    Herpes simplex    Hyperglycemia    Hyperlipidemia    Hypertension    Impotence    Insomnia, unspecified    Osteoarthrosis, unspecified whether generalized or localized, unspecified site    Tinea pedis, right    Unspecified disorder of male genital organs      Family History  Problem Relation Age of Onset   COPD Mother    Aortic aneurysm Father      Social History    Socioeconomic History   Marital status: Divorced    Spouse name: Not on file   Number of children: Not on file   Years of education: Not on file   Highest education level: Not on file  Occupational History   Not on file  Tobacco Use   Smoking status: Former    Packs/day: 1.00    Years: 15.00    Pack years: 15.00    Types: Cigarettes    Quit date: 01/01/1984    Years since quitting: 37.9   Smokeless tobacco: Former    Quit date: 1990  Vaping Use   Vaping Use: Never used  Substance and Sexual Activity   Alcohol use: Yes    Alcohol/week: 6.0 - 7.0 standard drinks    Types: 6 - 7 Glasses of wine per week    Comment: 3-4 a night of wine or cocktail   Drug use: No   Sexual activity: Yes  Other Topics Concern   Not on file  Social History Narrative   Not on file   Social Determinants of Health   Financial Resource Strain: Not on file  Food Insecurity: Not on file  Transportation Needs: Not on file  Physical Activity: Not on file  Stress: Not on file  Social Connections: Not on file  Intimate Partner Violence: Not on file     No Known Allergies   Outpatient Medications Prior to Visit  Medication  Sig Dispense Refill   acyclovir (ZOVIRAX) 400 MG tablet TAKE 1 TABLET BY MOUTH EVERY DAY 90 tablet 1   amLODipine (NORVASC) 5 MG tablet TAKE ONE TABLET BY MOUTH ONCE DAILY TO CONTROL BLOOD PRESSURE 90 tablet 1   atorvastatin (LIPITOR) 20 MG tablet TAKE 1 TABLET BY MOUTH EVERYDAY AT BEDTIME 90 tablet 2   febuxostat (ULORIC) 40 MG tablet Take 1 tablet (40 mg total) by mouth daily. 90 tablet 1   fluticasone (FLONASE) 50 MCG/ACT nasal spray Place 1 spray into both nostrils daily. 16 g 0   guaiFENesin (MUCINEX) 600 MG 12 hr tablet Take 600-1,200 mg by mouth at bedtime as needed for cough or to loosen phlegm.     loratadine (CLARITIN) 10 MG tablet Take 1 tablet (10 mg total) by mouth daily. 30 tablet 0   losartan (COZAAR) 100 MG tablet TAKE ONE BY MOUTH DAILY FOR BLOOD PRESSURE  CONTROL 90 tablet 1   Multiple Vitamins-Minerals (ANTIOXIDANT PO) Take 30 mLs by mouth daily. Aronia Berry Juice     omeprazole (PRILOSEC) 20 MG capsule Take 20 mg by mouth daily.     tadalafil (CIALIS) 20 MG tablet Take 1 tablet (20 mg total) by mouth daily as needed for erectile dysfunction. 10 tablet 0   zolpidem (AMBIEN) 5 MG tablet Take one tablet by mouth at bedtime as needed for sleep 30 tablet 0   amoxicillin-clavulanate (AUGMENTIN) 875-125 MG tablet Take 1 tablet by mouth 2 (two) times daily for 7 days. 14 tablet 0   No facility-administered medications prior to visit.    Review of Systems  Constitutional:  Negative for chills, fever, malaise/fatigue and weight loss.  HENT:  Negative for congestion, sinus pain and sore throat.   Eyes: Negative.   Respiratory:  Positive for cough. Negative for hemoptysis, sputum production, shortness of breath and wheezing.   Cardiovascular:  Positive for chest pain (mild right sided). Negative for palpitations, orthopnea, claudication and leg swelling.  Gastrointestinal:  Negative for abdominal pain, heartburn, nausea and vomiting.  Genitourinary: Negative.   Musculoskeletal:  Negative for joint pain and myalgias.  Skin:  Negative for rash.  Neurological:  Negative for weakness.  Endo/Heme/Allergies: Negative.   Psychiatric/Behavioral: Negative.       Objective:   Vitals:   12/20/21 1635  BP: 122/70  Pulse: 76  SpO2: 96%     Physical Exam Constitutional:      General: He is not in acute distress. HENT:     Head: Normocephalic and atraumatic.  Eyes:     Extraocular Movements: Extraocular movements intact.     Conjunctiva/sclera: Conjunctivae normal.     Pupils: Pupils are equal, round, and reactive to light.  Cardiovascular:     Rate and Rhythm: Normal rate and regular rhythm.     Pulses: Normal pulses.     Heart sounds: Normal heart sounds. No murmur heard. Pulmonary:     Breath sounds: Examination of the right-lower field  reveals decreased breath sounds. Decreased breath sounds present.  Abdominal:     General: Bowel sounds are normal.     Palpations: Abdomen is soft.  Musculoskeletal:     Right lower leg: No edema.     Left lower leg: No edema.  Lymphadenopathy:     Cervical: No cervical adenopathy.  Skin:    General: Skin is warm and dry.  Neurological:     General: No focal deficit present.     Mental Status: He is alert.  Psychiatric:  Mood and Affect: Mood normal.        Behavior: Behavior normal.        Thought Content: Thought content normal.        Judgment: Judgment normal.    CBC    Component Value Date/Time   WBC 12.1 (H) 12/14/2021 0135   RBC 3.71 (L) 12/14/2021 0135   HGB 11.7 (L) 12/14/2021 0135   HCT 35.5 (L) 12/14/2021 0135   PLT 432 (H) 12/14/2021 0135   MCV 95.7 12/14/2021 0135   MCH 31.5 12/14/2021 0135   MCHC 33.0 12/14/2021 0135   RDW 12.2 12/14/2021 0135   LYMPHSABS 2.3 12/09/2021 0121   MONOABS 2.0 (H) 12/09/2021 0121   EOSABS 0.1 12/09/2021 0121   BASOSABS 0.0 12/09/2021 0121   BMP Latest Ref Rng & Units 12/14/2021 12/13/2021 12/12/2021  Glucose 70 - 99 mg/dL 112(H) 122(H) 109(H)  BUN 8 - 23 mg/dL 10 8 9   Creatinine 0.61 - 1.24 mg/dL 0.71 0.73 0.71  BUN/Creat Ratio 6 - 22 (calc) - - -  Sodium 135 - 145 mmol/L 134(L) 133(L) 134(L)  Potassium 3.5 - 5.1 mmol/L 3.9 3.7 3.5  Chloride 98 - 111 mmol/L 99 98 98  CO2 22 - 32 mmol/L 26 26 24   Calcium 8.9 - 10.3 mg/dL 8.6(L) 8.8(L) 8.6(L)   Chest imaging: CXR 12/20/21 1. Large loculated right pleural effusion, decreased in size from 12/13/2021, worrisome for empyema. 2. Right basilar airspace opacification may be due to pneumonia. 3. Streaky atelectasis in the left lower lobe.   PFT: No flowsheet data found.  Labs:  Path:  Echo 12/11/21: LV EF 45-50%. LV mildly dilated. Grade I diastolic dysfunction. RV size and function is normal.   Heart Catheterization:  Assessment & Plan:   Loculated pleural  effusion - Plan: amoxicillin-clavulanate (AUGMENTIN) 875-125 MG tablet, CT Chest W Contrast  Community acquired pneumonia, unspecified laterality  Discussion: Rodney Foster is a 71 year old male, former smoker with hypertension who is referred to pulmonary clinic for hospital follow up due to pneumonia and pleural effusion.   He has persistent pleural effusion on chest radiograph today which is concerning for loculated parapnemonic effusion vs empyema. We will extend his augmentin course for 14 more days. We will check CT chest with contrast to further evaluate the complicated right pleural space. Bedside US today was difficult in finding safe window for consideration of thoracentesis. He may have resolving dense pneumonia with small right effusion.  We will be in touch with patient once we have CT Chest scan results.   Freda Jackson, MD [facility] Office: [phone]    Current Outpatient Medications:    acyclovir (ZOVIRAX) 400 MG tablet, TAKE 1 TABLET BY MOUTH EVERY DAY, Disp: 90 tablet, Rfl: 1   amLODipine (NORVASC) 5 MG tablet, TAKE ONE TABLET BY MOUTH ONCE DAILY TO CONTROL BLOOD PRESSURE, Disp: 90 tablet, Rfl: 1   atorvastatin (LIPITOR) 20 MG tablet, TAKE 1 TABLET BY MOUTH EVERYDAY AT BEDTIME, Disp: 90 tablet, Rfl: 2   febuxostat (ULORIC) 40 MG tablet, Take 1 tablet (40 mg total) by mouth daily., Disp: 90 tablet, Rfl: 1   fluticasone (FLONASE) 50 MCG/ACT nasal spray, Place 1 spray into both nostrils daily., Disp: 16 g, Rfl: 0   guaiFENesin (MUCINEX) 600 MG 12 hr tablet, Take 600-1,200 mg by mouth at bedtime as needed for cough or to loosen phlegm., Disp: , Rfl:    loratadine (CLARITIN) 10 MG tablet, Take 1 tablet (10 mg total) by  mouth daily., Disp: 30 tablet, Rfl: 0   losartan (COZAAR) 100 MG tablet, TAKE ONE BY MOUTH DAILY FOR BLOOD PRESSURE CONTROL, Disp: 90 tablet, Rfl: 1   Multiple Vitamins-Minerals (ANTIOXIDANT PO), Take 30 mLs by mouth daily. Aronia Berry  Juice, Disp: , Rfl:    omeprazole (PRILOSEC) 20 MG capsule, Take 20 mg by mouth daily., Disp: , Rfl:    tadalafil (CIALIS) 20 MG tablet, Take 1 tablet (20 mg total) by mouth daily as needed for erectile dysfunction., Disp: 10 tablet, Rfl: 0   zolpidem (AMBIEN) 5 MG tablet, Take one tablet by mouth at bedtime as needed for sleep, Disp: 30 tablet, Rfl: 0   amoxicillin-clavulanate (AUGMENTIN) 875-125 MG tablet, Take 1 tablet by mouth 2 (two) times daily for 14 days., Disp: 28 tablet, Rfl: 0

## 2021-12-20 NOTE — Progress Notes (Signed)
Careteam: Patient Care Team: [name], NP as PCP - General (Geriatric Medicine)  PLACE OF SERVICE:  [facility] Directive information Does Patient Have a Medical Advance Directive?: Yes, Type of Advance Directive: Living will, Does patient want to make changes to medical advance directive?: No - Patient declined  No Known Allergies  Chief Complaint  Patient presents with   [facility] follow-up from recent visit 12/06/21-12/14/21.      HPI: Patient is a 71 y.o. male for hospital follow up.  Pt was admitted on 12/06/21-12/14/21 due to shortness of breath and hypoxica. Found to have pneumonia complicated by pleural effusion. He was discharged on Augmentin and almost completed course.  He was instructed to follow up with pulmonary and has appt today with Dr Erin Fulling pulmonary  He reports he got new CPAP but has not started yet.  He has had night sweats but this improved last night.  He was on 02 in the hospital and able to wean off in the hospital.   Reports his back pain has improved. He was in severe pain given prednisone and muscle relaxer. Caused him to have difficulty walking. He had follow up with orthopedic and recommended MRI which was obtained in the hospital. He will follow up with orthopedic once things settle but pain has improved. Taking hydrocodone- apap once daily in the afternoon which help.   Some constipation but this has improved.   He reports he does NOT want labs today and will not get them today. Reports labs taken every night in the hospital and over getting stuck.   Review of Systems:  Review of Systems  Constitutional:  Negative for chills, fever and weight loss.  HENT:  Negative for tinnitus.   Respiratory:  Negative for cough, sputum production and shortness of breath.   Cardiovascular:  Negative for chest pain, palpitations and leg swelling.  Gastrointestinal:  Negative for abdominal pain, constipation, diarrhea  and heartburn.  Genitourinary:  Negative for dysuria, frequency and urgency.  Musculoskeletal:  Positive for back pain. Negative for falls, joint pain and myalgias.  Skin: Negative.   Neurological:  Negative for dizziness and headaches.  Psychiatric/Behavioral:  Negative for depression and memory loss. The patient does not have insomnia.    Past Medical History:  Diagnosis Date   Backache, unspecified    Bronchitis    Cough variant asthma 05/03/2017   Genital herpes    Gout    Hearing loss    Hemorrhoids    Hernia    Herpes simplex    Hyperglycemia    Hyperlipidemia    Hypertension    Impotence    Insomnia, unspecified    Osteoarthrosis, unspecified whether generalized or localized, unspecified site    Tinea pedis, right    Unspecified disorder of male genital organs    Past Surgical History:  Procedure Laterality Date   fracture left femur  1984   ganglion repair of left forarm  2000   IVP  67   ML old comp fracture   KNEE SURGERY Right 01/21/2019   [place]   TONSILLECTOMY     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 His smoking use included cigarettes. He has a 15.00 pack-year smoking history. He quit smokeless tobacco use about 32 years ago. He reports current alcohol use of about 6.0 - 7.0 standard drinks per week. He reports that he does not use drugs.  Family History  Problem Relation Age of Onset   COPD Mother    Aortic aneurysm Father     Medications: Patient's Medications  New Prescriptions   No  medications on file  Previous Medications   ACYCLOVIR (ZOVIRAX) 400 MG TABLET    TAKE 1 TABLET BY MOUTH EVERY DAY   AMLODIPINE (NORVASC) 5 MG TABLET    TAKE ONE TABLET BY MOUTH ONCE DAILY TO CONTROL BLOOD PRESSURE   AMOXICILLIN-CLAVULANATE (AUGMENTIN) 875-125 MG TABLET    Take 1 tablet by mouth 2 (two) times daily for 7 days.   ATORVASTATIN (LIPITOR) 20 MG TABLET    TAKE 1 TABLET BY MOUTH EVERYDAY AT BEDTIME   FEBUXOSTAT (ULORIC) 40 MG TABLET    Take 1 tablet (40 mg total) by mouth daily.   FLUTICASONE (FLONASE) 50 MCG/ACT NASAL SPRAY    Place 1 spray into both nostrils daily.   GUAIFENESIN (MUCINEX) 600 MG 12 HR TABLET    Take 600-1,200 mg by mouth at bedtime as needed for cough or to loosen phlegm.   LORATADINE (CLARITIN) 10 MG TABLET    Take 1 tablet (10 mg total) by mouth daily.   LOSARTAN (COZAAR) 100 MG TABLET    TAKE ONE BY MOUTH DAILY FOR BLOOD PRESSURE CONTROL   MULTIPLE VITAMINS-MINERALS (ANTIOXIDANT PO)    Take 30 mLs by mouth daily. Aronia Berry Juice   OMEPRAZOLE (PRILOSEC) 20 MG CAPSULE    Take 20 mg by mouth daily.   TADALAFIL (CIALIS) 20 MG TABLET    Take 1 tablet (20 mg total) by mouth daily as needed for erectile dysfunction.   ZOLPIDEM (AMBIEN) 5 MG TABLET    Take one tablet by mouth at bedtime as needed for sleep  Modified Medications   No medications on file  Discontinued Medications   No medications on file    Physical Exam:  Vitals:   12/20/21 0958  BP: 128/76  Pulse: 80  Temp: 97.9 F (36.6 C)  TempSrc: Temporal  SpO2: 95%  Weight: 200 lb (90.7 kg)  Height: 5\' 10"  (1.778 m)   Body mass index is 28.7 kg/m. Wt Readings from Last 3 Encounters:  12/20/21 200 lb (90.7 kg)  12/07/21 200 lb 13.4 oz (91.1 kg)  12/05/21 210 lb (95.3 kg)    Physical Exam Constitutional:      General: He is not in acute distress.    Appearance: He is well-developed. He is not diaphoretic.  HENT:     Head: Normocephalic and atraumatic.     Right Ear: External ear normal.      Left Ear: External ear normal.     Mouth/Throat:     Pharynx: No oropharyngeal exudate.  Eyes:     Conjunctiva/sclera: Conjunctivae normal.     Pupils: Pupils are equal, round, and reactive to light.  Cardiovascular:     Rate and Rhythm: Normal rate and regular rhythm.     Heart sounds: Normal heart sounds.  Pulmonary:     Effort: Pulmonary effort is normal.     Breath sounds: Normal breath  sounds.  Abdominal:     General: Bowel sounds are normal.     Palpations: Abdomen is soft.  Musculoskeletal:        General: No tenderness.     Cervical back: Normal range of motion and neck supple.     Right lower leg: No edema.     Left lower leg: No edema.  Skin:    General: Skin is warm and dry.  Neurological:     Mental Status: He is alert and oriented to person, place, and time.    Labs reviewed: Basic Metabolic Panel: Recent Labs    12/12/21 0203 12/13/21 0107 12/14/21 0135  NA 134* 133* 134*  K 3.5 3.7 3.9  CL 98 98 99  CO2 24 26 26   GLUCOSE 109* 122* 112*  BUN 9 8 10   CREATININE 0.71 0.73 0.71  CALCIUM 8.6* 8.8* 8.6*   Liver Function Tests: Recent Labs    12/08/21 0227 12/09/21 0121 12/12/21 0203  AST 14* 14* 27  ALT 14 16 25   ALKPHOS 89 88 119  BILITOT 0.9 0.1* 0.6  PROT 5.8* 5.7* 6.1*  ALBUMIN 2.4* 2.1* 2.1*   No results for input(s): LIPASE, AMYLASE in the last 8760 hours. No results for input(s): AMMONIA in the last 8760 hours. CBC: Recent Labs    12/05/21 1925 12/06/21 1109 12/07/21 0358 12/09/21 0121 12/10/21 0039 12/12/21 0203 12/13/21 0107 12/14/21 0135  WBC 12.7* 18.4*   < > 16.5*   < > 14.1* 13.2* 12.1*  NEUTROABS 7.9* 15.1*  --  11.9*  --   --   --   --   HGB 13.4 14.6   < > 11.7*   < > 12.2* 11.9* 11.7*  HCT 40.6 44.0   < > 36.2*   < > 36.8* 36.2* 35.5*  MCV 98.1 95.4   < > 97.8   < > 95.3 95.8 95.7  PLT 384 384   < > 304   < > 321 397 432*   < > = values in this interval not displayed.   Lipid Panel: Recent Labs    04/10/21 0842  10/09/21 1340  CHOL 222* 194  HDL 65 72  LDLCALC 120* 102*  TRIG 244* 103  CHOLHDL 3.4 2.7   TSH: No results for input(s): TSH in the last 8760 hours. A1C: Lab Results  Component Value Date   HGBA1C 5.5 01/20/2019     Assessment/Plan 1. Community acquired pneumonia of right lower lobe of lung -clinically improving, has follow up with pulmonary this afternoon for follow up chest xray. He declines follow up lab work today. Continues on Augmentin and stressed importance of completing course.   2. Other hemorrhoids -stable, improving at this time.   3. Slow transit constipation Controlled with OTC and dietary modifications.   4. Essential hypertension Stable, continues on losartan  5. Pleural effusion -will have follow up chest xray today. Off O2, sats stable without worsening shortness of breath.   6. Acute right-sided low back pain with right-sided sciatica -improved at this time. Continues to follow up with orthopedic. Pain controlled oh hydrocodone-apap.    Next appt: 04/16/2022 Carlos American. [place], [facility] [phone]

## 2021-12-20 NOTE — Patient Instructions (Addendum)
We will check a CT Chest scan with contrast to evaluate your pleural effusion.   We will call you with the results and the next steps.

## 2021-12-21 ENCOUNTER — Ambulatory Visit
Admission: RE | Admit: 2021-12-21 | Discharge: 2021-12-21 | Disposition: A | Discharge status: Home / Self Care | Payer: Medicare HMO | Source: Ambulatory Visit | Attending: Pulmonary Disease | Admitting: Pulmonary Disease

## 2021-12-21 DIAGNOSIS — J9811 Atelectasis: Secondary | ICD-10-CM | POA: Diagnosis not present

## 2021-12-21 DIAGNOSIS — J8410 Pulmonary fibrosis, unspecified: Secondary | ICD-10-CM | POA: Diagnosis not present

## 2021-12-21 DIAGNOSIS — J90 Pleural effusion, not elsewhere classified: Secondary | ICD-10-CM

## 2021-12-21 MED ORDER — IOPAMIDOL (ISOVUE-300) INJECTION 61%
75.0000 mL | Freq: Once | INTRAVENOUS | Status: AC | PRN
  Administered 2021-12-21: 11:00:00 75 mL via INTRAVENOUS

## 2021-12-22 ENCOUNTER — Encounter (HOSPITAL_COMMUNITY): Payer: Self-pay | Admitting: Internal Medicine

## 2021-12-22 ENCOUNTER — Observation Stay (HOSPITAL_COMMUNITY): Payer: Medicare HMO

## 2021-12-22 ENCOUNTER — Inpatient Hospital Stay (HOSPITAL_COMMUNITY)
Admission: AD | Admit: 2021-12-22 | Discharge: 2021-12-26 | DRG: 187 | Disposition: A | Discharge status: Home / Self Care | Payer: Medicare HMO | Source: Ambulatory Visit | Attending: Internal Medicine | Admitting: Internal Medicine

## 2021-12-22 ENCOUNTER — Other Ambulatory Visit: Payer: Self-pay

## 2021-12-22 ENCOUNTER — Telehealth: Payer: Self-pay | Admitting: Pulmonary Disease

## 2021-12-22 DIAGNOSIS — Z9689 Presence of other specified functional implants: Secondary | ICD-10-CM

## 2021-12-22 DIAGNOSIS — Z8701 Personal history of pneumonia (recurrent): Secondary | ICD-10-CM

## 2021-12-22 DIAGNOSIS — I10 Essential (primary) hypertension: Secondary | ICD-10-CM | POA: Diagnosis present

## 2021-12-22 DIAGNOSIS — I517 Cardiomegaly: Secondary | ICD-10-CM | POA: Diagnosis not present

## 2021-12-22 DIAGNOSIS — M109 Gout, unspecified: Secondary | ICD-10-CM | POA: Diagnosis present

## 2021-12-22 DIAGNOSIS — J90 Pleural effusion, not elsewhere classified: Secondary | ICD-10-CM | POA: Diagnosis present

## 2021-12-22 DIAGNOSIS — E669 Obesity, unspecified: Secondary | ICD-10-CM | POA: Diagnosis present

## 2021-12-22 DIAGNOSIS — H9190 Unspecified hearing loss, unspecified ear: Secondary | ICD-10-CM | POA: Diagnosis present

## 2021-12-22 DIAGNOSIS — J869 Pyothorax without fistula: Secondary | ICD-10-CM | POA: Diagnosis not present

## 2021-12-22 DIAGNOSIS — Z96653 Presence of artificial knee joint, bilateral: Secondary | ICD-10-CM | POA: Diagnosis present

## 2021-12-22 DIAGNOSIS — Z87891 Personal history of nicotine dependence: Secondary | ICD-10-CM

## 2021-12-22 DIAGNOSIS — Z79899 Other long term (current) drug therapy: Secondary | ICD-10-CM

## 2021-12-22 DIAGNOSIS — J45991 Cough variant asthma: Secondary | ICD-10-CM | POA: Diagnosis present

## 2021-12-22 DIAGNOSIS — G4733 Obstructive sleep apnea (adult) (pediatric): Secondary | ICD-10-CM | POA: Diagnosis present

## 2021-12-22 DIAGNOSIS — I700 Atherosclerosis of aorta: Secondary | ICD-10-CM | POA: Diagnosis not present

## 2021-12-22 DIAGNOSIS — K649 Unspecified hemorrhoids: Secondary | ICD-10-CM | POA: Diagnosis present

## 2021-12-22 DIAGNOSIS — I959 Hypotension, unspecified: Secondary | ICD-10-CM | POA: Diagnosis not present

## 2021-12-22 DIAGNOSIS — Z825 Family history of asthma and other chronic lower respiratory diseases: Secondary | ICD-10-CM

## 2021-12-22 DIAGNOSIS — J189 Pneumonia, unspecified organism: Secondary | ICD-10-CM | POA: Diagnosis not present

## 2021-12-22 DIAGNOSIS — K219 Gastro-esophageal reflux disease without esophagitis: Secondary | ICD-10-CM | POA: Diagnosis present

## 2021-12-22 DIAGNOSIS — E785 Hyperlipidemia, unspecified: Secondary | ICD-10-CM | POA: Diagnosis present

## 2021-12-22 DIAGNOSIS — M1990 Unspecified osteoarthritis, unspecified site: Secondary | ICD-10-CM | POA: Diagnosis present

## 2021-12-22 DIAGNOSIS — J918 Pleural effusion in other conditions classified elsewhere: Secondary | ICD-10-CM | POA: Diagnosis not present

## 2021-12-22 DIAGNOSIS — A6002 Herpesviral infection of other male genital organs: Secondary | ICD-10-CM | POA: Diagnosis present

## 2021-12-22 DIAGNOSIS — J42 Unspecified chronic bronchitis: Secondary | ICD-10-CM | POA: Diagnosis present

## 2021-12-22 DIAGNOSIS — J439 Emphysema, unspecified: Secondary | ICD-10-CM | POA: Diagnosis not present

## 2021-12-22 DIAGNOSIS — Z20822 Contact with and (suspected) exposure to covid-19: Secondary | ICD-10-CM | POA: Diagnosis present

## 2021-12-22 DIAGNOSIS — J9811 Atelectasis: Secondary | ICD-10-CM | POA: Diagnosis present

## 2021-12-22 LAB — COMPREHENSIVE METABOLIC PANEL
ALT: 28 U/L (ref 0–44)
AST: 19 U/L (ref 15–41)
Albumin: 2.9 g/dL — ABNORMAL LOW (ref 3.5–5.0)
Alkaline Phosphatase: 124 U/L (ref 38–126)
Anion gap: 10 (ref 5–15)
BUN: 9 mg/dL (ref 8–23)
CO2: 26 mmol/L (ref 22–32)
Calcium: 9.1 mg/dL (ref 8.9–10.3)
Chloride: 97 mmol/L — ABNORMAL LOW (ref 98–111)
Creatinine, Ser: 0.78 mg/dL (ref 0.61–1.24)
GFR, Estimated: >60 mL/min (ref >60)
Glucose, Bld: 105 mg/dL — ABNORMAL HIGH (ref 70–99)
Potassium: 4.2 mmol/L (ref 3.5–5.1)
Sodium: 133 mmol/L — ABNORMAL LOW (ref 135–145)
Total Bilirubin: 0.5 mg/dL (ref 0.3–1.2)
Total Protein: 7.1 g/dL (ref 6.5–8.1)

## 2021-12-22 LAB — CBC
HCT: 38.9 % — ABNORMAL LOW (ref 39.0–52.0)
Hemoglobin: 12.7 g/dL — ABNORMAL LOW (ref 13.0–17.0)
MCH: 31.1 pg (ref 26.0–34.0)
MCHC: 32.6 g/dL (ref 30.0–36.0)
MCV: 95.1 fL (ref 80.0–100.0)
Platelets: 795 10*3/uL — ABNORMAL HIGH (ref 150–400)
RBC: 4.09 MIL/uL — ABNORMAL LOW (ref 4.22–5.81)
RDW: 12.1 % (ref 11.5–15.5)
WBC: 11.2 10*3/uL — ABNORMAL HIGH (ref 4.0–10.5)
nRBC: 0 % (ref 0.0–0.2)

## 2021-12-22 LAB — PROTIME-INR
INR: 1 (ref 0.8–1.2)
Prothrombin Time: 13.3 seconds (ref 11.4–15.2)

## 2021-12-22 LAB — LACTATE DEHYDROGENASE: LDH: 136 U/L (ref 98–192)

## 2021-12-22 LAB — SURGICAL PCR SCREEN
MRSA, PCR: NEGATIVE
Staphylococcus aureus: NEGATIVE

## 2021-12-22 MED ORDER — FLUTICASONE PROPIONATE 50 MCG/ACT NA SUSP
1.0000 | Freq: Every day | NASAL | Status: DC
  Administered 2021-12-24 – 2021-12-26 (×3): 1 via NASAL
  Filled 2021-12-22: qty 16

## 2021-12-22 MED ORDER — ACETAMINOPHEN 325 MG PO TABS: 650.0000 mg | ORAL_TABLET | Freq: Four times a day (QID) | ORAL | Status: DC | PRN

## 2021-12-22 MED ORDER — GUAIFENESIN ER 600 MG PO TB12
600.0000 mg | ORAL_TABLET | Freq: Two times a day (BID) | ORAL | Status: DC
  Administered 2021-12-22 – 2021-12-26 (×7): 600 mg via ORAL
  Filled 2021-12-22 (×7): qty 1

## 2021-12-22 MED ORDER — ENOXAPARIN SODIUM 40 MG/0.4ML IJ SOSY
40.0000 mg | PREFILLED_SYRINGE | INTRAMUSCULAR | Status: DC
  Administered 2021-12-23 – 2021-12-25 (×4): 40 mg via SUBCUTANEOUS
  Filled 2021-12-22 (×3): qty 0.4

## 2021-12-22 MED ORDER — ONDANSETRON HCL 4 MG PO TABS: 4.0000 mg | ORAL_TABLET | Freq: Four times a day (QID) | ORAL | Status: DC | PRN

## 2021-12-22 MED ORDER — LIDOCAINE HCL (PF) 1 % IJ SOLN
30.0000 mL | Freq: Once | INTRAMUSCULAR | Status: AC
  Administered 2021-12-22: 18:00:00 15 mL via INTRADERMAL

## 2021-12-22 MED ORDER — LIDOCAINE HCL (PF) 1 % IJ SOLN
INTRAMUSCULAR | Status: AC
  Filled 2021-12-22: qty 5

## 2021-12-22 MED ORDER — SODIUM CHLORIDE 0.9% FLUSH
3.0000 mL | Freq: Two times a day (BID) | INTRAVENOUS | Status: DC
  Administered 2021-12-22 – 2021-12-26 (×8): 3 mL via INTRAVENOUS

## 2021-12-22 MED ORDER — ATORVASTATIN CALCIUM 10 MG PO TABS
20.0000 mg | ORAL_TABLET | Freq: Every morning | ORAL | Status: DC
  Administered 2021-12-24 – 2021-12-26 (×3): 20 mg via ORAL
  Filled 2021-12-22 (×2): qty 2

## 2021-12-22 MED ORDER — MUPIROCIN 2 % EX OINT
1.0000 "application " | TOPICAL_OINTMENT | Freq: Two times a day (BID) | CUTANEOUS | Status: DC
  Administered 2021-12-22 – 2021-12-24 (×3): 1 via NASAL
  Filled 2021-12-22 (×2): qty 22

## 2021-12-22 MED ORDER — FEBUXOSTAT 40 MG PO TABS
40.0000 mg | ORAL_TABLET | Freq: Every day | ORAL | Status: DC
  Administered 2021-12-24 – 2021-12-26 (×3): 40 mg via ORAL
  Filled 2021-12-22 (×4): qty 1

## 2021-12-22 MED ORDER — SENNOSIDES-DOCUSATE SODIUM 8.6-50 MG PO TABS
1.0000 | ORAL_TABLET | Freq: Every evening | ORAL | Status: DC | PRN
  Administered 2021-12-24: 21:00:00 1 via ORAL
  Filled 2021-12-22: qty 1

## 2021-12-22 MED ORDER — ENSURE ENLIVE PO LIQD
237.0000 mL | Freq: Two times a day (BID) | ORAL | Status: DC
  Administered 2021-12-23 – 2021-12-26 (×5): 237 mL via ORAL

## 2021-12-22 MED ORDER — HEPARIN SODIUM (PORCINE) 5000 UNIT/ML IJ SOLN: 5000.0000 [IU] | Freq: Three times a day (TID) | INTRAMUSCULAR | Status: DC

## 2021-12-22 MED ORDER — ONDANSETRON HCL 4 MG/2ML IJ SOLN: 4.0000 mg | Freq: Four times a day (QID) | INTRAMUSCULAR | Status: DC | PRN

## 2021-12-22 MED ORDER — ACETAMINOPHEN 650 MG RE SUPP: 650.0000 mg | Freq: Four times a day (QID) | RECTAL | Status: DC | PRN

## 2021-12-22 MED ORDER — AMOXICILLIN-POT CLAVULANATE 875-125 MG PO TABS
1.0000 | ORAL_TABLET | Freq: Two times a day (BID) | ORAL | Status: DC
  Administered 2021-12-22 – 2021-12-26 (×7): 1 via ORAL
  Filled 2021-12-22 (×7): qty 1

## 2021-12-22 NOTE — Plan of Care (Signed)
°  Problem: Clinical Measurements: Goal: Respiratory complications will improve Outcome: Progressing Goal: Cardiovascular complication will be avoided Outcome: Progressing   Problem: Elimination: Goal: Will not experience complications related to bowel motility Outcome: Progressing   Problem: Pain Managment: Goal: General experience of comfort will improve Outcome: Progressing   

## 2021-12-22 NOTE — Telephone Encounter (Signed)
Spoke with the pt and he is agreeable to be admitted. I called and spoke with bed placement and advised pt with persistent pleural effusion needing chest tube placement by IR. Hospital service can be admitting provider. Called bed placement and gave these details. They will contact the pt when they have a bed ready at [facility]. Pt is aware.

## 2021-12-22 NOTE — H&P (Signed)
Date: 12/22/2021               Patient Name:  Rodney Foster MRN: [id number]  DOB: 19-Sep-1950 Age / Sex: 71 y.o., male   PCP: [name], NP         Medical Service: Internal Medicine Teaching Service         Attending Physician: Dr. Jimmye Norman, Elaina Pattee, MD    First Contact: Dr. Jeanice Lim Pager: [phone]  Second Contact: Dr. Lisabeth Devoid Pager: [phone]       After Hours (After 5p/  First Contact Pager: [phone]  weekends / holidays): Second Contact Pager: [phone]   Chief Complaint: SOB; abnormal imaging  History of Present Illness: 71 year old person living with obesity, chronic bronchitis, obstructive sleep apnea, hypertension, and recent hospitalization with CAP came in as a direct admit from pulmonologist. Patient endorses persistent productive coughing and night sweats.  Patient presented to his pulmonologist on 12/21/2021 for a hospital follow-up.  Imaging was obtained and concerning for loculated pleural effusion prompting direct admit.  Patient completed 7-day course of Augmentin from previous admission.  Pulmonologist continued Augmentin.  Patient denies chest pain, N/V, abdominal pain or lower extremity edema.  He endorsed mild SOB that has been improving since discharge from hospital on 12/14/2021.  Patient states he now has a CPAP machine at home and plans to use it once he goes home.  Meds:  Current Meds  Medication Sig   acetaminophen (TYLENOL) 500 MG tablet Take 1,000 mg by mouth every 6 (six) hours as needed for headache (pain).   acyclovir (ZOVIRAX) 400 MG tablet TAKE 1 TABLET BY MOUTH EVERY DAY (Patient taking differently: 400 mg every morning.)   amLODipine (NORVASC) 5 MG tablet TAKE ONE TABLET BY MOUTH ONCE DAILY TO CONTROL BLOOD PRESSURE (Patient taking differently: Take 5 mg by mouth every morning. to control blood pressure)   amoxicillin-clavulanate (AUGMENTIN) 875-125 MG tablet Take 1 tablet by mouth 2 (two) times daily for 14 days.   atorvastatin (LIPITOR) 20  MG tablet TAKE 1 TABLET BY MOUTH EVERYDAY AT BEDTIME (Patient taking differently: Take 20 mg by mouth every morning.)   febuxostat (ULORIC) 40 MG tablet Take 1 tablet (40 mg total) by mouth daily.   fluticasone (FLONASE) 50 MCG/ACT nasal spray Place 1 spray into both nostrils daily. (Patient taking differently: Place 1 spray into both nostrils at bedtime.)   guaiFENesin (MUCINEX) 600 MG 12 hr tablet Take 600 mg by mouth 2 (two) times daily.   HYDROcodone-acetaminophen (NORCO/VICODIN) 5-325 MG tablet Take 1 tablet by mouth every 6 (six) hours as needed for moderate pain.   loratadine (CLARITIN) 10 MG tablet Take 1 tablet (10 mg total) by mouth daily.   losartan (COZAAR) 100 MG tablet TAKE ONE BY MOUTH DAILY FOR BLOOD PRESSURE CONTROL (Patient taking differently: Take 100 mg by mouth every morning. FOR BLOOD PRESSURE CONTROL)   Multiple Vitamins-Minerals (ANTIOXIDANT PO) Take 30 mLs by mouth daily. Aronia Berry Juice   tadalafil (CIALIS) 20 MG tablet Take 1 tablet (20 mg total) by mouth daily as needed for erectile dysfunction.   zolpidem (AMBIEN) 5 MG tablet Take one tablet by mouth at bedtime as needed for sleep (Patient taking differently: Take 5 mg by mouth at bedtime.)     Allergies: Allergies as of 12/22/2021   (No Known Allergies)   Past Medical History:  Diagnosis Date   Backache, unspecified    Bronchitis    Cough variant asthma 05/03/2017   Genital herpes  Gout    Hearing loss    Hemorrhoids    Hernia    Herpes simplex    Hyperglycemia    Hyperlipidemia    Hypertension    Impotence    Insomnia, unspecified    Osteoarthrosis, unspecified whether generalized or localized, unspecified site    Tinea pedis, right    Unspecified disorder of male genital organs     Family History:  Family History  Problem Relation Age of Onset   COPD Mother    Aortic aneurysm Father      Social History:  Social History   Socioeconomic History   Marital status: Divorced    Spouse  name: Not on file   Number of children: Not on file   Years of education: Not on file   Highest education level: Not on file  Occupational History   Not on file  Tobacco Use   Smoking status: Former    Packs/day: 1.00    Years: 15.00    Pack years: 15.00    Types: Cigarettes    Quit date: 01/01/1984    Years since quitting: 38.0   Smokeless tobacco: Former    Quit date: 1990  Scientific laboratory technician Use: Never used  Substance and Sexual Activity   Alcohol use: Yes    Alcohol/week: 6.0 - 7.0 standard drinks    Types: 6 - 7 Glasses of wine per week    Comment: 3-4 a night of wine or cocktail   Drug use: No   Sexual activity: Yes  Other Topics Concern   Not on file  Social History Narrative   Not on file   Social Determinants of Health   Financial Resource Strain: Not on file  Food Insecurity: Not on file  Transportation Needs: Not on file  Physical Activity: Not on file  Stress: Not on file  Social Connections: Not on file  Intimate Partner Violence: Not on file   Review of Systems: A complete ROS was negative except as per HPI.   Physical Exam: Blood pressure 125/73, pulse 76, temperature 98.3 F (36.8 C), temperature source Oral, resp. rate 14, height 5\' 10"  (1.778 m), weight 89.6 kg, SpO2 95 %. Physical Exam Constitutional:      General: He is awake. He is not in acute distress. HENT:     Head: Normocephalic and atraumatic.  Cardiovascular:     Rate and Rhythm: Normal rate and regular rhythm.     Heart sounds: Normal heart sounds.  Pulmonary:     Effort: Pulmonary effort is normal.     Breath sounds: Normal air entry. Examination of the right-upper field reveals decreased breath sounds. Examination of the right-middle field reveals decreased breath sounds. Examination of the right-lower field reveals decreased breath sounds and rales. Examination of the left-lower field reveals rales. Decreased breath sounds and rales present.  Abdominal:     Palpations: Abdomen  is soft.     Tenderness: There is no abdominal tenderness.  Musculoskeletal:     Right lower leg: No edema.     Left lower leg: No edema.  Skin:    General: Skin is warm and dry.  Neurological:     General: No focal deficit present.     Mental Status: He is alert.  Psychiatric:        Behavior: Behavior normal. Behavior is cooperative.        Cognition and Memory: Cognition normal.     CT Chest W Contrast  Result Date:  12/21/2021 CLINICAL DATA: LOCULATED PLEURAL EFFUSION: CLINICAL DATA: LOCULATED PLEURAL EFFUSION Loculated pleural effusion/empyema EXAM: CT CHEST WITH CONTRAST TECHNIQUE: Multidetector CT imaging of the chest was performed during intravenous contrast administration. CONTRAST:  63mL ISOVUE-300 IOPAMIDOL (ISOVUE-300) INJECTION 61% COMPARISON:  CT examination dated December 7 and December 07, 2021. FINDINGS: Cardiovascular: No significant vascular findings. Normal heart size. No pericardial effusion. Mediastinum/Nodes: No enlarged mediastinal, hilar, or axillary lymph nodes. Thyroid gland, trachea, and esophagus demonstrate no significant findings. Lungs/Pleura: Large loculated pleural effusion in the right upper lobe. There is multiple area of subsegmental atelectasis and fibrosis in the right lower lobe. Left lung is clear. No pneumothorax. Upper Abdomen: No acute abnormality. Musculoskeletal: Superior endplate deformity of the T12 vertebral body. There is also small hypodense lesion in the left posterior aspect of the T10 vertebral body, likely hemangioma. IMPRESSION: 1. Large loculated right pleural effusion with multiple areas of atelectasis in the right lower lobe likely secondary to recent pneumonia. Left lung is clear. 2. Follow-up examination in 8-12 weeks is recommended for further evaluation. Electronically Signed   By: Keane Police D.O.   On: 12/21/2021 11:01      Assessment & Plan by Problem: Principal Problem:   Pleural effusion  #Pleural effusion #Chronic  Bronchitis Patient was a previously admitted 12/06/2021 for CAP.  Patient was started on ceftriaxone and supplemental oxygen.  Patient was then placed on azithromycin and weaned off supplemental oxygen.  Patient was discharged on 12/14/2021 with 7-day course of Augmentin.  Patient presented for hospital follow-up with pulmonologist; CT of the chest was obtained and concerning for loculated pleural effusion.  PCCM attempted pigtail catheter placement, unsuccessful.  However able to tap 30 cc of serous appearing fluid from right pleural space.  Pleural fluid sent for gram stain, culture, cell count, LDH glucose and protein.  Following this procedure, PCCM noted that patient may develop apical blebs that may be seen on future imaging, reassured that these findings are expected and not unusual.  Patient denies any pain or discomfort at this time.  He is satting well on room air.  IR is on board and will attempt pigtail catheter placement. --IR on board; recs appreciated --PCCM appreciated --Continue Augmentin 875-125 mg twice daily --Mucinex 600 mg twice daily --Flonase daily --Tylenol 650 every 6 hours as needed for mild pain --CMP and CBC tomorrow  #HLD - continue home atorvastatin 20   #HTN Patient normotensive on admission. - holding home amlodipine and losartan    #Gout -continue home febuxostat  #GERD -continue home pantoprazole   #OSA - continue CPAP nightly    Dispo: Admit patient to Inpatient with expected length of stay greater than 2 midnights.  Signed: Timothy Lasso, MD 12/22/2021, 6:35 PM  Pager: [phone] After 5pm on weekdays and 1pm on weekends: On Call pager: [phone]

## 2021-12-22 NOTE — Progress Notes (Signed)
RT NOTE:  Pt refuses CPAP tonight. He does wear at home. He states our equipment is not comfortable and he expects to be d/c home tomorrow. Pt is currently tolerating RA fine.

## 2021-12-22 NOTE — Procedures (Signed)
Thoracentesis  Procedure Note  Rodney Foster  [id number]  [birth year]  Date:12/22/21  Time:6:32 PM   Provider Performing:Addaleigh Nicholls Chauncey Cruel Shearon Stalls   Procedure: Thoracentesis with imaging guidance (81594)  Indication(s) Pleural Effusion  Consent Risks of the procedure as well as the alternatives and risks of each were explained to the patient and/or caregiver.  Consent for the procedure was obtained and is signed in the bedside chart  Anesthesia Topical only with 1% lidocaine    Time Out Verified patient identification, verified procedure, site/side was marked, verified correct patient position, special equipment/implants available, medications/allergies/relevant history reviewed, required imaging and test results available.   Sterile Technique Maximal sterile technique including full sterile barrier drape, hand hygiene, sterile gown, sterile gloves, mask, hair covering, sterile ultrasound probe cover (if used).  Procedure Description Ultrasound was used to identify appropriate pleural anatomy for placement and overlying skin marked.  Area of drainage cleaned and draped in sterile fashion. Lidocaine was used to anesthetize the skin and subcutaneous tissue.  30 cc's of serous appearing fluid was drained from the right pleural space. I did attempt pigtail catheter placement at this time but was not successful due to kinking of the wire and challenge of axillary anatomy.Catheter then removed and bandaid applied to site.   Complications/Tolerance None; patient tolerated the procedure well. Chest X-ray is ordered to confirm no post-procedural complication. I suspect there will be a small apical pneumothorax. No emergent intervention required for this overnight. This can be adequate drain with IR guided pigtail catheter placement.   EBL Minimal   Specimen(s) Pleural fluid sent for gramstain, culture, cell count, ldh glucose and protein.

## 2021-12-22 NOTE — Progress Notes (Signed)
Bedside chest tube attempted, but due to guidewire issues, the decision to have CT placed via IR was made. The patient also required more lidocaine than the kit came with. He received 15 ml of additional lidocaine. The patient also needed an IV placed and I was able to place a 20 g in his left forearm with 1 attempt.  °

## 2021-12-22 NOTE — Telephone Encounter (Signed)
Please let patient know that he has persistent pleural effusion that is along the right upper lung, this is why I wasn't able to see much on ultrasound in clinic as the fluid is high up on the right ride.   I recommend that he be admitted to the hospital for chest tube placement by interventional radiology.   If patient is amenable to this plan, please call bedboard/patient placement to place him on a direct admission list.   He can continue the augmentin antibiotic for now as planned.  Thanks, JD

## 2021-12-22 NOTE — Consult Note (Signed)
NAME:  Rodney Foster, MRN:  [id number], DOB:  Aug 21, 1950, LOS: 0 ADMISSION DATE:  12/22/2021, CONSULTATION DATE:  12/22/2021 REFERRING MD:  Dr. Jimmye Norman, CHIEF COMPLAINT:  Pleural effusion    History of Present Illness:  Rodney Foster is a 71 y.o. male with a PMH significant for former smoker and HTN who was directly admitted after an outpatient chest CT revealed worsened loculated pleural effusion. Rodney Foster was admitted at this facility from 12/8-12/15 for pneumonia and right pleural effusion with new supplemental oxygen demand. He was initially treated with Cefepime and Vancomycin with later transition to PO Augmentin. His clinical picture improved with conservative measures including ABT's. There was also no clear window for thoracentesis or chest tube placement.   Patient followed with with primary pulmonologist Dr. Erin Fulling on 12/21 and a repeat CXR in office was consist with a persistent right effusion. Decision was made to extent PO Augment and a repeated Chest CT was ordered.   Repeat chest CT was completed 12/22 and a large loculated pleural effusion was seen. Decision was made to direct admit patient for placement of chest tube to evacuate loculated effusion. PCCM consulted for pulmonary consult   Pertinent  Medical History  HTN  Former smoker   Significant Hospital Events: Including procedures, antibiotic start and stop dates in addition to other pertinent events   12/23 direct admit for loculated pleural effusion   Interim History / Subjective:  As above   Objective   Blood pressure 125/73, pulse 76, temperature 98.3 F (36.8 C), temperature source Oral, resp. rate 14, height 5\' 10"  (1.778 m), weight 89.6 kg, SpO2 95 %.       No intake or output data in the 24 hours ending 12/22/21 1643 Filed Weights   12/22/21 1623  Weight: 89.6 kg    Examination: General: Acute on chronically ill appearing elderly male lying in bed, in NAD HEENT: [place].AT, MM pink/moist, PERRL,  Neuro:  Alert and oriented x3, non-focal  CV: s1s2 regular rate and rhythm, no murmur, rubs, or gallops,  PULM:  Clear to ascultation, no increased work of breathing, no added breath sounds  GI: soft, bowel sounds active in all 4 quadrants, non-tender, non-distended, tolerating oral diet Extremities: warm/dry, no edema  Skin: no rashes or lesions  Resolved Hospital Problem list     Assessment & Plan:  Right loculated pleural effusion  -CT chest 12/23 > Large loculated right pleural effusion with multiple areas of atelectasis in the right lower lobe likely secondary to recent pneumonia. Left lung is clear. Severe obstructive sleep apnea  -Previously saw Dr. Ander Slade in 2019 and home sleep study revealed severe obstruction  P: Will need loculated effusion drained, will assess for pocket on bedside US if unable to identify safe window will consult IR Continue PO Augmenti  Supplemental oxygen for sat goal > 92 Mobilize as able  Nocturnal CPAP  Best Practice (right click and "Reselect all SmartList Selections" daily)  Per primary   Labs   CBC: No results for input(s): WBC, NEUTROABS, HGB, HCT, MCV, PLT in the last 168 hours.  Basic Metabolic Panel: No results for input(s): NA, K, CL, CO2, GLUCOSE, BUN, CREATININE, CALCIUM, MG, PHOS in the last 168 hours. GFR: Estimated Creatinine Clearance: 92.6 mL/min (by C-G formula based on SCr of 0.71 mg/dL). No results for input(s): PROCALCITON, WBC, LATICACIDVEN in the last 168 hours.  Liver Function Tests: No results for input(s): AST, ALT, ALKPHOS, BILITOT, PROT, ALBUMIN in the last 168 hours. No  results for input(s): LIPASE, AMYLASE in the last 168 hours. No results for input(s): AMMONIA in the last 168 hours.  ABG No results found for: PHART, PCO2ART, PO2ART, HCO3, TCO2, ACIDBASEDEF, O2SAT   Coagulation Profile: No results for input(s): INR, PROTIME in the last 168 hours.  Cardiac Enzymes: No results for input(s): CKTOTAL, CKMB, CKMBINDEX,  TROPONINI in the last 168 hours.  HbA1C: Hgb A1c MFr Bld  Date/Time Value Ref Range Status  01/20/2019 08:42 AM 5.5 <5.7 % of total Hgb Final    Comment:    For the purpose of screening for the presence of diabetes: . <5.7%       Consistent with the absence of diabetes 5.7-6.4%    Consistent with increased risk for diabetes             (prediabetes) > or =6.5%  Consistent with diabetes . This assay result is consistent with a decreased risk of diabetes. . Currently, no consensus exists regarding use of hemoglobin A1c for diagnosis of diabetes in children. . According to American Diabetes Association (ADA) guidelines, hemoglobin A1c <7.0% represents optimal control in non-pregnant diabetic patients. Different metrics may apply to specific patient populations.  Standards of Medical Care in Diabetes(ADA). Marland Kitchen   09/12/2015 08:17 AM 5.5 4.8 - 5.6 % Final    Comment:             Pre-diabetes: 5.7 - 6.4          Diabetes: >6.4          Glycemic control for adults with diabetes: <7.0     CBG: No results for input(s): GLUCAP in the last 168 hours.  Review of Systems:   Please see the history of present illness. All other systems reviewed and are negative   Past Medical History:  He,  has a past medical history of Backache, unspecified, Bronchitis, Cough variant asthma (05/03/2017), Genital herpes, Gout, Hearing loss, Hemorrhoids, Hernia, Herpes simplex, Hyperglycemia, Hyperlipidemia, Hypertension, Impotence, Insomnia, unspecified, Osteoarthrosis, unspecified whether generalized or localized, unspecified site, Tinea pedis, right, and Unspecified disorder of male genital organs.   Surgical History:   Past Surgical History:  Procedure Laterality Date   fracture left femur  1984   ganglion repair of left forarm  2000   IVP  67   ML old comp fracture   KNEE SURGERY Right 01/21/2019   Raliegh Ip    NASAL SEPTUM SURGERY  1997   TONSILLECTOMY  1959   TONSILLECTOMY     TOTAL  KNEE ARTHROPLASTY Right 09/25/2019   Procedure: TOTAL KNEE ARTHROPLASTY;  Surgeon: Earlie Server, MD;  Location: [facility];  Service: Orthopedics;  Laterality: Right;   TOTAL KNEE ARTHROPLASTY Left 05/06/2020   Procedure: TOTAL KNEE ARTHROPLASTY;  Surgeon: Earlie Server, MD;  Location: [facility];  Service: Orthopedics;  Laterality: Left;   UMBILICAL HERNIA REPAIR  2002     Social History:   reports that he quit smoking about 38 years ago. His smoking use included cigarettes. He has a 15.00 pack-year smoking history. He quit smokeless tobacco use about 32 years ago. He reports current alcohol use of about 6.0 - 7.0 standard drinks per week. He reports that he does not use drugs.   Family History:  His family history includes Aortic aneurysm in his father; COPD in his mother.   Allergies No Known Allergies   Home Medications  Prior to Admission medications   Medication Sig Start Date End Date Taking? Authorizing Provider  acetaminophen (TYLENOL) 500 MG tablet  Take 1,000 mg by mouth every 6 (six) hours as needed for headache (pain).   Yes [provider]  acyclovir (ZOVIRAX) 400 MG tablet TAKE 1 TABLET BY MOUTH EVERY DAY Patient taking differently: 400 mg every morning. 08/25/21  Yes Eubanks, Carlos American, NP  amLODipine (NORVASC) 5 MG tablet TAKE ONE TABLET BY MOUTH ONCE DAILY TO CONTROL BLOOD PRESSURE Patient taking differently: Take 5 mg by mouth every morning. to control blood pressure 08/25/21  Yes [name], NP  amoxicillin-clavulanate (AUGMENTIN) 875-125 MG tablet Take 1 tablet by mouth 2 (two) times daily for 14 days. 12/20/21 01/03/22 Yes Freddi Starr, MD  atorvastatin (LIPITOR) 20 MG tablet TAKE 1 TABLET BY MOUTH EVERYDAY AT BEDTIME Patient taking differently: Take 20 mg by mouth every morning. 10/30/21  Yes [name], NP  febuxostat (ULORIC) 40 MG tablet Take 1 tablet (40 mg total) by mouth daily. 03/09/20  Yes [name], NP  fluticasone (FLONASE) 50  MCG/ACT nasal spray Place 1 spray into both nostrils daily. Patient taking differently: Place 1 spray into both nostrils at bedtime. 12/14/21  Yes Iona Beard, MD  guaiFENesin (MUCINEX) 600 MG 12 hr tablet Take 600 mg by mouth 2 (two) times daily.   Yes [provider]  HYDROcodone-acetaminophen (NORCO/VICODIN) 5-325 MG tablet Take 1 tablet by mouth every 6 (six) hours as needed for moderate pain.   Yes [provider]  loratadine (CLARITIN) 10 MG tablet Take 1 tablet (10 mg total) by mouth daily. 12/14/21  Yes Iona Beard, MD  losartan (COZAAR) 100 MG tablet TAKE ONE BY MOUTH DAILY FOR BLOOD PRESSURE CONTROL Patient taking differently: Take 100 mg by mouth every morning. FOR BLOOD PRESSURE CONTROL 11/03/21  Yes [name], NP  Multiple Vitamins-Minerals (ANTIOXIDANT PO) Take 30 mLs by mouth daily. Aronia Berry Juice   Yes [provider]  tadalafil (CIALIS) 20 MG tablet Take 1 tablet (20 mg total) by mouth daily as needed for erectile dysfunction. 04/06/21  Yes [name], NP  zolpidem (AMBIEN) 5 MG tablet Take one tablet by mouth at bedtime as needed for sleep Patient taking differently: Take 5 mg by mouth at bedtime. 11/17/21  Yes Medina-Vargas, Monina C, NP     Critical care time: NA  Maayan Jenning D. Kenton Kingfisher, NP-C [facility] Personal contact information can be found on Amion  12/22/2021, 5:06 PM

## 2021-12-22 NOTE — Progress Notes (Addendum)
[facility] Progress Note Patient Name: Rodney Foster DOB: [birth year] MRN: [id number]   Date of Service  12/22/2021  HPI/Events of Note  Pt requesting something for sleep. He can take PO  Patient not in camera capable room  eICU Interventions  One time dose of melatonin 3 mg PO ordered  CXR post thoracentesis without complication noted     Intervention Category Minor Interventions: Routine modifications to care plan (e.g. PRN medications for pain, fever)  Shona Needles Lola Czerwonka 12/22/2021, 10:40 PM

## 2021-12-23 ENCOUNTER — Observation Stay (HOSPITAL_COMMUNITY): Payer: Medicare HMO

## 2021-12-23 DIAGNOSIS — K219 Gastro-esophageal reflux disease without esophagitis: Secondary | ICD-10-CM | POA: Diagnosis present

## 2021-12-23 DIAGNOSIS — Z8701 Personal history of pneumonia (recurrent): Secondary | ICD-10-CM | POA: Diagnosis not present

## 2021-12-23 DIAGNOSIS — Z96653 Presence of artificial knee joint, bilateral: Secondary | ICD-10-CM | POA: Diagnosis present

## 2021-12-23 DIAGNOSIS — I517 Cardiomegaly: Secondary | ICD-10-CM | POA: Diagnosis not present

## 2021-12-23 DIAGNOSIS — J9811 Atelectasis: Secondary | ICD-10-CM | POA: Diagnosis present

## 2021-12-23 DIAGNOSIS — E669 Obesity, unspecified: Secondary | ICD-10-CM | POA: Diagnosis present

## 2021-12-23 DIAGNOSIS — Z20822 Contact with and (suspected) exposure to covid-19: Secondary | ICD-10-CM | POA: Diagnosis present

## 2021-12-23 DIAGNOSIS — Z825 Family history of asthma and other chronic lower respiratory diseases: Secondary | ICD-10-CM | POA: Diagnosis not present

## 2021-12-23 DIAGNOSIS — I700 Atherosclerosis of aorta: Secondary | ICD-10-CM | POA: Diagnosis not present

## 2021-12-23 DIAGNOSIS — I10 Essential (primary) hypertension: Secondary | ICD-10-CM | POA: Diagnosis present

## 2021-12-23 DIAGNOSIS — M1990 Unspecified osteoarthritis, unspecified site: Secondary | ICD-10-CM | POA: Diagnosis present

## 2021-12-23 DIAGNOSIS — A6002 Herpesviral infection of other male genital organs: Secondary | ICD-10-CM | POA: Diagnosis present

## 2021-12-23 DIAGNOSIS — E785 Hyperlipidemia, unspecified: Secondary | ICD-10-CM | POA: Diagnosis present

## 2021-12-23 DIAGNOSIS — J869 Pyothorax without fistula: Secondary | ICD-10-CM | POA: Diagnosis not present

## 2021-12-23 DIAGNOSIS — J90 Pleural effusion, not elsewhere classified: Secondary | ICD-10-CM | POA: Diagnosis present

## 2021-12-23 DIAGNOSIS — K649 Unspecified hemorrhoids: Secondary | ICD-10-CM | POA: Diagnosis present

## 2021-12-23 DIAGNOSIS — J439 Emphysema, unspecified: Secondary | ICD-10-CM | POA: Diagnosis not present

## 2021-12-23 DIAGNOSIS — J918 Pleural effusion in other conditions classified elsewhere: Secondary | ICD-10-CM | POA: Diagnosis not present

## 2021-12-23 DIAGNOSIS — H9190 Unspecified hearing loss, unspecified ear: Secondary | ICD-10-CM | POA: Diagnosis present

## 2021-12-23 DIAGNOSIS — J42 Unspecified chronic bronchitis: Secondary | ICD-10-CM | POA: Diagnosis present

## 2021-12-23 DIAGNOSIS — I959 Hypotension, unspecified: Secondary | ICD-10-CM | POA: Diagnosis not present

## 2021-12-23 DIAGNOSIS — Z9689 Presence of other specified functional implants: Secondary | ICD-10-CM | POA: Diagnosis not present

## 2021-12-23 DIAGNOSIS — J45991 Cough variant asthma: Secondary | ICD-10-CM | POA: Diagnosis present

## 2021-12-23 DIAGNOSIS — J189 Pneumonia, unspecified organism: Secondary | ICD-10-CM | POA: Diagnosis not present

## 2021-12-23 DIAGNOSIS — Z87891 Personal history of nicotine dependence: Secondary | ICD-10-CM | POA: Diagnosis not present

## 2021-12-23 DIAGNOSIS — Z79899 Other long term (current) drug therapy: Secondary | ICD-10-CM | POA: Diagnosis not present

## 2021-12-23 DIAGNOSIS — G4733 Obstructive sleep apnea (adult) (pediatric): Secondary | ICD-10-CM | POA: Diagnosis present

## 2021-12-23 DIAGNOSIS — M109 Gout, unspecified: Secondary | ICD-10-CM | POA: Diagnosis present

## 2021-12-23 LAB — COMPREHENSIVE METABOLIC PANEL WITH GFR
ALT: 25 U/L (ref 0–44)
AST: 19 U/L (ref 15–41)
Albumin: 2.8 g/dL — ABNORMAL LOW (ref 3.5–5.0)
Alkaline Phosphatase: 116 U/L (ref 38–126)
Anion gap: 11 (ref 5–15)
BUN: 9 mg/dL (ref 8–23)
CO2: 25 mmol/L (ref 22–32)
Calcium: 9.3 mg/dL (ref 8.9–10.3)
Chloride: 99 mmol/L (ref 98–111)
Creatinine, Ser: 0.94 mg/dL (ref 0.61–1.24)
GFR, Estimated: >60 mL/min
Glucose, Bld: 107 mg/dL — ABNORMAL HIGH (ref 70–99)
Potassium: 4.3 mmol/L (ref 3.5–5.1)
Sodium: 135 mmol/L (ref 135–145)
Total Bilirubin: 0.4 mg/dL (ref 0.3–1.2)
Total Protein: 7.1 g/dL (ref 6.5–8.1)

## 2021-12-23 LAB — CBC
HCT: 39.3 % (ref 39.0–52.0)
Hemoglobin: 13.2 g/dL (ref 13.0–17.0)
MCH: 32 pg (ref 26.0–34.0)
MCHC: 33.6 g/dL (ref 30.0–36.0)
MCV: 95.2 fL (ref 80.0–100.0)
Platelets: 752 K/uL — ABNORMAL HIGH (ref 150–400)
RBC: 4.13 MIL/uL — ABNORMAL LOW (ref 4.22–5.81)
RDW: 12.1 % (ref 11.5–15.5)
WBC: 9.7 K/uL (ref 4.0–10.5)
nRBC: 0 % (ref 0.0–0.2)

## 2021-12-23 LAB — BODY FLUID CELL COUNT WITH DIFFERENTIAL
Eos, Fluid: 0 %
Lymphs, Fluid: 13 %
Monocyte-Macrophage-Serous Fluid: 32 % — ABNORMAL LOW (ref 50–90)
Neutrophil Count, Fluid: 55 % — ABNORMAL HIGH (ref 0–25)
Total Nucleated Cell Count, Fluid: 1024 cu mm — ABNORMAL HIGH (ref 0–1000)

## 2021-12-23 LAB — RESP PANEL BY RT-PCR (FLU A&B, COVID) ARPGX2
Influenza A by PCR: NEGATIVE
Influenza B by PCR: NEGATIVE
SARS Coronavirus 2 by RT PCR: NEGATIVE

## 2021-12-23 LAB — LACTATE DEHYDROGENASE, PLEURAL OR PERITONEAL FLUID: LD, Fluid: 1229 U/L — ABNORMAL HIGH (ref 3–23)

## 2021-12-23 LAB — GLUCOSE, CAPILLARY: Glucose-Capillary: 105 mg/dL — ABNORMAL HIGH (ref 70–99)

## 2021-12-23 LAB — PROTEIN, PLEURAL OR PERITONEAL FLUID: Total protein, fluid: 5.2 g/dL

## 2021-12-23 MED ORDER — SODIUM CHLORIDE (PF) 0.9 % IJ SOLN
10.0000 mg | Freq: Once | INTRAMUSCULAR | Status: AC
  Administered 2021-12-23: 14:00:00 10 mg via INTRAPLEURAL
  Filled 2021-12-23: qty 10

## 2021-12-23 MED ORDER — FENTANYL CITRATE (PF) 100 MCG/2ML IJ SOLN
INTRAMUSCULAR | Status: AC
  Filled 2021-12-23: qty 4

## 2021-12-23 MED ORDER — OXYCODONE HCL 5 MG PO TABS
5.0000 mg | ORAL_TABLET | Freq: Four times a day (QID) | ORAL | Status: AC | PRN
  Administered 2021-12-23 – 2021-12-24 (×6): 5 mg via ORAL
  Filled 2021-12-23 (×6): qty 1

## 2021-12-23 MED ORDER — KETOROLAC TROMETHAMINE 15 MG/ML IJ SOLN
15.0000 mg | Freq: Once | INTRAMUSCULAR | Status: AC
  Administered 2021-12-23: 14:00:00 15 mg via INTRAVENOUS
  Filled 2021-12-23: qty 1

## 2021-12-23 MED ORDER — STERILE WATER FOR INJECTION IJ SOLN
5.0000 mg | Freq: Once | RESPIRATORY_TRACT | Status: AC
  Administered 2021-12-23: 14:00:00 5 mg via INTRAPLEURAL
  Filled 2021-12-23: qty 5

## 2021-12-23 MED ORDER — MIDAZOLAM HCL 2 MG/2ML IJ SOLN
INTRAMUSCULAR | Status: AC | PRN
  Administered 2021-12-23 (×2): 1 mg via INTRAVENOUS

## 2021-12-23 MED ORDER — SODIUM CHLORIDE 0.9% FLUSH
10.0000 mL | Freq: Three times a day (TID) | INTRAVENOUS | Status: DC
  Administered 2021-12-23 – 2021-12-24 (×5): 10 mL

## 2021-12-23 MED ORDER — LIDOCAINE-EPINEPHRINE 1 %-1:100000 IJ SOLN
INTRAMUSCULAR | Status: AC
  Filled 2021-12-23: qty 1

## 2021-12-23 MED ORDER — LIDOCAINE 5 % EX PTCH
1.0000 | MEDICATED_PATCH | CUTANEOUS | Status: DC
  Administered 2021-12-23 – 2021-12-25 (×3): 1 via TRANSDERMAL
  Filled 2021-12-23 (×3): qty 1

## 2021-12-23 MED ORDER — FENTANYL CITRATE (PF) 100 MCG/2ML IJ SOLN
INTRAMUSCULAR | Status: AC | PRN
  Administered 2021-12-23 (×2): 50 ug via INTRAVENOUS

## 2021-12-23 MED ORDER — MIDAZOLAM HCL 2 MG/2ML IJ SOLN
INTRAMUSCULAR | Status: AC
  Filled 2021-12-23: qty 4

## 2021-12-23 NOTE — Progress Notes (Signed)
PCCM Progress Note  Unclamped chest tube after 1 hr dwell time of pleural lytic. Patient tolerated procedure well and 147ml of amber colored fluid was removed immediately  Rodney Warzecha D. Kenton Kingfisher, NP-C [facility] Personal contact information can be found on Amion  12/23/2021, 3:08 PM

## 2021-12-23 NOTE — Procedures (Addendum)
Pleural Fibrinolytic Administration Procedure Note  Rodney Foster  [id number]  [birth year]  Date:12/23/21  Time:1:57 PM   Provider Performing:Whitney D. Harris   Procedure: Pleural Fibrinolysis Initial day (70761)  Indication(s) Fibrinolysis of complicated pleural effusion  Consent Risks of the procedure as well as the alternatives and risks of each were explained to the patient and/or caregiver.  Consent for the procedure was obtained.   Anesthesia None   Time Out Verified patient identification, verified procedure, site/side was marked, verified correct patient position, special equipment/implants available, medications/allergies/relevant history reviewed, required imaging and test results available.   Sterile Technique Hand hygiene, gloves   Procedure Description Existing pleural catheter was cleaned and accessed in sterile manner.  10mg  of tPA in 30cc of saline and 5mg  of dornase in 30cc of sterile water were injected into pleural space using existing pleural catheter.  Catheter will be clamped for 1 hour and then placed back to suction.   Complications/Tolerance None; patient tolerated the procedure well.  EBL None   Specimen(s) Collected and walked to lab   Whitney D. Kenton Kingfisher, NP-C [facility] Personal contact information can be found on Amion  12/23/2021, 1:57 PM  I was present and immediately available for the procedure.

## 2021-12-23 NOTE — Plan of Care (Signed)
°  Problem: Clinical Measurements: Goal: Will remain free from infection Outcome: Progressing Goal: Diagnostic test results will improve Outcome: Progressing Goal: Respiratory complications will improve Outcome: Progressing Goal: Cardiovascular complication will be avoided Outcome: Progressing   Problem: Activity: Goal: Risk for activity intolerance will decrease Outcome: Progressing   Problem: Pain Managment: Goal: General experience of comfort will improve Outcome: Progressing

## 2021-12-23 NOTE — Progress Notes (Signed)
Pt declined CPAP. 

## 2021-12-23 NOTE — Procedures (Signed)
Pre procedural Dx: Loculated right sided pleural effusion Post procedural Dx: Same  Technically successful CT guided placed of a 12 Fr drainage catheter placement into the right pleural space yielding 75 cc of slightly tan colored serous fluid. Chest tube connected to pleur-vac  A representative aspirated sample was capped and sent to the laboratory for analysis.    EBL: Trace Complications: None immediate  Ronny Bacon, MD Pager #: [phone]

## 2021-12-23 NOTE — Consult Note (Signed)
NAME:  Rodney Foster, MRN:  [id number], DOB:  1950-07-02, LOS: 0 ADMISSION DATE:  12/22/2021, CONSULTATION DATE:  12/22/2021 REFERRING MD:  Dr. Jimmye Norman, CHIEF COMPLAINT:  Pleural effusion    History of Present Illness:  Rodney Foster is a 71 y.o. male with a PMH significant for former smoker and HTN who was directly admitted after an outpatient chest CT revealed worsened loculated pleural effusion. [name] was admitted at this facility from 12/8-12/15 for pneumonia and right pleural effusion with new supplemental oxygen demand. He was initially treated with Cefepime and Vancomycin with later transition to PO Augmentin. His clinical picture improved with conservative measures including ABT's. There was also no clear window for thoracentesis or chest tube placement.   Patient followed with with primary pulmonologist Dr. Erin Fulling on 12/21 and a repeat CXR in office was consist with a persistent right effusion. Decision was made to extent PO Augment and a repeated Chest CT was ordered.   Repeat chest CT was completed 12/22 and a large loculated pleural effusion was seen. Decision was made to direct admit patient for placement of chest tube to evacuate loculated effusion. PCCM consulted for pulmonary consult   Pertinent  Medical History  HTN  Former smoker   Significant Hospital Events: Including procedures, antibiotic start and stop dates in addition to other pertinent events   12/23 direct admit for loculated pleural effusion   Interim History / Subjective:  As above   Objective   Blood pressure 125/71, pulse 75, temperature 98 F (36.7 C), temperature source Oral, resp. rate 12, height 5\' 10"  (1.778 m), weight 88.9 kg, SpO2 95 %.        Intake/Output Summary (Last 24 hours) at 12/23/2021 1429 Last data filed at 12/23/2021 1017 Gross per 24 hour  Intake --  Output 670 ml  Net -670 ml   Filed Weights   12/22/21 1623 12/23/21 0424  Weight: 89.6 kg 88.9 kg    Examination: Gen:  no acute distress CV:RRR no mrg Resp: ctab no wheezes, right sided anterior apical chest tube in place, to suction -10   Resolved Hospital Problem list     Assessment & Plan:  Right loculated pleural effusion  - s/p CT guided drainage with pigtail catheter 12/24 - minimal drainage of 70cc after initial placement. Suspect will need fibrinolytic therapy. Will order dornase and tpa. - continue augmentin for now - apparently pleural fluid studies were not sent down yesterday. Will see if we can send fluid from IR.   Severe obstructive sleep apnea  -Previously saw Dr. Ander Slade in 2019 and home sleep study revealed severe obstruction  - he can bring in his CPAP from home  Rodney Llamas, MD [facility] [place] 12/23/2021 2:32 PM Pager: see AMION  If no response to pager, please call critical care on call (see AMION) until 7pm After 7:00 pm call [facility] (right click and "Reselect all SmartList Selections" daily)  Per primary   Labs   CBC: Recent Labs  Lab 12/22/21 1644 12/23/21 0207  WBC 11.2* 9.7  HGB 12.7* 13.2  HCT 38.9* 39.3  MCV 95.1 95.2  PLT 795* 752*    Basic Metabolic Panel: Recent Labs  Lab 12/22/21 1644 12/23/21 0207  NA 133* 135  K 4.2 4.3  CL 97* 99  CO2 26 25  GLUCOSE 105* 107*  BUN 9 9  CREATININE 0.78 0.94  CALCIUM 9.1 9.3   GFR: Estimated  Creatinine Clearance: 78.6 mL/min (by C-G formula based on SCr of 0.94 mg/dL). Recent Labs  Lab 12/22/21 1644 12/23/21 0207  WBC 11.2* 9.7    Liver Function Tests: Recent Labs  Lab 12/22/21 1644 12/23/21 0207  AST 19 19  ALT 28 25  ALKPHOS 124 116  BILITOT 0.5 0.4  PROT 7.1 7.1  ALBUMIN 2.9* 2.8*   No results for input(s): LIPASE, AMYLASE in the last 168 hours. No results for input(s): AMMONIA in the last 168 hours.  ABG No results found for: PHART, PCO2ART, PO2ART, HCO3, TCO2, ACIDBASEDEF, O2SAT   Coagulation Profile: Recent Labs  Lab  12/22/21 1644  INR 1.0    Cardiac Enzymes: No results for input(s): CKTOTAL, CKMB, CKMBINDEX, TROPONINI in the last 168 hours.  HbA1C: Hgb A1c MFr Bld  Date/Time Value Ref Range Status  01/20/2019 08:42 AM 5.5 <5.7 % of total Hgb Final    Comment:    For the purpose of screening for the presence of diabetes: . <5.7%       Consistent with the absence of diabetes 5.7-6.4%    Consistent with increased risk for diabetes             (prediabetes) > or =6.5%  Consistent with diabetes . This assay result is consistent with a decreased risk of diabetes. . Currently, no consensus exists regarding use of hemoglobin A1c for diagnosis of diabetes in children. . According to American Diabetes Association (ADA) guidelines, hemoglobin A1c <7.0% represents optimal control in non-pregnant diabetic patients. Different metrics may apply to specific patient populations.  Standards of Medical Care in Diabetes(ADA). Marland Kitchen   09/12/2015 08:17 AM 5.5 4.8 - 5.6 % Final    Comment:             Pre-diabetes: 5.7 - 6.4          Diabetes: >6.4          Glycemic control for adults with diabetes: <7.0     CBG: Recent Labs  Lab 12/23/21 0824  GLUCAP 105*

## 2021-12-23 NOTE — Progress Notes (Signed)
° °  Subjective: I seen and evaluated Rodney Foster at bedside.  He states he did not use the CPAP machine overnight because it was very noisy.  He states that he feels okay.  Denies any pain at this time.  He states that he is breathing fine and denies SOB.  Objective:  Vital signs in last 24 hours: Vitals:   12/22/21 1623 12/22/21 1922 12/22/21 2322 12/23/21 0424  BP: 125/73 124/71 (!) 143/74 (!) 108/59  Pulse: 76 73 72 78  Resp: 14 18 18 18   Temp: 98.3 F (36.8 C) 98.3 F (36.8 C) 98.3 F (36.8 C) 99 F (37.2 C)  TempSrc: Oral Oral Oral Oral  SpO2:  94% 99% 94%  Weight: 89.6 kg   88.9 kg  Height: 5\' 10"  (1.778 m)      Physical Exam HENT:     Head: Normocephalic and atraumatic.  Cardiovascular:     Rate and Rhythm: Normal rate and regular rhythm.     Heart sounds: Normal heart sounds.  Pulmonary:     Breath sounds: Decreased breath sounds present.  Musculoskeletal:     Right lower leg: No edema.     Left lower leg: No edema.  Neurological:     Mental Status: He is alert.  Psychiatric:        Behavior: Behavior is cooperative.     Assessment/Plan:  Principal Problem:   Pleural effusion  #Pleural effusion #Chronic bronchitis Pigtail placement was attempted yesterday, unfortunately it was unsuccessful. However, pleural fluid was collected and sent for testing. IR placed chest tube today, drainage yielded 75 cc of slightly tan-colored serous fluid.  Sample pleural fluid sent for analysis. Pt will need a follow up CT chest, can be obtained in the outpatient setting. --Continue Augmentin 875-125 mg twice daily for 14 days; Day 2 of 14 --FU pleural fluid results --Mucinex 600 mg twice daily --Flonase daily --Tylenol 650 every 6 hours as needed for mild pain  #HTN BP slightly elevated overnight; however normalized this morning w/o medication. In fact pt is hypotensive at 108/59. Continue to hold home medications. Patient underwent bedside procedure, although no bleeding  noted. SBP<90 given fluids; Otherwise encourage fluid intake p.o. - holding home amlodipine and losartan  #HLD - continue home atorvastatin 20    #Gout -continue home febuxostat  #GERD -continue home pantoprazole   #OSA - continue CPAP nightly    Prior to Admission Living Arrangement: Anticipated Discharge Location: Barriers to Discharge: Dispo: Anticipated discharge in approximately 1-2 day(s).   Timothy Lasso, MD 12/23/2021, 6:59 AM Pager: [phone] After 5pm on weekdays and 1pm on weekends: On Call pager [phone]

## 2021-12-23 NOTE — Consult Note (Signed)
Chief Complaint: Patient was seen in consultation today for right chest tube placement.  Referring Physician(s): Dorian Pod  Supervising Physician: Sandi Mariscal  Patient Status: [facility] - In-pt  History of Present Illness: Rodney Foster is a 71 y.o. male with a past medical history significant for OSA, HTN and recent hospitalization for CAP (12/06/21-12/14/21) who was admitted to [facility] on 12/23 from the pulmonology office for loculated right pleural effusion. Mr. Mangual was discharged on PO Augmentin on 12/15 and was seen in the pulmonology office 12/22 for follow up. CT chest during that visit showed a large loculated right pleural effusion with multiple areas of atelectasis in the right lower lobe. He was admitted by PCCM and US guided right chest tube was attempted by their service however this was unsuccessful. Thoracentesis was tjen performed yielding only 30 cc of serous fluid. IR has been consulted for right pigtail chest tube placement for drainage of empyema.  Past Medical History:  Diagnosis Date   Backache, unspecified    Bronchitis    Cough variant asthma 05/03/2017   Genital herpes    Gout    Hearing loss    Hemorrhoids    Hernia    Herpes simplex    Hyperglycemia    Hyperlipidemia    Hypertension    Impotence    Insomnia, unspecified    Osteoarthrosis, unspecified whether generalized or localized, unspecified site    Tinea pedis, right    Unspecified disorder of male genital organs     Past Surgical History:  Procedure Laterality Date   fracture left femur  1984   ganglion repair of left forarm  2000   IVP  20   ML old comp fracture   KNEE SURGERY Right 01/21/2019   [place]   TONSILLECTOMY     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   Procedure: TOTAL KNEE ARTHROPLASTY;  Surgeon: Earlie Server, MD;  Location: [facility];  Service: Orthopedics;  Laterality: Right;   TOTAL KNEE ARTHROPLASTY Left  05/06/2020   Procedure: TOTAL KNEE ARTHROPLASTY;  Surgeon: Earlie Server, MD;  Location: [facility];  Service: Orthopedics;  Laterality: Left;   UMBILICAL HERNIA REPAIR  2002    Allergies: Patient has no known allergies.  Medications: Prior to Admission medications   Medication Sig Start Date End Date Taking? Authorizing Provider  acetaminophen (TYLENOL) 500 MG tablet Take 1,000 mg by mouth every 6 (six) hours as needed for headache (pain).   Yes [provider]  acyclovir (ZOVIRAX) 400 MG tablet TAKE 1 TABLET BY MOUTH EVERY DAY Patient taking differently: 400 mg every morning. 08/25/21  Yes Eubanks, Carlos American, NP  amLODipine (NORVASC) 5 MG tablet TAKE ONE TABLET BY MOUTH ONCE DAILY TO CONTROL BLOOD PRESSURE Patient taking differently: Take 5 mg by mouth every morning. to control blood pressure 08/25/21  Yes [name], NP  amoxicillin-clavulanate (AUGMENTIN) 875-125 MG tablet Take 1 tablet by mouth 2 (two) times daily for 14 days. 12/20/21 01/03/22 Yes Freddi Starr, MD  atorvastatin (LIPITOR) 20 MG tablet TAKE 1 TABLET BY MOUTH EVERYDAY AT BEDTIME Patient taking differently: Take 20 mg by mouth every morning. 10/30/21  Yes [name], NP  febuxostat (ULORIC) 40 MG tablet Take 1 tablet (40 mg total) by mouth daily. 03/09/20  Yes [name], NP  fluticasone (FLONASE) 50 MCG/ACT nasal spray Place 1 spray into both nostrils daily. Patient taking differently: Place 1  spray into both nostrils at bedtime. 12/14/21  Yes Iona Beard, MD  guaiFENesin (MUCINEX) 600 MG 12 hr tablet Take 600 mg by mouth 2 (two) times daily.   Yes [provider]  HYDROcodone-acetaminophen (NORCO/VICODIN) 5-325 MG tablet Take 1 tablet by mouth every 6 (six) hours as needed for moderate pain.   Yes [provider]  loratadine (CLARITIN) 10 MG tablet Take 1 tablet (10 mg total) by mouth daily. 12/14/21  Yes Iona Beard, MD  losartan (COZAAR) 100 MG tablet TAKE ONE BY  MOUTH DAILY FOR BLOOD PRESSURE CONTROL Patient taking differently: Take 100 mg by mouth every morning. FOR BLOOD PRESSURE CONTROL 11/03/21  Yes [name], NP  Multiple Vitamins-Minerals (ANTIOXIDANT PO) Take 30 mLs by mouth daily. Aronia Berry Juice   Yes [provider]  tadalafil (CIALIS) 20 MG tablet Take 1 tablet (20 mg total) by mouth daily as needed for erectile dysfunction. 04/06/21  Yes [name], NP  zolpidem (AMBIEN) 5 MG tablet Take one tablet by mouth at bedtime as needed for sleep Patient taking differently: Take 5 mg by mouth at bedtime. 11/17/21  Yes Medina-Vargas, Monina C, NP     Family History  Problem Relation Age of Onset   COPD Mother    Aortic aneurysm Father     Social History   Socioeconomic History   Marital status: Divorced    Spouse name: Not on file   Number of children: Not on file   Years of education: Not on file   Highest education level: Not on file  Occupational History   Not on file  Tobacco Use   Smoking status: Former    Packs/day: 1.00    Years: 15.00    Pack years: 15.00    Types: Cigarettes    Quit date: 01/01/1984    Years since quitting: 38.0   Smokeless tobacco: Former    Quit date: 1990  Scientific laboratory technician Use: Never used  Substance and Sexual Activity   Alcohol use: Yes    Alcohol/week: 6.0 - 7.0 standard drinks    Types: 6 - 7 Glasses of wine per week    Comment: 3-4 a night of wine or cocktail   Drug use: No   Sexual activity: Yes  Other Topics Concern   Not on file  Social History Narrative   Not on file   Social Determinants of Health   Financial Resource Strain: Not on file  Food Insecurity: Not on file  Transportation Needs: Not on file  Physical Activity: Not on file  Stress: Not on file  Social Connections: Not on file     Review of Systems: A 12 point ROS discussed and pertinent positives are indicated in the HPI above.  All other systems are negative.  Review of Systems   Constitutional:  Negative for chills and fever.  Respiratory:  Positive for cough and shortness of breath.   Cardiovascular:  Positive for chest pain.  Gastrointestinal:  Negative for abdominal pain, nausea and vomiting.  Musculoskeletal:  Positive for back pain (at previous puncture site).  Psychiatric/Behavioral:  The patient is nervous/anxious.    Vital Signs: BP 139/76 (BP Location: Right Arm)    Pulse 78    Temp 98.3 F (36.8 C) (Oral)    Resp 13    Ht 5\' 10"  (1.778 m)    Wt 196 lb (88.9 kg)    SpO2 92%    BMI 28.12 kg/m   Physical Exam Vitals  and nursing note reviewed.  Constitutional:      General: He is not in acute distress. HENT:     Head: Normocephalic.     Mouth/Throat:     Mouth: Mucous membranes are dry.     Pharynx: Oropharynx is clear. No oropharyngeal exudate or posterior oropharyngeal erythema.  Cardiovascular:     Rate and Rhythm: Normal rate and regular rhythm.  Pulmonary:     Effort: Pulmonary effort is normal.     Comments: Decreased breath sounds right side Skin:    General: Skin is warm and dry.  Neurological:     Mental Status: He is alert and oriented to person, place, and time.  Psychiatric:        Mood and Affect: Mood normal.        Behavior: Behavior normal.        Thought Content: Thought content normal.        Judgment: Judgment normal.     MD Evaluation Airway: WNL Heart: WNL Abdomen: WNL Chest/ Lungs: WNL ASA  Classification: 3 Mallampati/Airway Score: One   Imaging: DG Chest 1 View  Result Date: 12/06/2021 CLINICAL DATA:  Right chest pain and shortness of breath EXAM: CHEST  1 VIEW COMPARISON:  12/05/2021 FINDINGS: Lower lung volumes with increased bibasilar atelectasis versus developing pneumonia or airspace process. Trace right effusion suspected. Normal heart size and vascularity. No edema pattern or CHF. Negative for pneumothorax. Trachea midline. Remote left posterior rib fractures evident. Degenerative changes of the right  shoulder. IMPRESSION: Lower lung volumes with worsening bibasilar atelectasis versus developing pneumonia. Trace right pleural effusion. Electronically Signed   By: Jerilynn Mages.  Shick M.D.   On: 12/06/2021 10:47   DG Chest 2 View  Result Date: 12/20/2021 CLINICAL DATA:  Pleural effusion. EXAM: CHEST - 2 VIEW COMPARISON:  12/13/2021 and CT chest 12/07/2021. FINDINGS: Trachea is midline. Heart size stable. Large amount of pleural fluid is loculated laterally within the right hemithorax, overall decreased from 12/13/2021. Associated airspace opacification in the right lung base. Streaky densities in the left lower lobe. L1 compression fracture, unchanged. IMPRESSION: 1. Large loculated right pleural effusion, decreased in size from 12/13/2021, worrisome for empyema. 2. Right basilar airspace opacification may be due to pneumonia. 3. Streaky atelectasis in the left lower lobe. Electronically Signed   By: Lorin Picket M.D.   On: 12/20/2021 16:38   DG Chest 2 View  Result Date: 12/05/2021 CLINICAL DATA:  Cough EXAM: CHEST - 2 VIEW COMPARISON:  Chest x-ray 05/03/2017, CT chest 02/09/2016 FINDINGS: The heart and mediastinal contours are unchanged. Biapical pleural/pulmonary scarring. Streaky airspace opacity at the right base likely atelectasis. No focal consolidation. No pulmonary edema. No pleural effusion. No pneumothorax. No acute osseous abnormality.  Old healed rib fractures. IMPRESSION: Streaky airspace opacities at the right base likely atelectasis. Electronically Signed   By: Iven Finn M.D.   On: 12/05/2021 19:44   CT Chest W Contrast  Result Date: 12/21/2021 CLINICAL DATA: LOCULATED PLEURAL EFFUSION: CLINICAL DATA: LOCULATED PLEURAL EFFUSION Loculated pleural effusion/empyema EXAM: CT CHEST WITH CONTRAST TECHNIQUE: Multidetector CT imaging of the chest was performed during intravenous contrast administration. CONTRAST:  21mL ISOVUE-300 IOPAMIDOL (ISOVUE-300) INJECTION 61% COMPARISON:  CT examination  dated December 7 and December 07, 2021. FINDINGS: Cardiovascular: No significant vascular findings. Normal heart size. No pericardial effusion. Mediastinum/Nodes: No enlarged mediastinal, hilar, or axillary lymph nodes. Thyroid gland, trachea, and esophagus demonstrate no significant findings. Lungs/Pleura: Large loculated pleural effusion in the right upper lobe. There is multiple  area of subsegmental atelectasis and fibrosis in the right lower lobe. Left lung is clear. No pneumothorax. Upper Abdomen: No acute abnormality. Musculoskeletal: Superior endplate deformity of the T12 vertebral body. There is also small hypodense lesion in the left posterior aspect of the T10 vertebral body, likely hemangioma. IMPRESSION: 1. Large loculated right pleural effusion with multiple areas of atelectasis in the right lower lobe likely secondary to recent pneumonia. Left lung is clear. 2. Follow-up examination in 8-12 weeks is recommended for further evaluation. Electronically Signed   By: Keane Police D.O.   On: 12/21/2021 11:01   CT Angio Chest Pulmonary Embolism (PE) W or WO Contrast  Result Date: 12/07/2021 CLINICAL DATA:  Positive D-dimer, pulmonary embolism suspected. EXAM: CT ANGIOGRAPHY CHEST WITH CONTRAST TECHNIQUE: Multidetector CT imaging of the chest was performed using the standard protocol during bolus administration of intravenous contrast. Multiplanar CT image reconstructions and MIPs were obtained to evaluate the vascular anatomy. CONTRAST:  45mL OMNIPAQUE IOHEXOL 350 MG/ML SOLN COMPARISON:  12/06/2021. FINDINGS: Cardiovascular: The heart is normal in size and there is no pericardial effusion. There is mild atherosclerotic calcification of the aorta without evidence of aneurysm. Aberrant origin of the right subclavian artery is noted which courses posterior to the esophagus. The pulmonary trunk is normal in caliber. No definite evidence of pulmonary artery filling defect. Evaluation of the segmental and  subsegmental arteries is limited due to mixing artifact, respiratory motion, and infiltrates. Mediastinum/Nodes: Enlarged lymph nodes are present at the right hilum and there is bronchial wall thickening on the right. Shotty lymph nodes are noted in the mediastinum. No axillary lymphadenopathy is seen. The thyroid gland, trachea, and esophagus are within normal limits. A small hiatal hernia is noted. Lungs/Pleura: There is bronchial wall thickening on the right with narrowing of the right lower lobe bronchus with right lower lobe consolidation. Mild consolidation is seen in the right middle lobe and lingular segment of the left upper lobe. A small to moderate right pleural effusion is seen. There is mild consolidation in the left lower lobe with areas of mucous plugging. No pneumothorax is seen bilaterally. Upper Abdomen: No acute abnormality. Musculoskeletal: No acute osseous abnormality. Review of the MIP images confirms the above findings. IMPRESSION: 1. No definite evidence of pulmonary embolism. Evaluation of the segmental and subsegmental arteries at the lung bases is limited due to mixing artifact, respiratory motion, and infiltrates. The need for repeat evaluation should be determined clinically. 2. Bilateral lower lobe consolidation and consolidation in the right middle lobe and lingular segment of the left upper lobe, increased from the prior exam and concerning for pneumonia. There is bronchial wall thickening and prominent lymph nodes in the hilar region on the right. Follow-up is recommended until resolution to exclude the possibility of underlying neoplasm. 3. Small to moderate right pleural effusion, increased in size from the prior exam. 4. Remaining chronic findings as described above. Electronically Signed   By: Brett Fairy M.D.   On: 12/07/2021 00:55   CT CHEST ABDOMEN PELVIS W CONTRAST  Result Date: 12/06/2021 CLINICAL DATA:  Sepsis, right upper quadrant pain, right chest pain EXAM: CT  CHEST, ABDOMEN, AND PELVIS WITH CONTRAST TECHNIQUE: Multidetector CT imaging of the chest, abdomen and pelvis was performed following the standard protocol during bolus administration of intravenous contrast. CONTRAST:  19mL OMNIPAQUE IOHEXOL 350 MG/ML SOLN COMPARISON:  02/09/2016 CT angio chest. Lumbar spine series 07/08/2017. FINDINGS: CT CHEST FINDINGS Cardiovascular: Retroesophageal right subclavian artery. Scattered aortic calcifications. No aneurysm. Heart is normal  size. Mediastinum/Nodes: No mediastinal, hilar, or axillary adenopathy. Trachea and esophagus are unremarkable. Thyroid unremarkable. Lungs/Pleura: Mucous plugging in the lower lobe airways bilaterally. Areas of consolidation noted in both lower lobes, right greater than left as well as the right middle lobe and lingula concerning for pneumonia. Trace right pleural effusion. Musculoskeletal: Chest wall soft tissues are unremarkable. No acute bony abnormality CT ABDOMEN PELVIS FINDINGS Hepatobiliary: Small layering gallstones within the gallbladder. No focal hepatic abnormality. Pancreas: No focal abnormality or ductal dilatation. Spleen: No focal abnormality.  Normal size. Adrenals/Urinary Tract: No adrenal abnormality. No focal renal abnormality. No stones or hydronephrosis. Urinary bladder is unremarkable. Stomach/Bowel: Normal appendix. Stomach, large and small bowel grossly unremarkable. Vascular/Lymphatic: Aortic atherosclerosis. No evidence of aneurysm or adenopathy. Reproductive: No visible focal abnormality. Other: No free fluid or free air. Small bilateral inguinal hernias containing fat. Musculoskeletal: No acute bony abnormality. Chronic L1 compression fracture, stable since prior lumbar spine series 07/08/2017. IMPRESSION: Mucous plugging with airspace disease in both lower lobes, right worse than left as well as in the right middle lobe and lingula. Findings concerning for pneumonia. Trace right pleural effusion. Cholelithiasis.  No  CT evidence of acute cholecystitis. Aortic atherosclerosis. Electronically Signed   By: Rolm Baptise M.D.   On: 12/06/2021 14:09   CT L-SPINE NO CHARGE  Result Date: 12/06/2021 CLINICAL DATA:  Shortness of breath, right upper quadrant pain. Right-sided chest pain. EXAM: CT LUMBAR SPINE WITHOUT CONTRAST TECHNIQUE: Multidetector CT imaging of the lumbar spine was performed without intravenous contrast administration. Multiplanar CT image reconstructions were also generated. COMPARISON:  Radiograph dated November 14, 2021 and MRI examination dated November 23,2022. FINDINGS: Segmentation: 5 lumbar type vertebrae. Alignment: Normal. Vertebrae: Chronic compression deformity of L1 vertebral body with approximately 40% anterior vertebral body height loss, unchanged. Paraspinal and other soft tissues: Negative. Disc levels: Multilevel DA and disc disease with disc space narrowing and marginal osteophytes prominent at L 3-L4, L4-L5 with associated facet joint arthropathy. Others: Incidentally imaged abdominal viscera demonstrate no acute abnormality. IMPRESSION: 1. Chronic compression deformity of the L1 vertebral body, unchanged. 2. Multilevel degenerative disc disease with disc space narrowing, marginal osteophyte and associated facet joint arthropathy. Electronically Signed   By: Keane Police D.O.   On: 12/06/2021 14:13   DG CHEST PORT 1 VIEW  Result Date: 12/22/2021 CLINICAL DATA:  RIGHT pleural effusion EXAM: PORTABLE CHEST 1 VIEW COMPARISON:  Portable exam 1901 hours compared to 12/20/2021 and correlated with CT chest of 12/21/2021 FINDINGS: Enlargement of cardiac silhouette. Mediastinal contours and pulmonary vascularity normal. Loculated pleural effusion at the posterolateral mid to upper RIGHT hemithorax unchanged. Associated RIGHT lung atelectasis and potentially infiltrate at base. Retrocardiac LEFT lower lobe consolidation increased. No pneumothorax or acute osseous findings. IMPRESSION: Persistent  loculated RIGHT pleural effusion with atelectasis and probable basilar infiltrate in RIGHT lung. Increased retrocardiac LEFT lower lobe consolidation since prior exam. Electronically Signed   By: Lavonia Dana M.D.   On: 12/22/2021 19:12   DG CHEST PORT 1 VIEW  Result Date: 12/13/2021 CLINICAL DATA:  Pneumonia, pleural effusion, shortness of breath EXAM: PORTABLE CHEST 1 VIEW COMPARISON:  Previous chest radiographs including the examination of 12/11/2021 and CT chest done on 12/07/2021 FINDINGS: Transverse diameter of heart is within normal limits. There is blunting of left lateral CP angle. Left lung is essentially clear of any infiltrates or pulmonary edema. There is large homogeneous opacity in the right upper and right mid lung fields. There is blunting of right lateral costophrenic angle. Patchy  infiltrate is seen in the right lower lung fields. There is possible small patchy infiltrate in the medial left lower lung fields. There is no pneumothorax. IMPRESSION: There is large homogeneous opacity in the right upper and right mid lung fields. This may suggest pneumonia or possibly loculated pleural effusion. Possibility of empyema is not excluded. If clinically warranted, follow-up CT may be considered There are patchy infiltrates in right lower lung fields suggesting atelectasis/pneumonia. Electronically Signed   By: Elmer Picker M.D.   On: 12/13/2021 12:09   DG CHEST PORT 1 VIEW  Result Date: 12/11/2021 CLINICAL DATA:  Pleural effusion EXAM: PORTABLE CHEST 1 VIEW COMPARISON:  12/06/2021 FINDINGS: Two frontal views of the chest demonstrate a stable cardiac silhouette. There is marked increased consolidation within the right lower lobe. Small right pleural effusion has increased since prior study. Patchy consolidation at the left lung base unchanged. No pneumothorax. No acute bony abnormalities. IMPRESSION: 1. Worsening right lower lobe consolidation and right pleural effusion. 2. Stable left  basilar consolidation. Electronically Signed   By: Randa Ngo M.D.   On: 12/11/2021 15:17   ECHOCARDIOGRAM COMPLETE  Result Date: 12/11/2021    ECHOCARDIOGRAM REPORT   Patient Name:   CALLIE BUNYARD Date of Exam: 12/11/2021 Medical Rec #:  [id number]     Height:       69.7 in Accession #:    [id number]    Weight:       200.8 lb Date of Birth:  11-May-1950     BSA:          2.084 m Patient Age:    44 years      BP:           136/81 mmHg Patient Gender: M             HR:           97 bpm. Exam Location:  Inpatient Procedure: 2D Echo, Cardiac Doppler and Color Doppler Indications:    dyspnea  History:        Patient has no prior history of Echocardiogram examinations.                 Risk Factors:Hypertension and Dyslipidemia.  Sonographer:    Melissa Morford RDCS (AE, PE) Referring Phys: [id number] [facility] D HARRIS  Sonographer Comments: Image acquisition challenging due to respiratory motion. IMPRESSIONS  1. Mid and basal inferior wall hypokinesis . Left ventricular ejection fraction, by estimation, is 45 to 50%. The left ventricle has mildly decreased function. The left ventricle has no regional wall motion abnormalities. The left ventricular internal cavity size was mildly dilated. Left ventricular diastolic parameters are consistent with Grade I diastolic dysfunction (impaired relaxation).  2. Right ventricular systolic function is normal. The right ventricular size is normal.  3. The mitral valve is abnormal. Trivial mitral valve regurgitation. No evidence of mitral stenosis.  4. Calcified non coronary cusp . The aortic valve is tricuspid. There is moderate calcification of the aortic valve. Aortic valve regurgitation is not visualized. Aortic valve sclerosis/calcification is present, without any evidence of aortic stenosis.  5. The inferior vena cava is normal in size with greater than 50% respiratory variability, suggesting right atrial pressure of 3 mmHg. FINDINGS  Left Ventricle: Mid and basal inferior wall  hypokinesis. Left ventricular ejection fraction, by estimation, is 45 to 50%. The left ventricle has mildly decreased function. The left ventricle has no regional wall motion abnormalities. The left ventricular internal cavity size was mildly dilated. There  is no left ventricular hypertrophy. Left ventricular diastolic parameters are consistent with Grade I diastolic dysfunction (impaired relaxation). Right Ventricle: The right ventricular size is normal. No increase in right ventricular wall thickness. Right ventricular systolic function is normal. Left Atrium: Left atrial size was normal in size. Right Atrium: Right atrial size was normal in size. Pericardium: There is no evidence of pericardial effusion. Mitral Valve: The mitral valve is abnormal. There is mild thickening of the mitral valve leaflet(s). There is mild calcification of the mitral valve leaflet(s). Trivial mitral valve regurgitation. No evidence of mitral valve stenosis. Tricuspid Valve: The tricuspid valve is normal in structure. Tricuspid valve regurgitation is trivial. No evidence of tricuspid stenosis. Aortic Valve: Calcified non coronary cusp. The aortic valve is tricuspid. There is moderate calcification of the aortic valve. Aortic valve regurgitation is not visualized. Aortic valve sclerosis/calcification is present, without any evidence of aortic stenosis. Pulmonic Valve: The pulmonic valve was normal in structure. Pulmonic valve regurgitation is not visualized. No evidence of pulmonic stenosis. Aorta: The aortic root is normal in size and structure. Venous: The inferior vena cava is normal in size with greater than 50% respiratory variability, suggesting right atrial pressure of 3 mmHg. IAS/Shunts: No atrial level shunt detected by color flow Doppler.  LEFT VENTRICLE PLAX 2D LVIDd:         3.90 cm   Diastology LVIDs:         2.70 cm   LV e' medial:    5.77 cm/s LV PW:         1.00 cm   LV E/e' medial:  13.1 LV IVS:        0.80 cm   LV e'  lateral:   10.20 cm/s LVOT diam:     2.10 cm   LV E/e' lateral: 7.4 LV SV:         95 LV SV Index:   46 LVOT Area:     3.46 cm  RIGHT VENTRICLE RV S prime:     12.40 cm/s TAPSE (M-mode): 2.1 cm LEFT ATRIUM             Index        RIGHT ATRIUM           Index LA diam:        3.40 cm 1.63 cm/m   RA Area:     19.80 cm LA Vol (A2C):   45.0 ml 21.59 ml/m  RA Volume:   62.20 ml  29.84 ml/m LA Vol (A4C):   37.8 ml 18.14 ml/m LA Biplane Vol: 42.9 ml 20.58 ml/m  AORTIC VALVE LVOT Vmax:   147.00 cm/s LVOT Vmean:  102.000 cm/s LVOT VTI:    0.275 m  AORTA Ao Root diam: 3.40 cm Ao Asc diam:  3.20 cm MITRAL VALVE                TRICUSPID VALVE MV Area (PHT): 3.83 cm     TR Peak grad:   14.4 mmHg MV Decel Time: 198 msec     TR Vmax:        190.00 cm/s MV E velocity: 75.80 cm/s MV A velocity: 106.00 cm/s  SHUNTS MV E/A ratio:  0.72         Systemic VTI:  0.28 m                             Systemic Diam: 2.10 cm Jenkins Rouge MD Electronically  signed by Jenkins Rouge MD Signature Date/Time: 12/11/2021/4:52:02 PM    Final     Labs:  CBC: Recent Labs    12/13/21 0107 12/14/21 0135 12/22/21 1644 12/23/21 0207  WBC 13.2* 12.1* 11.2* 9.7  HGB 11.9* 11.7* 12.7* 13.2  HCT 36.2* 35.5* 38.9* 39.3  PLT 397 432* 795* 752*    COAGS: Recent Labs    12/06/21 1109 12/22/21 1644  INR 1.1 1.0  APTT 24  --     BMP: Recent Labs    12/13/21 0107 12/14/21 0135 12/22/21 1644 12/23/21 0207  NA 133* 134* 133* 135  K 3.7 3.9 4.2 4.3  CL 98 99 97* 99  CO2 26 26 26 25   GLUCOSE 122* 112* 105* 107*  BUN 8 10 9 9   CALCIUM 8.8* 8.6* 9.1 9.3  CREATININE 0.73 0.71 0.78 0.94  GFRNONAA >60 >60 >60 >60    LIVER FUNCTION TESTS: Recent Labs    12/09/21 0121 12/12/21 0203 12/22/21 1644 12/23/21 0207  BILITOT 0.1* 0.6 0.5 0.4  AST 14* 27 19 19   ALT 16 25 28 25   ALKPHOS 88 119 124 116  PROT 5.7* 6.1* 7.1 7.1  ALBUMIN 2.1* 2.1* 2.9* 2.8*    TUMOR MARKERS: No results for input(s): AFPTM, CEA, CA199, CHROMGRNA  in the last 8760 hours.  Assessment and Plan:  71 y/o M with history of recent hospitalization for CAP found to have large loculated right pleural effusion concerning for empyema. He was admitted yesterday by PCCM and US guided chest tube was attempted, however this was unsuccessful and IR has been asked to place a pigtail catheter for drainage of loculated pleural effusion.  Risks and benefits discussed with the patient including bleeding, infection, damage to adjacent structures, fistula connection, and sepsis.  All of the patient's questions were answered, patient is agreeable to proceed.  Consent signed and in chart.  Thank you for this interesting consult.  I greatly enjoyed meeting CAP MASSI and look forward to participating in their care.  A copy of this report was sent to the requesting provider on this date.  Electronically Signed: Joaquim Nam, PA-C 12/23/2021, 8:30 AM   I spent a total of 40 Minutes in face to face in clinical consultation, greater than 50% of which was counseling/coordinating care for right chest tube placement.

## 2021-12-24 LAB — BASIC METABOLIC PANEL
Anion gap: 10 (ref 5–15)
BUN: 10 mg/dL (ref 8–23)
CO2: 24 mmol/L (ref 22–32)
Calcium: 9.1 mg/dL (ref 8.9–10.3)
Chloride: 99 mmol/L (ref 98–111)
Creatinine, Ser: 0.8 mg/dL (ref 0.61–1.24)
GFR, Estimated: >60 mL/min (ref >60)
Glucose, Bld: 203 mg/dL — ABNORMAL HIGH (ref 70–99)
Potassium: 3.7 mmol/L (ref 3.5–5.1)
Sodium: 133 mmol/L — ABNORMAL LOW (ref 135–145)

## 2021-12-24 LAB — GLUCOSE, PLEURAL OR PERITONEAL FLUID: Glucose, Fluid: 73 mg/dL

## 2021-12-24 LAB — GLUCOSE, CAPILLARY: Glucose-Capillary: 109 mg/dL — ABNORMAL HIGH (ref 70–99)

## 2021-12-24 MED ORDER — SODIUM CHLORIDE (PF) 0.9 % IJ SOLN
10.0000 mg | Freq: Once | INTRAMUSCULAR | Status: AC
  Administered 2021-12-24: 17:00:00 10 mg via INTRAPLEURAL
  Filled 2021-12-24: qty 10

## 2021-12-24 MED ORDER — SODIUM CHLORIDE 0.9% FLUSH
10.0000 mL | Freq: Three times a day (TID) | INTRAVENOUS | Status: DC
  Administered 2021-12-25: 06:00:00 10 mL

## 2021-12-24 MED ORDER — STERILE WATER FOR INJECTION IJ SOLN
5.0000 mg | Freq: Once | RESPIRATORY_TRACT | Status: AC
  Administered 2021-12-24: 17:00:00 5 mg via INTRAPLEURAL
  Filled 2021-12-24: qty 5

## 2021-12-24 NOTE — Progress Notes (Signed)
° °  Subjective: I seen and evaluated [name] at bedside.  He was lying comfortably in bed.  He states that he slept okay during the night.  He endorsed some tenderness around the chest tube placement.  He endorses the pain medication provided moderate relief.  He denies shortness of breath or chest pain.  He endorses appetite.  Denies N/V/constipation or diarrhea.  Objective:  Vital signs in last 24 hours: Vitals:   12/23/21 1504 12/23/21 1919 12/24/21 0005 12/24/21 0420  BP:  122/86 133/90 126/80  Pulse: 72 73 83 70  Resp:  17 16 17   Temp:  98.5 F (36.9 C) 97.8 F (36.6 C) 97.6 F (36.4 C)  TempSrc:  Oral Oral Oral  SpO2:  94% 95% 94%  Weight:      Height:       Physical Exam Constitutional:      General: He is awake. He is not in acute distress. HENT:     Head: Normocephalic and atraumatic.  Cardiovascular:     Rate and Rhythm: Normal rate and regular rhythm.     Heart sounds: Normal heart sounds.  Pulmonary:     Effort: Pulmonary effort is normal.     Breath sounds: Normal air entry.  Chest:     Comments: Chest tube placed on right axillary.  Site appears clean and dry. Abdominal:     Palpations: Abdomen is soft.  Skin:    General: Skin is warm and dry.  Neurological:     General: No focal deficit present.     Mental Status: He is alert.  Psychiatric:        Behavior: Behavior normal. Behavior is cooperative.     Assessment/Plan:  Principal Problem:   Pleural effusion  Pleural effusion Chronic bronchitis Chest tube was placed yesterday, postplacement CT reports successful placement.  Patient tolerated clamp test and 160 mL of amber-colored fluid was removed yesterday.  Vitals stable overnight; patient remains afebrile.  Today patient endorses some tenderness around chest tube site placement, stating pain medication provides moderate relief.  On exam, placement site appears clean and dry; no sign of infection.  About 20 cc amber-colored fluid noted in bag in  the room.  Pleural fluid cultures still pending; gram stain shows no organisms LDH of pleural fluid within normal limits. --Continue Augmentin 875-125 mg twice daily for 14 days; day 3 of 14 --Pleural fluid cultures pending --Mucinex 600 mg twice daily --Flonase daily --Tylenol 650 every 6 hours as needed for pain --Oxycodone 5 mg every 6 hours as needed for severe pain  HTN Patient continues to be normotensive without restarting home medications.  Patient denies chest pain, palpitations or shortness of breath. --Continue holding amlodipine and losartan  HLD - continue home atorvastatin 20    Gout -continue home febuxostat  GERD -continue home pantoprazole   OSA Patient refuses CPAP machine at night.  However denies trouble sleeping or difficulty breathing.  Patient saturating well on room air. - continue CPAP nightly  Prior to Admission Living Arrangement: Anticipated Discharge Location: Barriers to Discharge: Dispo: Anticipated discharge in approximately 1-2 day(s).   Timothy Lasso, MD 12/24/2021, 7:50 AM Pager: [phone] After 5pm on weekdays and 1pm on weekends: On Call pager [phone]

## 2021-12-24 NOTE — Procedures (Signed)
Pleural Fibrinolytic Administration Procedure Note  Rodney Foster  [id number]  12-30-1950  Date:12/24/21  Time:6:12 PM   Provider Performing:Teodora Baumgarten D. Harris   Procedure: Pleural Fibrinolysis Subsequent day (29798)  Indication(s) Fibrinolysis of complicated pleural effusion  Consent Risks of the procedure as well as the alternatives and risks of each were explained to the patient and/or caregiver.  Consent for the procedure was obtained.   Anesthesia None   Time Out Verified patient identification, verified procedure, site/side was marked, verified correct patient position, special equipment/implants available, medications/allergies/relevant history reviewed, required imaging and test results available.   Sterile Technique Hand hygiene, gloves   Procedure Description Existing pleural catheter was cleaned and accessed in sterile manner.  10mg  of tPA in 30cc of saline and 5mg  of dornase in 30cc of sterile water were injected into pleural space using existing pleural catheter.  Catheter will be clamped for 1 hour and then placed back to suction.   Complications/Tolerance None; patient tolerated the procedure well.  EBL None   Specimen(s) None  Rodney Conaway D. Kenton Kingfisher, NP-C [facility] Personal contact information can be found on Amion  12/24/2021, 6:13 PM

## 2021-12-25 ENCOUNTER — Inpatient Hospital Stay (HOSPITAL_COMMUNITY): Payer: Medicare HMO

## 2021-12-25 DIAGNOSIS — J90 Pleural effusion, not elsewhere classified: Principal | ICD-10-CM

## 2021-12-25 DIAGNOSIS — J918 Pleural effusion in other conditions classified elsewhere: Secondary | ICD-10-CM

## 2021-12-25 DIAGNOSIS — Z9689 Presence of other specified functional implants: Secondary | ICD-10-CM

## 2021-12-25 DIAGNOSIS — J189 Pneumonia, unspecified organism: Secondary | ICD-10-CM

## 2021-12-25 DIAGNOSIS — J869 Pyothorax without fistula: Secondary | ICD-10-CM

## 2021-12-25 LAB — CBC WITH DIFFERENTIAL/PLATELET
Abs Immature Granulocytes: 0.04 10*3/uL (ref 0.00–0.07)
Basophils Absolute: 0.1 10*3/uL (ref 0.0–0.1)
Basophils Relative: 1 %
Eosinophils Absolute: 0.1 10*3/uL (ref 0.0–0.5)
Eosinophils Relative: 1 %
HCT: 37.7 % — ABNORMAL LOW (ref 39.0–52.0)
Hemoglobin: 12.3 g/dL — ABNORMAL LOW (ref 13.0–17.0)
Immature Granulocytes: 0 %
Lymphocytes Relative: 34 %
Lymphs Abs: 4.3 10*3/uL — ABNORMAL HIGH (ref 0.7–4.0)
MCH: 31.2 pg (ref 26.0–34.0)
MCHC: 32.6 g/dL (ref 30.0–36.0)
MCV: 95.7 fL (ref 80.0–100.0)
Monocytes Absolute: 1.5 10*3/uL — ABNORMAL HIGH (ref 0.1–1.0)
Monocytes Relative: 12 %
Neutro Abs: 6.5 10*3/uL (ref 1.7–7.7)
Neutrophils Relative %: 52 %
Platelets: 644 10*3/uL — ABNORMAL HIGH (ref 150–400)
RBC: 3.94 MIL/uL — ABNORMAL LOW (ref 4.22–5.81)
RDW: 12.4 % (ref 11.5–15.5)
WBC: 12.5 10*3/uL — ABNORMAL HIGH (ref 4.0–10.5)
nRBC: 0 % (ref 0.0–0.2)

## 2021-12-25 LAB — GLUCOSE, CAPILLARY: Glucose-Capillary: 108 mg/dL — ABNORMAL HIGH (ref 70–99)

## 2021-12-25 MED ORDER — STERILE WATER FOR INJECTION IJ SOLN
5.0000 mg | Freq: Once | RESPIRATORY_TRACT | Status: AC
  Administered 2021-12-25: 10:00:00 5 mg via INTRAPLEURAL
  Filled 2021-12-25: qty 5

## 2021-12-25 MED ORDER — SODIUM CHLORIDE (PF) 0.9 % IJ SOLN
10.0000 mg | Freq: Once | INTRAMUSCULAR | Status: AC
  Administered 2021-12-25: 10:00:00 10 mg via INTRAPLEURAL
  Filled 2021-12-25: qty 10

## 2021-12-25 MED ORDER — PANTOPRAZOLE SODIUM 40 MG PO TBEC
40.0000 mg | DELAYED_RELEASE_TABLET | Freq: Once | ORAL | Status: AC
  Administered 2021-12-25: 13:00:00 40 mg via ORAL
  Filled 2021-12-25: qty 1

## 2021-12-25 MED ORDER — SODIUM CHLORIDE 0.9% FLUSH
10.0000 mL | Freq: Three times a day (TID) | INTRAVENOUS | Status: DC
  Administered 2021-12-25 – 2021-12-26 (×3): 10 mL

## 2021-12-25 MED ORDER — OXYCODONE HCL 5 MG PO TABS
5.0000 mg | ORAL_TABLET | ORAL | Status: DC | PRN
  Administered 2021-12-25 – 2021-12-26 (×9): 5 mg via ORAL
  Filled 2021-12-25 (×9): qty 1

## 2021-12-25 MED ORDER — MORPHINE SULFATE (PF) 2 MG/ML IV SOLN
2.0000 mg | INTRAVENOUS | Status: DC | PRN
Start: 2021-12-25 — End: 2021-12-25

## 2021-12-25 NOTE — Progress Notes (Signed)
Initial Nutrition Assessment  DOCUMENTATION CODES:   Not applicable  INTERVENTION:  Continue Ensure Enlive po BID, each supplement provides 350 kcal and 20 grams of protein  Encourage adequate PO intake.   NUTRITION DIAGNOSIS:   Increased nutrient needs related to acute illness (PE) as evidenced by estimated needs.  GOAL:   Patient will meet greater than or equal to 90% of their needs  MONITOR:   PO intake, Supplement acceptance, Skin, Weight trends, Labs, I & O's  REASON FOR ASSESSMENT:   Malnutrition Screening Tool    ASSESSMENT:   71 year old person living with chronic bronchitis, obstructive sleep apnea, hypertension, and recent hospitalization with CAP came in as a direct admit from pulmonologist Imaging was obtained and concerning for loculated pleural effusion.  12/24 - chest tube R placed  Per MD, third foes of pleural lytics given today. Chest test output since placement ~500 ml. Pt with 160 ml output from chest tube today. Per MD, possible plans to remove chest tube on Wednesday if loculated effusion continues to decrease. Pt underwent pleural Fibrinolysis today. Meal completion has been 100%. Pt currently has Ensure ordered and has been consuming them. RD to continue with current orders to aid in caloric and protein needs. Unable to complete Nutrition-Focused physical exam at this time. Unable to obtain usual body weight at this time. Will monitor weight trends.   Per I/O's pt -2.5 L since admission.   Labs and medications reviewed.   Diet Order:   Diet Order             Diet Heart Room service appropriate? Yes; Fluid consistency: Thin  Diet effective now                   EDUCATION NEEDS:   Not appropriate for education at this time  Skin:  Skin Assessment: Reviewed RN Assessment  Last BM:  12/23  Height:   Ht Readings from Last 1 Encounters:  12/22/21 5\' 10"  (1.778 m)    Weight:   Wt Readings from Last 1 Encounters:  12/25/21 88.2 kg    BMI:  Body mass index is 27.9 kg/m.  Estimated Nutritional Needs:   Kcal:  2100-2300  Protein:  105-115 grams  Fluid:  >/= 2 L/day  Corrin Parker, MS, RD, LDN RD pager number/after hours weekend pager number on Amion.

## 2021-12-25 NOTE — Progress Notes (Signed)
° °  Subjective: I seen and evaluated Mr. Lesser at bedside.  He was sitting up in the chair next to the bed.  He did endorse some right-sided tenderness especially with excessive movement.  He states that the pain regimen is helping significantly.  Patient denies shortness of breath.  But endorses some tenderness with deep inspiration.  However, he is hopeful that he will continue to get better.  Objective:  Vital signs in last 24 hours: Vitals:   12/24/21 1350 12/24/21 1951 12/24/21 2320 12/25/21 0433  BP: 133/83 132/68 136/83 132/77  Pulse: 88 80 85 69  Resp: 20 16 19 15   Temp: 98 F (36.7 C) 97.9 F (36.6 C) 98 F (36.7 C) 98.3 F (36.8 C)  TempSrc: Oral Oral Oral Oral  SpO2: 98% 92% 93% 94%  Weight:    88.2 kg  Height:       Physical Exam Constitutional:      General: He is awake. He is not in acute distress. HENT:     Head: Normocephalic and atraumatic.  Cardiovascular:     Rate and Rhythm: Normal rate and regular rhythm.     Heart sounds: Normal heart sounds.  Pulmonary:     Breath sounds: Normal air entry. Examination of the right-lower field reveals decreased breath sounds. Examination of the left-lower field reveals decreased breath sounds. Decreased breath sounds present.     Comments: Tenderness with deep inspiration. Neurological:     Mental Status: He is alert.  Psychiatric:        Behavior: Behavior is cooperative.     Assessment/Plan:  Principal Problem:   Pleural effusion  Pleural effusion Chronic bronchitis Overnight patient reports tenderness following chest tube placement which is not unusual.  Pain management adjusted with increased frequency.  Patient remains afebrile.  Results of pleural fluid analysis reveals gram stain = no organisms; fluid culture -no growth in the last 24 hours and cytology pending. Pt denies SOB. Last dose of fibrinolytic given today by pulmonologist. Repeat CT chest today reveals decrease in loculated effusion which is  reassuring. Repeat imaging tomorrow, if promising, chest tube can be removed, per pulmonology. Likely DC Wednesday if all goes well. --Continue Augmentin 875-125 mg twice daily for 14 days; day 4 for 14 --Pleural fluid cytology pending --Mucinex 600 mg twice daily --Flonase daily --Tylenol 650 every 6 hours as needed for pain --Oxycodone 5 mg every 6 hours as needed for severe pain --Oxycodone 5 mg every 4 hours as needed for pain  HTN Patient continues to be normotensive without medication.  Home medication include amlodipine and losartan.  Patient denies any complaints at this time. --Continue holding amlodipine and losartan  HLD - continue home atorvastatin 20mg     Gout -continue home febuxostat  GERD -continue home pantoprazole   OSA - continue CPAP nightly   Prior to Admission Living Arrangement: Anticipated Discharge Location: Barriers to Discharge: Dispo: Anticipated discharge in approximately 1-2 day(s).   Timothy Lasso, MD 12/25/2021, 6:45 AM Pager: [phone] After 5pm on weekdays and 1pm on weekends: On Call pager [phone]

## 2021-12-25 NOTE — Progress Notes (Signed)
NAME:  Rodney Foster, MRN:  [id number], DOB:  04/30/1950, LOS: 2 ADMISSION DATE:  12/22/2021, CONSULTATION DATE:  12/22/2021 REFERRING MD:  Dr. Jimmye Norman, CHIEF COMPLAINT:  Pleural effusion    History of Present Illness:  Rodney Foster is a 71 y.o. male with a PMH significant for former smoker and HTN who was directly admitted after an outpatient chest CT revealed worsened loculated pleural effusion. Rodney Foster was admitted at this facility from 12/8-12/15 for pneumonia and right pleural effusion with new supplemental oxygen demand. He was initially treated with Cefepime and Vancomycin with later transition to PO Augmentin. His clinical picture improved with conservative measures including ABT's. There was also no clear window for thoracentesis or chest tube placement.   Patient followed with with primary pulmonologist Dr. Erin Fulling on 12/21 and a repeat CXR in office was consist with a persistent right effusion. Decision was made to extent PO Augment and a repeated Chest CT was ordered.   Repeat chest CT was completed 12/22 and a large loculated pleural effusion was seen. Decision was made to direct admit patient for placement of chest tube to evacuate loculated effusion. PCCM consulted for pulmonary consult   Pertinent  Medical History  HTN  Former smoker   Significant Hospital Events: Including procedures, antibiotic start and stop dates in addition to other pertinent events   12/23 direct admit for loculated pleural effusion  12/24 Chest tube placed per IR, first dose of pleural lytics 12/25 Second dose pleural lytic  12/26 Will give third dose of pleural lytics today, ~565mls out of chest tube since insertion   Interim History / Subjective:  Continues to report some pain near chest tube insertion site  Objective   Blood pressure 136/84, pulse 83, temperature 98.1 F (36.7 C), temperature source Oral, resp. rate 15, height 5\' 10"  (1.778 m), weight 88.2 kg, SpO2 94 %.         Intake/Output Summary (Last 24 hours) at 12/25/2021 2376 Last data filed at 12/25/2021 2831 Gross per 24 hour  Intake 920 ml  Output 1760 ml  Net -840 ml    Filed Weights   12/22/21 1623 12/23/21 0424 12/25/21 0433  Weight: 89.6 kg 88.9 kg 88.2 kg    Examination: General: Pleasant elderly male lying in bed in NAD  i HEENT: [place]/AT, MM pink/moist, PERRL,  Neuro: Alert and oriented x3 CV: s1s2 regular rate and rhythm, no murmur, rubs, or gallops,  PULM:  Clear to ascultation bilaterally, no increased work of breathing, right chest tube in place  GI: soft, bowel sounds active in all 4 quadrants, non-tender, non-distended, tolerating oral diet  Extremities: warm/dry, no edema  Skin: no rashes or lesions    Resolved Hospital Problem list     Assessment & Plan:  Right loculated pleural effusion  - s/p CT guided drainage with pigtail catheter 12/24, ~589mls out since chest tube was inserted -Fluid is exudative by lights criteria  -Will complete 3rd dose of lytic 12/26 -CT chest repeated 12/26 and reveled decrease in size of effusion but some fluid persist  P: Continue Augmentin  Give third dose of lytic today then no more  Encourage mobilization  Continue pain control Lidocaine patch    Severe obstructive sleep apnea  -Previously saw Dr. Ander Slade in 2019 and home sleep study revealed severe obstruction  P: Nocturnal CPAP   Best Practice (right click and "Reselect all SmartList Selections" daily)  Per primary   Mahrukh Seguin D. Kenton Kingfisher, NP-C [facility]  Personal contact information can be found on Amion  12/25/2021, 9:31 AM

## 2021-12-25 NOTE — Procedures (Signed)
Pleural Fibrinolytic Administration Procedure Note  Rodney Foster  [id number]  July 21, 1950  Date:12/25/21  Time:10:57 AM   Provider Performing:Khaila Velarde D. Harris   Procedure: Pleural Fibrinolysis Subsequent day (78242)  Indication(s) Fibrinolysis of complicated pleural effusion  Consent Risks of the procedure as well as the alternatives and risks of each were explained to the patient and/or caregiver.  Consent for the procedure was obtained.   Anesthesia None   Time Out Verified patient identification, verified procedure, site/side was marked, verified correct patient position, special equipment/implants available, medications/allergies/relevant history reviewed, required imaging and test results available.   Sterile Technique Hand hygiene, gloves   Procedure Description Existing pleural catheter was cleaned and accessed in sterile manner.  10mg  of tPA in 30cc of saline and 5mg  of dornase in 30cc of sterile water were injected into pleural space using existing pleural catheter.  Catheter will be clamped for 1 hour and then placed back to suction.   Complications/Tolerance None; patient tolerated the procedure well.  EBL None   Specimen(s) None

## 2021-12-25 NOTE — Progress Notes (Signed)
°  Transition of Care [facility]) Screening Note   Patient Details  Name: Rodney Foster Date of Birth: 10-Aug-1950   Transition of Care [facility]) CM/SW Contact:    Bartholomew Crews, RN Phone Number: [phone] 12/25/2021, 12:52 PM    Transition of Care Department [facility]) has reviewed patient and no TOC needs have been identified at this time. We will continue to monitor patient advancement through interdisciplinary progression rounds. If new patient transition needs arise, please place a TOC consult.

## 2021-12-26 ENCOUNTER — Inpatient Hospital Stay (HOSPITAL_COMMUNITY): Payer: Medicare HMO

## 2021-12-26 LAB — CBC WITH DIFFERENTIAL/PLATELET
Abs Immature Granulocytes: 0.03 10*3/uL (ref 0.00–0.07)
Basophils Absolute: 0.1 10*3/uL (ref 0.0–0.1)
Basophils Relative: 1 %
Eosinophils Absolute: 0.2 10*3/uL (ref 0.0–0.5)
Eosinophils Relative: 2 %
HCT: 36.6 % — ABNORMAL LOW (ref 39.0–52.0)
Hemoglobin: 11.9 g/dL — ABNORMAL LOW (ref 13.0–17.0)
Immature Granulocytes: 0 %
Lymphocytes Relative: 38 %
Lymphs Abs: 3.7 10*3/uL (ref 0.7–4.0)
MCH: 31 pg (ref 26.0–34.0)
MCHC: 32.5 g/dL (ref 30.0–36.0)
MCV: 95.3 fL (ref 80.0–100.0)
Monocytes Absolute: 1.1 10*3/uL — ABNORMAL HIGH (ref 0.1–1.0)
Monocytes Relative: 11 %
Neutro Abs: 4.8 10*3/uL (ref 1.7–7.7)
Neutrophils Relative %: 48 %
Platelets: 612 10*3/uL — ABNORMAL HIGH (ref 150–400)
RBC: 3.84 MIL/uL — ABNORMAL LOW (ref 4.22–5.81)
RDW: 12.4 % (ref 11.5–15.5)
WBC: 9.9 10*3/uL (ref 4.0–10.5)
nRBC: 0 % (ref 0.0–0.2)

## 2021-12-26 LAB — CYTOLOGY - NON PAP

## 2021-12-26 LAB — GLUCOSE, CAPILLARY: Glucose-Capillary: 129 mg/dL — ABNORMAL HIGH (ref 70–99)

## 2021-12-26 MED ORDER — OXYCODONE HCL 5 MG PO TABS: 5.0000 mg | ORAL_TABLET | ORAL | 0 refills | Status: AC | PRN

## 2021-12-26 MED ORDER — LIDOCAINE 5 % EX PTCH: 1.0000 | MEDICATED_PATCH | CUTANEOUS | 0 refills | Status: DC

## 2021-12-26 NOTE — Progress Notes (Signed)
NAME:  Rodney Foster, MRN:  [id number], DOB:  08/19/1950, LOS: 3 ADMISSION DATE:  12/22/2021, CONSULTATION DATE:  12/22/2021 REFERRING MD:  Dr. Jimmye Foster, CHIEF COMPLAINT:  Pleural effusion    History of Present Illness:  Rodney Foster is a 71 y.o. male with a PMH significant for former smoker and HTN who was directly admitted after an outpatient chest CT revealed worsened loculated pleural effusion. Rodney Foster was admitted at this facility from 12/8-12/15 for pneumonia and right pleural effusion with new supplemental oxygen demand. He was initially treated with Cefepime and Vancomycin with later transition to PO Augmentin. His clinical picture improved with conservative measures including ABT's. There was also no clear window for thoracentesis or chest tube placement.   Patient followed with with primary pulmonologist Dr. Erin Foster on 12/21 and a repeat CXR in office was consist with a persistent right effusion. Decision was made to extent PO Augment and a repeated Chest CT was ordered.   Repeat chest CT was completed 12/22 and a large loculated pleural effusion was seen. Decision was made to direct admit patient for placement of chest tube to evacuate loculated effusion. PCCM consulted for pulmonary consult   Pertinent  Medical History  HTN  Former smoker   Significant Hospital Events: Including procedures, antibiotic start and stop dates in addition to other pertinent events   12/23 direct admit for loculated pleural effusion  12/24 Chest tube placed per IR, first dose of pleural lytics 12/25 Second dose pleural lytic  12/26 Will give third dose of pleural lytics today, ~535mls out of chest tube since insertion  12/27 Chest tube removed today  Interim History / Subjective:  Tolerated lytics x 3 days. Denies shortness of breath Chest tube removed Follow-up CXR with improved right loculated pleural effusion  Objective   Blood pressure 140/78, pulse 82, temperature 98.3 F (36.8 C),  temperature source Oral, resp. rate 15, height 5\' 10"  (1.778 m), weight 88 kg, SpO2 97 %.        Intake/Output Summary (Last 24 hours) at 12/26/2021 1445 Last data filed at 12/26/2021 1249 Gross per 24 hour  Intake 1440 ml  Output 1740 ml  Net -300 ml   Filed Weights   12/23/21 0424 12/25/21 0433 12/26/21 0411  Weight: 88.9 kg 88.2 kg 88 kg   Physical Exam: General: Elderly-appearing, no acute distress HENT: [place], AT, OP clear, MMM Eyes: EOMI, no scleral icterus Respiratory: Clear to auscultation bilaterally.  No crackles, wheezing or rales. S/p right chest tube removal, gauze in place Cardiovascular: RRR, -M/R/G, no JVD GI: BS+, soft, nontender Extremities:-Edema,-tenderness Neuro: AAO x4, CNII-XII grossly intact Skin: Intact, no rashes or bruising Psych: Normal mood, normal affect     Resolved Hospital Problem list     Assessment & Plan:  Right loculated pleural effusion  - s/p CT guided drainage with pigtail catheter 12/24, ~519mls out since chest tube was inserted -Fluid is exudative by lights criteria  -Complete 3rd dose of lytic 12/26 -CT chest repeated 12/26 and reveled decrease in size of effusion but some fluid persist  -12/27 Removed right chest tube  P: Continue Augmentin for two weeks total Encourage mobilization  Continue pain control Lidocaine patch   Arrange follow-up with Dr. Erin Foster in one month. Will order CT chest without contrast prior to visit.  Severe obstructive sleep apnea  -Previously saw Dr. Ander Slade in 2019 and home sleep study revealed severe obstruction  P: Nocturnal CPAP   Pulmonary will sign off  Best Practice (right  click and "Reselect all SmartList Selections" daily)  Per primary   Rodney Foster, M.D. [facility] 12/26/2021 2:46 PM   See Amion for personal pager For hours between 7 PM to 7 AM, please call Elink for urgent questions

## 2021-12-26 NOTE — Discharge Summary (Signed)
Name: Rodney Foster MRN: [id number] DOB: [birth year] 71 y.o. PCP: [name], NP  Date of Admission: 12/22/2021  3:21 PM Date of Discharge: 12/26/21 Attending Physician: Velna Ochs, MD  Discharge Diagnosis: 1. Pleural effusion/chronic bronchitis 2. HTN 3. HLD 4. Gout 5. GERD 6. OSA  Discharge Medications: Allergies as of 12/26/2021   No Known Allergies      Medication List     TAKE these medications    acetaminophen 500 MG tablet Commonly known as: TYLENOL Take 1,000 mg by mouth every 6 (six) hours as needed for headache (pain).   acyclovir 400 MG tablet Commonly known as: ZOVIRAX TAKE 1 TABLET BY MOUTH EVERY DAY What changed:  how to take this when to take this   amLODipine 5 MG tablet Commonly known as: NORVASC TAKE ONE TABLET BY MOUTH ONCE DAILY TO CONTROL BLOOD PRESSURE What changed: See the new instructions.   amoxicillin-clavulanate 875-125 MG tablet Commonly known as: Augmentin Take 1 tablet by mouth 2 (two) times daily for 14 days.   ANTIOXIDANT PO Take 30 mLs by mouth daily. Aronia Berry Juice   atorvastatin 20 MG tablet Commonly known as: LIPITOR TAKE 1 TABLET BY MOUTH EVERYDAY AT BEDTIME What changed: See the new instructions.   febuxostat 40 MG tablet Commonly known as: ULORIC Take 1 tablet (40 mg total) by mouth daily.   fluticasone 50 MCG/ACT nasal spray Commonly known as: FLONASE Place 1 spray into both nostrils daily. What changed: when to take this   guaiFENesin 600 MG 12 hr tablet Commonly known as: MUCINEX Take 600 mg by mouth 2 (two) times daily.   HYDROcodone-acetaminophen 5-325 MG tablet Commonly known as: NORCO/VICODIN Take 1 tablet by mouth every 6 (six) hours as needed for moderate pain.   lidocaine 5 % Commonly known as: LIDODERM Place 1 patch onto the skin daily. Remove & Discard patch within 12 hours or as directed by MD   loratadine 10 MG tablet Commonly known as: CLARITIN Take 1 tablet (10 mg  total) by mouth daily.   losartan 100 MG tablet Commonly known as: COZAAR TAKE ONE BY MOUTH DAILY FOR BLOOD PRESSURE CONTROL What changed:  how much to take how to take this when to take this additional instructions   oxyCODONE 5 MG immediate release tablet Commonly known as: Oxy IR/ROXICODONE Take 1 tablet (5 mg total) by mouth every 4 (four) hours as needed for up to 3 days for severe pain.   tadalafil 20 MG tablet Commonly known as: CIALIS Take 1 tablet (20 mg total) by mouth daily as needed for erectile dysfunction.   zolpidem 5 MG tablet Commonly known as: AMBIEN Take one tablet by mouth at bedtime as needed for sleep What changed:  how much to take how to take this when to take this additional instructions        Disposition and follow-up:   Mr.Pernell D Wiley was discharged from [facility] in Good condition.  At the hospital follow up visit please address:  1.  Follow-up with Dr. Erin Fulling in 4 weeks, CT of chest needed  prior to appt pulmonologist; complete Augmentin antibiotic regimen (14 days); 5 days completed inpatient; continue for 9 more days.  2.  Labs / imaging needed at time of follow-up: CT chest  3.  Pending labs/ test needing follow-up: None  Follow-up Appointments:  Follow-up Information     Freddi Starr, MD Follow up in 1 month(s).   Specialty: Pulmonary Disease Contact information:  [place] [address] [phone]         [name], NP Follow up in 1 week(s).   Specialty: Geriatric Medicine Contact information: [place]. [place] [address] [phone]                 Hospital Course by problem list: Patient was a previously admitted 12/06/2021 for CAP.  Patient was started on ceftriaxone and supplemental oxygen.  Patient was then placed on azithromycin and weaned off supplemental oxygen.  Patient was discharged on 12/14/2021 with 7-day course of Augmentin.   Patient presented for hospital follow-up with pulmonologist on 12/21/2021; CT of the chest was obtained and concerning for loculated pleural effusion.  Patient was admitted 12/22/2021.  During hospitalization, IR placed chest tube and drained successfully; repeat chest imaging showed decrease in loculated effusion.  Patient placed on Augmentin throughout hospital course, expected to finish 14-day course; Day 5 of 14 at discharge.  Patient continues to improve clinically; patient denies shortness of breath.  Pain control with oxycodone following chest tube placement given, provided moderate relief. Patient was satting well on room air.  All other chronic conditions including HTN, HLD, gout, GERD, OSA managed appropriately.  Patient discharged in stable condition.  Discharge Exam:   BP 140/78 (BP Location: Right Arm)    Pulse 82    Temp 98.3 F (36.8 C) (Oral)    Resp 15    Ht 5\' 10"  (1.778 m)    Wt 88 kg    SpO2 97%    BMI 27.84 kg/m  Discharge exam: Physical Exam Constitutional:      General: He is awake. He is not in acute distress. HENT:     Head: Normocephalic and atraumatic.  Cardiovascular:     Rate and Rhythm: Normal rate and regular rhythm.     Heart sounds: Normal heart sounds.  Pulmonary:     Effort: Pulmonary effort is normal.     Breath sounds: Normal air entry.  Abdominal:     General: Abdomen is flat.  Skin:    General: Skin is warm and dry.  Neurological:     General: No focal deficit present.     Mental Status: He is alert.  Psychiatric:        Behavior: Behavior normal. Behavior is cooperative.     Pertinent Labs, Studies, and Procedures:   CT CHEST WO CONTRAST  Result Date: 12/25/2021 CLINICAL DATA:  Empyema EXAM: CT CHEST WITHOUT CONTRAST TECHNIQUE: Multidetector CT imaging of the chest was performed following the standard protocol without IV contrast. COMPARISON:  12/21/2021 FINDINGS: Cardiovascular: Heart is normal in size.  No pericardial effusion. No evidence  of thoracic aortic aneurysm. Mild atherosclerotic calcifications of the arch. Mild coronary atherosclerosis of the LAD. Mediastinum/Nodes: 15 mm short axis subcarinal node, likely reactive. Additional small mediastinal lymph nodes which do not meet pathologic CT size criteria. Visualized thyroid is unremarkable. Lungs/Pleura: Right lower lobe opacity/pneumonia (series 4/image 105)). Additional small right pleural effusion, partially loculated (series 4/image 92), with an indwelling pigtail drainage catheter (series 3/image 78). This has decreased from the prior due to interval intervention. Mild centrilobular and paraseptal emphysematous changes, upper lung predominant. Mild left lower lobe scarring/atelectasis. No suspicious pulmonary nodules. No pneumothorax. Upper Abdomen: Visualized upper abdomen is notable for layering tiny gallstones, without associated inflammatory changes. Musculoskeletal: Mild degenerative changes of the lower thoracic/upper lumbar spine. Mild superior endplate compression fracture deformity at L1, chronic. Mild sclerosis along the  posterior aspect of the T11 vertebral body (sagittal image 109). These findings are chronic. IMPRESSION: Right lower lobe pneumonia. Small right pleural effusion with indwelling pigtail drainage catheter, improved. Aortic Atherosclerosis (ICD10-I70.0) and Emphysema (ICD10-J43.9). Electronically Signed   By: Julian Hy M.D.   On: 12/25/2021 05:20   DG CHEST PORT 1 VIEW  Result Date: 12/26/2021 CLINICAL DATA:  71 year old male with history of chest tube placement. EXAM: PORTABLE CHEST 1 VIEW COMPARISON:  Chest x-ray 12/22/2021. FINDINGS: Compared to the prior study there has been interval placement of a small bore right-sided chest tube with pigtail reformed in the lateral aspect of the right mid hemithorax. Previously noted partially loculated right-sided pleural effusion has decreased in size. Opacity in the right lung base may reflect areas of  atelectasis and/or consolidation. Probable subsegmental atelectasis also noted in the medial aspect of the left lower lobe. No left pleural effusion. No pneumothorax. No evidence of pulmonary edema. Heart size is normal. Upper mediastinal contours are within normal limits. IMPRESSION: 1. Right lower lobe pneumonia with small right parapneumonic pleural effusion, decreased in size compared to the prior examination following right-sided chest tube placement which appears appropriately located, as above. Electronically Signed   By: Vinnie Langton M.D.   On: 12/26/2021 05:54     Discharge Instructions: Discharge Instructions     Call MD for:  difficulty breathing, headache or visual disturbances   Complete by: As directed    Call MD for:  extreme fatigue   Complete by: As directed    Call MD for:  hives   Complete by: As directed    Call MD for:  persistant dizziness or light-headedness   Complete by: As directed    Call MD for:  persistant nausea and vomiting   Complete by: As directed    Call MD for:  redness, tenderness, or signs of infection (pain, swelling, redness, odor or green/yellow discharge around incision site)   Complete by: As directed    Call MD for:  severe uncontrolled pain   Complete by: As directed    Call MD for:  temperature >100.4   Complete by: As directed    Diet - low sodium heart healthy   Complete by: As directed    Increase activity slowly   Complete by: As directed    Remove dressing in 24 hours   Complete by: As directed    Keep area clean and dry. Place a bandage on during the day for 3 days. Remove bandage at night.       Signed: Timothy Lasso, MD 12/26/2021, 3:05 PM   Pager: [phone]

## 2021-12-26 NOTE — Care Management Important Message (Signed)
Important Message  Patient Details  Name: Rodney Foster MRN: [id number] Date of Birth: 1950-04-16   Medicare Important Message Given:  Yes     [name] 12/26/2021, 8:07 AM

## 2021-12-26 NOTE — Progress Notes (Signed)
Patient d/c from unit. Discharge instructions given and education completed. IV and telemetry removed.   Raelyn Number, RN 12/26/21 4:20 PM

## 2021-12-26 NOTE — Discharge Instructions (Signed)
Please continue take your Augmentin twice a day for the next 9 days in order to complete the antibiotic regimen.  Last day 01/04/2021  Continue to be as active as possible; you have no restrictions or limitations.  Lidocaine patch and pain medicine provided for pain relief.  You may still have some tenderness around the area for several days.  Please follow-up with Dr. Erin Fulling in 4 weeks; be sure to have a repeat image of the chest prior to the appointment.  Hope you feel better!

## 2021-12-26 NOTE — Plan of Care (Signed)
  Problem: Health Behavior/Discharge Planning: Goal: Ability to manage health-related needs will improve Outcome: Progressing   Problem: Clinical Measurements: Goal: Ability to maintain clinical measurements within normal limits will improve Outcome: Progressing Goal: Will remain free from infection Outcome: Progressing   

## 2021-12-27 ENCOUNTER — Telehealth: Payer: Self-pay | Admitting: Pulmonary Disease

## 2021-12-27 ENCOUNTER — Telehealth: Payer: Self-pay | Admitting: *Deleted

## 2021-12-27 DIAGNOSIS — J90 Pleural effusion, not elsewhere classified: Secondary | ICD-10-CM

## 2021-12-27 NOTE — Telephone Encounter (Signed)
Transition Care Management Unsuccessful Follow-up Telephone Call  Date of discharge and from where:  12/26/2021 [facility]  Attempts:  1st Attempt  Reason for unsuccessful TCM follow-up call:  Left voice message to return call.

## 2021-12-27 NOTE — Telephone Encounter (Signed)
I have left a message for the patient to make an appointment with Dr. Erin Fulling per Dr. Loanne Drilling.  Dr. Erin Fulling does this patient need a repeat CT scan in January 2023. He had one on 12/25/21. Please advise.

## 2021-12-27 NOTE — Telephone Encounter (Signed)
-----   Message from Owen, MD sent at 12/26/2021  2:57 PM EST ----- Regarding: Order outpatient CT and follow-up with Dr. Erin Fulling Staff,  Please order CT chest without contrast in 4 weeks (Jan 23-27) with follow-up with Dr. Erin Fulling to review imaging  Thanks, JE

## 2021-12-28 ENCOUNTER — Other Ambulatory Visit: Payer: Self-pay

## 2021-12-28 LAB — AEROBIC/ANAEROBIC CULTURE W GRAM STAIN (SURGICAL/DEEP WOUND)
Culture: NO GROWTH
Gram Stain: NONE SEEN

## 2021-12-28 NOTE — Telephone Encounter (Signed)
Transition Care Management Unsuccessful Follow-up Telephone Call  Date of discharge and from where:  12/26/2021 [place]  Attempts:  2nd Attempt  Reason for unsuccessful TCM follow-up call:  Left voice message with [facility], she stated that patient was sleeping and she will have him give [place] a call when he awakes.

## 2021-12-29 ENCOUNTER — Ambulatory Visit: Payer: Medicare HMO | Admitting: Pulmonary Disease

## 2021-12-29 ENCOUNTER — Encounter: Payer: Self-pay | Admitting: Pulmonary Disease

## 2021-12-29 ENCOUNTER — Other Ambulatory Visit: Payer: Self-pay

## 2021-12-29 VITALS — BP 124/70 | HR 90 | Ht 70.0 in | Wt 199.0 lb

## 2021-12-29 DIAGNOSIS — J90 Pleural effusion, not elsewhere classified: Secondary | ICD-10-CM | POA: Diagnosis not present

## 2021-12-29 NOTE — Progress Notes (Signed)
Synopsis: Referred in December 2022 for hospital follow up for pneumonia and pleural effusion.  Subjective:   PATIENT ID: Rodney Foster GENDER: male DOB: 1950/01/24, MRN: [id number]   HPI  Chief Complaint  Patient presents with   Follow-up    HFU. States he has been doing well since being home from hospital.    Ariv Penrod is a 71 year old male, former smoker with hypertension who returns to pulmonary clinic for hospital follow up due to pneumonia and pleural effusion.   Patient was directly admitted after repeat CT Chest on 12/22 showed persistent loculated effusion. Chest tube was placed by IR and TPA/DNAse therapy x 3 doses with good output. Repeat CT Chest on 12/26 showed improvement of the loculated effusion. The chest tube was removed on 12/27 and he was discharged home with continuation of augmentin therapy.  Patient is feeling better since hospitalization. Right sided chest pain continues to improve. No fevers or chills.  OV 12/20/21 He was admitted 12/8 to 12/15 for pneumonia and right pleural effusion with new oxygen need.  Patient was initially treated with IV antibiotics with cefepime and vancomycin with transition to Augmentin therapy since discharge.  He had downtrending CRP, WBC count along with return to room air removing supplemental oxygen upon admission.  Given his serologic markers and clinical improvements he was discharged with close follow-up.  Chest radiograph today shows improvement of the right lung infiltrates and slight improvement of the right pleural effusion. The right effusion does appear to be loculated. He continues to have some discomfort of the right chest but this has also improved. He denies fevers, chills or sweats.   No chest tube or thoracentesis was performed during hospitalization as patient was clinically improving and there was not a clear window for procedure based on bedside ultrasound.  Past Medical History:  Diagnosis Date   Backache,  unspecified    Bronchitis    Cough variant asthma 05/03/2017   Genital herpes    Gout    Hearing loss    Hemorrhoids    Hernia    Herpes simplex    Hyperglycemia    Hyperlipidemia    Hypertension    Impotence    Insomnia, unspecified    Osteoarthrosis, unspecified whether generalized or localized, unspecified site    Tinea pedis, right    Unspecified disorder of male genital organs      Family History  Problem Relation Age of Onset   COPD Mother    Aortic aneurysm Father      Social History   Socioeconomic History   Marital status: Divorced    Spouse name: Not on file   Number of children: Not on file   Years of education: Not on file   Highest education level: Not on file  Occupational History   Not on file  Tobacco Use   Smoking status: Former    Packs/day: 1.00    Years: 15.00    Pack years: 15.00    Types: Cigarettes    Quit date: 01/01/1984    Years since quitting: 38.0   Smokeless tobacco: Former    Quit date: 1990  Scientific laboratory technician Use: Never used  Substance and Sexual Activity   Alcohol use: Yes    Alcohol/week: 6.0 - 7.0 standard drinks    Types: 6 - 7 Glasses of wine per week    Comment: 3-4 a night of wine or cocktail   Drug use: No   Sexual activity:  Yes  Other Topics Concern   Not on file  Social History Narrative   Not on file   Social Determinants of Health   Financial Resource Strain: Not on file  Food Insecurity: Not on file  Transportation Needs: Not on file  Physical Activity: Not on file  Stress: Not on file  Social Connections: Not on file  Intimate Partner Violence: Not on file     No Known Allergies   Outpatient Medications Prior to Visit  Medication Sig Dispense Refill   acetaminophen (TYLENOL) 500 MG tablet Take 1,000 mg by mouth every 6 (six) hours as needed for headache (pain).     acyclovir (ZOVIRAX) 400 MG tablet TAKE 1 TABLET BY MOUTH EVERY DAY (Patient taking differently: 400 mg every morning.) 90 tablet 1    amLODipine (NORVASC) 5 MG tablet TAKE ONE TABLET BY MOUTH ONCE DAILY TO CONTROL BLOOD PRESSURE (Patient taking differently: Take 5 mg by mouth every morning. to control blood pressure) 90 tablet 1   amoxicillin-clavulanate (AUGMENTIN) 875-125 MG tablet Take 1 tablet by mouth 2 (two) times daily for 14 days. 28 tablet 0   atorvastatin (LIPITOR) 20 MG tablet TAKE 1 TABLET BY MOUTH EVERYDAY AT BEDTIME (Patient taking differently: Take 20 mg by mouth every morning.) 90 tablet 2   febuxostat (ULORIC) 40 MG tablet Take 1 tablet (40 mg total) by mouth daily. 90 tablet 1   fluticasone (FLONASE) 50 MCG/ACT nasal spray Place 1 spray into both nostrils daily. (Patient taking differently: Place 1 spray into both nostrils at bedtime.) 16 g 0   guaiFENesin (MUCINEX) 600 MG 12 hr tablet Take 600 mg by mouth 2 (two) times daily.     HYDROcodone-acetaminophen (NORCO/VICODIN) 5-325 MG tablet Take 1 tablet by mouth every 6 (six) hours as needed for moderate pain.     lidocaine (LIDODERM) 5 % Place 1 patch onto the skin daily. Remove & Discard patch within 12 hours or as directed by MD 30 patch 0   loratadine (CLARITIN) 10 MG tablet Take 1 tablet (10 mg total) by mouth daily. 30 tablet 0   losartan (COZAAR) 100 MG tablet TAKE ONE BY MOUTH DAILY FOR BLOOD PRESSURE CONTROL (Patient taking differently: Take 100 mg by mouth every morning. FOR BLOOD PRESSURE CONTROL) 90 tablet 1   Multiple Vitamins-Minerals (ANTIOXIDANT PO) Take 30 mLs by mouth daily. Aronia Berry Juice     oxyCODONE (OXY IR/ROXICODONE) 5 MG immediate release tablet Take 1 tablet (5 mg total) by mouth every 4 (four) hours as needed for up to 3 days for severe pain. 18 tablet 0   tadalafil (CIALIS) 20 MG tablet Take 1 tablet (20 mg total) by mouth daily as needed for erectile dysfunction. 10 tablet 0   zolpidem (AMBIEN) 5 MG tablet Take one tablet by mouth at bedtime as needed for sleep (Patient taking differently: Take 5 mg by mouth at bedtime.) 30 tablet 0    No facility-administered medications prior to visit.    Review of Systems  Constitutional:  Negative for chills, fever, malaise/fatigue and weight loss.  HENT:  Negative for congestion, sinus pain and sore throat.   Eyes: Negative.   Respiratory:  Negative for hemoptysis, sputum production, shortness of breath and wheezing.   Cardiovascular:  Negative for chest pain, palpitations, orthopnea, claudication and leg swelling.  Gastrointestinal:  Negative for abdominal pain, heartburn, nausea and vomiting.  Genitourinary: Negative.   Musculoskeletal:  Negative for joint pain and myalgias.  Skin:  Negative for rash.  Neurological:  Negative for weakness.  Endo/Heme/Allergies: Negative.   Psychiatric/Behavioral: Negative.       Objective:   Vitals:   12/29/21 1116  BP: 124/70  Pulse: 90  SpO2: 97%  Weight: 199 lb (90.3 kg)  Height: 5\' 10"  (1.778 m)    Physical Exam Constitutional:      General: He is not in acute distress. HENT:     Head: Normocephalic and atraumatic.  Eyes:     Conjunctiva/sclera: Conjunctivae normal.  Cardiovascular:     Rate and Rhythm: Normal rate and regular rhythm.     Pulses: Normal pulses.     Heart sounds: Normal heart sounds. No murmur heard. Pulmonary:     Breath sounds: Examination of the right-lower field reveals decreased breath sounds. Decreased breath sounds present.  Musculoskeletal:     Right lower leg: No edema.     Left lower leg: No edema.  Skin:    General: Skin is warm and dry.  Neurological:     General: No focal deficit present.     Mental Status: He is alert.  Psychiatric:        Mood and Affect: Mood normal.        Behavior: Behavior normal.        Thought Content: Thought content normal.        Judgment: Judgment normal.    CBC    Component Value Date/Time   WBC 9.9 12/26/2021 0219   RBC 3.84 (L) 12/26/2021 0219   HGB 11.9 (L) 12/26/2021 0219   HCT 36.6 (L) 12/26/2021 0219   PLT 612 (H) 12/26/2021 0219   MCV  95.3 12/26/2021 0219   MCH 31.0 12/26/2021 0219   MCHC 32.5 12/26/2021 0219   RDW 12.4 12/26/2021 0219   LYMPHSABS 3.7 12/26/2021 0219   MONOABS 1.1 (H) 12/26/2021 0219   EOSABS 0.2 12/26/2021 0219   BASOSABS 0.1 12/26/2021 0219   BMP Latest Ref Rng & Units 12/24/2021 12/23/2021 12/22/2021  Glucose 70 - 99 mg/dL 203(H) 107(H) 105(H)  BUN 8 - 23 mg/dL 10 9 9   Creatinine 0.61 - 1.24 mg/dL 0.80 0.94 0.78  BUN/Creat Ratio 6 - 22 (calc) - - -  Sodium 135 - 145 mmol/L 133(L) 135 133(L)  Potassium 3.5 - 5.1 mmol/L 3.7 4.3 4.2  Chloride 98 - 111 mmol/L 99 99 97(L)  CO2 22 - 32 mmol/L 24 25 26   Calcium 8.9 - 10.3 mg/dL 9.1 9.3 9.1   Chest imaging: CXR 12/20/21 1. Large loculated right pleural effusion, decreased in size from 12/13/2021, worrisome for empyema. 2. Right basilar airspace opacification may be due to pneumonia. 3. Streaky atelectasis in the left lower lobe.   PFT: No flowsheet data found.  Labs:  Path:  Echo 12/11/21: LV EF 45-50%. LV mildly dilated. Grade I diastolic dysfunction. RV size and function is normal.   Heart Catheterization:  Assessment & Plan:   Loculated pleural effusion - Plan: DG Chest 2 View  Discussion: Chaitanya Amedee is a 71 year old male, former smoker with hypertension who returns to pulmonary clinic for hospital follow up due to pneumonia and pleural effusion.   He is s/p chest tube and TPA/DNAse therapy for loculated right pleural effusion with improvement in his symptoms. He is completing a prolonged course of augmentin.   We will follow up in 1 month with repeat chest x-ray to monitor his effusion. We will consider further CT chest imaging at that time.  Freda Jackson, MD [facility] Office: [phone]  Current Outpatient Medications:    acetaminophen (TYLENOL) 500 MG tablet, Take 1,000 mg by mouth every 6 (six) hours as needed for headache (pain)., Disp: , Rfl:    acyclovir (ZOVIRAX) 400 MG tablet, TAKE 1  TABLET BY MOUTH EVERY DAY (Patient taking differently: 400 mg every morning.), Disp: 90 tablet, Rfl: 1   amLODipine (NORVASC) 5 MG tablet, TAKE ONE TABLET BY MOUTH ONCE DAILY TO CONTROL BLOOD PRESSURE (Patient taking differently: Take 5 mg by mouth every morning. to control blood pressure), Disp: 90 tablet, Rfl: 1   amoxicillin-clavulanate (AUGMENTIN) 875-125 MG tablet, Take 1 tablet by mouth 2 (two) times daily for 14 days., Disp: 28 tablet, Rfl: 0   atorvastatin (LIPITOR) 20 MG tablet, TAKE 1 TABLET BY MOUTH EVERYDAY AT BEDTIME (Patient taking differently: Take 20 mg by mouth every morning.), Disp: 90 tablet, Rfl: 2   febuxostat (ULORIC) 40 MG tablet, Take 1 tablet (40 mg total) by mouth daily., Disp: 90 tablet, Rfl: 1   fluticasone (FLONASE) 50 MCG/ACT nasal spray, Place 1 spray into both nostrils daily. (Patient taking differently: Place 1 spray into both nostrils at bedtime.), Disp: 16 g, Rfl: 0   guaiFENesin (MUCINEX) 600 MG 12 hr tablet, Take 600 mg by mouth 2 (two) times daily., Disp: , Rfl:    HYDROcodone-acetaminophen (NORCO/VICODIN) 5-325 MG tablet, Take 1 tablet by mouth every 6 (six) hours as needed for moderate pain., Disp: , Rfl:    lidocaine (LIDODERM) 5 %, Place 1 patch onto the skin daily. Remove & Discard patch within 12 hours or as directed by MD, Disp: 30 patch, Rfl: 0   loratadine (CLARITIN) 10 MG tablet, Take 1 tablet (10 mg total) by mouth daily., Disp: 30 tablet, Rfl: 0   losartan (COZAAR) 100 MG tablet, TAKE ONE BY MOUTH DAILY FOR BLOOD PRESSURE CONTROL (Patient taking differently: Take 100 mg by mouth every morning. FOR BLOOD PRESSURE CONTROL), Disp: 90 tablet, Rfl: 1   Multiple Vitamins-Minerals (ANTIOXIDANT PO), Take 30 mLs by mouth daily. Aronia Berry Juice, Disp: , Rfl:    oxyCODONE (OXY IR/ROXICODONE) 5 MG immediate release tablet, Take 1 tablet (5 mg total) by mouth every 4 (four) hours as needed for up to 3 days for severe pain., Disp: 18 tablet, Rfl: 0   tadalafil  (CIALIS) 20 MG tablet, Take 1 tablet (20 mg total) by mouth daily as needed for erectile dysfunction., Disp: 10 tablet, Rfl: 0   zolpidem (AMBIEN) 5 MG tablet, Take one tablet by mouth at bedtime as needed for sleep (Patient taking differently: Take 5 mg by mouth at bedtime.), Disp: 30 tablet, Rfl: 0

## 2021-12-29 NOTE — Patient Instructions (Addendum)
Continue to finish your course of augmentin  Call us if you develop fevers, chest pain, or shortness of breath  Follow up in 1 month with chest x-ray

## 2021-12-29 NOTE — Telephone Encounter (Signed)
Transition Care Management Unsuccessful Follow-up Telephone Call  Date of discharge and from where:  12/26/2021 [place]  Attempts:  3rd Attempt  Reason for unsuccessful TCM follow-up call:  No Answer. Mailbox is full

## 2022-01-02 ENCOUNTER — Telehealth: Payer: Self-pay

## 2022-01-02 DIAGNOSIS — G4700 Insomnia, unspecified: Secondary | ICD-10-CM

## 2022-01-02 MED ORDER — ZOLPIDEM TARTRATE 5 MG PO TABS: ORAL_TABLET | ORAL | 5 refills | Status: DC

## 2022-01-02 NOTE — Telephone Encounter (Signed)
Noted  

## 2022-01-02 NOTE — Telephone Encounter (Signed)
RX last refilled on 11/07/21 # 30/0 refills  Treatment agreement is outdated, notation made on pending appointment for April 2023 to update

## 2022-01-04 DIAGNOSIS — M545 Low back pain, unspecified: Secondary | ICD-10-CM | POA: Diagnosis not present

## 2022-01-30 ENCOUNTER — Encounter: Payer: Self-pay | Admitting: Pulmonary Disease

## 2022-01-30 ENCOUNTER — Ambulatory Visit: Payer: Medicare HMO | Admitting: Pulmonary Disease

## 2022-01-30 ENCOUNTER — Other Ambulatory Visit: Payer: Self-pay

## 2022-01-30 ENCOUNTER — Ambulatory Visit (INDEPENDENT_AMBULATORY_CARE_PROVIDER_SITE_OTHER): Payer: Medicare HMO

## 2022-01-30 VITALS — BP 124/76 | HR 81 | Ht 70.0 in | Wt 204.0 lb

## 2022-01-30 DIAGNOSIS — J90 Pleural effusion, not elsewhere classified: Secondary | ICD-10-CM

## 2022-01-30 NOTE — Patient Instructions (Signed)
Your chest x-ray looks better today, you still have fluid around the base of your right lung that will likely be permanent.   Follow up as needed if you have right sided chest pain, shortness of breath, cough with sputum production.

## 2022-01-30 NOTE — Progress Notes (Signed)
Synopsis: Referred in December 2022 for hospital follow up for pneumonia and pleural effusion.  Subjective:   PATIENT ID: Rodney Foster GENDER: male DOB: Nov 13, 1950, MRN: [id number]   HPI  Chief Complaint  Patient presents with   Follow-up    1 mo f/u. States he has been doing well since last visit.    Rodney Foster is a 72 year old male, former smoker with hypertension who returns to pulmonary clinic for follow up of pneumonia and pleural effusion.   He has done well since last visit. He denies any cough or sputum production. He denies fevers, chills or sweats. He has some mild discomfort at the chest tube site. He feels his breathing is back to baseline.   Chest radiograph today shows resolving opacities and small persistent right pleural effusion that is overall improved.   OV 12/29/21 Patient was directly admitted after repeat CT Chest on 12/22 showed persistent loculated effusion. Chest tube was placed by IR and TPA/DNAse therapy x 3 doses with good output. Repeat CT Chest on 12/26 showed improvement of the loculated effusion. The chest tube was removed on 12/27 and he was discharged home with continuation of augmentin therapy.  Patient is feeling better since hospitalization. Right sided chest pain continues to improve. No fevers or chills.  OV 12/20/21 He was admitted 12/8 to 12/15 for pneumonia and right pleural effusion with new oxygen need.  Patient was initially treated with IV antibiotics with cefepime and vancomycin with transition to Augmentin therapy since discharge.  He had downtrending CRP, WBC count along with return to room air removing supplemental oxygen upon admission.  Given his serologic markers and clinical improvements he was discharged with close follow-up.  Chest radiograph today shows improvement of the right lung infiltrates and slight improvement of the right pleural effusion. The right effusion does appear to be loculated. He continues to have some discomfort  of the right chest but this has also improved. He denies fevers, chills or sweats.   No chest tube or thoracentesis was performed during hospitalization as patient was clinically improving and there was not a clear window for procedure based on bedside ultrasound.  Past Medical History:  Diagnosis Date   Backache, unspecified    Bronchitis    Cough variant asthma 05/03/2017   Genital herpes    Gout    Hearing loss    Hemorrhoids    Hernia    Herpes simplex    Hyperglycemia    Hyperlipidemia    Hypertension    Impotence    Insomnia, unspecified    Osteoarthrosis, unspecified whether generalized or localized, unspecified site    Tinea pedis, right    Unspecified disorder of male genital organs      Family History  Problem Relation Age of Onset   COPD Mother    Aortic aneurysm Father      Social History   Socioeconomic History   Marital status: Divorced    Spouse name: Not on file   Number of children: Not on file   Years of education: Not on file   Highest education level: Not on file  Occupational History   Not on file  Tobacco Use   Smoking status: Former    Packs/day: 1.00    Years: 15.00    Pack years: 15.00    Types: Cigarettes    Quit date: 01/01/1984    Years since quitting: 38.1   Smokeless tobacco: Former    Quit date: Kenneth  Use   Vaping Use: Never used  Substance and Sexual Activity   Alcohol use: Yes    Alcohol/week: 6.0 - 7.0 standard drinks    Types: 6 - 7 Glasses of wine per week    Comment: 3-4 a night of wine or cocktail   Drug use: No   Sexual activity: Yes  Other Topics Concern   Not on file  Social History Narrative   Not on file   Social Determinants of Health   Financial Resource Strain: Not on file  Food Insecurity: Not on file  Transportation Needs: Not on file  Physical Activity: Not on file  Stress: Not on file  Social Connections: Not on file  Intimate Partner Violence: Not on file     No Known Allergies    Outpatient Medications Prior to Visit  Medication Sig Dispense Refill   acetaminophen (TYLENOL) 500 MG tablet Take 1,000 mg by mouth every 6 (six) hours as needed for headache (pain).     acyclovir (ZOVIRAX) 400 MG tablet TAKE 1 TABLET BY MOUTH EVERY DAY (Patient taking differently: 400 mg every morning.) 90 tablet 1   amLODipine (NORVASC) 5 MG tablet TAKE ONE TABLET BY MOUTH ONCE DAILY TO CONTROL BLOOD PRESSURE (Patient taking differently: Take 5 mg by mouth every morning. to control blood pressure) 90 tablet 1   atorvastatin (LIPITOR) 20 MG tablet TAKE 1 TABLET BY MOUTH EVERYDAY AT BEDTIME (Patient taking differently: Take 20 mg by mouth every morning.) 90 tablet 2   febuxostat (ULORIC) 40 MG tablet Take 1 tablet (40 mg total) by mouth daily. 90 tablet 1   fluticasone (FLONASE) 50 MCG/ACT nasal spray Place 1 spray into both nostrils daily. (Patient taking differently: Place 1 spray into both nostrils at bedtime.) 16 g 0   guaiFENesin (MUCINEX) 600 MG 12 hr tablet Take 600 mg by mouth 2 (two) times daily.     HYDROcodone-acetaminophen (NORCO/VICODIN) 5-325 MG tablet Take 1 tablet by mouth every 6 (six) hours as needed for moderate pain.     lidocaine (LIDODERM) 5 % Place 1 patch onto the skin daily. Remove & Discard patch within 12 hours or as directed by MD 30 patch 0   loratadine (CLARITIN) 10 MG tablet Take 1 tablet (10 mg total) by mouth daily. 30 tablet 0   losartan (COZAAR) 100 MG tablet TAKE ONE BY MOUTH DAILY FOR BLOOD PRESSURE CONTROL (Patient taking differently: Take 100 mg by mouth every morning. FOR BLOOD PRESSURE CONTROL) 90 tablet 1   Multiple Vitamins-Minerals (ANTIOXIDANT PO) Take 30 mLs by mouth daily. Aronia Berry Juice     tadalafil (CIALIS) 20 MG tablet Take 1 tablet (20 mg total) by mouth daily as needed for erectile dysfunction. 10 tablet 0   zolpidem (AMBIEN) 5 MG tablet Take one tablet by mouth at bedtime as needed for sleep 30 tablet 5   No facility-administered  medications prior to visit.    Review of Systems  Constitutional:  Negative for chills, fever, malaise/fatigue and weight loss.  HENT:  Negative for congestion, sinus pain and sore throat.   Eyes: Negative.   Respiratory:  Negative for hemoptysis, sputum production, shortness of breath and wheezing.   Cardiovascular:  Negative for chest pain, palpitations, orthopnea, claudication and leg swelling.  Gastrointestinal:  Negative for abdominal pain, heartburn, nausea and vomiting.  Genitourinary: Negative.   Musculoskeletal:  Negative for joint pain and myalgias.  Skin:  Negative for rash.  Neurological:  Negative for weakness.  Endo/Heme/Allergies: Negative.  Psychiatric/Behavioral: Negative.       Objective:   Vitals:   01/30/22 1120  BP: 124/76  Pulse: 81  SpO2: 98%  Weight: 204 lb (92.5 kg)  Height: 5\' 10"  (1.778 m)    Physical Exam Constitutional:      General: He is not in acute distress. HENT:     Head: Normocephalic and atraumatic.  Eyes:     Conjunctiva/sclera: Conjunctivae normal.  Cardiovascular:     Rate and Rhythm: Normal rate and regular rhythm.     Pulses: Normal pulses.     Heart sounds: Normal heart sounds. No murmur heard. Pulmonary:     Effort: Pulmonary effort is normal.     Breath sounds: No decreased breath sounds, wheezing or rhonchi.  Musculoskeletal:     Right lower leg: No edema.     Left lower leg: No edema.  Skin:    General: Skin is warm and dry.  Neurological:     General: No focal deficit present.     Mental Status: He is alert.  Psychiatric:        Mood and Affect: Mood normal.        Behavior: Behavior normal.        Thought Content: Thought content normal.        Judgment: Judgment normal.    CBC    Component Value Date/Time   WBC 9.9 12/26/2021 0219   RBC 3.84 (L) 12/26/2021 0219   HGB 11.9 (L) 12/26/2021 0219   HCT 36.6 (L) 12/26/2021 0219   PLT 612 (H) 12/26/2021 0219   MCV 95.3 12/26/2021 0219   MCH 31.0 12/26/2021  0219   MCHC 32.5 12/26/2021 0219   RDW 12.4 12/26/2021 0219   LYMPHSABS 3.7 12/26/2021 0219   MONOABS 1.1 (H) 12/26/2021 0219   EOSABS 0.2 12/26/2021 0219   BASOSABS 0.1 12/26/2021 0219   BMP Latest Ref Rng & Units 12/24/2021 12/23/2021 12/22/2021  Glucose 70 - 99 mg/dL 203(H) 107(H) 105(H)  BUN 8 - 23 mg/dL 10 9 9   Creatinine 0.61 - 1.24 mg/dL 0.80 0.94 0.78  BUN/Creat Ratio 6 - 22 (calc) - - -  Sodium 135 - 145 mmol/L 133(L) 135 133(L)  Potassium 3.5 - 5.1 mmol/L 3.7 4.3 4.2  Chloride 98 - 111 mmol/L 99 99 97(L)  CO2 22 - 32 mmol/L 24 25 26   Calcium 8.9 - 10.3 mg/dL 9.1 9.3 9.1   Chest imaging: CXR 01/30/21 Small right pleural effusion with adjacent opacity in the right lower lobe, overall stable to slightly improved since 12/26/2021. No new or worsening focal airspace disease.  CXR 12/20/21 1. Large loculated right pleural effusion, decreased in size from 12/13/2021, worrisome for empyema. 2. Right basilar airspace opacification may be due to pneumonia. 3. Streaky atelectasis in the left lower lobe.   PFT: No flowsheet data found.  Labs:  Path:  Echo 12/11/21: LV EF 45-50%. LV mildly dilated. Grade I diastolic dysfunction. RV size and function is normal.   Heart Catheterization:  Assessment & Plan:   Loculated pleural effusion  Discussion: Rodney Foster is a 72 year old male, former smoker with hypertension who returns to pulmonary clinic for follow up of pneumonia and pleural effusion.   He is s/p chest tube and TPA/DNAse therapy for loculated right pleural effusion along with prolonged augmentin course with significnat improvement in his respiratory function. 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 No further pleural intervention recommended at this time.  Patient can follow up as needed.  Freda Jackson, MD [facility] Office: [phone]    Current  Outpatient Medications:    acetaminophen (TYLENOL) 500 MG tablet, Take 1,000 mg by mouth every 6 (six) hours as needed for headache (pain)., Disp: , Rfl:    acyclovir (ZOVIRAX) 400 MG tablet, TAKE 1 TABLET BY MOUTH EVERY DAY (Patient taking differently: 400 mg every morning.), Disp: 90 tablet, Rfl: 1   amLODipine (NORVASC) 5 MG tablet, TAKE ONE TABLET BY MOUTH ONCE DAILY TO CONTROL BLOOD PRESSURE (Patient taking differently: Take 5 mg by mouth every morning. to control blood pressure), Disp: 90 tablet, Rfl: 1   atorvastatin (LIPITOR) 20 MG tablet, TAKE 1 TABLET BY MOUTH EVERYDAY AT BEDTIME (Patient taking differently: Take 20 mg by mouth every morning.), Disp: 90 tablet, Rfl: 2   febuxostat (ULORIC) 40 MG tablet, Take 1 tablet (40 mg total) by mouth daily., Disp: 90 tablet, Rfl: 1   fluticasone (FLONASE) 50 MCG/ACT nasal spray, Place 1 spray into both nostrils daily. (Patient taking differently: Place 1 spray into both nostrils at bedtime.), Disp: 16 g, Rfl: 0   guaiFENesin (MUCINEX) 600 MG 12 hr tablet, Take 600 mg by mouth 2 (two) times daily., Disp: , Rfl:    HYDROcodone-acetaminophen (NORCO/VICODIN) 5-325 MG tablet, Take 1 tablet by mouth every 6 (six) hours as needed for moderate pain., Disp: , Rfl:    lidocaine (LIDODERM) 5 %, Place 1 patch onto the skin daily. Remove & Discard patch within 12 hours or as directed by MD, Disp: 30 patch, Rfl: 0   loratadine (CLARITIN) 10 MG tablet, Take 1 tablet (10 mg total) by mouth daily., Disp: 30 tablet, Rfl: 0   losartan (COZAAR) 100 MG tablet, TAKE ONE BY MOUTH DAILY FOR BLOOD PRESSURE CONTROL (Patient taking differently: Take 100 mg by mouth every morning. FOR BLOOD PRESSURE CONTROL), Disp: 90 tablet, Rfl: 1   Multiple Vitamins-Minerals (ANTIOXIDANT PO), Take 30 mLs by mouth daily. Aronia Berry Juice, Disp: , Rfl:    tadalafil (CIALIS) 20 MG tablet, Take 1 tablet (20 mg total) by mouth daily as needed for erectile dysfunction., Disp: 10 tablet, Rfl: 0    zolpidem (AMBIEN) 5 MG tablet, Take one tablet by mouth at bedtime as needed for sleep, Disp: 30 tablet, Rfl: 5

## 2022-02-06 ENCOUNTER — Other Ambulatory Visit: Payer: Self-pay | Admitting: Nurse Practitioner

## 2022-02-15 ENCOUNTER — Other Ambulatory Visit: Payer: Self-pay | Admitting: Nurse Practitioner

## 2022-02-15 ENCOUNTER — Other Ambulatory Visit: Payer: Self-pay

## 2022-02-15 DIAGNOSIS — G4700 Insomnia, unspecified: Secondary | ICD-10-CM

## 2022-02-15 NOTE — Telephone Encounter (Signed)
Patient called requesting a refill on Ambien and was informed he was given additional refill in Jan and will need to contact the pharmacy.

## 2022-03-08 ENCOUNTER — Telehealth: Payer: Self-pay

## 2022-03-08 DIAGNOSIS — N529 Male erectile dysfunction, unspecified: Secondary | ICD-10-CM

## 2022-03-08 MED ORDER — TADALAFIL 20 MG PO TABS: 20.0000 mg | ORAL_TABLET | Freq: Every day | ORAL | 3 refills | Status: DC | PRN

## 2022-03-08 NOTE — Telephone Encounter (Signed)
Patient called requested a refill for cialis # 4 to [facility] in [place]  ?

## 2022-03-21 ENCOUNTER — Other Ambulatory Visit: Payer: Self-pay | Admitting: Adult Health

## 2022-03-21 ENCOUNTER — Other Ambulatory Visit: Payer: Self-pay | Admitting: Nurse Practitioner

## 2022-03-21 DIAGNOSIS — I10 Essential (primary) hypertension: Secondary | ICD-10-CM

## 2022-03-21 DIAGNOSIS — M5441 Lumbago with sciatica, right side: Secondary | ICD-10-CM

## 2022-03-23 ENCOUNTER — Other Ambulatory Visit: Payer: Medicare HMO | Admitting: Internal Medicine

## 2022-03-26 ENCOUNTER — Ambulatory Visit: Payer: Medicare HMO | Admitting: Internal Medicine

## 2022-04-11 ENCOUNTER — Other Ambulatory Visit: Payer: Self-pay | Admitting: *Deleted

## 2022-04-11 DIAGNOSIS — M1A9XX0 Chronic gout, unspecified, without tophus (tophi): Secondary | ICD-10-CM

## 2022-04-11 MED ORDER — FEBUXOSTAT 40 MG PO TABS: 40.0000 mg | ORAL_TABLET | Freq: Every day | ORAL | 1 refills | Status: DC

## 2022-04-11 NOTE — Telephone Encounter (Signed)
Patient requested refill.  ?To be faxed to [facility] Fax: [phone] ?Printed and faxed. ?

## 2022-04-16 ENCOUNTER — Ambulatory Visit: Payer: Medicare HMO | Admitting: Nurse Practitioner

## 2022-04-23 ENCOUNTER — Ambulatory Visit: Payer: Medicare HMO | Admitting: Nurse Practitioner

## 2022-04-30 ENCOUNTER — Encounter: Payer: Self-pay | Admitting: Nurse Practitioner

## 2022-04-30 ENCOUNTER — Ambulatory Visit (INDEPENDENT_AMBULATORY_CARE_PROVIDER_SITE_OTHER): Payer: Medicare HMO | Admitting: Nurse Practitioner

## 2022-04-30 VITALS — BP 118/78 | HR 91 | Temp 97.7°F | Ht 70.0 in

## 2022-04-30 DIAGNOSIS — R350 Frequency of micturition: Secondary | ICD-10-CM

## 2022-04-30 DIAGNOSIS — M1A9XX0 Chronic gout, unspecified, without tophus (tophi): Secondary | ICD-10-CM

## 2022-04-30 DIAGNOSIS — E782 Mixed hyperlipidemia: Secondary | ICD-10-CM | POA: Diagnosis not present

## 2022-04-30 DIAGNOSIS — J302 Other seasonal allergic rhinitis: Secondary | ICD-10-CM | POA: Diagnosis not present

## 2022-04-30 DIAGNOSIS — I10 Essential (primary) hypertension: Secondary | ICD-10-CM | POA: Diagnosis not present

## 2022-04-30 DIAGNOSIS — N401 Enlarged prostate with lower urinary tract symptoms: Secondary | ICD-10-CM

## 2022-04-30 DIAGNOSIS — J410 Simple chronic bronchitis: Secondary | ICD-10-CM

## 2022-04-30 NOTE — Progress Notes (Signed)
? ? ?Careteam: ?Patient Care Team: ?[name], NP as PCP - General (Geriatric Medicine) ? ?PLACE OF SERVICE:  ?[facility]  ?Advanced Directive information ?Does Patient Have a Medical Advance Directive?: Yes, Type of Advance Directive: Living will, Does patient want to make changes to medical advance directive?: No - Patient declined ? ?No Known Allergies ? ?Chief Complaint  ?Patient presents with  ? Medical Management of Chronic Issues  ?  6 month follow up.Patient states that allergies are bothering him. Patient has congestion.  ? ? ? ?HPI: Patient is a 72 y.o. male for routine follow up.  ? ?Having a lot of congestion and allergies.  Not taking anything regularly  ? ?Knees doing well after surgery ? ?S/p chest tube in December due to loculated pleural effusion but doing well now.  ? ?Insomnia- continues on ambien nightly, has to take routinely  ? ?For exercises does bike and weights.  ? ?Review of Systems:  ?Review of Systems  ?Constitutional:  Negative for chills, fever and weight loss.  ?HENT:  Positive for congestion. Negative for tinnitus.   ?Respiratory:  Negative for cough, sputum production and shortness of breath.   ?Cardiovascular:  Negative for chest pain, palpitations and leg swelling.  ?Gastrointestinal:  Negative for abdominal pain, constipation, diarrhea and heartburn.  ?Genitourinary:  Negative for dysuria, frequency and urgency.  ?Musculoskeletal:  Negative for back pain, falls, joint pain and myalgias.  ?Skin: Negative.   ?Neurological:  Negative for dizziness and headaches.  ?Endo/Heme/Allergies:  Positive for environmental allergies.  ?Psychiatric/Behavioral:  Negative for depression and memory loss. The patient has insomnia.   ? ?Past Medical History:  ?Diagnosis Date  ? Backache, unspecified   ? Bronchitis   ? Cough variant asthma 05/03/2017  ? Genital herpes   ? Gout   ? Hearing loss   ? Hemorrhoids   ? Hernia   ? Herpes simplex   ? Hyperglycemia   ? Hyperlipidemia   ? Hypertension    ? Impotence   ? Insomnia, unspecified   ? Osteoarthrosis, unspecified whether generalized or localized, unspecified site   ? Tinea pedis, right   ? Unspecified disorder of male genital organs   ? ?Past Surgical History:  ?Procedure Laterality Date  ? fracture left femur  1984  ? ganglion repair of left forarm  2000  ? IVP  1992  ? ML old comp fracture  ? KNEE SURGERY Right 01/21/2019  ? Raliegh Ip   ? NASAL SEPTUM SURGERY  1997  ? TONSILLECTOMY  1959  ? TONSILLECTOMY    ? 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 Procedure: TOTAL KNEE ARTHROPLASTY;  Surgeon: Earlie Server, MD;  Location: [facility];  Service: Orthopedics;  Laterality: Right;  ? TOTAL KNEE ARTHROPLASTY Left 05/06/2020  ? Procedure: TOTAL KNEE ARTHROPLASTY;  Surgeon: Earlie Server, MD;  Location: [facility];  Service: Orthopedics;  Laterality: Left;  ? UMBILICAL HERNIA REPAIR  2002  ? ?Social History: ?  reports that he quit smoking about 38 years ago. His smoking use included cigarettes. He has a 15.00 pack-year smoking history. He quit smokeless tobacco use about 33 years ago. He reports current alcohol use of about 6.0 - 7.0 standard drinks per week. He reports that he does not use drugs. ? ?Family History  ?Problem Relation Age of Onset  ? COPD Mother   ? Aortic aneurysm Father   ? ? ?Medications: ?Patient's Medications  ?New Prescriptions  ? No medications on file  ?Previous Medications  ?  ACETAMINOPHEN (TYLENOL) 500 MG TABLET    Take 1,000 mg by mouth every 6 (six) hours as needed for headache (pain).  ? ACYCLOVIR (ZOVIRAX) 400 MG TABLET    TAKE 1 TABLET BY MOUTH EVERY DAY  ? AMLODIPINE (NORVASC) 5 MG TABLET    TAKE ONE TABLET BY MOUTH ONCE DAILY TO CONTROL BLOOD PRESSURE  ? ATORVASTATIN (LIPITOR) 20 MG TABLET    TAKE 1 TABLET BY MOUTH EVERYDAY AT BEDTIME  ? FEBUXOSTAT (ULORIC) 40 MG TABLET    Take 1 tablet (40 mg total) by mouth daily.  ? FLUTICASONE (FLONASE) 50 MCG/ACT NASAL SPRAY    Place 1 spray into both nostrils daily.  ? GUAIFENESIN  (MUCINEX) 600 MG 12 HR TABLET    Take 600 mg by mouth 2 (two) times daily.  ? LOSARTAN (COZAAR) 100 MG TABLET    TAKE ONE BY MOUTH DAILY FOR BLOOD PRESSURE CONTROL  ? MULTIPLE VITAMINS-MINERALS (ANTIOXIDANT PO)    Take 30 mLs by mouth daily. Aronia Berry Juice  ? TADALAFIL (CIALIS) 20 MG TABLET    Take 1 tablet (20 mg total) by mouth daily as needed for erectile dysfunction.  ? ZOLPIDEM (AMBIEN) 5 MG TABLET    Take one tablet by mouth at bedtime as needed for sleep  ?Modified Medications  ? No medications on file  ?Discontinued Medications  ? HYDROCODONE-ACETAMINOPHEN (NORCO/VICODIN) 5-325 MG TABLET    Take 1 tablet by mouth every 6 (six) hours as needed for moderate pain.  ? LIDOCAINE (LIDODERM) 5 %    Place 1 patch onto the skin daily. Remove & Discard patch within 12 hours or as directed by MD  ? LORATADINE (CLARITIN) 10 MG TABLET    Take 1 tablet (10 mg total) by mouth daily.  ? ? ?Physical Exam: ? ?Vitals:  ? 04/30/22 0947  ?BP: 118/78  ?Pulse: 91  ?Temp: 97.7 ?F (36.5 ?C)  ?SpO2: 95%  ?Height: '5\' 10"'  (1.778 m)  ? ?Body mass index is 29.27 kg/m?. ?Wt Readings from Last 3 Encounters:  ?01/30/22 204 lb (92.5 kg)  ?12/29/21 199 lb (90.3 kg)  ?12/26/21 194 lb 0.1 oz (88 kg)  ? ? ?Physical Exam ?Constitutional:   ?   General: He is not in acute distress. ?   Appearance: He is well-developed. He is not diaphoretic.  ?HENT:  ?   Head: Normocephalic and atraumatic.  ?   Right Ear: External ear normal.  ?   Left Ear: External ear normal.  ?   Mouth/Throat:  ?   Pharynx: No oropharyngeal exudate.  ?Eyes:  ?   Conjunctiva/sclera: Conjunctivae normal.  ?   Pupils: Pupils are equal, round, and reactive to light.  ?Cardiovascular:  ?   Rate and Rhythm: Normal rate and regular rhythm.  ?   Heart sounds: Normal heart sounds.  ?Pulmonary:  ?   Effort: Pulmonary effort is normal.  ?   Breath sounds: Normal breath sounds.  ?Abdominal:  ?   General: Bowel sounds are normal.  ?   Palpations: Abdomen is soft.  ?Musculoskeletal:      ?   General: No tenderness.  ?   Cervical back: Normal range of motion and neck supple.  ?   Right lower leg: No edema.  ?   Left lower leg: No edema.  ?Skin: ?   General: Skin is warm and dry.  ?Neurological:  ?   Mental Status: He is alert and oriented to person, place, and time.  ? ? ?Labs reviewed: ?  Basic Metabolic Panel: ?Recent Labs  ?  12/22/21 ?5366 12/23/21 ?0207 12/24/21 ?4403  ?NA 133* 135 133*  ?K 4.2 4.3 3.7  ?CL 97* 99 99  ?CO2 '26 25 24  ' ?GLUCOSE 105* 107* 203*  ?BUN '9 9 10  ' ?CREATININE 0.78 0.94 0.80  ?CALCIUM 9.1 9.3 9.1  ? ?Liver Function Tests: ?Recent Labs  ?  12/12/21 ?0203 12/22/21 ?1644 12/23/21 ?0207  ?AST '27 19 19  ' ?ALT '25 28 25  ' ?ALKPHOS 119 124 116  ?BILITOT 0.6 0.5 0.4  ?PROT 6.1* 7.1 7.1  ?ALBUMIN 2.1* 2.9* 2.8*  ? ?No results for input(s): LIPASE, AMYLASE in the last 8760 hours. ?No results for input(s): AMMONIA in the last 8760 hours. ?CBC: ?Recent Labs  ?  12/09/21 ?0121 12/10/21 ?4742 12/23/21 ?0207 12/25/21 ?0304 12/26/21 ?5956  ?WBC 16.5*   < > 9.7 12.5* 9.9  ?NEUTROABS 11.9*  --   --  6.5 4.8  ?HGB 11.7*   < > 13.2 12.3* 11.9*  ?HCT 36.2*   < > 39.3 37.7* 36.6*  ?MCV 97.8   < > 95.2 95.7 95.3  ?PLT 304   < > 752* 644* 612*  ? < > = values in this interval not displayed.  ? ?Lipid Panel: ?Recent Labs  ?  10/09/21 ?1340  ?CHOL 194  ?HDL 72  ?LDLCALC 102*  ?TRIG 103  ?CHOLHDL 2.7  ? ?TSH: ?No results for input(s): TSH in the last 8760 hours. ?A1C: ?Lab Results  ?Component Value Date  ? HGBA1C 5.5 01/20/2019  ? ? ? ?Assessment/Plan ?1. Mixed hyperlipidemia ?-continues on lipitor with dietary modifications.  ? ?2. Chronic gout without tophus, unspecified cause, unspecified site ?No recent flares, continues on uloric ?- CMP with eGFR([facility]) ?- Uric Acid ? ?3. Simple chronic bronchitis ([place]) ?Stable at this time.  ? ?4. Benign prostatic hyperplasia with urinary frequency ?Stable, no changes in frequency or flow.  ? ?5. Essential hypertension ?Blood pressure well controlled ?Continue  current medications ?Recheck metabolic panel ?- CMP with eGFR([facility]) ?- CBC with Differential/Platelet ? ?6. Seasonal allergies ?-Netty pot every night, can use twice daily if you want to to help with nasal con

## 2022-04-30 NOTE — Patient Instructions (Signed)
Netty pot every night, can use twice daily if you want to to help with nasal congestion ? ?Flonase 1 spray into both nare twice daily  ?Zyrtec 10 mg by mouth daily (over the counter)  ?

## 2022-05-01 LAB — COMPLETE METABOLIC PANEL WITH GFR
AG Ratio: 2 (calc) (ref 1.0–2.5)
ALT: 19 U/L (ref 9–46)
AST: 16 U/L (ref 10–35)
Albumin: 4.2 g/dL (ref 3.6–5.1)
Alkaline phosphatase (APISO): 96 U/L (ref 35–144)
BUN: 12 mg/dL (ref 7–25)
CO2: 29 mmol/L (ref 20–32)
Calcium: 9.6 mg/dL (ref 8.6–10.3)
Chloride: 102 mmol/L (ref 98–110)
Creat: 0.87 mg/dL (ref 0.70–1.28)
Globulin: 2.1 g/dL (calc) (ref 1.9–3.7)
Glucose, Bld: 123 mg/dL (ref 65–139)
Potassium: 4.2 mmol/L (ref 3.5–5.3)
Sodium: 139 mmol/L (ref 135–146)
Total Bilirubin: 0.6 mg/dL (ref 0.2–1.2)
Total Protein: 6.3 g/dL (ref 6.1–8.1)
eGFR: 91 mL/min/{1.73_m2} (ref >=60)

## 2022-05-01 LAB — CBC WITH DIFFERENTIAL/PLATELET
Absolute Monocytes: 1251 cells/uL — ABNORMAL HIGH (ref 200–950)
Basophils Absolute: 74 cells/uL (ref 0–200)
Basophils Relative: 0.7 %
Eosinophils Absolute: 85 cells/uL (ref 15–500)
Eosinophils Relative: 0.8 %
HCT: 42.7 % (ref 38.5–50.0)
Hemoglobin: 15.2 g/dL (ref 13.2–17.1)
Lymphs Abs: 3085 cells/uL (ref 850–3900)
MCH: 33.3 pg — ABNORMAL HIGH (ref 27.0–33.0)
MCHC: 35.6 g/dL (ref 32.0–36.0)
MCV: 93.4 fL (ref 80.0–100.0)
MPV: 10.1 fL (ref 7.5–12.5)
Monocytes Relative: 11.8 %
Neutro Abs: 6106 cells/uL (ref 1500–7800)
Neutrophils Relative %: 57.6 %
Platelets: 293 10*3/uL (ref 140–400)
RBC: 4.57 10*6/uL (ref 4.20–5.80)
RDW: 12 % (ref 11.0–15.0)
Total Lymphocyte: 29.1 %
WBC: 10.6 10*3/uL (ref 3.8–10.8)

## 2022-05-01 LAB — URIC ACID: Uric Acid, Serum: 5.3 mg/dL (ref 4.0–8.0)

## 2022-06-22 ENCOUNTER — Other Ambulatory Visit: Payer: Self-pay | Admitting: Nurse Practitioner

## 2022-06-22 DIAGNOSIS — I10 Essential (primary) hypertension: Secondary | ICD-10-CM

## 2022-07-16 ENCOUNTER — Other Ambulatory Visit: Payer: Self-pay | Admitting: *Deleted

## 2022-07-16 DIAGNOSIS — M1A9XX0 Chronic gout, unspecified, without tophus (tophi): Secondary | ICD-10-CM

## 2022-07-16 MED ORDER — FEBUXOSTAT 40 MG PO TABS: 40.0000 mg | ORAL_TABLET | Freq: Every day | ORAL | 3 refills | Status: DC

## 2022-07-16 NOTE — Telephone Encounter (Signed)
[place] ([facility])requested refill. Printed and faxed to [phone]

## 2022-07-22 ENCOUNTER — Other Ambulatory Visit: Payer: Self-pay | Admitting: Nurse Practitioner

## 2022-07-22 DIAGNOSIS — E782 Mixed hyperlipidemia: Secondary | ICD-10-CM

## 2022-07-23 DIAGNOSIS — H524 Presbyopia: Secondary | ICD-10-CM | POA: Diagnosis not present

## 2022-07-23 DIAGNOSIS — H2513 Age-related nuclear cataract, bilateral: Secondary | ICD-10-CM | POA: Diagnosis not present

## 2022-07-23 DIAGNOSIS — H5203 Hypermetropia, bilateral: Secondary | ICD-10-CM | POA: Diagnosis not present

## 2022-07-23 DIAGNOSIS — H52223 Regular astigmatism, bilateral: Secondary | ICD-10-CM | POA: Diagnosis not present

## 2022-07-23 DIAGNOSIS — Z135 Encounter for screening for eye and ear disorders: Secondary | ICD-10-CM | POA: Diagnosis not present

## 2022-08-09 ENCOUNTER — Other Ambulatory Visit: Payer: Self-pay | Admitting: Nurse Practitioner

## 2022-08-30 ENCOUNTER — Ambulatory Visit: Payer: Medicare HMO | Admitting: Podiatry

## 2022-08-30 ENCOUNTER — Encounter: Payer: Self-pay | Admitting: Podiatry

## 2022-08-30 DIAGNOSIS — M722 Plantar fascial fibromatosis: Secondary | ICD-10-CM | POA: Diagnosis not present

## 2022-08-30 DIAGNOSIS — B351 Tinea unguium: Secondary | ICD-10-CM

## 2022-08-30 MED ORDER — TERBINAFINE HCL 250 MG PO TABS: ORAL_TABLET | ORAL | 0 refills | Status: DC

## 2022-08-30 NOTE — Progress Notes (Signed)
Subjective:   Patient ID: Rodney Foster, male   DOB: 72 y.o.   MRN: [id number]   HPI Patient presents stating he has had a fungus of his right big toenail and has tried over-the-counter medicine and antifungal's topical in the past.  Patient does not smoke likes to be active   Review of Systems  All other systems reviewed and are negative.       Objective:  Physical Exam Vitals and nursing note reviewed.  Constitutional:      Appearance: He is well-developed.  Pulmonary:     Effort: Pulmonary effort is normal.  Musculoskeletal:        General: Normal range of motion.  Skin:    General: Skin is warm.  Neurological:     Mental Status: He is alert.     Neurovascular status found to be intact muscle strength found to be adequate range of motion within normal limits with patient found to have thickness and deformity of the right hallux nail distal two thirds of several months duration.  He has not had it before that does not remember trauma     Assessment:  Possibility that this is a fungal infection versus traumatic or combination of the 2     Plan:  H&P reviewed condition and went ahead today and recommended a pulse antifungal therapy along with laser therapy of the right hallux nail with instructions given today.  Patient will be seen back to recheck

## 2022-08-31 ENCOUNTER — Ambulatory Visit (INDEPENDENT_AMBULATORY_CARE_PROVIDER_SITE_OTHER): Payer: Medicare HMO

## 2022-08-31 DIAGNOSIS — B351 Tinea unguium: Secondary | ICD-10-CM

## 2022-08-31 NOTE — Progress Notes (Signed)
Patient presents today for the 1st laser treatment. Diagnosed with mycotic nail infection by Dr. Paulla Dolly.   Toenail most affected right 1st.  All other systems are negative.  Nails were filed thin. Laser therapy was administered to 1st toenails right and patient tolerated the treatment well. All safety precautions were in place.   Single laser pass was done on non-affected nails.   Follow up in 6 weeks for laser # 2.  Patient taking pulse dose Rx of Terbinafine as ordered. Patient started Rx today.

## 2022-08-31 NOTE — Patient Instructions (Signed)

## 2022-09-22 ENCOUNTER — Other Ambulatory Visit: Payer: Self-pay | Admitting: Nurse Practitioner

## 2022-09-22 DIAGNOSIS — G4700 Insomnia, unspecified: Secondary | ICD-10-CM

## 2022-09-24 NOTE — Telephone Encounter (Signed)
  Patient is requesting a refill of the following medications: Requested Prescriptions   Pending Prescriptions Disp Refills   amLODipine (NORVASC) 5 MG tablet [Pharmacy Med Name: AMLODIPINE BESYLATE 5 MG TAB] 90 tablet 2    Sig: TAKE ONE TABLET BY MOUTH ONCE DAILY TO CONTROL BLOOD PRESSURE   zolpidem (AMBIEN) 5 MG tablet [Pharmacy Med Name: ZOLPIDEM TARTRATE 5 MG TABLET] 30 tablet     Sig: TAKE 1 TABLET BY MOUTH AT BEDTIME AS NEEDED FOR SLEEP      Date of last refill: 01/02/22 Last refill amount: 30/5 Treatment agreement date: 04/30/2022

## 2022-10-19 ENCOUNTER — Ambulatory Visit (INDEPENDENT_AMBULATORY_CARE_PROVIDER_SITE_OTHER): Payer: Medicare HMO

## 2022-10-19 DIAGNOSIS — B351 Tinea unguium: Secondary | ICD-10-CM

## 2022-10-19 NOTE — Progress Notes (Signed)
Patient presents today for the 2nd laser treatment. Diagnosed with mycotic nail infection by Dr. Paulla Dolly.   Toenail most affected right 1st.  All other systems are negative.  Nails were filed thin. Laser therapy was administered to 1st toenails right and patient tolerated the treatment well. All safety precautions were in place.   Single laser pass was done on non-affected nails.   Follow up in 6 weeks for laser # 3.  Patient taking pulse dose Rx of Terbinafine as ordered. Patient started Rx today.

## 2022-10-23 DIAGNOSIS — H35372 Puckering of macula, left eye: Secondary | ICD-10-CM | POA: Diagnosis not present

## 2022-10-23 DIAGNOSIS — H25043 Posterior subcapsular polar age-related cataract, bilateral: Secondary | ICD-10-CM | POA: Diagnosis not present

## 2022-10-23 DIAGNOSIS — H18413 Arcus senilis, bilateral: Secondary | ICD-10-CM | POA: Diagnosis not present

## 2022-10-23 DIAGNOSIS — H2513 Age-related nuclear cataract, bilateral: Secondary | ICD-10-CM | POA: Diagnosis not present

## 2022-10-23 DIAGNOSIS — H25013 Cortical age-related cataract, bilateral: Secondary | ICD-10-CM | POA: Diagnosis not present

## 2022-10-23 DIAGNOSIS — H2512 Age-related nuclear cataract, left eye: Secondary | ICD-10-CM | POA: Diagnosis not present

## 2022-12-06 ENCOUNTER — Ambulatory Visit (INDEPENDENT_AMBULATORY_CARE_PROVIDER_SITE_OTHER): Payer: Medicare HMO

## 2022-12-06 ENCOUNTER — Ambulatory Visit (INDEPENDENT_AMBULATORY_CARE_PROVIDER_SITE_OTHER): Payer: Medicare HMO | Admitting: Nurse Practitioner

## 2022-12-06 ENCOUNTER — Encounter: Payer: Self-pay | Admitting: Nurse Practitioner

## 2022-12-06 DIAGNOSIS — B351 Tinea unguium: Secondary | ICD-10-CM

## 2022-12-06 DIAGNOSIS — Z0000 Encounter for general adult medical examination without abnormal findings: Secondary | ICD-10-CM

## 2022-12-06 DIAGNOSIS — Z1211 Encounter for screening for malignant neoplasm of colon: Secondary | ICD-10-CM

## 2022-12-06 DIAGNOSIS — Z1212 Encounter for screening for malignant neoplasm of rectum: Secondary | ICD-10-CM

## 2022-12-06 NOTE — Progress Notes (Signed)
Patient presents today for the 3rd laser treatment. Diagnosed with mycotic nail infection by Dr. Paulla Dolly.   Toenail most affected right 1st.  The right 1st toenail has improve in appearance.   All other systems are negative.  Nails were filed thin. Laser therapy was administered to 1st toenails right and patient tolerated the treatment well. All safety precautions were in place.     Follow up in 6 weeks for laser # 4.  Patient taking pulse dose Rx of Terbinafine as ordered.

## 2022-12-06 NOTE — Progress Notes (Signed)
This service is provided via telemedicine  No vital signs collected/recorded due to the encounter was a telemedicine visit.   Location of patient (ex: home, work):  Home  Patient consents to a telephone visit:    Location of the provider (ex: office, home):  [facility]  Name of any referring provider:  [name], NP   Names of all persons participating in the telemedicine service and their role in the encounter:  Rodney Foster (patient); Mozel Burdett,CMA; Dani Gobble  Time spent on call:  8 minutes

## 2022-12-06 NOTE — Patient Instructions (Signed)
[name] , Thank you for taking time to come for your Medicare Wellness Visit. I appreciate your ongoing commitment to your health goals. Please review the following plan we discussed and let me know if I can assist you in the future.   Screening recommendations/referrals: Colonoscopy due- willing to do cologuard- order placed- to complete when you get in the mail Recommended yearly ophthalmology/optometry visit for glaucoma screening and checkup Recommended yearly dental visit for hygiene and checkup  Vaccinations: Influenza vaccine due annually in September/October Pneumococcal vaccine up to date Tdap vaccine up to date Shingles vaccine up to date    Advanced directives: on file.   Conditions/risks identified: advanced age, htn, high cholesterol   Next appointment: yearly  Preventive Care 74 Years and Older, Male Preventive care refers to lifestyle choices and visits with your health care provider that can promote health and wellness. What does preventive care include? A yearly physical exam. This is also called an annual well check. Dental exams once or twice a year. Routine eye exams. Ask your health care provider how often you should have your eyes checked. Personal lifestyle choices, including: Daily care of your teeth and gums. Regular physical activity. Eating a healthy diet. Avoiding tobacco and drug use. Limiting alcohol use. Practicing safe sex. Taking low doses of aspirin every day. Taking vitamin and mineral supplements as recommended by your health care provider. What happens during an annual well check? The services and screenings done by your health care provider during your annual well check will depend on your age, overall health, lifestyle risk factors, and family history of disease. Counseling  Your health care provider may ask you questions about your: Alcohol use. Tobacco use. Drug use. Emotional well-being. Home and relationship well-being. Sexual  activity. Eating habits. History of falls. Memory and ability to understand (cognition). Work and work Statistician. Screening  You may have the following tests or measurements: Height, weight, and BMI. Blood pressure. Lipid and cholesterol levels. These may be checked every 5 years, or more frequently if you are over 54 years old. Skin check. Lung cancer screening. You may have this screening every year starting at age 51 if you have a 30-pack-year history of smoking and currently smoke or have quit within the past 15 years. Fecal occult blood test (FOBT) of the stool. You may have this test every year starting at age 107. Flexible sigmoidoscopy or colonoscopy. You may have a sigmoidoscopy every 5 years or a colonoscopy every 10 years starting at age 58. Prostate cancer screening. Recommendations will vary depending on your family history and other risks. Hepatitis C blood test. Hepatitis B blood test. Sexually transmitted disease (STD) testing. Diabetes screening. This is done by checking your blood sugar (glucose) after you have not eaten for a while (fasting). You may have this done every 1-3 years. Abdominal aortic aneurysm (AAA) screening. You may need this if you are a current or former smoker. Osteoporosis. You may be screened starting at age 62 if you are at high risk. Talk with your health care provider about your test results, treatment options, and if necessary, the need for more tests. Vaccines  Your health care provider may recommend certain vaccines, such as: Influenza vaccine. This is recommended every year. Tetanus, diphtheria, and acellular pertussis (Tdap, Td) vaccine. You may need a Td booster every 10 years. Zoster vaccine. You may need this after age 49. Pneumococcal 13-valent conjugate (PCV13) vaccine. One dose is recommended after age 33. Pneumococcal polysaccharide (PPSV23) vaccine. One  dose is recommended after age 15. Talk to your health care provider about which  screenings and vaccines you need and how often you need them. This information is not intended to replace advice given to you by your health care provider. Make sure you discuss any questions you have with your health care provider. Document Released: 01/13/2016 Document Revised: 09/05/2016 Document Reviewed: 10/18/2015 Elsevier Interactive Patient Education  2017 [place] Prevention in the Home Falls can cause injuries. They can happen to people of all ages. There are many things you can do to make your home safe and to help prevent falls. What can I do on the outside of my home? Regularly fix the edges of walkways and driveways and fix any cracks. Remove anything that might make you trip as you walk through a door, such as a raised step or threshold. Trim any bushes or trees on the path to your home. Use bright outdoor lighting. Clear any walking paths of anything that might make someone trip, such as rocks or tools. Regularly check to see if handrails are loose or broken. Make sure that both sides of any steps have handrails. Any raised decks and porches should have guardrails on the edges. Have any leaves, snow, or ice cleared regularly. Use sand or salt on walking paths during winter. Clean up any spills in your garage right away. This includes oil or grease spills. What can I do in the bathroom? Use night lights. Install grab bars by the toilet and in the tub and shower. Do not use towel bars as grab bars. Use non-skid mats or decals in the tub or shower. If you need to sit down in the shower, use a plastic, non-slip stool. Keep the floor dry. Clean up any water that spills on the floor as soon as it happens. Remove soap buildup in the tub or shower regularly. Attach bath mats securely with double-sided non-slip rug tape. Do not have throw rugs and other things on the floor that can make you trip. What can I do in the bedroom? Use night lights. Make sure that you have a  light by your bed that is easy to reach. Do not use any sheets or blankets that are too big for your bed. They should not hang down onto the floor. Have a firm chair that has side arms. You can use this for support while you get dressed. Do not have throw rugs and other things on the floor that can make you trip. What can I do in the kitchen? Clean up any spills right away. Avoid walking on wet floors. Keep items that you use a lot in easy-to-reach places. If you need to reach something above you, use a strong step stool that has a grab bar. Keep electrical cords out of the way. Do not use floor polish or wax that makes floors slippery. If you must use wax, use non-skid floor wax. Do not have throw rugs and other things on the floor that can make you trip. What can I do with my stairs? Do not leave any items on the stairs. Make sure that there are handrails on both sides of the stairs and use them. Fix handrails that are broken or loose. Make sure that handrails are as long as the stairways. Check any carpeting to make sure that it is firmly attached to the stairs. Fix any carpet that is loose or worn. Avoid having throw rugs at the top or bottom of the  stairs. If you do have throw rugs, attach them to the floor with carpet tape. Make sure that you have a light switch at the top of the stairs and the bottom of the stairs. If you do not have them, ask someone to add them for you. What else can I do to help prevent falls? Wear shoes that: Do not have high heels. Have rubber bottoms. Are comfortable and fit you well. Are closed at the toe. Do not wear sandals. If you use a stepladder: Make sure that it is fully opened. Do not climb a closed stepladder. Make sure that both sides of the stepladder are locked into place. Ask someone to hold it for you, if possible. Clearly mark and make sure that you can see: Any grab bars or handrails. First and last steps. Where the edge of each step  is. Use tools that help you move around (mobility aids) if they are needed. These include: Canes. Walkers. Scooters. Crutches. Turn on the lights when you go into a dark area. Replace any light bulbs as soon as they burn out. Set up your furniture so you have a clear path. Avoid moving your furniture around. If any of your floors are uneven, fix them. If there are any pets around you, be aware of where they are. Review your medicines with your doctor. Some medicines can make you feel dizzy. This can increase your chance of falling. Ask your doctor what other things that you can do to help prevent falls. This information is not intended to replace advice given to you by your health care provider. Make sure you discuss any questions you have with your health care provider. Document Released: 10/13/2009 Document Revised: 05/24/2016 Document Reviewed: 01/21/2015 Elsevier Interactive Patient Education  2017 [employer].

## 2022-12-06 NOTE — Progress Notes (Signed)
Subjective:   Rodney Foster is a 72 y.o. male who presents for Medicare Annual/Subsequent preventive examination.  Review of Systems     Cardiac Risk Factors include: advanced age (>29mn, >>1women)     Objective:    Today's Vitals   12/06/22 0940  PainSc: 6    There is no height or weight on file to calculate BMI.     12/06/2022    9:45 AM 04/30/2022    9:47 AM 12/22/2021    4:00 PM 12/20/2021    8:12 AM 12/07/2021    2:00 PM 12/05/2021    7:15 PM 11/21/2021    4:11 PM  Advanced Directives  Does Patient Have a Medical Advance Directive? Yes Yes No Yes No No Yes  Type [place] will Living will Living will   Living will  Does patient want to make changes to medical advance directive? No - Patient declined No - Patient declined No - Patient declined No - Patient declined     Would patient like information on creating a medical advance directive?   No - Patient declined  No - Patient declined      Current Medications (verified) Outpatient Encounter Medications as of 12/06/2022  Medication Sig   acetaminophen (TYLENOL) 500 MG tablet Take 1,000 mg by mouth every 6 (six) hours as needed for headache (pain).   acyclovir (ZOVIRAX) 400 MG tablet TAKE 1 TABLET BY MOUTH EVERY DAY   amLODipine (NORVASC) 5 MG tablet TAKE ONE TABLET BY MOUTH ONCE DAILY TO CONTROL BLOOD PRESSURE   atorvastatin (LIPITOR) 20 MG tablet TAKE 1 TABLET BY MOUTH EVERYDAY AT BEDTIME   febuxostat (ULORIC) 40 MG tablet Take 1 tablet (40 mg total) by mouth daily.   fluticasone (FLONASE) 50 MCG/ACT nasal spray Place 1 spray into both nostrils daily. (Patient taking differently: Place 1 spray into both nostrils at bedtime.)   guaiFENesin (MUCINEX) 600 MG 12 hr tablet Take 600 mg by mouth 2 (two) times daily.   losartan (COZAAR) 100 MG tablet TAKE ONE BY MOUTH DAILY FOR BLOOD PRESSURE CONTROL   Multiple Vitamins-Minerals (ANTIOXIDANT PO) Take 30 mLs by mouth  daily. Aronia Berry Juice   tadalafil (CIALIS) 20 MG tablet Take 1 tablet (20 mg total) by mouth daily as needed for erectile dysfunction.   terbinafine (LAMISIL) 250 MG tablet Please take one a day x 7days, repeat every 4 weeks x 4 months   zolpidem (AMBIEN) 5 MG tablet TAKE 1 TABLET BY MOUTH AT BEDTIME AS NEEDED FOR SLEEP   No facility-administered encounter medications on file as of 12/06/2022.    Allergies (verified) Patient has no known allergies.   History: Past Medical History:  Diagnosis Date   Backache, unspecified    Bronchitis    Cough variant asthma 05/03/2017   Genital herpes    Gout    Hearing loss    Hemorrhoids    Hernia    Herpes simplex    Hyperglycemia    Hyperlipidemia    Hypertension    Impotence    Insomnia, unspecified    Osteoarthrosis, unspecified whether generalized or localized, unspecified site    Tinea pedis, right    Unspecified disorder of male genital organs    Past Surgical History:  Procedure Laterality Date   fracture left femur  1984   ganglion repair of left forarm  2000   IVP  89  ML old comp fracture   KNEE SURGERY Right  01/21/2019   [place]   TONSILLECTOMY     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   Procedure: TOTAL KNEE ARTHROPLASTY;  Surgeon: Earlie Server, MD;  Location: [facility];  Service: Orthopedics;  Laterality: Right;   TOTAL KNEE ARTHROPLASTY Left 05/06/2020   Procedure: TOTAL KNEE ARTHROPLASTY;  Surgeon: Earlie Server, MD;  Location: [facility];  Service: Orthopedics;  Laterality: Left;   UMBILICAL HERNIA REPAIR  2002   Family History  Problem Relation Age of Onset   COPD Mother    Aortic aneurysm Father    Social History   Socioeconomic History   Marital status: Divorced    Spouse name: Not on file   Number of children: Not on file   Years of education: Not on file   Highest education level: Not on file  Occupational History   Not on file  Tobacco  Use   Smoking status: Former    Packs/day: 1.00    Years: 15.00    Total pack years: 15.00    Types: Cigarettes    Quit date: 01/01/1984    Years since quitting: 38.9   Smokeless tobacco: Former    Quit date: 1990  Scientific laboratory technician Use: Never used  Substance and Sexual Activity   Alcohol use: Yes    Alcohol/week: 6.0 - 7.0 standard drinks of alcohol    Types: 6 - 7 Glasses of wine per week    Comment: 3-4 a night of wine or cocktail   Drug use: No   Sexual activity: Yes  Other Topics Concern   Not on file  Social History Narrative   Not on file   Social Determinants of Health   Financial Resource Strain: Low Risk  (09/05/2018)   Overall Financial Resource Strain (CARDIA)    Difficulty of Paying Living Expenses: Not hard at all  Food Insecurity: No Food Insecurity (09/05/2018)   Hunger Vital Sign    Worried About Running Out of Food in the Last Year: Never true    Ran Out of Food in the Last Year: Never true  Transportation Needs: No Transportation Needs (09/05/2018)   PRAPARE - Hydrologist (Medical): No    Lack of Transportation (Non-Medical): No  Physical Activity: Insufficiently Active (09/05/2018)   Exercise Vital Sign    Days of Exercise per Week: 4 days    Minutes of Exercise per Session: 30 min  Stress: No Stress Concern Present (09/05/2018)   [place]    Feeling of Stress : Only a little  Social Connections: Moderately Isolated (09/05/2018)   Social Connection and Isolation Panel [NHANES]    Frequency of Communication with Friends and Family: More than three times a week    Frequency of Social Gatherings with Friends and Family: More than three times a week    Attends Religious Services: Never    Marine scientist or Organizations: No    Attends Music therapist: Never    Marital Status: Divorced    Tobacco Counseling Counseling given: Not  Answered   Clinical Intake:  Pre-visit preparation completed: Yes  Pain : 0-10 (X-ray next week and doctors appointment) Pain Score: 6  Pain Type: Chronic pain Pain Location: Shoulder Pain Orientation: Left, Right Pain Descriptors / Indicators: Aching Pain Onset: 1 to 4 weeks ago Pain Frequency: Intermittent     BMI -  recorded: 29.27 Nutritional Status: BMI 25 -29 Overweight Nutritional Risks: None Diabetes: No  How often do you need to have someone help you when you read instructions, pamphlets, or other written materials from your doctor or pharmacy?: 1 - Never What is the last grade level you completed in school?: 12th  Diabetic?no  Interpreter Needed?: No  Information entered by :: Porsha Mclurkin,CMA   Activities of Daily Living    12/06/2022    9:46 AM 12/22/2021    4:00 PM  In your present state of health, do you have any difficulty performing the following activities:  Hearing? 0 1  Vision? 0 0  Difficulty concentrating or making decisions? 0 0  Walking or climbing stairs? 0 0  Dressing or bathing? 0 0  Doing errands, shopping? 0 0  Preparing Food and eating ? N   Using the Toilet? N   In the past six months, have you accidently leaked urine? N   Do you have problems with loss of bowel control? N   Managing your Medications? N   Managing your Finances? N   Housekeeping or managing your Housekeeping? N     Patient Care Team: [name], NP as PCP - General (Geriatric Medicine)  Indicate any recent Medical Services you may have received from other than [facility] providers in the past year (date may be approximate).     Assessment:   This is a routine wellness examination for [place].  Hearing/Vision screen Hearing Screening - Comments:: No hearing concerns Vision Screening - Comments:: Wear glasses but no concerns  Dietary issues and exercise activities discussed: Current Exercise Habits: Home exercise routine, Type of exercise:  walking;treadmill;strength training/weights, Time (Minutes): 60, Frequency (Times/Week): 7, Weekly Exercise (Minutes/Week): 420, Intensity: Mild   Goals Addressed   None    Depression Screen    12/06/2022    9:45 AM 11/21/2021    4:09 PM 04/17/2021   10:11 AM 10/28/2020    1:31 PM 09/15/2020    9:29 AM 09/15/2019    9:46 AM 09/15/2019    9:44 AM  PHQ 2/9 Scores  PHQ - 2 Score 0 0 0 0 0 0 0    Fall Risk    12/06/2022    9:45 AM 04/30/2022    9:47 AM 12/20/2021   10:00 AM 11/21/2021    4:08 PM 11/13/2021    3:19 PM  [place] in the past year? 0 0 0 1 0  Number falls in past yr: 0 0 0 1 0  Injury with Fall? 0 0 0 1 0  Risk for fall due to : No Fall Risks No Fall Risks No Fall Risks History of fall(s) No Fall Risks  Follow up Falls evaluation completed Falls evaluation completed Falls evaluation completed Falls evaluation completed Falls evaluation completed    [place]:  Any stairs in or around the home? Yes  If so, are there any without handrails? No  Home free of loose throw rugs in walkways, pet beds, electrical cords, etc? Yes  Adequate lighting in your home to reduce risk of falls? Yes   ASSISTIVE DEVICES UTILIZED TO PREVENT FALLS:  Life alert? No  Use of a cane, walker or w/c? No  Grab bars in the bathroom? No  Shower chair or bench in shower? No  Elevated toilet seat or a handicapped toilet? No   TIMED UP AND GO:  Was the test performed? No .   Cognitive  Function:    09/05/2018    8:31 AM 08/26/2017    3:42 PM 10/10/2016    2:27 PM  MMSE - Mini Mental State Exam  Orientation to time '5 5 5  '$ Orientation to Place '5 5 5  '$ Registration '3 3 3  '$ Attention/ Calculation '5 5 5  '$ Recall '2 2 3  '$ Language- name 2 objects '2 2 2  '$ Language- repeat '1 1 1  '$ Language- follow 3 step command '3 2 3  '$ Language- read & follow direction '1 1 1  '$ Write a sentence '1 1 1  '$ Copy design '1 1 1  '$ Total score '29 28 30        '$ 12/06/2022    9:47 AM  11/21/2021    4:11 PM 09/15/2020    9:31 AM 09/15/2019    9:46 AM  6CIT Screen  What Year? 0 points 0 points 0 points 0 points  What month? 0 points 0 points 0 points 0 points  What time? 0 points 0 points 0 points 0 points  Count back from 20 0 points 0 points 0 points 0 points  Months in reverse 0 points 0 points 0 points 0 points  Repeat phrase 0 points 0 points 0 points 0 points  Total Score 0 points 0 points 0 points 0 points    Immunizations Immunization History  Administered Date(s) Administered   COVID-19, mRNA, vaccine(Comirnaty)12 years and older 10/19/2022   Fluad Quad(high Dose 65+) 08/20/2019, 10/28/2020, 10/09/2021, 10/19/2022   Influenza, High Dose Seasonal PF 10/17/2017, 09/05/2018   Influenza,inj,Quad PF,6+ Mos 12/02/2013, 03/07/2016, 10/10/2016   Influenza-Unspecified 12/05/2011   PFIZER(Purple Top)SARS-COV-2 Vaccination 01/22/2020, 02/12/2020, 09/14/2020, 04/18/2021   Pfizer Covid-19 Vaccine Bivalent Booster 53yr & up 09/19/2021, 05/10/2022   Pneumococcal Conjugate-13 08/26/2017   Pneumococcal Polysaccharide-23 09/05/2018   Respiratory Syncytial Virus Vaccine,Recomb Aduvanted(Arexvy) 11/09/2022   Td 12/31/1994, 12/03/2013   Zoster Recombinat (Shingrix) 04/15/2018, 08/04/2018    TDAP status: Up to date  Flu Vaccine status: Up to date  Pneumococcal vaccine status: Up to date  Covid-19 vaccine status: Information provided on how to obtain vaccines.   Qualifies for Shingles Vaccine? Yes   Zostavax completed Yes   Shingrix Completed?: Yes  Screening Tests Health Maintenance  Topic Date Due   COLONOSCOPY (Pts 45-440yrInsurance coverage will need to be confirmed)  11/18/2022   DTaP/Tdap/Td (3 - Tdap) 12/04/2023   Medicare Annual Wellness (AWV)  12/07/2023   Pneumonia Vaccine 6517Years old  Completed   INFLUENZA VACCINE  Completed   COVID-19 Vaccine  Completed   Hepatitis C Screening  Completed   Zoster Vaccines- Shingrix  Completed   HPV VACCINES   Aged Out    Health Maintenance  Health Maintenance Due  Topic Date Due   COLONOSCOPY (Pts [id number] coverage will need to be confirmed)  11/18/2022    Colorectal cancer screening: Type of screening: Colonoscopy. Completed 2013. Repeat every 10 years wants to do cologuard   Lung Cancer Screening: (Low Dose CT Chest recommended if Age 80-28-80ars, 30 pack-year currently smoking OR have quit w/in 15years.) does not qualify.   Lung Cancer Screening Referral: na  Additional Screening:  Hepatitis C Screening: does qualify; Completed 2021  Vision Screening: Recommended annual ophthalmology exams for early detection of glaucoma and other disorders of the eye. Is the patient up to date with their annual eye exam?  Yes  Who is the provider or what is the name of the office in which the patient attends annual eye exams?  [facility] If pt is not established with a provider, would they like to be referred to a provider to establish care? No .   Dental Screening: Recommended annual dental exams for proper oral hygiene  Community Resource Referral / Chronic Care Management: CRR required this visit?  No   CCM required this visit?  No      Plan:     I have personally reviewed and noted the following in the patient's chart:   Medical and social history Use of alcohol, tobacco or illicit drugs  Current medications and supplements including opioid prescriptions. Patient is not currently taking opioid prescriptions. Functional ability and status Nutritional status Physical activity Advanced directives List of other physicians Hospitalizations, surgeries, and ER visits in previous 12 months Vitals Screenings to include cognitive, depression, and falls Referrals and appointments  In addition, I have reviewed and discussed with patient certain preventive protocols, quality metrics, and best practice recommendations. A written personalized care plan for preventive services as well as  general preventive health recommendations were provided to patient.     [name], NP   12/06/2022    Virtual Visit via Telephone Note  I connected with patient 12/06/22 at 10:00 AM EST by telephone and verified that I am speaking with the correct person using two identifiers.  Location: Patient: home Provider: [facility]   I discussed the limitations, risks, security and privacy concerns of performing an evaluation and management service by telephone and the availability of in person appointments. I also discussed with the patient that there may be a patient responsible charge related to this service. The patient expressed understanding and agreed to proceed.   I discussed the assessment and treatment plan with the patient. The patient was provided an opportunity to ask questions and all were answered. The patient agreed with the plan and demonstrated an understanding of the instructions.   The patient was advised to call back or seek an in-person evaluation if the symptoms worsen or if the condition fails to improve as anticipated.  I provided 14 minutes of non-face-to-face time during this encounter.  Carlos American. Harle Battiest Avs printed and mailed

## 2022-12-11 DIAGNOSIS — H43813 Vitreous degeneration, bilateral: Secondary | ICD-10-CM | POA: Diagnosis not present

## 2022-12-11 DIAGNOSIS — Z1211 Encounter for screening for malignant neoplasm of colon: Secondary | ICD-10-CM | POA: Diagnosis not present

## 2022-12-11 DIAGNOSIS — H35373 Puckering of macula, bilateral: Secondary | ICD-10-CM | POA: Diagnosis not present

## 2022-12-11 DIAGNOSIS — H2513 Age-related nuclear cataract, bilateral: Secondary | ICD-10-CM | POA: Diagnosis not present

## 2022-12-11 DIAGNOSIS — Z1212 Encounter for screening for malignant neoplasm of rectum: Secondary | ICD-10-CM | POA: Diagnosis not present

## 2022-12-13 DIAGNOSIS — M19011 Primary osteoarthritis, right shoulder: Secondary | ICD-10-CM | POA: Diagnosis not present

## 2022-12-13 DIAGNOSIS — M25512 Pain in left shoulder: Secondary | ICD-10-CM | POA: Diagnosis not present

## 2022-12-13 DIAGNOSIS — M25511 Pain in right shoulder: Secondary | ICD-10-CM | POA: Diagnosis not present

## 2022-12-14 ENCOUNTER — Encounter: Payer: Self-pay | Admitting: Nurse Practitioner

## 2022-12-14 ENCOUNTER — Other Ambulatory Visit: Payer: Self-pay | Admitting: Nurse Practitioner

## 2022-12-14 ENCOUNTER — Ambulatory Visit (INDEPENDENT_AMBULATORY_CARE_PROVIDER_SITE_OTHER): Payer: Medicare HMO | Admitting: Nurse Practitioner

## 2022-12-14 VITALS — BP 130/76 | HR 74 | Temp 97.5°F | Ht 70.0 in | Wt 228.0 lb

## 2022-12-14 DIAGNOSIS — J410 Simple chronic bronchitis: Secondary | ICD-10-CM

## 2022-12-14 DIAGNOSIS — R350 Frequency of micturition: Secondary | ICD-10-CM | POA: Diagnosis not present

## 2022-12-14 DIAGNOSIS — G4700 Insomnia, unspecified: Secondary | ICD-10-CM | POA: Diagnosis not present

## 2022-12-14 DIAGNOSIS — I10 Essential (primary) hypertension: Secondary | ICD-10-CM

## 2022-12-14 DIAGNOSIS — Z6832 Body mass index (BMI) 32.0-32.9, adult: Secondary | ICD-10-CM

## 2022-12-14 DIAGNOSIS — E6609 Other obesity due to excess calories: Secondary | ICD-10-CM

## 2022-12-14 DIAGNOSIS — N401 Enlarged prostate with lower urinary tract symptoms: Secondary | ICD-10-CM | POA: Diagnosis not present

## 2022-12-14 DIAGNOSIS — E782 Mixed hyperlipidemia: Secondary | ICD-10-CM | POA: Diagnosis not present

## 2022-12-14 DIAGNOSIS — M1A9XX0 Chronic gout, unspecified, without tophus (tophi): Secondary | ICD-10-CM | POA: Diagnosis not present

## 2022-12-14 DIAGNOSIS — J302 Other seasonal allergic rhinitis: Secondary | ICD-10-CM | POA: Diagnosis not present

## 2022-12-14 NOTE — Progress Notes (Signed)
Careteam: Patient Care Team: Rodney Chandler, NP as PCP - General (Geriatric Medicine)  PLACE OF SERVICE:  [place] Directive information Does Patient Have a Medical Advance Directive?: Yes, Type of Advance Directive: [place];Living will, Does patient want to make changes to medical advance directive?: No - Patient declined  No Known Allergies  Chief Complaint  Patient presents with  . Medical Management of Chronic Issues    7 month follow-up. Discuss need for colonoscopy of post pone if patient refuses. NCIR verified.      HPI: Patient is a 72 y.o. male for routine follow up.   Saw Dr Veverly Fells for his shoulder yesterday. Pain today is minimal, has been using advil.   Eating a lot more sugar.      Review of Systems:  Review of Systems  Constitutional:  Negative for chills, fever and weight loss.  HENT:  Negative for tinnitus.   Respiratory:  Negative for cough, sputum production and shortness of breath.   Cardiovascular:  Negative for chest pain, palpitations and leg swelling.  Gastrointestinal:  Negative for abdominal pain, constipation, diarrhea and heartburn.  Genitourinary:  Negative for dysuria, frequency and urgency.  Musculoskeletal:  Negative for back pain, falls, joint pain and myalgias.  Skin: Negative.   Neurological:  Negative for dizziness and headaches.  Psychiatric/Behavioral:  Negative for depression and memory loss. The patient does not have insomnia.   ***  Past Medical History:  Diagnosis Date  . Backache, unspecified   . Bronchitis   . Cough variant asthma 05/03/2017  . Genital herpes   . Gout   . Hearing loss   . Hemorrhoids   . Hernia   . Herpes simplex   . Hyperglycemia   . Hyperlipidemia   . Hypertension   . Impotence   . Insomnia, unspecified   . Osteoarthrosis, unspecified whether generalized or localized, unspecified site   . Tinea pedis, right   . Unspecified disorder of male genital organs     Past Surgical History:  Procedure Laterality Date  . fracture left femur  1984  . ganglion repair of left forarm  2000  . IVP  71   ML old comp fracture  . KNEE SURGERY Right 01/21/2019   [facility]   . NASAL SEPTUM SURGERY  1997  . TONSILLECTOMY  1959  . TONSILLECTOMY    . 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   Procedure: TOTAL KNEE ARTHROPLASTY;  Surgeon: Earlie Server, MD;  Location: [facility];  Service: Orthopedics;  Laterality: Right;  . TOTAL KNEE ARTHROPLASTY Left 05/06/2020   Procedure: TOTAL KNEE ARTHROPLASTY;  Surgeon: Earlie Server, MD;  Location: [facility];  Service: Orthopedics;  Laterality: Left;  . UMBILICAL HERNIA REPAIR  2002   Social History:   reports that he quit smoking about 38 years ago. His smoking use included cigarettes. He has a 15.00 pack-year smoking history. He quit smokeless tobacco use about 33 years ago. He reports that he does not currently use alcohol. He reports that he does not use drugs.  Family History  Problem Relation Age of Onset  . COPD Mother   . Aortic aneurysm Father     Medications: Patient's Medications  New Prescriptions   No medications on file  Previous Medications   ACYCLOVIR (ZOVIRAX) 400 MG TABLET    TAKE 1 TABLET BY MOUTH EVERY DAY   AMLODIPINE (NORVASC) 5 MG TABLET    TAKE ONE TABLET BY MOUTH ONCE DAILY TO CONTROL  BLOOD PRESSURE   ATORVASTATIN (LIPITOR) 20 MG TABLET    TAKE 1 TABLET BY MOUTH EVERYDAY AT BEDTIME   FEBUXOSTAT (ULORIC) 40 MG TABLET    Take 1 tablet (40 mg total) by mouth daily.   GUAIFENESIN (MUCINEX) 600 MG 12 HR TABLET    Take 600 mg by mouth 2 (two) times daily.   LOSARTAN (COZAAR) 100 MG TABLET    TAKE ONE BY MOUTH DAILY FOR BLOOD PRESSURE CONTROL   MULTIPLE VITAMINS-MINERALS (ANTIOXIDANT PO)    Take 30 mLs by mouth daily. Aronia Berry Juice   TADALAFIL (CIALIS) 20 MG TABLET    Take 1 tablet (20 mg total) by mouth daily as needed for erectile dysfunction.   TERBINAFINE (LAMISIL) 250 MG TABLET     Please take one a day x 7days, repeat every 4 weeks x 4 months   ZOLPIDEM (AMBIEN) 5 MG TABLET    TAKE 1 TABLET BY MOUTH AT BEDTIME AS NEEDED FOR SLEEP  Modified Medications   No medications on file  Discontinued Medications   ACETAMINOPHEN (TYLENOL) 500 MG TABLET    Take 1,000 mg by mouth every 6 (six) hours as needed for headache (pain).   FLUTICASONE (FLONASE) 50 MCG/ACT NASAL SPRAY    Place 1 spray into both nostrils daily.    Physical Exam:  Vitals:   12/14/22 1001  BP: 130/76  Pulse: 74  Temp: (!) 97.5 F (36.4 C)  TempSrc: Temporal  SpO2: 96%  Weight: 228 lb (103.4 kg)  Height: '5\' 10"'$  (1.778 m)   Body mass index is 32.71 kg/m. Wt Readings from Last 3 Encounters:  12/14/22 228 lb (103.4 kg)  01/30/22 204 lb (92.5 kg)  12/29/21 199 lb (90.3 kg)    Physical Exam Constitutional:      General: He is not in acute distress.    Appearance: He is well-developed. He is obese. He is not diaphoretic.  HENT:     Head: Normocephalic and atraumatic.     Right Ear: External ear normal.     Left Ear: External ear normal.     Mouth/Throat:     Pharynx: No oropharyngeal exudate.  Eyes:     Conjunctiva/sclera: Conjunctivae normal.     Pupils: Pupils are equal, round, and reactive to light.  Cardiovascular:     Rate and Rhythm: Normal rate and regular rhythm.     Heart sounds: Normal heart sounds.  Pulmonary:     Effort: Pulmonary effort is normal.     Breath sounds: Normal breath sounds.  Abdominal:     General: Bowel sounds are normal.     Palpations: Abdomen is soft.  Musculoskeletal:        General: No tenderness.     Cervical back: Normal range of motion and neck supple.     Right lower leg: No edema.     Left lower leg: No edema.  Skin:    General: Skin is warm and dry.  Neurological:     Mental Status: He is alert and oriented to person, place, and time.    Labs reviewed: Basic Metabolic Panel: Recent Labs    12/23/21 0207 12/24/21 0857 04/30/22 1040  NA  135 133* 139  K 4.3 3.7 4.2  CL 99 99 102  CO2 '25 24 29  '$ GLUCOSE 107* 203* 123  BUN '9 10 12  '$ CREATININE 0.94 0.80 0.87  CALCIUM 9.3 9.1 9.6   Liver Function Tests: Recent Labs    12/22/21 1644 12/23/21 0207 04/30/22 1040  AST '19 19 16  '$ ALT '28 25 19  '$ ALKPHOS 124 116  --   BILITOT 0.5 0.4 0.6  PROT 7.1 7.1 6.3  ALBUMIN 2.9* 2.8*  --    No results for input(s): "LIPASE", "AMYLASE" in the last 8760 hours. No results for input(s): "AMMONIA" in the last 8760 hours. CBC: Recent Labs    12/25/21 0304 12/26/21 0219 04/30/22 1040  WBC 12.5* 9.9 10.6  NEUTROABS 6.5 4.8 6,106  HGB 12.3* 11.9* 15.2  HCT 37.7* 36.6* 42.7  MCV 95.7 95.3 93.4  PLT 644* 612* 293   Lipid Panel: No results for input(s): "CHOL", "HDL", "LDLCALC", "TRIG", "CHOLHDL", "LDLDIRECT" in the last 8760 hours. TSH: No results for input(s): "TSH" in the last 8760 hours. A1C: Lab Results  Component Value Date   HGBA1C 5.5 01/20/2019     Assessment/Plan 1. Mixed hyperlipidemia *** - Lipid panel - COMPLETE METABOLIC PANEL WITH GFR  2. Chronic gout without tophus, unspecified cause, unspecified site ***  3. Essential hypertension *** - COMPLETE METABOLIC PANEL WITH GFR - CBC with Differential/Platelet  4. Simple chronic bronchitis (HCC) ***  5. Seasonal allergies ***  6. Benign prostatic hyperplasia with urinary frequency ***  7. Insomnia, unspecified type ***  8. Class 1 obesity due to excess calories with serious comorbidity and body mass index (BMI) of 32.0 to 32.9 in adult ***    Return in about 6 months (around 06/15/2023) for routine follow up.: *** Kileigh Ortmann K. [place], [facility] [phone]

## 2022-12-14 NOTE — Patient Instructions (Addendum)
to avoid NSAIDS (Aleve, Advil, Motrin, Ibuprofen)  To use tylenol 500 mg 2 tablets every 8 hours as needed pain.   To come back next week for fasting blood work.

## 2022-12-17 ENCOUNTER — Other Ambulatory Visit: Payer: Medicare HMO

## 2022-12-17 DIAGNOSIS — I10 Essential (primary) hypertension: Secondary | ICD-10-CM | POA: Diagnosis not present

## 2022-12-17 DIAGNOSIS — E782 Mixed hyperlipidemia: Secondary | ICD-10-CM | POA: Diagnosis not present

## 2022-12-18 LAB — COMPLETE METABOLIC PANEL WITH GFR
AG Ratio: 1.8 (calc) (ref 1.0–2.5)
ALT: 16 U/L (ref 9–46)
AST: 15 U/L (ref 10–35)
Albumin: 4.6 g/dL (ref 3.6–5.1)
Alkaline phosphatase (APISO): 92 U/L (ref 35–144)
BUN: 18 mg/dL (ref 7–25)
CO2: 26 mmol/L (ref 20–32)
Calcium: 9.6 mg/dL (ref 8.6–10.3)
Chloride: 103 mmol/L (ref 98–110)
Creat: 0.91 mg/dL (ref 0.70–1.28)
Globulin: 2.5 g/dL (calc) (ref 1.9–3.7)
Glucose, Bld: 101 mg/dL — ABNORMAL HIGH (ref 65–99)
Potassium: 4.3 mmol/L (ref 3.5–5.3)
Sodium: 139 mmol/L (ref 135–146)
Total Bilirubin: 0.4 mg/dL (ref 0.2–1.2)
Total Protein: 7.1 g/dL (ref 6.1–8.1)
eGFR: 88 mL/min/{1.73_m2} (ref >=60)

## 2022-12-18 LAB — CBC WITH DIFFERENTIAL/PLATELET
Absolute Monocytes: 915 cells/uL (ref 200–950)
Basophils Absolute: 62 cells/uL (ref 0–200)
Basophils Relative: 0.6 %
Eosinophils Absolute: 31 cells/uL (ref 15–500)
Eosinophils Relative: 0.3 %
HCT: 42.7 % (ref 38.5–50.0)
Hemoglobin: 15.1 g/dL (ref 13.2–17.1)
Lymphs Abs: 3942 cells/uL — ABNORMAL HIGH (ref 850–3900)
MCH: 32.2 pg (ref 27.0–33.0)
MCHC: 35.4 g/dL (ref 32.0–36.0)
MCV: 91 fL (ref 80.0–100.0)
MPV: 10.4 fL (ref 7.5–12.5)
Monocytes Relative: 8.8 %
Neutro Abs: 5450 cells/uL (ref 1500–7800)
Neutrophils Relative %: 52.4 %
Platelets: 316 10*3/uL (ref 140–400)
RBC: 4.69 10*6/uL (ref 4.20–5.80)
RDW: 11.8 % (ref 11.0–15.0)
Total Lymphocyte: 37.9 %
WBC: 10.4 10*3/uL (ref 3.8–10.8)

## 2022-12-18 LAB — LIPID PANEL
Cholesterol: 194 mg/dL (ref <200)
HDL: 58 mg/dL (ref >=40)
LDL Cholesterol (Calc): 118 mg/dL (calc) — ABNORMAL HIGH
Non-HDL Cholesterol (Calc): 136 mg/dL (calc) — ABNORMAL HIGH (ref <130)
Total CHOL/HDL Ratio: 3.3 (calc) (ref <5.0)
Triglycerides: 82 mg/dL (ref <150)

## 2022-12-18 LAB — COLOGUARD: COLOGUARD: NEGATIVE

## 2023-01-15 DIAGNOSIS — H903 Sensorineural hearing loss, bilateral: Secondary | ICD-10-CM | POA: Diagnosis not present

## 2023-01-16 DIAGNOSIS — H903 Sensorineural hearing loss, bilateral: Secondary | ICD-10-CM | POA: Diagnosis not present

## 2023-01-17 ENCOUNTER — Ambulatory Visit (INDEPENDENT_AMBULATORY_CARE_PROVIDER_SITE_OTHER): Payer: Medicare HMO

## 2023-01-17 DIAGNOSIS — B351 Tinea unguium: Secondary | ICD-10-CM

## 2023-01-17 NOTE — Patient Instructions (Signed)

## 2023-01-17 NOTE — Progress Notes (Signed)
Patient presents today for the 3rd laser treatment. Diagnosed with mycotic nail infection by Dr. Paulla Dolly.   Toenail most affected right 1st.  The right 1st toenail has improve in appearance.   All other systems are negative.  Nails were filed thin. Laser therapy was administered to 1st toenails right and patient tolerated the treatment well. All safety precautions were in place.     Follow up in 6 weeks for laser # 4.  Patient taking pulse dose Rx of Terbinafine as ordered.   Patient has completed the recommended laser treatments. He will follow up with Dr. Paulla Dolly in 3 months to evaluate progress.

## 2023-01-28 DIAGNOSIS — H3581 Retinal edema: Secondary | ICD-10-CM | POA: Diagnosis not present

## 2023-01-28 DIAGNOSIS — H2512 Age-related nuclear cataract, left eye: Secondary | ICD-10-CM | POA: Diagnosis not present

## 2023-01-28 DIAGNOSIS — H25812 Combined forms of age-related cataract, left eye: Secondary | ICD-10-CM | POA: Diagnosis not present

## 2023-01-28 DIAGNOSIS — H35372 Puckering of macula, left eye: Secondary | ICD-10-CM | POA: Diagnosis not present

## 2023-01-28 DIAGNOSIS — H35412 Lattice degeneration of retina, left eye: Secondary | ICD-10-CM | POA: Diagnosis not present

## 2023-01-29 DIAGNOSIS — Z9889 Other specified postprocedural states: Secondary | ICD-10-CM | POA: Diagnosis not present

## 2023-01-29 DIAGNOSIS — H35372 Puckering of macula, left eye: Secondary | ICD-10-CM | POA: Diagnosis not present

## 2023-01-29 DIAGNOSIS — H2512 Age-related nuclear cataract, left eye: Secondary | ICD-10-CM | POA: Diagnosis not present

## 2023-02-03 ENCOUNTER — Other Ambulatory Visit: Payer: Self-pay | Admitting: Nurse Practitioner

## 2023-02-05 DIAGNOSIS — H2512 Age-related nuclear cataract, left eye: Secondary | ICD-10-CM | POA: Diagnosis not present

## 2023-02-05 DIAGNOSIS — H43391 Other vitreous opacities, right eye: Secondary | ICD-10-CM | POA: Diagnosis not present

## 2023-02-05 DIAGNOSIS — Z9889 Other specified postprocedural states: Secondary | ICD-10-CM | POA: Diagnosis not present

## 2023-02-05 DIAGNOSIS — H35372 Puckering of macula, left eye: Secondary | ICD-10-CM | POA: Diagnosis not present

## 2023-02-15 ENCOUNTER — Telehealth: Payer: Self-pay

## 2023-02-15 ENCOUNTER — Telehealth (INDEPENDENT_AMBULATORY_CARE_PROVIDER_SITE_OTHER): Payer: Medicare HMO | Admitting: Family

## 2023-02-15 DIAGNOSIS — Z6832 Body mass index (BMI) 32.0-32.9, adult: Secondary | ICD-10-CM

## 2023-02-15 DIAGNOSIS — E6609 Other obesity due to excess calories: Secondary | ICD-10-CM

## 2023-02-15 MED ORDER — WEGOVY 0.25 MG/0.5ML ~~LOC~~ SOAJ: 0.2500 mg | SUBCUTANEOUS | 0 refills | Status: AC

## 2023-02-15 NOTE — Progress Notes (Signed)
This service is provided via telemedicine  No vital signs collected/recorded due to the encounter was a telemedicine visit.   Location of patient (ex: home, work):  Home  Patient consents to a telephone visit:  Yes, see encounter dated 02/15/2023  Location of the provider (ex: office, home):  [facility]  Name of any referring provider:  Sherrie Mustache, NP  Names of all persons participating in the telemedicine service and their role in the encounter:  Marlowe Sax, NP, Carroll Kinds, CMA and patient  Time spent on call:  8 minutes with medical assistant  Location:      Place of Service:    Provider: Shamia Uppal FNP-C  [name], NP  Patient Care Team: [name], NP as PCP - General (Geriatric Medicine)  Extended Emergency Contact Information Primary Emergency Contact: Doughty,Sandy Address: [address]          [place], [place] [address] Johnnette Litter of [place] Phone: [phone] Mobile Phone: [phone] Relation: Significant other  Code Status:  Full Code  Goals of care: Advanced Directive information    12/14/2022   10:01 AM  Advanced Directives  Does Patient Have a Medical Advance Directive? Yes  Type of Paramedic of [place];Living will  Does patient want to make changes to medical advance directive? No - Patient declined     Chief Complaint  Patient presents with   Acute Visit    Patient would like to discuss ozempic. Patient doesn't want to take medication due to him not being diabetic. He was told it was for diabetics. He would like to take [facility] because it's not for diabetics.    HPI:  Pt is a 73 y.o. male seen today for an acute visit to discuss weight management.states weighed 325 lbs.Latest weight with PCP EuBanks,NP was 228 lbs,BMI 32.71 on 12/14/2022.states spoke to a doctor online to order Ozempic but his girlfriend told him Ozempic was for diabetes he  needs to get [facility] for weight loss.He would like wegovy to be prescribed. He denies any history of thyroid cancer or nodule.   States does exercise by walking for 15 minutes then rides bike for 20 minutes five times per week. Tells me he eats everything and tends to eat out most of the time.Discussed dietary modification including a low saturated fats, low carbohydrates and high vegetable diet and cutting down on sweets /drinks.    Past Medical History:  Diagnosis Date   Backache, unspecified    Bronchitis    Cough variant asthma 05/03/2017   Genital herpes    Gout    Hearing loss    Hemorrhoids    Hernia    Herpes simplex    Hyperglycemia    Hyperlipidemia    Hypertension    Impotence    Insomnia, unspecified    Osteoarthrosis, unspecified whether generalized or localized, unspecified site    Tinea pedis, right    Unspecified disorder of male genital organs    Past Surgical History:  Procedure Laterality Date   fracture left femur  1984   ganglion repair of left forarm  2000   IVP  2   ML old comp fracture   KNEE SURGERY Right 01/21/2019   [place]   TONSILLECTOMY     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   Procedure: TOTAL KNEE ARTHROPLASTY;  Surgeon: Earlie Server, MD;  Location: [facility];  Service: Orthopedics;  Laterality: Right;   TOTAL KNEE ARTHROPLASTY Left 05/06/2020   Procedure: TOTAL KNEE ARTHROPLASTY;  Surgeon: Earlie Server, MD;  Location: [facility];  Service: Orthopedics;  Laterality: Left;   UMBILICAL HERNIA REPAIR  2002    No Known Allergies  Outpatient Encounter Medications as of 02/15/2023  Medication Sig   acyclovir (ZOVIRAX) 400 MG tablet TAKE 1 TABLET BY MOUTH EVERY DAY   amLODipine (NORVASC) 5 MG tablet TAKE ONE TABLET BY MOUTH ONCE DAILY TO CONTROL BLOOD PRESSURE   atorvastatin (LIPITOR) 20 MG tablet TAKE 1 TABLET BY MOUTH EVERYDAY AT BEDTIME   febuxostat (ULORIC) 40 MG tablet Take 1  tablet (40 mg total) by mouth daily.   guaiFENesin (MUCINEX) 600 MG 12 hr tablet Take 600 mg by mouth 2 (two) times daily.   losartan (COZAAR) 100 MG tablet TAKE ONE BY MOUTH DAILY FOR BLOOD PRESSURE CONTROL   Multiple Vitamins-Minerals (ANTIOXIDANT PO) Take 30 mLs by mouth daily. Aronia Berry Juice   tadalafil (CIALIS) 20 MG tablet Take 1 tablet (20 mg total) by mouth daily as needed for erectile dysfunction.   terbinafine (LAMISIL) 250 MG tablet Please take one a day x 7days, repeat every 4 weeks x 4 months   zolpidem (AMBIEN) 5 MG tablet TAKE 1 TABLET BY MOUTH AT BEDTIME AS NEEDED FOR SLEEP   No facility-administered encounter medications on file as of 02/15/2023.    Review of Systems  Constitutional:  Positive for unexpected weight change. Negative for appetite change, chills, fatigue and fever.       Weight gain 325 lbs   HENT:  Negative for congestion, hearing loss, postnasal drip, rhinorrhea, sinus pressure, sinus pain, sneezing, sore throat, tinnitus and trouble swallowing.   Eyes:  Negative for pain, discharge, redness, itching and visual disturbance.  Respiratory:  Negative for cough, chest tightness, shortness of breath and wheezing.   Cardiovascular:  Negative for chest pain, palpitations and leg swelling.  Gastrointestinal:  Negative for abdominal distention, abdominal pain, diarrhea, nausea and vomiting.  Endocrine: Negative for cold intolerance, heat intolerance, polydipsia, polyphagia and polyuria.  Musculoskeletal:  Negative for arthralgias, back pain, gait problem, joint swelling, myalgias, neck pain and neck stiffness.  Skin:  Negative for color change, pallor and rash.    Immunization History  Administered Date(s) Administered   COVID-19, mRNA, vaccine(Comirnaty)12 years and older 10/19/2022   Covid-19, Mrna,Vaccine(Spikevax)86yr and older 10/19/2022   Fluad Quad(high Dose 65+) 08/20/2019, 10/28/2020, 10/09/2021, 10/19/2022   Influenza, High Dose Seasonal PF  10/17/2017, 09/05/2018   Influenza,inj,Quad PF,6+ Mos 12/02/2013, 03/07/2016, 10/10/2016   Influenza-Unspecified 12/05/2011   PFIZER(Purple Top)SARS-COV-2 Vaccination 01/22/2020, 02/12/2020, 09/14/2020, 04/18/2021   Pfizer Covid-19 Vaccine Bivalent Booster 18yr& up 09/19/2021, 05/10/2022   Pneumococcal Conjugate-13 08/26/2017   Pneumococcal Polysaccharide-23 09/05/2018   Respiratory Syncytial Virus Vaccine,Recomb Aduvanted(Arexvy) 10/19/2022, 11/09/2022   Td 12/31/1994, 12/03/2013   Zoster Recombinat (Shingrix) 04/15/2018, 08/04/2018   Pertinent  Health Maintenance Due  Topic Date Due   COLONOSCOPY (Pts [id number] coverage will need to be confirmed)  11/18/2022   INFLUENZA VACCINE  Completed      12/25/2021    9:00 PM 12/26/2021   10:27 AM 04/30/2022    9:47 AM 12/06/2022    9:45 AM 12/14/2022   10:00 AM  [place] the past year?   0 0 0  Was there an injury with Fall?   0 0 0  Fall Risk Category Calculator   0 0 0  Fall Risk Category (Retired)   Low Low Low  (RETIRED) Patient Fall Risk Level Moderate fall risk Moderate fall risk Low fall risk Low fall risk Low fall risk  Patient at Risk for Falls Due to   No Fall Risks No Fall Risks No Fall Risks  Fall risk Follow up   Falls evaluation completed Falls evaluation completed Falls evaluation completed   Functional Status Survey:    There were no vitals filed for this visit. There is no height or weight on file to calculate BMI. Physical Exam Constitutional:      General: He is not in acute distress.    Appearance: He is obese. He is not ill-appearing.  HENT:     Head: Normocephalic.  Pulmonary:     Effort: Pulmonary effort is normal. No respiratory distress.  Neurological:     Mental Status: He is alert and oriented to person, place, and time.     Gait: Gait normal.  Psychiatric:        Mood and Affect: Mood normal.        Behavior: Behavior normal.     Labs reviewed: Recent Labs     04/30/22 1040 12/17/22 0913  NA 139 139  K 4.2 4.3  CL 102 103  CO2 29 26  GLUCOSE 123 101*  BUN 12 18  CREATININE 0.87 0.91  CALCIUM 9.6 9.6   Recent Labs    04/30/22 1040 12/17/22 0913  AST 16 15  ALT 19 16  BILITOT 0.6 0.4  PROT 6.3 7.1   Recent Labs    04/30/22 1040 12/17/22 0913  WBC 10.6 10.4  NEUTROABS 6,106 5,450  HGB 15.2 15.1  HCT 42.7 42.7  MCV 93.4 91.0  PLT 293 316   No results found for: "TSH" Lab Results  Component Value Date   HGBA1C 5.5 01/20/2019   Lab Results  Component Value Date   CHOL 194 12/17/2022   HDL 58 12/17/2022   LDLCALC 118 (H) 12/17/2022   TRIG 82 12/17/2022   CHOLHDL 3.3 12/17/2022    Significant Diagnostic Results in last 30 days:  No results found.  Assessment/Plan 1. Class 1 obesity due to excess calories with serious comorbidity and body mass index (BMI) of 32.0 to 32.9 in adult Latest BMI 32.71  - dietary modification and exercise advised. - Start on [place] as requested. - declined referral to weight management for now  - Semaglutide-Weight Management (WEGOVY) 0.25 MG/0.5ML SOAJ; Inject 0.25 mg into the skin once a week.  Dispense: 2 mL; Refill: 0 - advised to follow up in one month for evaluation then adjust wegovy if tolerating well.   2. BMI 32.0-32.9,adult BMI 32.71 with associated comorbidity Hypertension and hyperlipidemia  -  dietary modification and exercise advised.  - start on Wegovy as above  - recommend weight management referral but declined would like to try lifestyle modification and wegovy then consider weight management if no weight loss.   Family/ staff Communication: Reviewed plan of care with patient verbalized understanding.   Labs/tests ordered: None   Next Appointment: Return in about 1 month (around 03/16/2023) for weight management .  I connected with  Garret Reddish on 02/15/23 by a video enabled telemedicine application and verified that I am speaking with the correct person using two  identifiers.   I discussed the limitations of evaluation and management by telemedicine. The patient expressed understanding and agreed to proceed.   Spent 11 minutes of face to face with patient  >50%  time spent counseling; reviewing medical record; labs; and developing future plan of care.   Sandrea Hughs, NP

## 2023-02-15 NOTE — Telephone Encounter (Signed)
LATE ENTRY:  Mr. adalid, horrall are scheduled for a virtual visit with your provider today.    Just as we do with appointments in the office, we must obtain your consent to participate.  Your consent will be active for this visit and any virtual visit you may have with one of our providers in the next 365 days.    If you have a MyChart account, I can also send a copy of this consent to you electronically.  All virtual visits are billed to your insurance company just like a traditional visit in the office.  As this is a virtual visit, video technology does not allow for your provider to perform a traditional examination.  This may limit your provider's ability to fully assess your condition.  If your provider identifies any concerns that need to be evaluated in person or the need to arrange testing such as labs, EKG, etc, we will make arrangements to do so.    Although advances in technology are sophisticated, we cannot ensure that it will always work on either your end or our end.  If the connection with a video visit is poor, we may have to switch to a telephone visit.  With either a video or telephone visit, we are not always able to ensure that we have a secure connection.   I need to obtain your verbal consent now.   Are you willing to proceed with your visit today?   XAIVIER BELDIN has provided verbal consent on 02/15/2023 for a virtual visit (video or telephone).   Carroll Kinds, [facility] 02/15/2023  8:56 AM

## 2023-02-18 ENCOUNTER — Encounter: Payer: Self-pay | Admitting: Orthopedic Surgery

## 2023-02-18 ENCOUNTER — Ambulatory Visit (INDEPENDENT_AMBULATORY_CARE_PROVIDER_SITE_OTHER): Payer: Medicare HMO | Admitting: Orthopedic Surgery

## 2023-02-18 VITALS — BP 124/80 | HR 79 | Temp 97.8°F | Resp 17 | Ht 70.0 in | Wt 227.4 lb

## 2023-02-18 DIAGNOSIS — J019 Acute sinusitis, unspecified: Secondary | ICD-10-CM

## 2023-02-18 DIAGNOSIS — R051 Acute cough: Secondary | ICD-10-CM | POA: Diagnosis not present

## 2023-02-18 LAB — POC COVID19 BINAXNOW: SARS Coronavirus 2 Ag: NEGATIVE

## 2023-02-18 LAB — POCT INFLUENZA A/B
Influenza A, POC: NEGATIVE
Influenza B, POC: NEGATIVE

## 2023-02-18 MED ORDER — PREDNISONE 10 MG PO TABS: ORAL_TABLET | ORAL | 0 refills | Status: AC

## 2023-02-18 NOTE — Progress Notes (Signed)
Careteam: Patient Care Team: [name], NP as PCP - General (Geriatric Medicine)  Seen by: Windell Moulding, AGNP-C  PLACE OF SERVICE:  [place] Directive information Does Patient Have a Medical Advance Directive?: Yes, Type of Advance Directive: [place];Living will;Out of facility DNR (pink MOST or yellow form), Does patient want to make changes to medical advance directive?: No - Patient declined  No Known Allergies  Chief Complaint  Patient presents with   Acute Visit    Patient complains of cough, and sinus drainage.      HPI: Patient is a 73 y.o. male seen today for acute visit due to ongoing nasal congestion and drainage.   Girlfriend has recently been sick with sinus infection. 02/12 he began to have increased nasal congestion and unable to sleep at night. Sinus drainage also stimulating dry cough. 02/17 a family friend gave him a Zpack. He has not taken anything else. Denies fever, sore throat, body aches, and malaise. Afebrile. Vitals stable.    Review of Systems:  Review of Systems  Constitutional:  Positive for malaise/fatigue. Negative for chills and fever.  HENT:  Positive for congestion. Negative for sinus pain and sore throat.   Respiratory:  Positive for cough. Negative for sputum production, shortness of breath and wheezing.   Cardiovascular:  Negative for chest pain and leg swelling.  Gastrointestinal:  Negative for nausea and vomiting.  Musculoskeletal:  Negative for myalgias.  Neurological:  Negative for headaches.  Psychiatric/Behavioral:  Negative for depression. The patient is not nervous/anxious.     Past Medical History:  Diagnosis Date   Backache, unspecified    Bronchitis    Cough variant asthma 05/03/2017   Genital herpes    Gout    Hearing loss    Hemorrhoids    Hernia    Herpes simplex    Hyperglycemia    Hyperlipidemia    Hypertension    Impotence    Insomnia, unspecified    Osteoarthrosis,  unspecified whether generalized or localized, unspecified site    Tinea pedis, right    Unspecified disorder of male genital organs    Past Surgical History:  Procedure Laterality Date   fracture left femur  1984   ganglion repair of left forarm  2000   IVP  16   ML old comp fracture   KNEE SURGERY Right 01/21/2019   [place]   TONSILLECTOMY     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   Procedure: TOTAL KNEE ARTHROPLASTY;  Surgeon: Earlie Server, MD;  [facility];  Service: Orthopedics;  Laterality: Right;   TOTAL KNEE ARTHROPLASTY Left 05/06/2020   Procedure: TOTAL KNEE ARTHROPLASTY;  Surgeon: Earlie Server, MD;  Location[facility];  Service: Orthopedics;  Laterality: Left;   UMBILICAL HERNIA REPAIR  2002   Social History:   reports that he quit smoking about 39 years ago. His smoking use included cigarettes. He has a 15.00 pack-year smoking history. He quit smokeless tobacco use about 34 years ago. He reports that he does not currently use alcohol. He reports that he does not use drugs.  Family History  Problem Relation Age of Onset   COPD Mother    Aortic aneurysm Father     Medications: Patient's Medications  New Prescriptions   No medications on file  Previous Medications   ACYCLOVIR (ZOVIRAX) 400 MG TABLET    TAKE 1 TABLET BY  MOUTH EVERY DAY   AMLODIPINE (NORVASC) 5 MG TABLET    TAKE ONE TABLET BY MOUTH ONCE DAILY TO CONTROL BLOOD PRESSURE   ATORVASTATIN (LIPITOR) 20 MG TABLET    TAKE 1 TABLET BY MOUTH EVERYDAY AT BEDTIME   FEBUXOSTAT (ULORIC) 40 MG TABLET    Take 1 tablet (40 mg total) by mouth daily.   GUAIFENESIN (MUCINEX) 600 MG 12 HR TABLET    Take 600 mg by mouth 2 (two) times daily.   LOSARTAN (COZAAR) 100 MG TABLET    TAKE ONE BY MOUTH DAILY FOR BLOOD PRESSURE CONTROL   MULTIPLE VITAMINS-MINERALS (ANTIOXIDANT PO)    Take 30 mLs by mouth daily. Aronia Berry Juice   SEMAGLUTIDE-WEIGHT MANAGEMENT  (WEGOVY) 0.25 MG/0.5ML SOAJ    Inject 0.25 mg into the skin once a week.   TADALAFIL (CIALIS) 20 MG TABLET    Take 1 tablet (20 mg total) by mouth daily as needed for erectile dysfunction.   TERBINAFINE (LAMISIL) 250 MG TABLET    Please take one a day x 7days, repeat every 4 weeks x 4 months   ZOLPIDEM (AMBIEN) 5 MG TABLET    TAKE 1 TABLET BY MOUTH AT BEDTIME AS NEEDED FOR SLEEP  Modified Medications   No medications on file  Discontinued Medications   No medications on file    Physical Exam:  Vitals:   02/18/23 1136  BP: 124/80  Pulse: 79  Resp: 17  Temp: 97.8 F (36.6 C)  SpO2: 97%  Weight: 227 lb 6.4 oz (103.1 kg)  Height: 5' 10"$  (1.778 m)   Body mass index is 32.63 kg/m. Wt Readings from Last 3 Encounters:  02/18/23 227 lb 6.4 oz (103.1 kg)  12/14/22 228 lb (103.4 kg)  01/30/22 204 lb (92.5 kg)    Physical Exam Vitals reviewed.  Constitutional:      General: He is not in acute distress. HENT:     Head: Normocephalic.     Right Ear: Tympanic membrane normal.     Left Ear: Tympanic membrane normal.     Nose: Congestion present.     Right Turbinates: Enlarged and swollen.     Left Turbinates: Enlarged and swollen.     Right Sinus: No maxillary sinus tenderness or frontal sinus tenderness.     Left Sinus: No maxillary sinus tenderness or frontal sinus tenderness.     Mouth/Throat:     Mouth: Mucous membranes are moist.  Eyes:     General:        Right eye: No discharge.        Left eye: No discharge.  Cardiovascular:     Rate and Rhythm: Normal rate and regular rhythm.     Pulses: Normal pulses.     Heart sounds: Normal heart sounds.  Pulmonary:     Effort: Pulmonary effort is normal. No respiratory distress.     Breath sounds: Normal breath sounds. No wheezing.  Abdominal:     General: Bowel sounds are normal. There is no distension.     Palpations: Abdomen is soft.     Tenderness: There is no abdominal tenderness.  Musculoskeletal:     Cervical back:  Neck supple.     Right lower leg: No edema.     Left lower leg: No edema.  Lymphadenopathy:     Cervical: No cervical adenopathy.  Skin:    General: Skin is warm.     Capillary Refill: Capillary refill takes less than 2 seconds.  Neurological:  General: No focal deficit present.     Mental Status: He is alert and oriented to person, place, and time.  Psychiatric:        Mood and Affect: Mood normal.        Behavior: Behavior normal.     Labs reviewed: Basic Metabolic Panel: Recent Labs    04/30/22 1040 12/17/22 0913  NA 139 139  K 4.2 4.3  CL 102 103  CO2 29 26  GLUCOSE 123 101*  BUN 12 18  CREATININE 0.87 0.91  CALCIUM 9.6 9.6   Liver Function Tests: Recent Labs    04/30/22 1040 12/17/22 0913  AST 16 15  ALT 19 16  BILITOT 0.6 0.4  PROT 6.3 7.1   No results for input(s): "LIPASE", "AMYLASE" in the last 8760 hours. No results for input(s): "AMMONIA" in the last 8760 hours. CBC: Recent Labs    04/30/22 1040 12/17/22 0913  WBC 10.6 10.4  NEUTROABS 6,106 5,450  HGB 15.2 15.1  HCT 42.7 42.7  MCV 93.4 91.0  PLT 293 316   Lipid Panel: Recent Labs    12/17/22 0913  CHOL 194  HDL 58  LDLCALC 118*  TRIG 82  CHOLHDL 3.3   TSH: No results for input(s): "TSH" in the last 8760 hours. A1C: Lab Results  Component Value Date   HGBA1C 5.5 01/20/2019     Assessment/Plan 1. Acute cough - onset 02/11/2023, girlfriend sick - POC Influenza A/B> negative - POC COVID-19> negative - finish z pack - predniSONE (DELTASONE) 10 MG tablet; Take 4 tablets (40 mg total) by mouth daily with breakfast for 1 day, THEN 3 tablets (30 mg total) daily with breakfast for 1 day, THEN 2 tablets (20 mg total) daily with breakfast for 1 day, THEN 1 tablet (10 mg total) daily with breakfast for 1 day, THEN 0.5 tablets (5 mg total) daily with breakfast for 1 day.  Dispense: 10.5 tablet; Refill: 0  2. Acute non-recurrent sinusitis, unspecified location - onset 02/11/2023 -  nasal turbinates swollen, no facial/maxillary tenderness - suspect viral - recommend antihistamine to dry nasal passages> discussed zyrtec and Xyzal - recommend saline nasal spray prn - see above - predniSONE (DELTASONE) 10 MG tablet; Take 4 tablets (40 mg total) by mouth daily with breakfast for 1 day, THEN 3 tablets (30 mg total) daily with breakfast for 1 day, THEN 2 tablets (20 mg total) daily with breakfast for 1 day, THEN 1 tablet (10 mg total) daily with breakfast for 1 day, THEN 0.5 tablets (5 mg total) daily with breakfast for 1 day.  Dispense: 10.5 tablet; Refill: 0  Total time: 22 minutes. Greater than 50% of total time spent doing patient education regarding acute cough and nasal congestion including symptom/medication management.    Next appt: none Tramayne Sebesta Gerber, [facility] [phone]

## 2023-02-18 NOTE — Patient Instructions (Signed)
Please purchase Zyrtec or Xyzal at pharmacy> take one every evening x 14 days  Please purchase saline nasal spray> 1 spray to each nostril as needed  Prednisone prescribed to help with swelling of nasal passages> take in the morning> tapered dose x 5 days  [employer]

## 2023-03-06 DIAGNOSIS — H35372 Puckering of macula, left eye: Secondary | ICD-10-CM | POA: Diagnosis not present

## 2023-03-06 DIAGNOSIS — H3581 Retinal edema: Secondary | ICD-10-CM | POA: Diagnosis not present

## 2023-03-06 DIAGNOSIS — H2512 Age-related nuclear cataract, left eye: Secondary | ICD-10-CM | POA: Diagnosis not present

## 2023-03-06 DIAGNOSIS — H43391 Other vitreous opacities, right eye: Secondary | ICD-10-CM | POA: Diagnosis not present

## 2023-03-06 DIAGNOSIS — Z9889 Other specified postprocedural states: Secondary | ICD-10-CM | POA: Diagnosis not present

## 2023-03-12 ENCOUNTER — Ambulatory Visit: Payer: Medicare HMO | Admitting: Primary Care

## 2023-03-12 ENCOUNTER — Ambulatory Visit (INDEPENDENT_AMBULATORY_CARE_PROVIDER_SITE_OTHER): Payer: Medicare HMO

## 2023-03-12 ENCOUNTER — Encounter: Payer: Self-pay | Admitting: Primary Care

## 2023-03-12 VITALS — BP 122/76 | HR 79 | Temp 97.9°F | Ht 71.0 in | Wt 226.0 lb

## 2023-03-12 DIAGNOSIS — J90 Pleural effusion, not elsewhere classified: Secondary | ICD-10-CM | POA: Diagnosis not present

## 2023-03-12 DIAGNOSIS — J45991 Cough variant asthma: Secondary | ICD-10-CM

## 2023-03-12 DIAGNOSIS — J410 Simple chronic bronchitis: Secondary | ICD-10-CM | POA: Diagnosis not present

## 2023-03-12 DIAGNOSIS — J209 Acute bronchitis, unspecified: Secondary | ICD-10-CM | POA: Diagnosis not present

## 2023-03-12 DIAGNOSIS — R059 Cough, unspecified: Secondary | ICD-10-CM | POA: Diagnosis not present

## 2023-03-12 MED ORDER — ALBUTEROL SULFATE (2.5 MG/3ML) 0.083% IN NEBU
2.5000 mg | INHALATION_SOLUTION | Freq: Once | RESPIRATORY_TRACT | Status: AC
  Administered 2023-03-12: 2.5 mg via RESPIRATORY_TRACT

## 2023-03-12 MED ORDER — FLUTICASONE PROPIONATE 50 MCG/ACT NA SUSP: 1.0000 | Freq: Every day | NASAL | 2 refills | Status: DC

## 2023-03-12 NOTE — Patient Instructions (Addendum)
Recommendations: - Continue zyrtec '10mg'$  daily - Start flonase nasal spray - 1 spray per nostril daily  - Increased mucinex '600mg'$  two tablets daily x 5 days; Then '600mg'$  twice daily until congestion/cough is better  - Use flutter valve three times a day to loosen chest congestion  - Use Albuterol inhaler 2 puffs every 4-6 hours for shortness of breath/cough   Orders: - CXR today - Flutter valve  - Change CPAP pressure 5-20cm h20   Rx: - Albuterol 2 puffs every 4-6 hours   Follow-up: - 2 weeks with Eustaquio Maize NP

## 2023-03-12 NOTE — Progress Notes (Signed)
@Patient  ID: Rodney Foster, male    DOB: 04-Dec-1950, 73 y.o.   MRN: [id number]  Chief Complaint  Patient presents with   Acute Visit    Coughing Nasal drainage  Wheezing     Referring provider: [name], NP  HPI: 73 year old male, former smoker quit in 1985.  Past medical history significant for hypertension, community-acquired pneumonia, pleural effusion, cough variant asthma, hyperlipidemia, gout.   03/12/2023- Interim hx  This is a 73 year old male, patient of Dr. Erin Fulling who was last seen in January 2023 for follow-up pneumonia and pleural effusion. He had chest tube and TPA/DNAse therapy for loculated right pleural effusion along with prolonged Augmentin course with significant improvement. 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 No further interventions needed, plan to follow-up as needed.  Patient presents today for acute visit. He has had a cough for three weeks with associated wheezing and nasal congestion. Sinus drainage. Cough is productive. He took zpack he had on hand.   Airview download 02/09/23-03/10/23 Usage 30/30 days; 97% > 4 hours Average usage 7 hours 18 mins Pressure 5-15cm h20 914.9cm h20-95%) Airleaks 5L/min (95%) AHI 14.3  No Known Allergies  Immunization History  Administered Date(s) Administered   COVID-19, mRNA, vaccine(Comirnaty)12 years and older 10/19/2022   Covid-19, Mrna,Vaccine(Spikevax)53yrs and older 10/19/2022   Fluad Quad(high Dose 65+) 08/20/2019, 10/28/2020, 10/09/2021, 10/19/2022   Influenza, High Dose Seasonal PF 10/17/2017, 09/05/2018   Influenza,inj,Quad PF,6+ Mos 12/02/2013, 03/07/2016, 10/10/2016   Influenza-Unspecified 12/05/2011   PFIZER(Purple Top)SARS-COV-2 Vaccination 01/22/2020, 02/12/2020, 09/14/2020, 04/18/2021   Pfizer Covid-19 Vaccine Bivalent Booster 39yrs & up 09/19/2021, 05/10/2022   Pneumococcal Conjugate-13 08/26/2017   Pneumococcal  Polysaccharide-23 09/05/2018   Respiratory Syncytial Virus Vaccine,Recomb Aduvanted(Arexvy) 10/19/2022, 11/09/2022   Td 12/31/1994, 12/03/2013   Zoster Recombinat (Shingrix) 04/15/2018, 08/04/2018    Past Medical History:  Diagnosis Date   Backache, unspecified    Bronchitis    Cough variant asthma 05/03/2017   Genital herpes    Gout    Hearing loss    Hemorrhoids    Hernia    Herpes simplex    Hyperglycemia    Hyperlipidemia    Hypertension    Impotence    Insomnia, unspecified    Osteoarthrosis, unspecified whether generalized or localized, unspecified site    Tinea pedis, right    Unspecified disorder of male genital organs     Tobacco History: Social History   Tobacco Use  Smoking Status Former   Packs/day: 1.00   Years: 15.00   Additional pack years: 0.00   Total pack years: 15.00   Types: Cigarettes   Quit date: 01/01/1984   Years since quitting: 39.2  Smokeless Tobacco Former   Quit date: 1990   Counseling given: Not Answered   Outpatient Medications Prior to Visit  Medication Sig Dispense Refill   amLODipine (NORVASC) 5 MG tablet TAKE ONE TABLET BY MOUTH ONCE DAILY TO CONTROL BLOOD PRESSURE 90 tablet 3   atorvastatin (LIPITOR) 20 MG tablet TAKE 1 TABLET BY MOUTH EVERYDAY AT BEDTIME 90 tablet 3   febuxostat (ULORIC) 40 MG tablet Take 1 tablet (40 mg total) by mouth daily. 90 tablet 3   guaiFENesin (MUCINEX) 600 MG 12 hr tablet Take 600 mg by mouth 2 (two) times daily.     losartan (COZAAR) 100 MG tablet TAKE ONE BY MOUTH DAILY FOR BLOOD PRESSURE CONTROL 90 tablet 1   Multiple Vitamins-Minerals (ANTIOXIDANT PO) Take 30  mLs by mouth daily. Aronia Berry Juice     Semaglutide-Weight Management (WEGOVY) 0.25 MG/0.5ML SOAJ Inject 0.25 mg into the skin once a week. 2 mL 0   tadalafil (CIALIS) 20 MG tablet Take 1 tablet (20 mg total) by mouth daily as needed for erectile dysfunction. 4 tablet 3   terbinafine (LAMISIL) 250 MG tablet Please take one a day x 7days,  repeat every 4 weeks x 4 months 28 tablet 0   zolpidem (AMBIEN) 5 MG tablet TAKE 1 TABLET BY MOUTH AT BEDTIME AS NEEDED FOR SLEEP 30 tablet 3   acyclovir (ZOVIRAX) 400 MG tablet TAKE 1 TABLET BY MOUTH EVERY DAY (Patient not taking: Reported on 03/12/2023) 90 tablet 1   No facility-administered medications prior to visit.      Review of Systems  Review of Systems  Constitutional: Negative.   HENT:  Positive for postnasal drip.   Respiratory:  Positive for cough and wheezing.   Cardiovascular: Negative.      Physical Exam  BP 122/76 (BP Location: Left Arm, Patient Position: Sitting, Cuff Size: Large)   Pulse 79   Temp 97.9 F (36.6 C) (Oral)   Ht 5\' 11"  (1.803 m)   Wt 226 lb (102.5 kg)   SpO2 94%   BMI 31.52 kg/m  Physical Exam Cardiovascular:     Rate and Rhythm: Normal rate and regular rhythm.  Pulmonary:     Effort: Pulmonary effort is normal.     Breath sounds: Rhonchi present. No wheezing or rales.     Comments: Rhonchi to anterior and posterior upper chest lung  Skin:    General: Skin is warm and dry.  Neurological:     General: No focal deficit present.     Mental Status: He is oriented to person, place, and time. Mental status is at baseline.  Psychiatric:        Mood and Affect: Mood normal.        Behavior: Behavior normal.        Thought Content: Thought content normal.        Judgment: Judgment normal.      Lab Results:  CBC    Component Value Date/Time   WBC 10.4 12/17/2022 0913   RBC 4.69 12/17/2022 0913   HGB 15.1 12/17/2022 0913   HCT 42.7 12/17/2022 0913   PLT 316 12/17/2022 0913   MCV 91.0 12/17/2022 0913   MCH 32.2 12/17/2022 0913   MCHC 35.4 12/17/2022 0913   RDW 11.8 12/17/2022 0913   LYMPHSABS 3,942 (H) 12/17/2022 0913   MONOABS 1.1 (H) 12/26/2021 0219   EOSABS 31 12/17/2022 0913   BASOSABS 62 12/17/2022 0913    BMET    Component Value Date/Time   NA 139 12/17/2022 0913   NA 141 03/23/2016 0803   K 4.3 12/17/2022 0913   CL  103 12/17/2022 0913   CO2 26 12/17/2022 0913   GLUCOSE 101 (H) 12/17/2022 0913   BUN 18 12/17/2022 0913   BUN 11 03/23/2016 0803   CREATININE 0.91 12/17/2022 0913   CALCIUM 9.6 12/17/2022 0913   GFRNONAA >60 12/24/2021 0857   GFRNONAA 87 10/24/2020 0824   GFRAA 101 10/24/2020 0824    BNP    Component Value Date/Time   BNP 27.5 12/06/2021 1109    ProBNP No results found for: "PROBNP"  Imaging: DG Chest 2 View  Result Date: 03/12/2023 CLINICAL DATA:  Cough for 3 weeks EXAM: CHEST - 2 VIEW COMPARISON:  01/30/2022 FINDINGS: The heart size and  mediastinal contours are within normal limits. Diffuse bilateral interstitial opacity and a small, chronic loculated appearing right pleural effusion, similar to prior examination. Osseous structures unremarkable. IMPRESSION: Diffuse bilateral interstitial opacity and a small, chronic loculated appearing right pleural effusion, similar to prior examination. Findings may reflect edema, infection, and/or chronic underlying interstitial change. Consider CT to further evaluate. No focal airspace opacity. Electronically Signed   By: Delanna Ahmadi M.D.   On: 03/12/2023 10:43     Assessment & Plan:   Chronic bronchitis with acute exacerbation ([place]) - Patient developed productive cough 3 weeks ago with associated nasal congestion and wheezing. Completed Zpack. He has scattered rhonchi on exam to upper lung fields bilaterally. Reports improvement after Albuterol nebulizer x1 given in office. Plan increase mucinex 1,200mg  twice daily, start flutter valve, sending in RX PRN albuterol Hfa and checking CXR. Holding off on additional abx. No steriods indicated at this time.      Martyn Ehrich, NP 03/18/2023

## 2023-03-12 NOTE — Progress Notes (Signed)
Please elt patient know CXR showed diffuse interstitial lung changes and small chronic appearing right pleural effusion which is similar to prior. No focal pneumonia.   Recommend he have HRCT of his chest to evaluate those Insterstitial changes, I will order

## 2023-03-12 NOTE — Assessment & Plan Note (Signed)
-   Patient developed productive cough 3 weeks ago with associated nasal congestion and wheezing. Completed Zpack. He has scattered rhonchi on exam to upper lung fields bilaterally. Reports improvement after Albuterol nebulizer x1 given in office. Plan increase mucinex 1,200mg  twice daily, start flutter valve, sending in RX PRN albuterol Hfa and checking CXR. Holding off on additional abx. No steriods indicated at this time.

## 2023-03-14 ENCOUNTER — Telehealth: Payer: Self-pay | Admitting: Primary Care

## 2023-03-14 DIAGNOSIS — J410 Simple chronic bronchitis: Secondary | ICD-10-CM

## 2023-03-14 NOTE — Telephone Encounter (Signed)
Pt. Calling back about getting a CT scan ordered which I don't see in the chart of one and he wanted to know when they would be calling him to set it up

## 2023-03-14 NOTE — Telephone Encounter (Signed)
Called patient but he did not answer. Beth had mentioned a HRCT in the CXR results but it was not ordered. I have placed the order.

## 2023-03-25 ENCOUNTER — Other Ambulatory Visit: Payer: Self-pay | Admitting: Nurse Practitioner

## 2023-03-25 ENCOUNTER — Ambulatory Visit (HOSPITAL_COMMUNITY)
Admission: RE | Admit: 2023-03-25 | Discharge: 2023-03-25 | Disposition: A | Discharge status: Home / Self Care | Payer: Medicare HMO | Source: Ambulatory Visit | Attending: Primary Care | Admitting: Primary Care

## 2023-03-25 ENCOUNTER — Encounter (HOSPITAL_COMMUNITY): Payer: Self-pay

## 2023-03-25 ENCOUNTER — Other Ambulatory Visit: Payer: Self-pay

## 2023-03-25 DIAGNOSIS — J479 Bronchiectasis, uncomplicated: Secondary | ICD-10-CM | POA: Insufficient documentation

## 2023-03-25 DIAGNOSIS — M1A9XX0 Chronic gout, unspecified, without tophus (tophi): Secondary | ICD-10-CM

## 2023-03-25 DIAGNOSIS — J410 Simple chronic bronchitis: Secondary | ICD-10-CM | POA: Insufficient documentation

## 2023-03-25 DIAGNOSIS — J984 Other disorders of lung: Secondary | ICD-10-CM | POA: Diagnosis not present

## 2023-03-25 DIAGNOSIS — I700 Atherosclerosis of aorta: Secondary | ICD-10-CM | POA: Insufficient documentation

## 2023-03-25 MED ORDER — FEBUXOSTAT 40 MG PO TABS: 40.0000 mg | ORAL_TABLET | Freq: Every day | ORAL | 0 refills | Status: DC

## 2023-03-25 NOTE — Telephone Encounter (Signed)
Patient DOES NOT want the Allopurinol.  Rx Canceled.

## 2023-03-25 NOTE — Telephone Encounter (Signed)
Patient request 15 tablets be called into [facility] on [address]. To cover until he receives mail order medication.

## 2023-03-25 NOTE — Telephone Encounter (Signed)
Pharmacy comment: Alternative Requested:THE PRESCRIBED MEDICATION IS NOT COVERED BY INSURANCE. PLEASE CONSIDER CHANGING TO ONE OF THE SUGGESTED COVERED ALTERNATIVES.

## 2023-03-25 NOTE — Telephone Encounter (Signed)
Received Request from Pharmacy:   Pharmacy comment: Alternative Requested:THE PRESCRIBED MEDICATION IS NOT COVERED BY INSURANCE. PLEASE CONSIDER CHANGING TO ONE OF THE SUGGESTED COVERED ALTERNATIVES.    Pended and sent to [facility] for Alternative.

## 2023-03-25 NOTE — Telephone Encounter (Signed)
Did he request a refill? Does he want [place] to send the allopurinol to his pharmacy?

## 2023-03-26 ENCOUNTER — Ambulatory Visit: Payer: Medicare HMO | Admitting: Primary Care

## 2023-03-26 ENCOUNTER — Encounter: Payer: Self-pay | Admitting: Primary Care

## 2023-03-26 VITALS — BP 118/86 | HR 76 | Ht 70.0 in | Wt 224.0 lb

## 2023-03-26 DIAGNOSIS — J410 Simple chronic bronchitis: Secondary | ICD-10-CM | POA: Diagnosis not present

## 2023-03-26 DIAGNOSIS — J90 Pleural effusion, not elsewhere classified: Secondary | ICD-10-CM

## 2023-03-26 DIAGNOSIS — J309 Allergic rhinitis, unspecified: Secondary | ICD-10-CM | POA: Diagnosis not present

## 2023-03-26 DIAGNOSIS — K449 Diaphragmatic hernia without obstruction or gangrene: Secondary | ICD-10-CM

## 2023-03-26 MED ORDER — ALBUTEROL SULFATE HFA 108 (90 BASE) MCG/ACT IN AERS: 2.0000 | INHALATION_SPRAY | Freq: Four times a day (QID) | RESPIRATORY_TRACT | 2 refills | Status: DC | PRN

## 2023-03-26 NOTE — Patient Instructions (Addendum)
CT of your chest showed findings of chronic bronchitis and scarring from either aspiration or prior infection.  There was no evidence of any pleural effusion (fluid in your lungs)  If you develop purulent mucus or fevers please notify office and I would recommend getting a sputum sample to restarting any antibiotics  Continue airway clearance with Mucinex and flutter valve daily. Use albuterol as needed for shortness of breath  Continue Zyrtec for postnasal drip and allergy symptoms daily  Work on weight loss and continue reflux medication for management for GERD/hiatal hernia   Follow aspiration precautions below   Recommendations: - Continue Zyrtec 10mg  daily (one tablet) - Continue mucinex 600mg  once daily - Continue flutter valve 2-3 times a day as needed for cough/chest congestion - Use Albuterol as needed for shortness of breath/wheezing   Rx: Albuterol (sent)   Follow-up: 3-4 months with Dr. Erin Fulling  Aspiration Precautions, Adult Aspiration is when a person breathes in (inhales) a liquid or other material, and it goes into the lungs. Adults who have conditions that affect their brain or spinal cord or who have trouble swallowing, a decreased gag reflex, or trouble moving around (mobility) are at risk for this condition. Things that can be inhaled into the lungs include: Food. Any type of liquid. This includes drinks and saliva. Stomach contents. This includes vomit and stomach acid. This condition can cause an infection in the lungs (pneumonia). Certain steps can be taken to reduce the risk of aspiration. What are the signs of aspiration? Signs may include: Coughing. A person may: Cough after they swallow food or liquids. Cough up mucus (sputum) that is yellow, tan, or green. It may also smell bad or have pieces of food in it. Cough when lying down or have to sit up quickly after lying down. Have a hoarse, barky cough. Trouble breathing. This may include: Breathing very  quickly or very slowly. Loud breathing. High-pitched whistling sounds during breathing, most often when breathing out (wheezing). Trouble eating. This may involve: Clearing the throat often while eating. Drooling while eating. Having a feeling of fullness in the throat or like something is stuck in the throat. Having a runny nose and watery eyes while eating. Speaking problems. This may include a hoarse voice or inability to speak. Choking often while eating. Changes in skin color. The skin may look red or blue. Pain in the chest or back during or right after eating. What problems can aspiration cause? This condition can cause problems such as: Losing weight because the person is not absorbing the nutrients they need. Loss of enjoyment and the social benefits of eating. Choking. Lung irritation. This can happen if someone aspirates acidic food or drinks. Pneumonia. Lung abscess. This is a collection of infected liquid (pus) in the lungs. In serious cases, death can occur. What can I do to prevent aspiration? Caring for someone who has a feeding tube If you are caring for someone who has a feeding tube and cannot eat or drink safely by mouth: Keep the person in an upright position as much as possible. Do not lay the person flat if they get feedings around the clock (continuous feedings). If you need to lay the person flat for any reason, turn the feeding pump off. Check for left over liquid (residuals) in the stomach as told by the health care provider. Ask the health care provider what residual amount is too high. Caring for someone who can eat and drink safely by mouth If you are caring  for someone who can eat and drink safely by mouth: Have the person sit in an upright position when they eat or drink. This can be done by: Having the person sit up in a chair. Positioning the person in bed so they are upright. This can be done if sitting in a chair is not possible. Remind the person  to eat slowly and chew well. Make sure the person is awake and alert while eating. Never put food or liquids in the mouth of a person who is not fully alert. Do not distract the person, especially if they have problems with thinking or memory (cognitive problems). Allow foods to cool. Hot foods may be harder to swallow. Provide small meals often rather than three large meals. This may help the person to not feel tired when eating. After the person is done eating: Check their mouth well for leftover food. Keep them sitting upright for 30-45 minutes. Do not serve food or drink within 2 hours of bedtime. General instructions To prevent aspiration in someone who can eat and drink safely by mouth: Feed small bites of food. Do not force-feed. If the person is on a diet because of problems with swallowing (dysphagia diet), follow the food and drink consistency that the health care provider recommends. You may be told to thicken a liquid using a thickening product. Use as little water as possible when brushing the person's teeth or cleaning their mouth. Provide oral care before and after meals. Use adaptive devices as told by the health care provider. These may include cut-out cups or other utensils. Crush pills and put them in soft food, such as pudding or ice cream. Some pills should not be crushed. Check with the health care provider before crushing any medicine. If the person is choking on food or an object, do abdominal thrusts.  Contact a health care provider if: The person has a feeding tube, and the feeding tube residual amount is too high. The person tries to avoid food or water. They may refuse to eat, drink, or be fed, or they may eat less than normal. The person may have aspirated food or liquid. The person shows warning signs of aspiration. These include choking or coughing when they eat or drink. The person has symptoms of pneumonia. These may include: Coughing a lot or coughing up  mucus with a bad smell or blood in it. A long-lasting (chronic) cough. Shortness of breath or wheezing. Chest or back pain. Sweating, fever, or chills. Get help right away if: The person has trouble breathing or starts to breathe fast. The person is breathing very slowly or stops breathing. The person cannot stop choking. The person turns blue, faints, or seems confused. These symptoms may be an emergency. Get help right away. Call 911. Do not wait to see if the symptoms will go away. This information is not intended to replace advice given to you by your health care provider. Make sure you discuss any questions you have with your health care provider. Document Revised: 05/30/2022 Document Reviewed: 05/30/2022 Elsevier Patient Education  [place].

## 2023-03-26 NOTE — Progress Notes (Addendum)
@Patient  ID: Rodney Foster, male    DOB: Feb 14, 1950, 73 y.o.   MRN: [id number]  Chief Complaint  Patient presents with   Follow-up    Breathing is getting better CT Chest 03/25/23    Referring provider: [name], NP  HPI: 73 year old male, former smoker quit in 1985.  Past medical history significant for hypertension, community-acquired pneumonia, pleural effusion, cough variant asthma, hyperlipidemia, gout.   Previous [facility] pulmonary encounter 03/12/2023 This is a 73 year old male, patient of Dr. Erin Fulling who was last seen in January 2023 for follow-up pneumonia and pleural effusion. He had chest tube and TPA/DNAse therapy for loculated right pleural effusion along with prolonged Augmentin course with significant improvement. 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 No further interventions needed, plan to follow-up as needed.  Patient presents today for acute visit. He has had a cough for three weeks with associated wheezing and nasal congestion. Sinus drainage. Cough is productive. He took zpack he had on hand.   Airview download 02/09/23-03/10/23 Usage 30/30 days; 97% > 4 hours Average usage 7 hours 18 mins Pressure 5-15cm h20 914.9cm h20-95%) Airleaks 5L/min (95%) AHI 14.3   Chronic bronchitis with acute exacerbation (HCC) - Patient developed productive cough 3 weeks ago with associated nasal congestion and wheezing. Completed Zpack. He has scattered rhonchi on exam to upper lung fields bilaterally. Reports improvement after Albuterol nebulizer x1 given in office. Plan increase mucinex 1,200mg  twice daily, start flutter valve, sending in RX PRN albuterol Hfa and checking CXR. Holding off on additional abx. No steriods indicated at this time.   03/26/2023- Interim hx  Patient presents today for 2 week follow-up/acute bronchitis.  Patient developed cough 3 weeks ago with associated nasal congestion and  wheezing. He completed zpack. He had scattered rhonchi on exam bilaterally. Reports improvement with albuterol nebulizer. Increased mucinex twice daily and started on flutter valve. No steroids or antibiotics yet. CXR on 03/12/23 showed diffuse bilateral interstitial opacity and small, chronic loculated appearing right pleural effusion similar to prior. Ordered for HRCT to evaluate interstitial changes.   He had a CT chest yesterday which showed moderate to severe bronchial wall thickening right greater than left lower lobes.  Right lower lobe bronchiectasis.  Numerous small nodules and groundglass opacities in bilateral lower lobes, findings felt to likely represent aspiration or infection. Interval resolution of right pleural effusion.  No pleural effusion or pneumothorax.  Cough is better. Does not botther him during the day, only occurs at night sometimes. Zyretc helped dry of post nasal drip symptoms. No significant purulence. He has been taking mucinex and using flutter valve. Occasional episodes of aspiration when speaking with mouth full of food. No breathing issues today.   No Known Allergies  Immunization History  Administered Date(s) Administered   COVID-19, mRNA, vaccine(Comirnaty)12 years and older 10/19/2022   Covid-19, Mrna,Vaccine(Spikevax)42yrs and older 10/19/2022   Fluad Quad(high Dose 65+) 08/20/2019, 10/28/2020, 10/09/2021, 10/19/2022   Influenza, High Dose Seasonal PF 10/17/2017, 09/05/2018   Influenza,inj,Quad PF,6+ Mos 12/02/2013, 03/07/2016, 10/10/2016   Influenza-Unspecified 12/05/2011   PFIZER(Purple Top)SARS-COV-2 Vaccination 01/22/2020, 02/12/2020, 09/14/2020, 04/18/2021   Pfizer Covid-19 Vaccine Bivalent Booster 54yrs & up 09/19/2021, 05/10/2022   Pneumococcal Conjugate-13 08/26/2017   Pneumococcal Polysaccharide-23 09/05/2018   Respiratory Syncytial Virus Vaccine,Recomb Aduvanted(Arexvy) 10/19/2022, 11/09/2022   Td 12/31/1994, 12/03/2013   Zoster Recombinat  (Shingrix) 04/15/2018, 08/04/2018    Past Medical History:  Diagnosis Date   Backache, unspecified  Bronchitis    Cough variant asthma 05/03/2017   Genital herpes    Gout    Hearing loss    Hemorrhoids    Hernia    Herpes simplex    Hyperglycemia    Hyperlipidemia    Hypertension    Impotence    Insomnia, unspecified    Osteoarthrosis, unspecified whether generalized or localized, unspecified site    Tinea pedis, right    Unspecified disorder of male genital organs     Tobacco History: Social History   Tobacco Use  Smoking Status Former   Packs/day: 1.00   Years: 15.00   Additional pack years: 0.00   Total pack years: 15.00   Types: Cigarettes   Quit date: 01/01/1984   Years since quitting: 39.2  Smokeless Tobacco Former   Quit date: 1990   Counseling given: Not Answered   Outpatient Medications Prior to Visit  Medication Sig Dispense Refill   acyclovir (ZOVIRAX) 400 MG tablet TAKE 1 TABLET BY MOUTH EVERY DAY 90 tablet 1   amLODipine (NORVASC) 5 MG tablet TAKE ONE TABLET BY MOUTH ONCE DAILY TO CONTROL BLOOD PRESSURE 90 tablet 3   atorvastatin (LIPITOR) 20 MG tablet TAKE 1 TABLET BY MOUTH EVERYDAY AT BEDTIME 90 tablet 3   febuxostat (ULORIC) 40 MG tablet Take 1 tablet (40 mg total) by mouth daily. 15 tablet 0   fluticasone (FLONASE) 50 MCG/ACT nasal spray Place 1 spray into both nostrils daily. 16 g 2   guaiFENesin (MUCINEX) 600 MG 12 hr tablet Take 600 mg by mouth 2 (two) times daily.     losartan (COZAAR) 100 MG tablet TAKE ONE BY MOUTH DAILY FOR BLOOD PRESSURE CONTROL 90 tablet 1   Multiple Vitamins-Minerals (ANTIOXIDANT PO) Take 30 mLs by mouth daily. Aronia Berry Juice     tadalafil (CIALIS) 20 MG tablet Take 1 tablet (20 mg total) by mouth daily as needed for erectile dysfunction. 4 tablet 3   terbinafine (LAMISIL) 250 MG tablet Please take one a day x 7days, repeat every 4 weeks x 4 months 28 tablet 0   zolpidem (AMBIEN) 5 MG tablet TAKE 1 TABLET BY MOUTH AT  BEDTIME AS NEEDED FOR SLEEP 30 tablet 3   No facility-administered medications prior to visit.   Review of Systems  Review of Systems  Constitutional: Negative.   HENT:  Positive for postnasal drip.   Respiratory:  Positive for cough. Negative for chest tightness, shortness of breath and wheezing.    Physical Exam  BP 118/86 (BP Location: Left Arm, Patient Position: Sitting, Cuff Size: Normal)   Pulse 76   Ht 5\' 10"  (1.778 m)   Wt 224 lb (101.6 kg)   SpO2 95%   BMI 32.14 kg/m  Physical Exam Constitutional:      General: He is not in acute distress.    Appearance: Normal appearance. He is not ill-appearing.  HENT:     Head: Normocephalic and atraumatic.     Mouth/Throat:     Mouth: Mucous membranes are moist.     Pharynx: Oropharynx is clear.  Cardiovascular:     Rate and Rhythm: Normal rate and regular rhythm.  Pulmonary:     Effort: Pulmonary effort is normal.     Breath sounds: Normal breath sounds. No wheezing, rhonchi or rales.  Musculoskeletal:        General: Normal range of motion.  Skin:    General: Skin is warm and dry.  Neurological:     General: No focal deficit present.  Mental Status: He is alert and oriented to person, place, and time. Mental status is at baseline.  Psychiatric:        Mood and Affect: Mood normal.        Behavior: Behavior normal.        Thought Content: Thought content normal.        Judgment: Judgment normal.      Lab Results:  CBC    Component Value Date/Time   WBC 10.4 12/17/2022 0913   RBC 4.69 12/17/2022 0913   HGB 15.1 12/17/2022 0913   HCT 42.7 12/17/2022 0913   PLT 316 12/17/2022 0913   MCV 91.0 12/17/2022 0913   MCH 32.2 12/17/2022 0913   MCHC 35.4 12/17/2022 0913   RDW 11.8 12/17/2022 0913   LYMPHSABS 3,942 (H) 12/17/2022 0913   MONOABS 1.1 (H) 12/26/2021 0219   EOSABS 31 12/17/2022 0913   BASOSABS 62 12/17/2022 0913    BMET    Component Value Date/Time   NA 139 12/17/2022 0913   NA 141 03/23/2016  0803   K 4.3 12/17/2022 0913   CL 103 12/17/2022 0913   CO2 26 12/17/2022 0913   GLUCOSE 101 (H) 12/17/2022 0913   BUN 18 12/17/2022 0913   BUN 11 03/23/2016 0803   CREATININE 0.91 12/17/2022 0913   CALCIUM 9.6 12/17/2022 0913   GFRNONAA >60 12/24/2021 0857   GFRNONAA 87 10/24/2020 0824   GFRAA 101 10/24/2020 0824    BNP    Component Value Date/Time   BNP 27.5 12/06/2021 1109    ProBNP No results found for: "PROBNP"  Imaging: CT Chest High Resolution  Result Date: 03/26/2023 CLINICAL DATA:  Cough for 2 weeks EXAM: CT CHEST WITHOUT CONTRAST TECHNIQUE: Multidetector CT imaging of the chest was performed following the standard protocol without intravenous contrast. High resolution imaging of the lungs, as well as inspiratory and expiratory imaging, was performed. RADIATION DOSE REDUCTION: This exam was performed according to the departmental dose-optimization program which includes automated exposure control, adjustment of the mA and/or kV according to patient size and/or use of iterative reconstruction technique. COMPARISON:  Chest CT dated December 25, 2021 FINDINGS: Cardiovascular: Normal heart size. No pericardial effusion. Normal caliber thoracic aorta with mild calcified plaque. Mild coronary artery calcifications. Mediastinum/Nodes: Small hiatal hernia. Thyroid is unremarkable. No enlarged lymph nodes seen in the chest. Lungs/Pleura: Moderate to severe bronchial wall thickening of the right-greater-than-left lower lobes. Right lower lobe bronchiectasis. Numerous small nodules and ground-glass opacities are seen in the bilateral lower lobes. Interval resolution of right pleural effusion. No pleural effusion or pneumothorax seen on today's exam. Upper Abdomen: No acute abnormality. Musculoskeletal: Unchanged moderate compression compression deformity of L1. Stable sclerotic lesion of T11. No chest wall mass or suspicious bone lesions identified. IMPRESSION: 1. Bronchial wall thickening of  the right-greater-than-left lower lobes associated with numerous small nodules and ground-glass opacities. Findings are likely due to aspiration or infection. 2. Right lower lobe bronchiectasis, likely sequela of prior infection. 3. Aortic Atherosclerosis (ICD10-I70.0). Electronically Signed   By: Yetta Glassman M.D.   On: 03/26/2023 09:40   DG Chest 2 View  Result Date: 03/12/2023 CLINICAL DATA:  Cough for 3 weeks EXAM: CHEST - 2 VIEW COMPARISON:  01/30/2022 FINDINGS: The heart size and mediastinal contours are within normal limits. Diffuse bilateral interstitial opacity and a small, chronic loculated appearing right pleural effusion, similar to prior examination. Osseous structures unremarkable. IMPRESSION: Diffuse bilateral interstitial opacity and a small, chronic loculated appearing right pleural effusion, similar  to prior examination. Findings may reflect edema, infection, and/or chronic underlying interstitial change. Consider CT to further evaluate. No focal airspace opacity. Electronically Signed   By: Delanna Ahmadi M.D.   On: 03/12/2023 10:43     Assessment & Plan:   Chronic bronchitis - Improved; Cough is better. HRCT chest 03/25/23 showed moderate to severe bronchial wall thickening right greater than left lower lobes.  Right lower lobe bronchiectasis.  Numerous small nodules and groundglass opacities in bilateral lower lobes, findings felt to likely represent aspiration or infection. Interval resolution of right pleural effusion. Advised patient he he develop any purulent mucus or fevers please notify office and I would recommend getting a sputum sample prior to restarting any antibiotics. Continue airway clearance with Mucinex and flutter valve daily. Use albuterol every 4-6 hours as needed for shortness of breath   Pleural effusion - Resolved  Allergic rhinitis - Improved; Continue Zyrtec 10mg  daily   Hiatal hernia - Working on weight loss; Reflux controlled on PPI     Recommendations: - Continue Zyrtec 10mg  daily (one tablet) - Continue mucinex 600mg  once daily - Continue flutter valve 2-3 times a day as needed for cough/chest congestion - Use Albuterol as needed for shortness of breath/wheezing   Rx: Albuterol (sent)   Follow-up: 3-4 months with Dr. Romero Liner, NP 04/01/2023

## 2023-04-01 DIAGNOSIS — K449 Diaphragmatic hernia without obstruction or gangrene: Secondary | ICD-10-CM | POA: Insufficient documentation

## 2023-04-01 DIAGNOSIS — J309 Allergic rhinitis, unspecified: Secondary | ICD-10-CM | POA: Insufficient documentation

## 2023-04-01 NOTE — Assessment & Plan Note (Addendum)
-   Improved; Cough is better. HRCT chest 03/25/23 showed moderate to severe bronchial wall thickening right greater than left lower lobes.  Right lower lobe bronchiectasis.  Numerous small nodules and groundglass opacities in bilateral lower lobes, findings felt to likely represent aspiration or infection. Interval resolution of right pleural effusion. Advised patient he he develop any purulent mucus or fevers please notify office and I would recommend getting a sputum sample prior to restarting any antibiotics. Continue airway clearance with Mucinex and flutter valve daily. Use albuterol every 4-6 hours as needed for shortness of breath

## 2023-04-01 NOTE — Assessment & Plan Note (Signed)
-   Working on weight loss; Reflux controlled on PPI

## 2023-04-01 NOTE — Assessment & Plan Note (Signed)
-   Improved; Continue Zyrtec 10mg  daily

## 2023-04-01 NOTE — Assessment & Plan Note (Signed)
Resolved

## 2023-04-05 NOTE — Telephone Encounter (Signed)
I tried calling him myself but could we try to reach out to him again and get the encounter closed please

## 2023-04-18 ENCOUNTER — Ambulatory Visit: Payer: Medicare HMO | Admitting: Podiatry

## 2023-05-09 DIAGNOSIS — M19011 Primary osteoarthritis, right shoulder: Secondary | ICD-10-CM | POA: Diagnosis not present

## 2023-05-14 DIAGNOSIS — Z961 Presence of intraocular lens: Secondary | ICD-10-CM | POA: Diagnosis not present

## 2023-05-14 DIAGNOSIS — H25011 Cortical age-related cataract, right eye: Secondary | ICD-10-CM | POA: Diagnosis not present

## 2023-05-14 DIAGNOSIS — H2511 Age-related nuclear cataract, right eye: Secondary | ICD-10-CM | POA: Diagnosis not present

## 2023-05-14 DIAGNOSIS — H18413 Arcus senilis, bilateral: Secondary | ICD-10-CM | POA: Diagnosis not present

## 2023-05-15 DIAGNOSIS — H903 Sensorineural hearing loss, bilateral: Secondary | ICD-10-CM | POA: Diagnosis not present

## 2023-05-20 DIAGNOSIS — H43391 Other vitreous opacities, right eye: Secondary | ICD-10-CM | POA: Diagnosis not present

## 2023-05-20 DIAGNOSIS — H25041 Posterior subcapsular polar age-related cataract, right eye: Secondary | ICD-10-CM | POA: Diagnosis not present

## 2023-05-20 DIAGNOSIS — H2511 Age-related nuclear cataract, right eye: Secondary | ICD-10-CM | POA: Diagnosis not present

## 2023-05-20 DIAGNOSIS — H25011 Cortical age-related cataract, right eye: Secondary | ICD-10-CM | POA: Diagnosis not present

## 2023-05-28 DIAGNOSIS — Z9889 Other specified postprocedural states: Secondary | ICD-10-CM | POA: Diagnosis not present

## 2023-06-10 ENCOUNTER — Other Ambulatory Visit: Payer: Self-pay | Admitting: Nurse Practitioner

## 2023-06-10 DIAGNOSIS — I10 Essential (primary) hypertension: Secondary | ICD-10-CM

## 2023-06-17 ENCOUNTER — Ambulatory Visit (INDEPENDENT_AMBULATORY_CARE_PROVIDER_SITE_OTHER): Payer: Medicare HMO | Admitting: Nurse Practitioner

## 2023-06-17 ENCOUNTER — Encounter: Payer: Self-pay | Admitting: Nurse Practitioner

## 2023-06-17 VITALS — BP 118/80 | HR 65 | Temp 97.9°F | Ht 70.0 in | Wt 204.4 lb

## 2023-06-17 DIAGNOSIS — J302 Other seasonal allergic rhinitis: Secondary | ICD-10-CM | POA: Diagnosis not present

## 2023-06-17 DIAGNOSIS — J410 Simple chronic bronchitis: Secondary | ICD-10-CM

## 2023-06-17 DIAGNOSIS — E663 Overweight: Secondary | ICD-10-CM

## 2023-06-17 DIAGNOSIS — R350 Frequency of micturition: Secondary | ICD-10-CM

## 2023-06-17 DIAGNOSIS — N529 Male erectile dysfunction, unspecified: Secondary | ICD-10-CM | POA: Diagnosis not present

## 2023-06-17 DIAGNOSIS — E782 Mixed hyperlipidemia: Secondary | ICD-10-CM

## 2023-06-17 DIAGNOSIS — N401 Enlarged prostate with lower urinary tract symptoms: Secondary | ICD-10-CM

## 2023-06-17 DIAGNOSIS — I10 Essential (primary) hypertension: Secondary | ICD-10-CM

## 2023-06-17 DIAGNOSIS — M1A9XX0 Chronic gout, unspecified, without tophus (tophi): Secondary | ICD-10-CM

## 2023-06-17 LAB — COMPLETE METABOLIC PANEL WITH GFR
ALT: 24 U/L (ref 9–46)
Alkaline phosphatase (APISO): 82 U/L (ref 35–144)
BUN: 16 mg/dL (ref 7–25)
CO2: 29 mmol/L (ref 20–32)
Chloride: 101 mmol/L (ref 98–110)
Globulin: 2.1 g/dL (calc) (ref 1.9–3.7)
Glucose, Bld: 92 mg/dL (ref 65–99)

## 2023-06-17 LAB — LIPID PANEL
Cholesterol: 160 mg/dL (ref <200)
HDL: 67 mg/dL (ref >=40)
LDL Cholesterol (Calc): 79 mg/dL (calc)
Total CHOL/HDL Ratio: 2.4 (calc) (ref <5.0)

## 2023-06-17 LAB — URIC ACID: Uric Acid, Serum: 3.5 mg/dL — ABNORMAL LOW (ref 4.0–8.0)

## 2023-06-17 MED ORDER — TAMSULOSIN HCL 0.4 MG PO CAPS
0.4000 mg | ORAL_CAPSULE | Freq: Every day | ORAL | 3 refills | Status: DC
Start: 2023-06-17 — End: 2023-07-26

## 2023-06-17 MED ORDER — TADALAFIL 20 MG PO TABS
20.0000 mg | ORAL_TABLET | Freq: Every day | ORAL | 3 refills | Status: AC | PRN
Start: 2023-06-17 — End: ?

## 2023-06-17 NOTE — Patient Instructions (Addendum)
Do not blow your nose.  Use a nettipot daily at bedtime Use plain saline through out the day.  Use Flonase into nose 1 spray in the morning and 1 in the evening into each nare.

## 2023-06-17 NOTE — Progress Notes (Signed)
Careteam: Patient Care Team: Sharon Seller, NP as PCP - General (Geriatric Medicine)  PLACE OF SERVICE:  [facility]  Advanced Directive information Does Patient Have a Medical Advance Directive?: Yes, Type of Advance Directive: Living will, Does patient want to make changes to medical advance directive?: No - Patient declined  No Known Allergies  Chief Complaint  Patient presents with   Medical Management of Chronic Issues    6 month follow-up and discuss need for colonoscopy(per patient he completed a cologuard kit). Discuss urinary issues, hard time trying to urinate, patient believes this is related to "Getting old."     HPI: Patient is a 73 y.o. male for routine follow up.   Gets up once at night to urinate but feels like he has a slow flow- was on medication for this in the past but did not continue- would like to retry at this time.   He has a lot of nasal congestion.  Needs refill for cialis.   Htn- well controlled on losartan and norvasc  Hyperlipidemia- on lipitor 20 mg daily   Has lost 20 lbs, feels much better. Doing monjauro injection through a nurse who has an office in [place].   Review of Systems:  Review of Systems  Constitutional:  Negative for chills, fever and weight loss.  HENT:  Negative for tinnitus.   Respiratory:  Negative for cough, sputum production and shortness of breath.   Cardiovascular:  Negative for chest pain, palpitations and leg swelling.  Gastrointestinal:  Negative for abdominal pain, constipation, diarrhea and heartburn.  Genitourinary:  Negative for dysuria, frequency and urgency.       Weak stream  Musculoskeletal:  Negative for back pain, falls, joint pain and myalgias.  Skin: Negative.   Neurological:  Negative for dizziness and headaches.  Endo/Heme/Allergies:  Positive for environmental allergies.  Psychiatric/Behavioral:  Negative for depression and memory loss. The patient does not have insomnia.     Past Medical  History:  Diagnosis Date   Backache, unspecified    Bronchitis    Cough variant asthma 05/03/2017   Genital herpes    Gout    Hearing loss    Hemorrhoids    Hernia    Herpes simplex    Hyperglycemia    Hyperlipidemia    Hypertension    Impotence    Insomnia, unspecified    Osteoarthrosis, unspecified whether generalized or localized, unspecified site    Tinea pedis, right    Unspecified disorder of male genital organs    Past Surgical History:  Procedure Laterality Date   fracture left femur  1984   ganglion repair of left forarm  2000   IVP  76   ML old comp fracture   KNEE SURGERY Right 01/21/2019   Delbert Harness    NASAL SEPTUM SURGERY  1997   TONSILLECTOMY  1959   TONSILLECTOMY     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   Procedure: TOTAL KNEE ARTHROPLASTY;  Surgeon: Frederico Hamman, MD;  Location: [facility];  Service: Orthopedics;  Laterality: Right;   TOTAL KNEE ARTHROPLASTY Left 05/06/2020   Procedure: TOTAL KNEE ARTHROPLASTY;  Surgeon: Frederico Hamman, MD;  Location: [facility];  Service: Orthopedics;  Laterality: Left;   UMBILICAL HERNIA REPAIR  2002   Social History:   reports that he quit smoking about 39 years ago. His smoking use included cigarettes. He has a 15.00 pack-year smoking history. He quit smokeless tobacco use about 34 years ago. He reports that he does  not currently use alcohol. He reports that he does not use drugs.  Family History  Problem Relation Age of Onset   COPD Mother    Aortic aneurysm Father     Medications: Patient's Medications  New Prescriptions   No medications on file  Previous Medications   ACYCLOVIR (ZOVIRAX) 400 MG TABLET    TAKE 1 TABLET BY MOUTH EVERY DAY   AMLODIPINE (NORVASC) 5 MG TABLET    TAKE ONE TABLET BY MOUTH ONCE DAILY TO CONTROL BLOOD PRESSURE   ATORVASTATIN (LIPITOR) 20 MG TABLET    TAKE 1 TABLET BY MOUTH EVERYDAY AT BEDTIME   FEBUXOSTAT (ULORIC) 40 MG TABLET    Take 1 tablet (40 mg total) by mouth daily.    FLUTICASONE (FLONASE) 50 MCG/ACT NASAL SPRAY    Place 1 spray into both nostrils daily.   GUAIFENESIN (MUCINEX) 600 MG 12 HR TABLET    Take 600 mg by mouth 2 (two) times daily.   LOSARTAN (COZAAR) 100 MG TABLET    TAKE ONE BY MOUTH DAILY FOR BLOOD PRESSURE CONTROL   MULTIPLE VITAMINS-MINERALS (ANTIOXIDANT PO)    Take 30 mLs by mouth daily. Aronia Berry Juice   TADALAFIL (CIALIS) 20 MG TABLET    Take 1 tablet (20 mg total) by mouth daily as needed for erectile dysfunction.  Modified Medications   No medications on file  Discontinued Medications   ALBUTEROL (VENTOLIN HFA) 108 (90 BASE) MCG/ACT INHALER    Inhale 2 puffs into the lungs every 6 (six) hours as needed for wheezing or shortness of breath.   TERBINAFINE (LAMISIL) 250 MG TABLET    Please take one a day x 7days, repeat every 4 weeks x 4 months   ZOLPIDEM (AMBIEN) 5 MG TABLET    TAKE 1 TABLET BY MOUTH AT BEDTIME AS NEEDED FOR SLEEP    Physical Exam:  Vitals:   06/17/23 0907  BP: 118/80  Pulse: 65  Temp: 97.9 F (36.6 C)  TempSrc: Temporal  SpO2: 96%  Weight: 204 lb 6.4 oz (92.7 kg)  Height: 5\' 10"  (1.778 m)   Body mass index is 29.33 kg/m. Wt Readings from Last 3 Encounters:  06/17/23 204 lb 6.4 oz (92.7 kg)  03/26/23 224 lb (101.6 kg)  03/12/23 226 lb (102.5 kg)    Physical Exam Constitutional:      General: He is not in acute distress.    Appearance: He is well-developed. He is not diaphoretic.  HENT:     Head: Normocephalic and atraumatic.     Right Ear: External ear normal.     Left Ear: External ear normal.     Mouth/Throat:     Pharynx: No oropharyngeal exudate.  Eyes:     Conjunctiva/sclera: Conjunctivae normal.     Pupils: Pupils are equal, round, and reactive to light.  Cardiovascular:     Rate and Rhythm: Normal rate and regular rhythm.     Heart sounds: Normal heart sounds.  Pulmonary:     Effort: Pulmonary effort is normal.     Breath sounds: Normal breath sounds.  Abdominal:     General: Bowel  sounds are normal.     Palpations: Abdomen is soft.  Musculoskeletal:        General: No tenderness.     Cervical back: Normal range of motion and neck supple.     Right lower leg: No edema.     Left lower leg: No edema.  Skin:    General: Skin is warm and dry.  Neurological:     Mental Status: He is alert and oriented to person, place, and time.     Labs reviewed: Basic Metabolic Panel: Recent Labs    12/17/22 0913  NA 139  K 4.3  CL 103  CO2 26  GLUCOSE 101*  BUN 18  CREATININE 0.91  CALCIUM 9.6   Liver Function Tests: Recent Labs    12/17/22 0913  AST 15  ALT 16  BILITOT 0.4  PROT 7.1   No results for input(s): "LIPASE", "AMYLASE" in the last 8760 hours. No results for input(s): "AMMONIA" in the last 8760 hours. CBC: Recent Labs    12/17/22 0913  WBC 10.4  NEUTROABS 5,450  HGB 15.1  HCT 42.7  MCV 91.0  PLT 316   Lipid Panel: Recent Labs    12/17/22 0913  CHOL 194  HDL 58  LDLCALC 118*  TRIG 82  CHOLHDL 3.3   TSH: No results for input(s): "TSH" in the last 8760 hours. A1C: Lab Results  Component Value Date   HGBA1C 5.5 01/20/2019     Assessment/Plan 1. Chronic gout without tophus, unspecified cause, unspecified site No recent flares, continue current regimen.  - Uric Acid  2. Mixed hyperlipidemia Continues on Lipitor daily with dietary modifications.  - Lipid panel - COMPLETE METABOLIC PANEL WITH GFR  3. Essential hypertension -Blood pressure well controlled, goal bp <140/90 Continue current medications and dietary modifications follow metabolic panel - COMPLETE METABOLIC PANEL WITH GFR - CBC with Differential/Platelet  4. Benign prostatic hyperplasia with urinary frequency - PSA - tamsulosin (FLOMAX) 0.4 MG CAPS capsule; Take 1 capsule (0.4 mg total) by mouth daily.  Dispense: 30 capsule; Refill: 3  5. Simple chronic bronchitis (HCC) Stable at this time.   6. Seasonal allergies To take Loratadine or cetrizine (generic  for Claritin or zyrtec) 10 mg by mouth daily for allergies.  -Do not blow your nose forcefully   Use a nettipot daily at bedtime Use plain saline through out the day.  Use Flonase into nose 1 spray in the morning and 1 in the evening into each nare.    7. Overweight (BMI 25.0-29.9) --education provided on healthy weight loss through increase in physical activity and proper nutrition   8. Impotence - tadalafil (CIALIS) 20 MG tablet; Take 1 tablet (20 mg total) by mouth daily as needed for erectile dysfunction.  Dispense: 4 tablet; Refill: 3   Return in about 6 months (around 12/17/2023) for routine follow up .  Janene Harvey. Biagio Borg [facility] [phone]

## 2023-06-18 DIAGNOSIS — H33011 Retinal detachment with single break, right eye: Secondary | ICD-10-CM | POA: Diagnosis not present

## 2023-06-18 DIAGNOSIS — Z9889 Other specified postprocedural states: Secondary | ICD-10-CM | POA: Diagnosis not present

## 2023-06-18 DIAGNOSIS — H43391 Other vitreous opacities, right eye: Secondary | ICD-10-CM | POA: Diagnosis not present

## 2023-06-18 LAB — CBC WITH DIFFERENTIAL/PLATELET
Absolute Monocytes: 836 cells/uL (ref 200–950)
Basophils Absolute: 61 cells/uL (ref 0–200)
Basophils Relative: 0.8 %
Eosinophils Absolute: 91 cells/uL (ref 15–500)
Eosinophils Relative: 1.2 %
HCT: 44 % (ref 38.5–50.0)
Hemoglobin: 15 g/dL (ref 13.2–17.1)
Lymphs Abs: 3139 cells/uL (ref 850–3900)
MCH: 32.4 pg (ref 27.0–33.0)
MCHC: 34.1 g/dL (ref 32.0–36.0)
MCV: 95 fL (ref 80.0–100.0)
MPV: 9.8 fL (ref 7.5–12.5)
Monocytes Relative: 11 %
Neutro Abs: 3473 cells/uL (ref 1500–7800)
Neutrophils Relative %: 45.7 %
Platelets: 322 10*3/uL (ref 140–400)
RBC: 4.63 10*6/uL (ref 4.20–5.80)
RDW: 12 % (ref 11.0–15.0)
Total Lymphocyte: 41.3 %
WBC: 7.6 10*3/uL (ref 3.8–10.8)

## 2023-06-18 LAB — COMPLETE METABOLIC PANEL WITH GFR
AG Ratio: 2.2 (calc) (ref 1.0–2.5)
AST: 19 U/L (ref 10–35)
Albumin: 4.6 g/dL (ref 3.6–5.1)
Calcium: 9.7 mg/dL (ref 8.6–10.3)
Creat: 0.96 mg/dL (ref 0.70–1.28)
Potassium: 4.6 mmol/L (ref 3.5–5.3)
Sodium: 137 mmol/L (ref 135–146)
Total Bilirubin: 0.6 mg/dL (ref 0.2–1.2)
Total Protein: 6.7 g/dL (ref 6.1–8.1)
eGFR: 83 mL/min/{1.73_m2} (ref >=60)

## 2023-06-18 LAB — LIPID PANEL
Non-HDL Cholesterol (Calc): 93 mg/dL (calc) (ref <130)
Triglycerides: 67 mg/dL (ref <150)

## 2023-06-18 LAB — PSA: PSA: 2.32 ng/mL (ref <=4.00)

## 2023-06-20 DIAGNOSIS — H33021 Retinal detachment with multiple breaks, right eye: Secondary | ICD-10-CM | POA: Diagnosis not present

## 2023-06-20 DIAGNOSIS — H35411 Lattice degeneration of retina, right eye: Secondary | ICD-10-CM | POA: Diagnosis not present

## 2023-07-19 DIAGNOSIS — Z9889 Other specified postprocedural states: Secondary | ICD-10-CM | POA: Diagnosis not present

## 2023-07-19 DIAGNOSIS — H33011 Retinal detachment with single break, right eye: Secondary | ICD-10-CM | POA: Diagnosis not present

## 2023-07-22 ENCOUNTER — Other Ambulatory Visit: Payer: Self-pay | Admitting: *Deleted

## 2023-07-22 DIAGNOSIS — M1A9XX0 Chronic gout, unspecified, without tophus (tophi): Secondary | ICD-10-CM

## 2023-07-22 MED ORDER — FEBUXOSTAT 40 MG PO TABS
40.0000 mg | ORAL_TABLET | Freq: Every day | ORAL | 1 refills | Status: DC
Start: 2023-07-22 — End: 2024-05-12

## 2023-07-22 NOTE — Telephone Encounter (Signed)
[facility] requested refill.  Printed Rx and placed in Jessica's folder to review and sign.   To be faxed back to [facility] Fax: [phone]

## 2023-07-24 ENCOUNTER — Other Ambulatory Visit: Payer: Self-pay | Admitting: Nurse Practitioner

## 2023-07-24 DIAGNOSIS — E782 Mixed hyperlipidemia: Secondary | ICD-10-CM

## 2023-07-25 NOTE — Progress Notes (Signed)
07/26/23- 55 yoM former smoker for sleep evaluation with hx OSA. Followed by Dr Francine Graven for chronic bronchitis, Asthma, pleural effusion. LOV 3/26/24Clent Ridges, NP acute exacerbation bronchitis No routine bronchodilators HST 11/23/18- AHI 59.6/ hr, desaturation to 55%(average 85%), body weight 214 lbs CPAP auto 5-15/ [employer]  AirSense 10 Autoset Download compliance 97%, AHI 7.2   (pressure range 10.7-14.7) Body weight today 199 lbs -----Pt is well. No issues with cpap machine Download reviewed. He accepts advice that we change pressure range to  5-20. "Loves" his machine. He reports that breathing is doing well since he saw Buelah Manis in March. HRCT 03/25/23- IMPRESSION: 1. Bronchial wall thickening of the right-greater-than-left lower lobes associated with numerous small nodules and ground-glass opacities. Findings are likely due to aspiration or infection. 2. Right lower lobe bronchiectasis, likely sequela of prior infection. 3. Aortic Atherosclerosis (ICD10-I70.0).  ROS-see HPI   + = positive Constitutional:    weight loss, night sweats, fevers, chills, fatigue, lassitude. HEENT:    headaches, difficulty swallowing, tooth/dental problems, sore throat,       sneezing, itching, ear ache, nasal congestion, post nasal drip, snoring CV:    chest pain, orthopnea, PND, swelling in lower extremities, anasarca,                 dizziness, palpitations Resp:   shortness of breath with exertion or at rest.                productive cough,   non-productive cough, coughing up of blood.              change in color of mucus.  wheezing.   Skin:    rash or lesions. GI:  No-   heartburn, indigestion, abdominal pain, nausea, vomiting, diarrhea,                 change in bowel habits, loss of appetite GU: dysuria, change in color of urine, no urgency or frequency.   flank pain. MS:   joint pain, stiffness, decreased range of motion, back pain. Neuro-     nothing unusual Psych:  change in mood or affect.   depression or anxiety.   memory loss.  OBJ- Physical Exam General- Alert, Oriented, Affect-appropriate, Distress- none acute Skin- rash-none, lesions- none, excoriation- none Lymphadenopathy- none Head- atraumatic            Eyes- Gross vision intact, PERRLA, conjunctivae and secretions clear            Ears- Hearing, canals-normal            Nose- Clear, no-Septal dev, mucus, polyps, erosion, perforation             Throat- Mallampati II , mucosa clear , drainage- none, tonsils- atrophic Neck- flexible , trachea midline, no stridor , thyroid nl, carotid no bruit Chest - symmetrical excursion , unlabored           Heart/CV- RRR , no murmur , no gallop  , no rub, nl s1 s2                           - JVD- none , edema- none, stasis changes- none, varices- none           Lung- +clear/ distant, wheeze- none, cough- none , dullness-none, rub- none           Chest wall-  Abd-  Br/ Gen/ Rectal- Not done, not indicated Extrem- cyanosis- none, clubbing,  none, atrophy- none, strength- nl Neuro- grossly intact to observation

## 2023-07-26 ENCOUNTER — Ambulatory Visit: Payer: Medicare HMO | Admitting: Internal Medicine

## 2023-07-26 ENCOUNTER — Encounter: Payer: Self-pay | Admitting: Internal Medicine

## 2023-07-26 VITALS — BP 128/60 | HR 69 | Ht 71.0 in | Wt 199.2 lb

## 2023-07-26 DIAGNOSIS — J410 Simple chronic bronchitis: Secondary | ICD-10-CM | POA: Diagnosis not present

## 2023-07-26 DIAGNOSIS — G4733 Obstructive sleep apnea (adult) (pediatric): Secondary | ICD-10-CM

## 2023-07-26 NOTE — Patient Instructions (Signed)
Order- DME [employer] please change CPAP auto range to 5-20, continue mask of choice, humidifier, supplies, AirView/ card  Please call if we can help

## 2023-07-26 NOTE — Assessment & Plan Note (Signed)
Benefits from CPAP with good compliance. We can improve control by raising the upper limit of autopap range. Plan- change autopap to 5-20

## 2023-07-26 NOTE — Assessment & Plan Note (Signed)
He feels much better than at March visit. Note on diagnostic sleep study in 2019, average O2 sat only 85%. Plan- suggest ONOX on CPAP to check status of nocturnal hypoxemia

## 2023-07-28 ENCOUNTER — Other Ambulatory Visit: Payer: Self-pay | Admitting: Nurse Practitioner

## 2023-08-16 DIAGNOSIS — Z9889 Other specified postprocedural states: Secondary | ICD-10-CM | POA: Diagnosis not present

## 2023-08-16 DIAGNOSIS — H33011 Retinal detachment with single break, right eye: Secondary | ICD-10-CM | POA: Diagnosis not present

## 2023-09-10 DIAGNOSIS — M25512 Pain in left shoulder: Secondary | ICD-10-CM | POA: Diagnosis not present

## 2023-09-10 DIAGNOSIS — M545 Low back pain, unspecified: Secondary | ICD-10-CM | POA: Diagnosis not present

## 2023-09-10 DIAGNOSIS — M19011 Primary osteoarthritis, right shoulder: Secondary | ICD-10-CM | POA: Diagnosis not present

## 2023-09-23 ENCOUNTER — Other Ambulatory Visit: Payer: Self-pay | Admitting: Nurse Practitioner

## 2023-09-23 DIAGNOSIS — N401 Enlarged prostate with lower urinary tract symptoms: Secondary | ICD-10-CM

## 2023-10-16 DIAGNOSIS — H59813 Chorioretinal scars after surgery for detachment, bilateral: Secondary | ICD-10-CM | POA: Diagnosis not present

## 2023-10-29 ENCOUNTER — Telehealth: Payer: Medicare HMO

## 2023-10-29 NOTE — Telephone Encounter (Signed)
Patient called stating he needs some syringes for his wegovy injections. I do not see wegovy on patients medication list and he states Janyth Contes, Janene Harvey, NP is aware that he is taking. Patient states the wegovy that he has is in a vial. Patient started medication in May and would like to use what he has left.   Please advise

## 2023-10-29 NOTE — Telephone Encounter (Signed)
He reported he was doing monjauro injections through a nurse in [place], would recommend reaching out to them to get supplies

## 2023-10-30 NOTE — Telephone Encounter (Signed)
Mychart message sent to patient with providers response.

## 2023-12-10 ENCOUNTER — Encounter: Payer: Medicare HMO | Admitting: Nurse Practitioner

## 2023-12-13 ENCOUNTER — Encounter: Payer: Medicare HMO | Admitting: Nurse Practitioner

## 2023-12-16 ENCOUNTER — Telehealth: Payer: Self-pay | Admitting: Internal Medicine

## 2023-12-16 ENCOUNTER — Ambulatory Visit (INDEPENDENT_AMBULATORY_CARE_PROVIDER_SITE_OTHER): Payer: Medicare HMO | Admitting: Nurse Practitioner

## 2023-12-16 ENCOUNTER — Encounter: Payer: Self-pay | Admitting: Nurse Practitioner

## 2023-12-16 VITALS — BP 132/72 | HR 70 | Temp 97.8°F | Resp 20 | Ht 71.0 in | Wt 198.0 lb

## 2023-12-16 DIAGNOSIS — N401 Enlarged prostate with lower urinary tract symptoms: Secondary | ICD-10-CM

## 2023-12-16 DIAGNOSIS — I10 Essential (primary) hypertension: Secondary | ICD-10-CM

## 2023-12-16 DIAGNOSIS — M1A9XX0 Chronic gout, unspecified, without tophus (tophi): Secondary | ICD-10-CM

## 2023-12-16 DIAGNOSIS — J410 Simple chronic bronchitis: Secondary | ICD-10-CM | POA: Diagnosis not present

## 2023-12-16 DIAGNOSIS — E782 Mixed hyperlipidemia: Secondary | ICD-10-CM | POA: Diagnosis not present

## 2023-12-16 DIAGNOSIS — G4733 Obstructive sleep apnea (adult) (pediatric): Secondary | ICD-10-CM | POA: Diagnosis not present

## 2023-12-16 DIAGNOSIS — D692 Other nonthrombocytopenic purpura: Secondary | ICD-10-CM

## 2023-12-16 DIAGNOSIS — R350 Frequency of micturition: Secondary | ICD-10-CM | POA: Diagnosis not present

## 2023-12-16 MED ORDER — TAMSULOSIN HCL 0.4 MG PO CAPS
0.4000 mg | ORAL_CAPSULE | Freq: Every day | ORAL | 3 refills | Status: DC
Start: 2023-12-16 — End: 2024-10-14

## 2023-12-16 NOTE — Patient Instructions (Addendum)
[facility] Phone: [phone] for CPAP

## 2023-12-16 NOTE — Progress Notes (Signed)
Careteam: Patient Care Team: Sharon Seller, NP as PCP - General (Geriatric Medicine)  PLACE OF SERVICE:  [facility]  Advanced Directive information    No Known Allergies  Chief Complaint  Patient presents with   Medical Management of Chronic Issues    Patient presents today for a 6 month follow-up   Quality Metric Gaps    AWV, TDAP     HPI: Patient is a 73 y.o. male for routine follow up.   In the last 6 month has noticed he bruises more easily No abnormal bleeding.  Turned 73 on Dec 8[birth year].   No recent gout flares- continues on uloric daily  Continues to have sinus drainage and then will get cough which is chronic- no flares   AWV is set up for later this week.   OSA_ on CPAP  Lost 30 lbs and now maintaining- feeling a lot better since he has lost weight   Review of Systems:  Review of Systems  Constitutional:  Negative for chills, fever and weight loss.  HENT:  Negative for tinnitus.   Respiratory:  Negative for cough, sputum production and shortness of breath.   Cardiovascular:  Negative for chest pain, palpitations and leg swelling.  Gastrointestinal:  Negative for abdominal pain, constipation, diarrhea and heartburn.  Genitourinary:  Negative for dysuria, frequency and urgency.  Musculoskeletal:  Negative for back pain, falls, joint pain and myalgias.  Skin: Negative.   Neurological:  Negative for dizziness and headaches.  Psychiatric/Behavioral:  Negative for depression and memory loss. The patient does not have insomnia.     Past Medical History:  Diagnosis Date   Backache, unspecified    Bronchitis    Cough variant asthma 05/03/2017   Genital herpes    Gout    Hearing loss    Hemorrhoids    Hernia    Herpes simplex    Hyperglycemia    Hyperlipidemia    Hypertension    Impotence    Insomnia, unspecified    Osteoarthrosis, unspecified whether generalized or localized, unspecified site    Tinea pedis, right    Unspecified disorder of male  genital organs    Past Surgical History:  Procedure Laterality Date   fracture left femur  1984   ganglion repair of left forarm  2000   IVP  37   ML old comp fracture   KNEE SURGERY Right 01/21/2019   [facility] Harness    NASAL SEPTUM SURGERY  1997   TONSILLECTOMY  1959   TONSILLECTOMY     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   Procedure: TOTAL KNEE ARTHROPLASTY;  Surgeon: Frederico Hamman, MD;  Location: [facility];  Service: Orthopedics;  Laterality: Right;   TOTAL KNEE ARTHROPLASTY Left 05/06/2020   Procedure: TOTAL KNEE ARTHROPLASTY;  Surgeon: Frederico Hamman, MD;  Location: [facility];  Service: Orthopedics;  Laterality: Left;   UMBILICAL HERNIA REPAIR  2002   Social History:   reports that he quit smoking about 39 years ago. His smoking use included cigarettes. He started smoking about 54 years ago. He has a 15 pack-year smoking history. He quit smokeless tobacco use about 34 years ago. He reports that he does not currently use alcohol. He reports that he does not use drugs.  Family History  Problem Relation Age of Onset   COPD Mother    Aortic aneurysm Father     Medications: Patient's Medications  New Prescriptions   No medications on file  Previous Medications   ACYCLOVIR (ZOVIRAX)  400 MG TABLET    TAKE 1 TABLET BY MOUTH EVERY DAY   AMLODIPINE (NORVASC) 5 MG TABLET    TAKE ONE TABLET BY MOUTH ONCE DAILY TO CONTROL BLOOD PRESSURE   ATORVASTATIN (LIPITOR) 20 MG TABLET    TAKE 1 TABLET BY MOUTH EVERYDAY AT BEDTIME   FEBUXOSTAT (ULORIC) 40 MG TABLET    Take 1 tablet (40 mg total) by mouth daily.   GUAIFENESIN (MUCINEX) 600 MG 12 HR TABLET    Take 600 mg by mouth 2 (two) times daily.   LOSARTAN (COZAAR) 100 MG TABLET    TAKE ONE BY MOUTH DAILY FOR BLOOD PRESSURE CONTROL   MULTIPLE VITAMINS-MINERALS (ANTIOXIDANT PO)    Take 30 mLs by mouth daily. Aronia Berry Juice   TADALAFIL (CIALIS) 20 MG TABLET    Take 1 tablet (20 mg total) by mouth daily as needed for erectile dysfunction.   Modified Medications   No medications on file  Discontinued Medications   No medications on file    Physical Exam:  Vitals:   12/16/23 1000  BP: 132/72  Pulse: 70  Resp: 20  Temp: 97.8 F (36.6 C)  SpO2: 98%  Weight: 198 lb (89.8 kg)  Height: 5\' 11"  (1.803 m)   Body mass index is 27.62 kg/m. Wt Readings from Last 3 Encounters:  12/16/23 198 lb (89.8 kg)  07/26/23 199 lb 3.2 oz (90.4 kg)  06/17/23 204 lb 6.4 oz (92.7 kg)    Physical Exam Constitutional:      General: He is not in acute distress.    Appearance: He is well-developed. He is not diaphoretic.  HENT:     Head: Normocephalic and atraumatic.     Right Ear: External ear normal.     Left Ear: External ear normal.     Mouth/Throat:     Pharynx: No oropharyngeal exudate.  Eyes:     Conjunctiva/sclera: Conjunctivae normal.     Pupils: Pupils are equal, round, and reactive to light.  Cardiovascular:     Rate and Rhythm: Normal rate and regular rhythm.     Heart sounds: Normal heart sounds.  Pulmonary:     Effort: Pulmonary effort is normal.     Breath sounds: Normal breath sounds.  Abdominal:     General: Bowel sounds are normal.     Palpations: Abdomen is soft.  Musculoskeletal:        General: No tenderness.     Cervical back: Normal range of motion and neck supple.     Right lower leg: No edema.     Left lower leg: No edema.  Skin:    General: Skin is warm and dry.  Neurological:     Mental Status: He is alert and oriented to person, place, and time.     Labs reviewed: Basic Metabolic Panel: Recent Labs    12/17/22 0913 06/17/23 1035  NA 139 137  K 4.3 4.6  CL 103 101  CO2 26 29  GLUCOSE 101* 92  BUN 18 16  CREATININE 0.91 0.96  CALCIUM 9.6 9.7   Liver Function Tests: Recent Labs    12/17/22 0913 06/17/23 1035  AST 15 19  ALT 16 24  BILITOT 0.4 0.6  PROT 7.1 6.7   No results for input(s): "LIPASE", "AMYLASE" in the last 8760 hours. No results for input(s): "AMMONIA" in the  last 8760 hours. CBC: Recent Labs    12/17/22 0913 06/17/23 1035  WBC 10.4 7.6  NEUTROABS 5,450 3,473  HGB 15.1  15.0  HCT 42.7 44.0  MCV 91.0 95.0  PLT 316 322   Lipid Panel: Recent Labs    12/17/22 0913 06/17/23 1035  CHOL 194 160  HDL 58 67  LDLCALC 118* 79  TRIG 82 67  CHOLHDL 3.3 2.4   TSH: No results for input(s): "TSH" in the last 8760 hours. A1C: Lab Results  Component Value Date   HGBA1C 5.5 01/20/2019     Assessment/Plan 1. Mixed hyperlipidemia (Primary) -controlled on lipitor and dietary modifications  2. Essential hypertension -Blood pressure well controlled, goal bp <140/90 Continue current medications and dietary modifications follow metabolic panel - COMPLETE METABOLIC PANEL WITH GFR - CBC with Differential/Platelet  3. Chronic gout without tophus, unspecified cause, unspecified site -no recent flares, continues on uloric  4. Simple chronic bronchitis (HCC) Stable, continues on mucinex   5. Senile purpura (HCC) Will montior  6. Benign prostatic hyperplasia with urinary frequency Out of medication, symptoms remain stable but will refill at this time - tamsulosin (FLOMAX) 0.4 MG CAPS capsule; Take 1 capsule (0.4 mg total) by mouth daily.  Dispense: 90 capsule; Refill: 3  7. OSA (obstructive sleep apnea) Continues on CPAP and doing well.   .  Return in about 6 months (around 06/15/2024) for routine follow up.  Janene Harvey. Biagio Borg [facility] [phone]

## 2023-12-17 LAB — COMPLETE METABOLIC PANEL WITH GFR
AG Ratio: 1.9 (calc) (ref 1.0–2.5)
ALT: 28 U/L (ref 9–46)
AST: 20 U/L (ref 10–35)
Albumin: 4.5 g/dL (ref 3.6–5.1)
Alkaline phosphatase (APISO): 79 U/L (ref 35–144)
BUN: 14 mg/dL (ref 7–25)
CO2: 24 mmol/L (ref 20–32)
Calcium: 9.3 mg/dL (ref 8.6–10.3)
Chloride: 101 mmol/L (ref 98–110)
Creat: 0.7 mg/dL (ref 0.70–1.28)
Globulin: 2.4 g/dL (ref 1.9–3.7)
Glucose, Bld: 89 mg/dL (ref 65–99)
Potassium: 4.6 mmol/L (ref 3.5–5.3)
Sodium: 135 mmol/L (ref 135–146)
Total Bilirubin: 0.5 mg/dL (ref 0.2–1.2)
Total Protein: 6.9 g/dL (ref 6.1–8.1)
eGFR: 96 mL/min/{1.73_m2} (ref >=60)

## 2023-12-17 LAB — CBC WITH DIFFERENTIAL/PLATELET
Absolute Lymphocytes: 2265 {cells}/uL (ref 850–3900)
Absolute Monocytes: 898 {cells}/uL (ref 200–950)
Basophils Absolute: 80 {cells}/uL (ref 0–200)
Basophils Relative: 1.2 %
Eosinophils Absolute: 67 {cells}/uL (ref 15–500)
Eosinophils Relative: 1 %
HCT: 43.1 % (ref 38.5–50.0)
Hemoglobin: 14.6 g/dL (ref 13.2–17.1)
MCH: 32.5 pg (ref 27.0–33.0)
MCHC: 33.9 g/dL (ref 32.0–36.0)
MCV: 96 fL (ref 80.0–100.0)
MPV: 9.6 fL (ref 7.5–12.5)
Monocytes Relative: 13.4 %
Neutro Abs: 3390 {cells}/uL (ref 1500–7800)
Neutrophils Relative %: 50.6 %
Platelets: 301 10*3/uL (ref 140–400)
RBC: 4.49 10*6/uL (ref 4.20–5.80)
RDW: 11.9 % (ref 11.0–15.0)
Total Lymphocyte: 33.8 %
WBC: 6.7 10*3/uL (ref 3.8–10.8)

## 2023-12-19 ENCOUNTER — Encounter: Payer: Self-pay | Admitting: Nurse Practitioner

## 2023-12-19 ENCOUNTER — Telehealth: Payer: Medicare HMO | Admitting: Nurse Practitioner

## 2023-12-19 DIAGNOSIS — Z0000 Encounter for general adult medical examination without abnormal findings: Secondary | ICD-10-CM

## 2023-12-19 NOTE — Progress Notes (Signed)
This service is provided via telemedicine  No vital signs collected/recorded due to the encounter was a telemedicine visit.   Location of patient (ex: home, work):  Home  Patient consents to a telephone visit:  Yes  Location of the provider (ex: office, home):  Office [place].   Name of any referring provider:  na  Names of all persons participating in the telemedicine service and their role in the encounter:  Hortense Ramal, Patient, Nelda Severe, CMA, Abbey Chatters, NP  Time spent on call:  7:11

## 2023-12-19 NOTE — Progress Notes (Signed)
Subjective:   Rodney Foster is a 73 y.o. male who presents for Medicare Annual/Subsequent preventive examination.  Visit Complete: Virtual I connected with  Rodney Foster on 12/19/23 by a video and audio enabled telemedicine application and verified that I am speaking with the correct person using two identifiers.  Patient Location: Home  Provider Location: Office/Clinic  I discussed the limitations of evaluation and management by telemedicine. The patient expressed understanding and agreed to proceed.  Vital Signs: Because this visit was a virtual/telehealth visit, some criteria may be missing or patient reported. Any vitals not documented were not able to be obtained and vitals that have been documented are patient reported.  .  Cardiac Risk Factors include: advanced age (>60men, >74 women);male gender;dyslipidemia;hypertension     Objective:    There were no vitals filed for this visit. There is no height or weight on file to calculate BMI.     12/19/2023    9:45 AM 06/17/2023    9:07 AM 02/18/2023   11:44 AM 12/14/2022   10:01 AM 12/06/2022    9:45 AM 04/30/2022    9:47 AM 12/22/2021    4:00 PM  Advanced Directives  Does Patient Have a Medical Advance Directive? Yes Yes Yes Yes Yes Yes No  Type of Estate agent of [place];Living will Living will Healthcare Power of [place];Living will;Out of facility DNR (pink MOST or yellow form) Healthcare Power of [place];Living will Healthcare Power of [place];Living will Living will Living will  Does patient want to make changes to medical advance directive? No - Patient declined No - Patient declined No - Patient declined No - Patient declined No - Patient declined No - Patient declined No - Patient declined  Copy of Healthcare Power of Attorney in Chart? No - copy requested  No - copy requested      Would patient like information on creating a medical advance directive?       No - Patient declined    Current  Medications (verified) Outpatient Encounter Medications as of 12/19/2023  Medication Sig   acyclovir (ZOVIRAX) 400 MG tablet TAKE 1 TABLET BY MOUTH EVERY DAY   amLODipine (NORVASC) 5 MG tablet TAKE ONE TABLET BY MOUTH ONCE DAILY TO CONTROL BLOOD PRESSURE   atorvastatin (LIPITOR) 20 MG tablet TAKE 1 TABLET BY MOUTH EVERYDAY AT BEDTIME   febuxostat (ULORIC) 40 MG tablet Take 1 tablet (40 mg total) by mouth daily.   guaiFENesin (MUCINEX) 600 MG 12 hr tablet Take 600 mg by mouth 2 (two) times daily.   losartan (COZAAR) 100 MG tablet TAKE ONE BY MOUTH DAILY FOR BLOOD PRESSURE CONTROL   Multiple Vitamins-Minerals (ANTIOXIDANT PO) Take 30 mLs by mouth daily. Aronia Berry Juice   tadalafil (CIALIS) 20 MG tablet Take 1 tablet (20 mg total) by mouth daily as needed for erectile dysfunction.   tamsulosin (FLOMAX) 0.4 MG CAPS capsule Take 1 capsule (0.4 mg total) by mouth daily.   No facility-administered encounter medications on file as of 12/19/2023.    Allergies (verified) Patient has no known allergies.   History: Past Medical History:  Diagnosis Date   Backache, unspecified    Bronchitis    Cough variant asthma 05/03/2017   Genital herpes    Gout    Hearing loss    Hemorrhoids    Hernia    Herpes simplex    Hyperglycemia    Hyperlipidemia    Hypertension    Impotence    Insomnia, unspecified  Osteoarthrosis, unspecified whether generalized or localized, unspecified site    Tinea pedis, right    Unspecified disorder of male genital organs    Past Surgical History:  Procedure Laterality Date   fracture left femur  1984   ganglion repair of left forarm  2000   IVP  12   ML old comp fracture   KNEE SURGERY Right 01/21/2019   [facility]    NASAL SEPTUM SURGERY  1997   TONSILLECTOMY  1959   TONSILLECTOMY     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   Procedure: TOTAL KNEE ARTHROPLASTY;  Surgeon: Frederico Hamman, MD;  Location: [facility];  Service: Orthopedics;  Laterality:  Right;   TOTAL KNEE ARTHROPLASTY Left 05/06/2020   Procedure: TOTAL KNEE ARTHROPLASTY;  Surgeon: Frederico Hamman, MD;  Location: [facility];  Service: Orthopedics;  Laterality: Left;   UMBILICAL HERNIA REPAIR  2002   Family History  Problem Relation Age of Onset   COPD Mother    Aortic aneurysm Father    Social History   Socioeconomic History   Marital status: Divorced    Spouse name: Not on file   Number of children: Not on file   Years of education: Not on file   Highest education level: Not on file  Occupational History   Not on file  Tobacco Use   Smoking status: Former    Current packs/day: 0.00    Average packs/day: 1 pack/day for 15.0 years (15.0 ttl pk-yrs)    Types: Cigarettes    Start date: 12/31/1968    Quit date: 01/01/1984    Years since quitting: 39.9   Smokeless tobacco: Former    Quit date: 1990  Vaping Use   Vaping status: Never Used  Substance and Sexual Activity   Alcohol use: Not Currently   Drug use: No   Sexual activity: Yes  Other Topics Concern   Not on file  Social History Narrative   Not on file   Social Drivers of Health   Financial Resource Strain: Low Risk  (09/05/2018)   Overall Financial Resource Strain (CARDIA)    Difficulty of Paying Living Expenses: Not hard at all  Food Insecurity: No Food Insecurity (09/05/2018)   Hunger Vital Sign    Worried About Running Out of Food in the Last Year: Never true    Ran Out of Food in the Last Year: Never true  Transportation Needs: No Transportation Needs (09/05/2018)   PRAPARE - Administrator, Civil Service (Medical): No    Lack of Transportation (Non-Medical): No  Physical Activity: Insufficiently Active (09/05/2018)   Exercise Vital Sign    Days of Exercise per Week: 4 days    Minutes of Exercise per Session: 30 min  Stress: No Stress Concern Present (09/05/2018)   [employer] of Occupational Health - Occupational Stress Questionnaire    Feeling of Stress : Only a little  Social  Connections: Moderately Isolated (09/05/2018)   Social Connection and Isolation Panel [NHANES]    Frequency of Communication with Friends and Family: More than three times a week    Frequency of Social Gatherings with Friends and Family: More than three times a week    Attends Religious Services: Never    Database administrator or Organizations: No    Attends Banker Meetings: Never    Marital Status: Divorced    Tobacco Counseling Counseling given: Not Answered   Clinical Intake:     Pain : No/denies pain  BMI - recorded: 27 Nutritional Status: BMI 25 -29 Overweight Diabetes: No  How often do you need to have someone help you when you read instructions, pamphlets, or other written materials from your doctor or pharmacy?: 1 - Never         Activities of Daily Living    12/19/2023    9:57 AM  In your present state of health, do you have any difficulty performing the following activities:  Hearing? 0  Vision? 0  Difficulty concentrating or making decisions? 0  Walking or climbing stairs? 0  Dressing or bathing? 0  Doing errands, shopping? 0  Preparing Food and eating ? N  Using the Toilet? N  In the past six months, have you accidently leaked urine? N  Do you have problems with loss of bowel control? N  Managing your Medications? N  Managing your Finances? N  Housekeeping or managing your Housekeeping? N    Patient Care Team: Sharon Seller, NP as PCP - General (Geriatric Medicine)  Indicate any recent Medical Services you may have received from other than [facility] providers in the past year (date may be approximate).     Assessment:   This is a routine wellness examination for [place].  Hearing/Vision screen Vision Screening - Comments:: Dr. Allyne Gee Last Eye Exam: 10/2023   Goals Addressed   None    Depression Screen    12/19/2023    9:46 AM 06/17/2023   10:00 AM 12/06/2022    9:45 AM 11/21/2021    4:09 PM 04/17/2021   10:11 AM  10/28/2020    1:31 PM 09/15/2020    9:29 AM  PHQ 2/9 Scores  PHQ - 2 Score 0 0 0 0 0 0 0    Fall Risk    12/19/2023    9:46 AM 12/16/2023   10:02 AM 06/17/2023   10:00 AM 02/18/2023   11:44 AM 12/14/2022   10:00 AM  Fall Risk   Falls in the past year? 0 0 0 0 0  Number falls in past yr: 0 0 0 0 0  Injury with Fall? 0 0 0 0 0  Risk for fall due to :  No Fall Risks No Fall Risks No Fall Risks No Fall Risks  Follow up  Falls evaluation completed Falls evaluation completed Falls evaluation completed Falls evaluation completed    MEDICARE RISK AT HOME: Medicare Risk at Home Any stairs in or around the home?: Yes If so, are there any without handrails?: No Home free of loose throw rugs in walkways, pet beds, electrical cords, etc?: Yes Adequate lighting in your home to reduce risk of falls?: Yes Life alert?: No Use of a cane, walker or w/c?: No Grab bars in the bathroom?: No Shower chair or bench in shower?: Yes Elevated toilet seat or a handicapped toilet?: No  TIMED UP AND GO:  Was the test performed?  No    Cognitive Function:    09/05/2018    8:31 AM 08/26/2017    3:42 PM 10/10/2016    2:27 PM  MMSE - Mini Mental State Exam  Orientation to time 5 5 5   Orientation to Place 5 5 5   Registration 3 3 3   Attention/ Calculation 5 5 5   Recall 2 2 3   Language- name 2 objects 2 2 2   Language- repeat 1 1 1   Language- follow 3 step command 3 2 3   Language- read & follow direction 1 1 1   Write a sentence 1 1 1  Copy design 1 1 1   Total score 29 28 30         12/19/2023    9:46 AM 12/06/2022    9:47 AM 11/21/2021    4:11 PM 09/15/2020    9:31 AM 09/15/2019    9:46 AM  6CIT Screen  What Year? 0 points 0 points 0 points 0 points 0 points  What month? 0 points 0 points 0 points 0 points 0 points  What time? 0 points 0 points 0 points 0 points 0 points  Count back from 20 0 points 0 points 0 points 0 points 0 points  Months in reverse 0 points 0 points 0 points 0 points 0  points  Repeat phrase 0 points 0 points 0 points 0 points 0 points  Total Score 0 points 0 points 0 points 0 points 0 points    Immunizations Immunization History  Administered Date(s) Administered   Fluad Quad(high Dose 65+) 08/20/2019, 10/28/2020, 10/09/2021, 10/19/2022   Influenza, High Dose Seasonal PF 10/17/2017, 09/05/2018, 10/08/2023   Influenza,inj,Quad PF,6+ Mos 12/02/2013, 03/07/2016, 10/10/2016   Influenza-Unspecified 12/05/2011, 10/08/2023   Moderna Covid-19 Fall Seasonal Vaccine 6yrs & older 10/19/2022   PFIZER(Purple Top)SARS-COV-2 Vaccination 01/22/2020, 02/12/2020, 09/14/2020, 04/18/2021   Pfizer Covid-19 Vaccine Bivalent Booster 72yrs & up 09/19/2021, 05/10/2022   Pfizer(Comirnaty)Fall Seasonal Vaccine 12 years and older 10/19/2022, 09/07/2023   Pneumococcal Conjugate-13 08/26/2017   Pneumococcal Polysaccharide-23 09/05/2018   Respiratory Syncytial Virus Vaccine,Recomb Aduvanted(Arexvy) 10/19/2022, 11/09/2022   Td 12/31/1994, 12/03/2013   Tdap 12/16/2023   Zoster Recombinant(Shingrix) 04/15/2018, 08/04/2018    TDAP status: Up to date  Flu Vaccine status: Up to date  Pneumococcal vaccine status: Up to date  Covid-19 vaccine status: Information provided on how to obtain vaccines.   Qualifies for Shingles Vaccine? Yes   Zostavax completed No   Shingrix Completed?: Yes  Screening Tests Health Maintenance  Topic Date Due   Medicare Annual Wellness (AWV)  12/18/2024   Fecal DNA (Cologuard)  12/11/2025   DTaP/Tdap/Td (4 - Td or Tdap) 12/15/2033   Pneumonia Vaccine 61+ Years old  Completed   INFLUENZA VACCINE  Completed   COVID-19 Vaccine  Completed   Hepatitis C Screening  Completed   Zoster Vaccines- Shingrix  Completed   HPV VACCINES  Aged Out   Colonoscopy  Discontinued    Health Maintenance  There are no preventive care reminders to display for this patient.   Colorectal cancer screening: No longer required.   Lung Cancer Screening: (Low Dose  CT Chest recommended if Age 64-80 years, 20 pack-year currently smoking OR have quit w/in 15years.) does not qualify.    Additional Screening:  Hepatitis C Screening: does qualify; Completed   Vision Screening: Recommended annual ophthalmology exams for early detection of glaucoma and other disorders of the eye. Is the patient up to date with their annual eye exam?  Yes  Who is the provider or what is the name of the office in which the patient attends annual eye exams? Allyne Gee If pt is not established with a provider, would they like to be referred to a provider to establish care? No .   Dental Screening: Recommended annual dental exams for proper oral hygiene  Community Resource Referral / Chronic Care Management: CRR required this visit?  No   CCM required this visit?  No     Plan:     I have personally reviewed and noted the following in the patient's chart:   Medical and social history Use of alcohol,  tobacco or illicit drugs  Current medications and supplements including opioid prescriptions. Patient is not currently taking opioid prescriptions. Functional ability and status Nutritional status Physical activity Advanced directives List of other physicians Hospitalizations, surgeries, and ER visits in previous 12 months Vitals Screenings to include cognitive, depression, and falls Referrals and appointments  In addition, I have reviewed and discussed with patient certain preventive protocols, quality metrics, and best practice recommendations. A written personalized care plan for preventive services as well as general preventive health recommendations were provided to patient.     Sharon Seller, NP   12/19/2023   After Visit Summary: (MyChart) Due to this being a telephonic visit, the after visit summary with patients personalized plan was offered to patient via MyChart

## 2023-12-19 NOTE — Patient Instructions (Signed)
  Rodney Foster , Thank you for taking time to come for your Medicare Wellness Visit. I appreciate your ongoing commitment to your health goals. Please review the following plan we discussed and let me know if I can assist you in the future.     This is a list of the screening recommended for you and due dates:  Health Maintenance  Topic Date Due   Medicare Annual Wellness Visit  12/18/2024   Cologuard (Stool DNA test)  12/11/2025   DTaP/Tdap/Td vaccine (4 - Td or Tdap) 12/15/2033   Pneumonia Vaccine  Completed   Flu Shot  Completed   COVID-19 Vaccine  Completed   Hepatitis C Screening  Completed   Zoster (Shingles) Vaccine  Completed   HPV Vaccine  Aged Out   Colon Cancer Screening  Discontinued

## 2023-12-20 NOTE — Telephone Encounter (Signed)
Order- DME [facility]- replacement for old CPAP machine, auto 5-15, mask of choice, humidifier, supplies, AirView/ card

## 2023-12-21 NOTE — Telephone Encounter (Signed)
Called patient but he did not answer. Left message for him to call back.  

## 2024-01-13 NOTE — Telephone Encounter (Signed)
 Patient states needs order for CPAP machine, supplies and pressure settings. Patient uses [facility] for CPAP machine. The location is [place]. Patient phone number is [phone].

## 2024-01-13 NOTE — Telephone Encounter (Signed)
 Order has been placed for pt to receive replacement cpap machine from [facility]. Attempted to call pt to let him know this had been done but unable to reach. Left a detailed message for pt letting him know this had been done.

## 2024-01-15 DIAGNOSIS — M25511 Pain in right shoulder: Secondary | ICD-10-CM | POA: Diagnosis not present

## 2024-01-15 DIAGNOSIS — S2241XA Multiple fractures of ribs, right side, initial encounter for closed fracture: Secondary | ICD-10-CM | POA: Diagnosis not present

## 2024-01-15 DIAGNOSIS — M19011 Primary osteoarthritis, right shoulder: Secondary | ICD-10-CM | POA: Diagnosis not present

## 2024-01-22 ENCOUNTER — Other Ambulatory Visit: Payer: Self-pay | Admitting: Nurse Practitioner

## 2024-01-27 ENCOUNTER — Telehealth: Payer: Self-pay | Admitting: Internal Medicine

## 2024-01-27 DIAGNOSIS — G4733 Obstructive sleep apnea (adult) (pediatric): Secondary | ICD-10-CM

## 2024-01-27 NOTE — Telephone Encounter (Signed)
PT got a new CPAP today and was told how to use it. [facility] told him to call his Dr. So he could set the adjustments.

## 2024-01-28 NOTE — Telephone Encounter (Signed)
Pt states he needs the cpap settings set for his new cpap machine. Please advise.

## 2024-01-28 NOTE — Telephone Encounter (Signed)
Order- DME [facility]- his CPAP settings are auto 5-20, with mask of choice, humidifier, supplies, AirView/ card

## 2024-01-29 NOTE — Telephone Encounter (Signed)
Last night PT states the settings were "just awful" asking [place] to check with Dr. Maple Hudson about this. Please call PT to advise @ [phone]

## 2024-01-30 NOTE — Telephone Encounter (Signed)
Spoke with the pt  He is aware we are sending for pressure change  Referral placed for this  Nothing further needed

## 2024-04-23 DIAGNOSIS — M25511 Pain in right shoulder: Secondary | ICD-10-CM | POA: Diagnosis not present

## 2024-05-12 ENCOUNTER — Other Ambulatory Visit: Admitting: Nurse Practitioner

## 2024-05-12 DIAGNOSIS — G8929 Other chronic pain: Secondary | ICD-10-CM | POA: Diagnosis not present

## 2024-05-12 DIAGNOSIS — M19011 Primary osteoarthritis, right shoulder: Secondary | ICD-10-CM | POA: Diagnosis not present

## 2024-05-12 DIAGNOSIS — M1A9XX0 Chronic gout, unspecified, without tophus (tophi): Secondary | ICD-10-CM

## 2024-05-12 DIAGNOSIS — M25511 Pain in right shoulder: Secondary | ICD-10-CM | POA: Diagnosis not present

## 2024-05-12 MED ORDER — FEBUXOSTAT 40 MG PO TABS: 40.0000 mg | ORAL_TABLET | Freq: Every day | ORAL | 1 refills | Status: DC

## 2024-05-12 MED ORDER — FEBUXOSTAT 40 MG PO TABS
40.0000 mg | ORAL_TABLET | Freq: Every day | ORAL | 1 refills | Status: DC
Start: 2024-05-12 — End: 2024-05-12

## 2024-05-12 NOTE — Addendum Note (Signed)
 Addended byReyne Cave, Toinette Lackie L on: 05/12/2024 03:06 PM   Modules accepted: Orders

## 2024-05-12 NOTE — Telephone Encounter (Signed)
 Copied from CRM [id number]. Topic: Clinical - Medication Refill >> May 12, 2024  1:23 PM Carrielelia G wrote: Medication: febuxostat  (ULORIC ) 40 MG tablet  Has the patient contacted their pharmacy? No (Agent: If no, request that the patient contact the pharmacy for the refill. If patient does not wish to contact the pharmacy document the reason why and proceed with request.) (Agent: If yes, when and what did the pharmacy advise?)  This is the patient's preferred pharmacy:  [id number] - [place], [place] - [address] AT [address] [address] [place] [address] Phone: [phone] Fax: [phone]   Is this the correct pharmacy for this prescription? Yes If no, delete pharmacy and type the correct one.     Is the patient out of the medication? Yes   ( but only asking for 15 because he has to pay out of pocket and his shipment from [place] has not come in)   Has the patient been seen for an appointment in the last year OR does the patient have an upcoming appointment? Yes  Can we respond through MyChart? No  Agent: Please be advised that Rx refills may take up to 3 business days. We ask that you follow-up with your pharmacy.

## 2024-05-15 ENCOUNTER — Telehealth: Payer: Self-pay | Admitting: Internal Medicine

## 2024-05-15 NOTE — Telephone Encounter (Signed)
 Fax received from Dr. Marionette Sick with [facility] to perform a right reverse total shoulder arthroplasty on patient.  Patient needs surgery clearance. Surgery is in Oct 2025. Patient was seen on 07/26/23. Office protocol is a risk assessment can be sent to surgeon if patient has been seen in 60 days or less.   Pt is scheduled with Dr Linder Revere for 07/27/24-added need for risk assessment to appt notes and will route to clearance pool for now

## 2024-05-29 ENCOUNTER — Other Ambulatory Visit: Payer: Self-pay | Admitting: Nurse Practitioner

## 2024-05-29 DIAGNOSIS — I10 Essential (primary) hypertension: Secondary | ICD-10-CM

## 2024-06-15 ENCOUNTER — Encounter: Payer: Self-pay | Admitting: Nurse Practitioner

## 2024-06-15 ENCOUNTER — Ambulatory Visit (INDEPENDENT_AMBULATORY_CARE_PROVIDER_SITE_OTHER): Payer: Medicare HMO | Admitting: Nurse Practitioner

## 2024-06-15 VITALS — BP 128/82 | HR 75 | Temp 98.1°F | Ht 70.0 in | Wt 210.0 lb

## 2024-06-15 DIAGNOSIS — G4733 Obstructive sleep apnea (adult) (pediatric): Secondary | ICD-10-CM | POA: Diagnosis not present

## 2024-06-15 DIAGNOSIS — E782 Mixed hyperlipidemia: Secondary | ICD-10-CM | POA: Diagnosis not present

## 2024-06-15 DIAGNOSIS — D692 Other nonthrombocytopenic purpura: Secondary | ICD-10-CM | POA: Diagnosis not present

## 2024-06-15 DIAGNOSIS — R350 Frequency of micturition: Secondary | ICD-10-CM

## 2024-06-15 DIAGNOSIS — E6609 Other obesity due to excess calories: Secondary | ICD-10-CM

## 2024-06-15 DIAGNOSIS — J309 Allergic rhinitis, unspecified: Secondary | ICD-10-CM

## 2024-06-15 DIAGNOSIS — B009 Herpesviral infection, unspecified: Secondary | ICD-10-CM

## 2024-06-15 DIAGNOSIS — J410 Simple chronic bronchitis: Secondary | ICD-10-CM

## 2024-06-15 DIAGNOSIS — M1A9XX0 Chronic gout, unspecified, without tophus (tophi): Secondary | ICD-10-CM

## 2024-06-15 DIAGNOSIS — M150 Primary generalized (osteo)arthritis: Secondary | ICD-10-CM

## 2024-06-15 DIAGNOSIS — N401 Enlarged prostate with lower urinary tract symptoms: Secondary | ICD-10-CM

## 2024-06-15 DIAGNOSIS — I10 Essential (primary) hypertension: Secondary | ICD-10-CM | POA: Diagnosis not present

## 2024-06-15 DIAGNOSIS — E66811 Obesity, class 1: Secondary | ICD-10-CM

## 2024-06-15 DIAGNOSIS — Z6830 Body mass index (BMI) 30.0-30.9, adult: Secondary | ICD-10-CM

## 2024-06-15 DIAGNOSIS — Z01818 Encounter for other preprocedural examination: Secondary | ICD-10-CM

## 2024-06-15 NOTE — Assessment & Plan Note (Signed)
 Continues on CPAP with pulmonary follow up

## 2024-06-15 NOTE — Assessment & Plan Note (Signed)
 Controlled on flomax  daily  No changes in frequency or flow

## 2024-06-15 NOTE — Progress Notes (Signed)
 Careteam: Patient Care Team: Verma Gobble, NP as PCP - General (Geriatric Medicine)  PLACE OF SERVICE:  [facility]  Advanced Directive information Does Patient Have a Medical Advance Directive?: Yes, Type of Advance Directive: Healthcare Power of [place];Living will, Does patient want to make changes to medical advance directive?: No - Patient declined  No Known Allergies  Chief Complaint  Patient presents with   Medical Management of Chronic Issues    Medical Management of Chronic Issues. 6 Month Follow up    HPI:  Discussed the use of AI scribe software for clinical note transcription with the patient, who gave verbal consent to proceed.  History of Present Illness Rodney Foster is a 74 year old male who presents for routine follow-up and pre-operative evaluation for upcoming shoulder surgery.  He is scheduled for shoulder surgery in October due to significant joint degeneration confirmed by an MRI. He experiences difficulty rolling over to his right side and manages pain with Advil , taking two in the morning and two in the evening. He received a cortisone shot over the summer for pain management.  He continues to use a CPAP machine for sleep apnea, although he finds it uncomfortable. He is under pulmonary care for bronchitis and asthma, with no recent flares. No shortness of breath, uncontrolled cough, congestion, or chest pain.  He takes Flomax  for prostate health, with no changes in urinary frequency or flow. He denies recent use of allergy  medications, although he experiences sinus issues in air-conditioned environments, leading to coughing.  He has a history of knee surgeries with no adverse reactions to anesthesia.  Denies chest pains, shortness of breath, palpitations.    No issues with constipation, maintaining regular bowel movements by consuming fruits like peaches.    Review of Systems:  Review of Systems  Constitutional:  Negative for chills, fever and  weight loss.  HENT:  Negative for tinnitus.   Respiratory:  Negative for cough, sputum production and shortness of breath.   Cardiovascular:  Negative for chest pain, palpitations and leg swelling.  Gastrointestinal:  Negative for abdominal pain, constipation, diarrhea and heartburn.  Genitourinary:  Negative for dysuria, frequency and urgency.  Musculoskeletal:  Positive for joint pain. Negative for back pain, falls and myalgias.  Skin: Negative.   Neurological:  Negative for dizziness and headaches.  Psychiatric/Behavioral:  Negative for depression and memory loss. The patient does not have insomnia.     Past Medical History:  Diagnosis Date   Backache, unspecified    Bronchitis    Cough variant asthma 05/03/2017   Genital herpes    Gout    Hearing loss    Hemorrhoids    Hernia    Herpes simplex    Hyperglycemia    Hyperlipidemia    Hypertension    Impotence    Insomnia, unspecified    Osteoarthrosis, unspecified whether generalized or localized, unspecified site    Tinea pedis, right    Unspecified disorder of male genital organs    Past Surgical History:  Procedure Laterality Date   fracture left femur  1984   ganglion repair of left forarm  2000   IVP  88   ML old comp fracture   KNEE SURGERY Right 01/21/2019   Gilberto Labella    NASAL SEPTUM SURGERY  1997   TONSILLECTOMY  1959   TONSILLECTOMY     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   Procedure: TOTAL KNEE ARTHROPLASTY;  Surgeon: Marlena Sima, MD;  Location: [facility];  Service: Orthopedics;  Laterality: Right;   TOTAL KNEE ARTHROPLASTY Left 05/06/2020   Procedure: TOTAL KNEE ARTHROPLASTY;  Surgeon: Marlena Sima, MD;  Location: [facility];  Service: Orthopedics;  Laterality: Left;   UMBILICAL HERNIA REPAIR  2002   Social History:   reports that he quit smoking about 40 years ago. His smoking use included cigarettes. He started smoking about 55 years ago. He has a 15 pack-year smoking history. He quit smokeless  tobacco use about 35 years ago. He reports that he does not currently use alcohol. He reports that he does not use drugs.  Family History  Problem Relation Age of Onset   COPD Mother    Aortic aneurysm Father     Medications: Patient's Medications  New Prescriptions   No medications on file  Previous Medications   ACYCLOVIR  (ZOVIRAX ) 400 MG TABLET    TAKE 1 TABLET BY MOUTH EVERY DAY   AMLODIPINE  (NORVASC ) 5 MG TABLET    TAKE ONE TABLET BY MOUTH ONCE DAILY TO CONTROL BLOOD PRESSURE   ATORVASTATIN  (LIPITOR) 20 MG TABLET    TAKE 1 TABLET BY MOUTH EVERYDAY AT BEDTIME   FEBUXOSTAT  (ULORIC ) 40 MG TABLET    Take 1 tablet (40 mg total) by mouth daily.   GUAIFENESIN  (MUCINEX ) 600 MG 12 HR TABLET    Take 600 mg by mouth 2 (two) times daily.   LOSARTAN  (COZAAR ) 100 MG TABLET    TAKE ONE BY MOUTH DAILY FOR BLOOD PRESSURE CONTROL   MULTIPLE VITAMINS-MINERALS (ANTIOXIDANT PO)    Take 30 mLs by mouth daily. Aronia Berry Juice   TADALAFIL  (CIALIS ) 20 MG TABLET    Take 1 tablet (20 mg total) by mouth daily as needed for erectile dysfunction.   TAMSULOSIN  (FLOMAX ) 0.4 MG CAPS CAPSULE    Take 1 capsule (0.4 mg total) by mouth daily.  Modified Medications   No medications on file  Discontinued Medications   No medications on file    Physical Exam:  Vitals:   06/15/24 0957  BP: 128/82  Pulse: 75  Temp: 98.1 F (36.7 C)  SpO2: 96%  Weight: 210 lb (95.3 kg)  Height: 5' 10 (1.778 m)   Body mass index is 30.13 kg/m. Wt Readings from Last 3 Encounters:  06/15/24 210 lb (95.3 kg)  12/16/23 198 lb (89.8 kg)  07/26/23 199 lb 3.2 oz (90.4 kg)    Physical Exam Constitutional:      General: He is not in acute distress.    Appearance: He is well-developed. He is not diaphoretic.  HENT:     Head: Normocephalic and atraumatic.     Right Ear: External ear normal.     Left Ear: External ear normal.     Mouth/Throat:     Pharynx: No oropharyngeal exudate.   Eyes:     Conjunctiva/sclera:  Conjunctivae normal.     Pupils: Pupils are equal, round, and reactive to light.    Cardiovascular:     Rate and Rhythm: Normal rate and regular rhythm.     Heart sounds: Normal heart sounds.  Pulmonary:     Effort: Pulmonary effort is normal.     Breath sounds: Normal breath sounds.  Abdominal:     General: Bowel sounds are normal.     Palpations: Abdomen is soft.   Musculoskeletal:        General: No tenderness.     Cervical back: Normal range of motion and neck supple.     Right lower leg: No edema.  Left lower leg: No edema.   Skin:    General: Skin is warm and dry.   Neurological:     Mental Status: He is alert and oriented to person, place, and time. Mental status is at baseline.   Psychiatric:        Mood and Affect: Mood normal.     Labs reviewed: Basic Metabolic Panel: Recent Labs    06/17/23 1035 12/16/23 1023  NA 137 135  K 4.6 4.6  CL 101 101  CO2 29 24  GLUCOSE 92 89  BUN 16 14  CREATININE 0.96 0.70  CALCIUM  9.7 9.3   Liver Function Tests: Recent Labs    06/17/23 1035 12/16/23 1023  AST 19 20  ALT 24 28  BILITOT 0.6 0.5  PROT 6.7 6.9   No results for input(s): LIPASE, AMYLASE in the last 8760 hours. No results for input(s): AMMONIA in the last 8760 hours. CBC: Recent Labs    06/17/23 1035 12/16/23 1023  WBC 7.6 6.7  NEUTROABS 3,473 3,390  HGB 15.0 14.6  HCT 44.0 43.1  MCV 95.0 96.0  PLT 322 301   Lipid Panel: Recent Labs    06/17/23 1035  CHOL 160  HDL 67  LDLCALC 79  TRIG 67  CHOLHDL 2.4   TSH: No results for input(s): TSH in the last 8760 hours. A1C: Lab Results  Component Value Date   HGBA1C 5.5 01/20/2019     Assessment/Plan OSA (obstructive sleep apnea) Assessment & Plan: Continues on CPAP with pulmonary follow up   Simple chronic bronchitis (HCC) Assessment & Plan: `well controlled at this time. No recent flares.   Chronic gout without tophus, unspecified cause, unspecified  site Assessment & Plan: Stable without recent flare. Continues on uloric  daily   Orders: -     Uric acid  Mixed hyperlipidemia Assessment & Plan: Continues on atorvastatin  daily at bedtime. Continue dietary modifications as well.   Orders: -     Lipid panel -     CBC with Differential/Platelet -     COMPLETE METABOLIC PANEL WITHOUT GFR  Essential hypertension Assessment & Plan: Blood pressure well controlled, goal bp <140/90 Continue current medications and dietary modifications follow metabolic panel   Orders: -     CBC with Differential/Platelet -     EKG 12-Lead- NSR  Senile purpura (HCC) Assessment & Plan: Continue to monitor.  Not on ASA   Benign prostatic hyperplasia with urinary frequency Assessment & Plan: Controlled on flomax  daily  No changes in frequency or flow  Orders: -     PSA  Allergic rhinitis, unspecified seasonality, unspecified trigger Assessment & Plan: Controlled, not currently taking medication at this time.    Primary osteoarthritis involving multiple joints Assessment & Plan: Ongoing, worsening shoulder pain, plan for right shoulder replacement due to failed conservative treatment options and ongoing pain   Pre-op evaluation  Medically stable. Bronchitis and chronic sinusitis have been stable. He is obese however weight has been stable. Not on ASA  Continues on CPAP for OSA  At risk for post op delirium due to age but has tolerated well in the past.   Class 1 obesity due to excess calories with serious comorbidity and body mass index (BMI) of 30.0 to 30.9 in adult Assessment & Plan: -education provided on healthy weight loss through increase in physical activity and proper nutrition    Herpes simplex Assessment & Plan: Uses acyclovir  for prevention     Return in about 6 months (around 12/15/2024)  for routine follow up.  Janaiah Vetrano K. Denney Fisherman [facility] [phone]

## 2024-06-15 NOTE — Assessment & Plan Note (Signed)
 Continues on atorvastatin  daily at bedtime. Continue dietary modifications as well.

## 2024-06-15 NOTE — Assessment & Plan Note (Signed)
 Ongoing, worsening shoulder pain, plan for right shoulder replacement due to failed conservative treatment options and ongoing pain

## 2024-06-15 NOTE — Assessment & Plan Note (Signed)
 Stable without recent flare. Continues on uloric  daily

## 2024-06-15 NOTE — Assessment & Plan Note (Signed)
 Blood pressure well controlled, goal bp <140/90 Continue current medications and dietary modifications follow metabolic panel

## 2024-06-15 NOTE — Assessment & Plan Note (Signed)
-  education provided on healthy weight loss through increase in physical activity and proper nutrition

## 2024-06-15 NOTE — Assessment & Plan Note (Signed)
 Uses acyclovir  for prevention

## 2024-06-15 NOTE — Assessment & Plan Note (Signed)
 Continue to monitor.  Not on ASA

## 2024-06-15 NOTE — Assessment & Plan Note (Signed)
`  well controlled at this time. No recent flares.

## 2024-06-15 NOTE — Assessment & Plan Note (Signed)
 Controlled, not currently taking medication at this time.

## 2024-06-16 ENCOUNTER — Ambulatory Visit: Payer: Self-pay | Admitting: Nurse Practitioner

## 2024-06-16 LAB — CBC WITH DIFFERENTIAL/PLATELET
Absolute Lymphocytes: 2858 {cells}/uL (ref 850–3900)
Absolute Monocytes: 814 {cells}/uL (ref 200–950)
Basophils Absolute: 72 {cells}/uL (ref 0–200)
Basophils Relative: 1 %
Eosinophils Absolute: 122 {cells}/uL (ref 15–500)
Eosinophils Relative: 1.7 %
HCT: 42.9 % (ref 38.5–50.0)
Hemoglobin: 14.8 g/dL (ref 13.2–17.1)
MCH: 33.6 pg — ABNORMAL HIGH (ref 27.0–33.0)
MCHC: 34.5 g/dL (ref 32.0–36.0)
MCV: 97.5 fL (ref 80.0–100.0)
MPV: 9.8 fL (ref 7.5–12.5)
Monocytes Relative: 11.3 %
Neutro Abs: 3334 {cells}/uL (ref 1500–7800)
Neutrophils Relative %: 46.3 %
Platelets: 253 10*3/uL (ref 140–400)
RBC: 4.4 10*6/uL (ref 4.20–5.80)
RDW: 12.3 % (ref 11.0–15.0)
Total Lymphocyte: 39.7 %
WBC: 7.2 10*3/uL (ref 3.8–10.8)

## 2024-06-16 LAB — LIPID PANEL
Cholesterol: 200 mg/dL — ABNORMAL HIGH (ref <200)
HDL: 103 mg/dL (ref >=40)
LDL Cholesterol (Calc): 79 mg/dL
Non-HDL Cholesterol (Calc): 97 mg/dL (ref <130)
Total CHOL/HDL Ratio: 1.9 (calc) (ref <5.0)
Triglycerides: 95 mg/dL (ref <150)

## 2024-06-16 LAB — COMPREHENSIVE METABOLIC PANEL WITH GFR
AG Ratio: 2.2 (calc) (ref 1.0–2.5)
ALT: 20 U/L (ref 9–46)
AST: 22 U/L (ref 10–35)
Albumin: 4.6 g/dL (ref 3.6–5.1)
Alkaline phosphatase (APISO): 70 U/L (ref 35–144)
BUN: 13 mg/dL (ref 7–25)
CO2: 26 mmol/L (ref 20–32)
Calcium: 9.7 mg/dL (ref 8.6–10.3)
Chloride: 101 mmol/L (ref 98–110)
Creat: 0.74 mg/dL (ref 0.70–1.28)
Globulin: 2.1 g/dL (ref 1.9–3.7)
Glucose, Bld: 85 mg/dL (ref 65–139)
Potassium: 4.7 mmol/L (ref 3.5–5.3)
Sodium: 139 mmol/L (ref 135–146)
Total Bilirubin: 0.4 mg/dL (ref 0.2–1.2)
Total Protein: 6.7 g/dL (ref 6.1–8.1)
eGFR: 94 mL/min/{1.73_m2} (ref >=60)

## 2024-06-16 LAB — URIC ACID: Uric Acid, Serum: 4.2 mg/dL (ref 4.0–8.0)

## 2024-06-16 LAB — PSA: PSA: 2.18 ng/mL (ref <=4.00)

## 2024-06-22 ENCOUNTER — Telehealth (INDEPENDENT_AMBULATORY_CARE_PROVIDER_SITE_OTHER): Admitting: Nurse Practitioner

## 2024-06-22 ENCOUNTER — Encounter: Payer: Self-pay | Admitting: Nurse Practitioner

## 2024-06-22 ENCOUNTER — Telehealth: Payer: Self-pay | Admitting: *Deleted

## 2024-06-22 DIAGNOSIS — J019 Acute sinusitis, unspecified: Secondary | ICD-10-CM | POA: Diagnosis not present

## 2024-06-22 NOTE — Progress Notes (Signed)
 Careteam: Patient Care Team: [name], NP as PCP - General (Geriatric Medicine)  Advanced Directive information    No Known Allergies  Chief Complaint  Patient presents with   Acute Visit    Requesting antibiotic for Sx     Discussed the use of AI scribe software for clinical note transcription with the patient, who gave verbal consent to proceed.  History of Present Illness Rodney Foster is a 74 year old male who presents with sinus congestion and sore throat.  He began experiencing a scratchy throat 4 days ago, which worsened during a trip to [place]  3 days ago. Over the weekend, he experienced significant discomfort with aches, pains, and fatigue, describing the days as 'hell'.  He has sinus congestion, a sore throat, and fatigue, with slight improvement in symptoms. He frequently blows his nose and experiences a nighttime cough due to sinus drainage. No significant improvement in taste.  He has been managing symptoms with over-the-counter medications since Saturday morning, including Seltzer Plus, Zyrtec , and a nasal spray. He also used Flomax  one night and took [place], which includes a day and night tablet. He mentions taking Alka-Seltzer Plus, which contains aspirin  325 mg, and finds Visvex helpful for sleep due to its sedative properties.   Review of Systems:  Review of Systems  Constitutional:  Positive for malaise/fatigue. Negative for chills, fever and weight loss.  HENT:  Positive for congestion. Negative for sore throat and tinnitus.   Respiratory:  Positive for cough. Negative for sputum production and shortness of breath.   Cardiovascular:  Negative for chest pain, palpitations and leg swelling.  Gastrointestinal:  Negative for abdominal pain, constipation, diarrhea and heartburn.  Genitourinary:  Negative for dysuria, frequency and urgency.  Skin: Negative.   Neurological:  Negative for dizziness and headaches.    Past Medical History:   Diagnosis Date   Backache, unspecified    Bronchitis    Cough variant asthma 05/03/2017   Genital herpes    Gout    Hearing loss    Hemorrhoids    Hernia    Herpes simplex    Hyperglycemia    Hyperlipidemia    Hypertension    Impotence    Insomnia, unspecified    Osteoarthrosis, unspecified whether generalized or localized, unspecified site    Tinea pedis, right    Unspecified disorder of male genital organs    Past Surgical History:  Procedure Laterality Date   fracture left femur  1984   ganglion repair of left forarm  2000   IVP  62   ML old comp fracture   KNEE SURGERY Right 01/21/2019   Beverley Millman    NASAL SEPTUM SURGERY  1997   TONSILLECTOMY  1959   TONSILLECTOMY     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   Procedure: TOTAL KNEE ARTHROPLASTY;  Surgeon: Shari Sieving, MD;  Location: [facility];  Service: Orthopedics;  Laterality: Right;   TOTAL KNEE ARTHROPLASTY Left 05/06/2020   Procedure: TOTAL KNEE ARTHROPLASTY;  Surgeon: Shari Sieving, MD;  Location: [facility];  Service: Orthopedics;  Laterality: Left;   UMBILICAL HERNIA REPAIR  2002   Social History:   reports that he quit smoking about 40 years ago. His smoking use included cigarettes. He started smoking about 55 years ago. He has a 15 pack-year smoking history. He quit smokeless tobacco use about 35 years ago. He reports that he does not currently use alcohol. He reports that he does not use drugs.  Family History  Problem Relation Age of Onset   COPD Mother    Aortic aneurysm Father     Medications: Patient's Medications  New Prescriptions   No medications on file  Previous Medications   ACYCLOVIR  (ZOVIRAX ) 400 MG TABLET    TAKE 1 TABLET BY MOUTH EVERY DAY   AMLODIPINE  (NORVASC ) 5 MG TABLET    TAKE ONE TABLET BY MOUTH ONCE DAILY TO CONTROL BLOOD PRESSURE   ATORVASTATIN  (LIPITOR) 20 MG TABLET    TAKE 1 TABLET BY MOUTH EVERYDAY AT BEDTIME   FEBUXOSTAT  (ULORIC ) 40 MG TABLET    Take 1 tablet (40 mg  total) by mouth daily.   GUAIFENESIN  (MUCINEX ) 600 MG 12 HR TABLET    Take 600 mg by mouth 2 (two) times daily.   LOSARTAN  (COZAAR ) 100 MG TABLET    TAKE ONE BY MOUTH DAILY FOR BLOOD PRESSURE CONTROL   MULTIPLE VITAMINS-MINERALS (ANTIOXIDANT PO)    Take 30 mLs by mouth daily. Aronia Berry Juice   TADALAFIL  (CIALIS ) 20 MG TABLET    Take 1 tablet (20 mg total) by mouth daily as needed for erectile dysfunction.   TAMSULOSIN  (FLOMAX ) 0.4 MG CAPS CAPSULE    Take 1 capsule (0.4 mg total) by mouth daily.  Modified Medications   No medications on file  Discontinued Medications   No medications on file    Physical Exam:  There were no vitals filed for this visit. There is no height or weight on file to calculate BMI. Wt Readings from Last 3 Encounters:  06/15/24 210 lb (95.3 kg)  12/16/23 198 lb (89.8 kg)  07/26/23 199 lb 3.2 oz (90.4 kg)    Physical Exam Constitutional:      Appearance: Normal appearance.   Neurological:     Mental Status: He is alert. Mental status is at baseline.   Psychiatric:        Mood and Affect: Mood normal.     Labs reviewed: Basic Metabolic Panel: Recent Labs    12/16/23 1023 06/15/24 1048  NA 135 139  K 4.6 4.7  CL 101 101  CO2 24 26  GLUCOSE 89 85  BUN 14 13  CREATININE 0.70 0.74  CALCIUM  9.3 9.7   Liver Function Tests: Recent Labs    12/16/23 1023 06/15/24 1048  AST 20 22  ALT 28 20  BILITOT 0.5 0.4  PROT 6.9 6.7   No results for input(s): LIPASE, AMYLASE in the last 8760 hours. No results for input(s): AMMONIA in the last 8760 hours. CBC: Recent Labs    12/16/23 1023 06/15/24 1048  WBC 6.7 7.2  NEUTROABS 3,390 3,334  HGB 14.6 14.8  HCT 43.1 42.9  MCV 96.0 97.5  PLT 301 253   Lipid Panel: Recent Labs    06/15/24 1048  CHOL 200*  HDL 103  LDLCALC 79  TRIG 95  CHOLHDL 1.9   TSH: No results for input(s): TSH in the last 8760 hours. A1C: Lab Results  Component Value Date   HGBA1C 5.5 01/20/2019      Assessment/Plan Acute non-recurrent sinusitis, unspecified location saline wash daily Xyzal 5 mg daily  Plain nasal saline spray throughout the day as needed May use tylenol  325 mg 2 tablets every 6-8 hours as needed aches and pains or sore throat Mucinex  DM by mouth twice daily as needed for cough and chest congestion with full glass of water   Keep well hydrated Proper nutrition  Avoid forcefully blowing nose Vit c 1000 mg twice daily Vit d 2000 units  daily   Follow up if symptoms do not improve or worsen after 7-10 days Laban Orourke K. Caro BODILY  [facility] [phone]    Virtual Visit via video  I connected with patient on 06/22/24 at  2:20 PM EDT by mychart and verified that I am speaking with the correct person using two identifiers.  Location: Patient: home  Provider: clinic   I discussed the limitations, risks, security and privacy concerns of performing an evaluation and management service by telephone and the availability of in person appointments. I also discussed with the patient that there may be a patient responsible charge related to this service. The patient expressed understanding and agreed to proceed.   I discussed the assessment and treatment plan with the patient. The patient was provided an opportunity to ask questions and all were answered. The patient agreed with the plan and demonstrated an understanding of the instructions.   The patient was advised to call back or seek an in-person evaluation if the symptoms worsen or if the condition fails to improve as anticipated.  I provided 15 minutes of non-face-to-face time during this encounter.  Lacinda Curvin K. Caro BODILY Avs printed and mailed

## 2024-06-22 NOTE — Telephone Encounter (Signed)
 Copied from CRM [id number]. Topic: Clinical - Medication Question >> Jun 22, 2024  8:06 AM Cherylann RAMAN wrote: Reason for CRM: Patient calling requesting a Z - Pack for a head cold. Please contact patient at [phone] for additional information.  Preferred Pharmacy: [id number] - [place], [place] - [address] AT [facility] [address] [place] [address] Phone: [phone] Fax: [phone] Hours: Open 24 hours      Patient was seen on 06/15/2024. No Better and requesting a antibiotic.

## 2024-06-22 NOTE — Telephone Encounter (Signed)
 Per Harlene, patient needs a virtual visit to discuss Antibiotics.   Tried calling patient and LMOM to return call.

## 2024-06-22 NOTE — Telephone Encounter (Signed)
 He had no complaints at time of visit, will need visit to discuss and for antibiotics

## 2024-06-22 NOTE — Telephone Encounter (Signed)
 Virtual appointment scheduled with patient.

## 2024-06-22 NOTE — Telephone Encounter (Signed)
 Patient Rodney Foster calling back and asking if it can be sent it as soon as possible.  He has a friend that is [facility] and [facility] it up for him.   Please advise.

## 2024-06-22 NOTE — Progress Notes (Signed)
 This service is provided via telemedicine  No vital signs collected/recorded due to the encounter was a telemedicine visit.   Location of patient (ex: home, work):  home  Patient consents to a telephone visit: yes  Location of the provider (ex: office, home):  [facility]   Name of any referring provider:  N/A  Names of all persons participating in the telemedicine service and their role in the encounter:  Marithza Malachi/ RMA, Roselie Conger, Champ Coma, NP, and Patient.   Time spent on call:  11

## 2024-06-27 DIAGNOSIS — J209 Acute bronchitis, unspecified: Secondary | ICD-10-CM | POA: Diagnosis not present

## 2024-06-27 DIAGNOSIS — B9689 Other specified bacterial agents as the cause of diseases classified elsewhere: Secondary | ICD-10-CM | POA: Diagnosis not present

## 2024-06-27 DIAGNOSIS — J019 Acute sinusitis, unspecified: Secondary | ICD-10-CM | POA: Diagnosis not present

## 2024-07-17 ENCOUNTER — Other Ambulatory Visit: Payer: Self-pay | Admitting: Nurse Practitioner

## 2024-07-17 DIAGNOSIS — E782 Mixed hyperlipidemia: Secondary | ICD-10-CM

## 2024-07-24 NOTE — Progress Notes (Signed)
 07/26/23- 45 yoM former smoker for sleep evaluation with hx OSA. Followed by Dr Kara for chronic bronchitis, Asthma, pleural effusion. LOV 3/26/24GLENWOOD Ferrari, NP acute exacerbation bronchitis No routine bronchodilators HST 11/23/18- AHI 59.6/ hr, desaturation to 55%(average 85%), body weight 214 lbs CPAP auto 5-15/ [employer]  AirSense 10 Autoset Download compliance 97%, AHI 7.2   (pressure range 10.7-14.7) Body weight today 199 lbs -----Pt is well. No issues with cpap machine Download reviewed. He accepts advice that we change pressure range to  5-20. Loves his machine. He reports that breathing is doing well since he saw Landry Ferrari in March. HRCT 03/25/23- IMPRESSION: 1. Bronchial wall thickening of the right-greater-than-left lower lobes associated with numerous small nodules and ground-glass opacities. Findings are likely due to aspiration or infection. 2. Right lower lobe bronchiectasis, likely sequela of prior infection. 3. Aortic Atherosclerosis (ICD10-I70.0).  07/27/24-  74 yoM former smoker for sleep evaluation with hx OSA. Followed by Dr Kara for chronic bronchitis, Asthma, pleural effusion. No routine bronchodilators HST 11/23/18- AHI 59.6/ hr, desaturation to 55%(average 85%), body weight 214 lbs CPAP auto 5-20/ [employer]  AirSense 10 Autoset Download compliance 77%, AHI/ 7.7/hr      pressure range 13.8-19.6, centered 18.8 Body weight today - 208 lbs Discussed the use of AI scribe software for clinical note transcription with the patient, who gave verbal consent to proceed.  History of Present Illness   Rodney Foster is a 74 year old male with sleep apnea who presents for CPAP management.  He uses a CPAP machine set to a range of five to twenty centimeters of water  pressure, primarily operating around nineteen centimeters. He experiences between seven and eight breakthrough apneas per hour. He sleeps well and does not snore. His original sleep study in 2019 showed he was stopping  breathing almost sixty times an hour. His CPAP was replaced this year. When he is next due for replacement, consider getting a BIPAP titration to qualify him for a stronger machine.  Last month, he had a head cold lasting a couple of weeks with loss of taste and a significant cough. Antibiotics resolved the symptoms. He has a history chronic bronchitis but does not currently use inhalers and feels his breathing is generally okay.     Assessment and Plan:    Obstructive Sleep Apnea Managed with CPAP therapy at  5-20 cm H2O. Experiences 7-8 apneas per hour, improved from 60 apneas per hour in 2019. Comfortable with current therapy, no snoring. Inspire therapy discussed but prefers CPAP. - Continue CPAP therapy with current settings. - Discuss CPAP use in recovery room with shoulder surgeon's office. - Bring CPAP machine to hospital for shoulder surgery- use in Recovery..  Chronic Bronchitis Recent exacerbation resolved with antibiotics. No current respiratory symptoms, breathing well-managed. - Consult primary care physician if respiratory symptoms worsen or additional help is needed. We will be happy to see him here again for general Pulmonary as needed.     ROS-see HPI   + = positive Constitutional:    weight loss, night sweats, fevers, chills, fatigue, lassitude. HEENT:    headaches, difficulty swallowing, tooth/dental problems, sore throat,       sneezing, itching, ear ache, nasal congestion, post nasal drip, snoring CV:    chest pain, orthopnea, PND, swelling in lower extremities, anasarca,                  dizziness, palpitations Resp:   shortness of breath with exertion or at rest.  productive cough,   non-productive cough, coughing up of blood.              change in color of mucus.  wheezing.   Skin:    rash or lesions. GI:  No-   heartburn, indigestion, abdominal pain, nausea, vomiting, diarrhea,                 change in bowel habits, loss of appetite GU: dysuria,  change in color of urine, no urgency or frequency.   flank pain. MS:   joint pain, stiffness, decreased range of motion, back pain. Neuro-     nothing unusual Psych:  change in mood or affect.  depression or anxiety.   memory loss.  OBJ- Physical Exam General- Alert, Oriented, Affect-appropriate, Distress- none acute Skin- rash-none, lesions- none, excoriation- none Lymphadenopathy- none Head- atraumatic            Eyes- Gross vision intact, PERRLA, conjunctivae and secretions clear            Ears- Hearing, canals-normal            Nose- Clear, no-Septal dev, mucus, polyps, erosion, perforation             Throat- Mallampati II , mucosa clear , drainage- none, tonsils- atrophic Neck- flexible , trachea midline, no stridor , thyroid nl, carotid no bruit Chest - symmetrical excursion , unlabored           Heart/CV- RRR , no murmur , no gallop  , no rub, nl s1 s2                           - JVD- none , edema- none, stasis changes- none, varices- none           Lung- +clear/ distant, wheeze- none, cough- none , dullness-none, rub- none           Chest wall-  Abd-  Br/ Gen/ Rectal- Not done, not indicated Extrem- cyanosis- none, clubbing, none, atrophy- none, strength- nl Neuro- grossly intact to observation

## 2024-07-27 ENCOUNTER — Encounter: Payer: Self-pay | Admitting: Internal Medicine

## 2024-07-27 ENCOUNTER — Ambulatory Visit: Payer: Medicare HMO | Admitting: Internal Medicine

## 2024-07-27 VITALS — BP 152/76 | HR 77 | Temp 97.9°F | Ht 71.0 in | Wt 208.6 lb

## 2024-07-27 DIAGNOSIS — G4733 Obstructive sleep apnea (adult) (pediatric): Secondary | ICD-10-CM | POA: Diagnosis not present

## 2024-07-27 NOTE — Patient Instructions (Signed)
 We are ok to continue CPAP auto 5-20. You should use it when in Recovery after your shoulder surgery.  Please let us  know if we can help.

## 2024-07-30 NOTE — Telephone Encounter (Signed)
 7.28.25 ov note faxed to [facility].

## 2024-07-31 DIAGNOSIS — H60332 Swimmer's ear, left ear: Secondary | ICD-10-CM | POA: Diagnosis not present

## 2024-07-31 DIAGNOSIS — Z9289 Personal history of other medical treatment: Secondary | ICD-10-CM | POA: Diagnosis not present

## 2024-07-31 DIAGNOSIS — H6121 Impacted cerumen, right ear: Secondary | ICD-10-CM | POA: Diagnosis not present

## 2024-09-30 ENCOUNTER — Telehealth (HOSPITAL_BASED_OUTPATIENT_CLINIC_OR_DEPARTMENT_OTHER): Payer: Self-pay | Admitting: *Deleted

## 2024-09-30 NOTE — Telephone Encounter (Signed)
   Name: Rodney Foster  DOB: August 19, 1950  MRN: [id number]  Primary Cardiologist: None  Chart reviewed as part of pre-operative protocol coverage. Because of Miliano Cotten Guttierrez's past medical history and time since last visit, he will require a follow-up in-office visit in order to better assess preoperative cardiovascular risk.  Pre-op covering staff: - Please schedule appointment and call patient to inform them. If patient already had an upcoming appointment within acceptable timeframe, please add pre-op clearance to the appointment notes so provider is aware. - Please contact requesting surgeon's office via preferred method (i.e, phone, fax) to inform them of need for appointment prior to surgery.   Abnormal Echo - needs DOD new pt appt, not heart first. Echo images need to be reviewed for WMA.     Jon Garre Oneda Duffett, PA  09/30/2024, 11:27 AM

## 2024-09-30 NOTE — Telephone Encounter (Signed)
 Duwaine Moats surgery scheduler called me today and stated that she has had this pt scheduled for surgery since 06/2024. The anesthesiology dept just informed Duwaine that the pt will need to see a cardiologist before being cleared. Pt will need new pt appt. Reason being per Megan, per anesthesiologist stated the pt had an echo 12/21/21 that was ordered by pulmonary due to pt had pneumonia. Per anesthesia to surgeon office now the pt needs an echo and referred to cardiology. Megan asked if there is any way our practice can help as to not delay the procedure for Monday 10/05/24. Duwaine states she has sent the clearance request over. I told her all I can do is have the preop APP review and the pt will need a new pt appt. Duwaine did say if we cannot get the pt in in time for surgery that is understandable. I told her we will see what we can do.  We will need to wait for the request to come over to our office.

## 2024-09-30 NOTE — Telephone Encounter (Signed)
   Pre-operative Risk Assessment    Patient Name: Rodney Foster  DOB: 11-Jun-1950 MRN: [id number]   Date of last office visit: NONE Date of next office visit: NONE- NEEDS NEW PT APPT    Request for Surgical Clearance    Procedure:  RIGHT REVERSE TOTAL SHOULDER ARTHROPLASTY  Date of Surgery:  Clearance 10/05/24                                Surgeon:  DR. ELSPETH HER Surgeon's Group or Practice Name:  [facility] Phone number:  [phone] MEGAN DAVIS Fax number:  [phone]   Type of Clearance Requested:   - Medical ; NONE INDICATED ON FORM TO BE HELD   Type of Anesthesia:  CHOICE   Additional requests/questions:    [name]   09/30/2024, 11:17 AM

## 2024-09-30 NOTE — Telephone Encounter (Signed)
 I s/w Duwaine Moats surgery scheduler for Dr. Kay. I updated her to let know that we can see the pt tomorrow 2:20 with Dr. Loni and that I left the pt a message to call back ASAP to confirm appt or change. Duwaine said she will reach out to the pt as well to call and confirm appt 10/01/24.

## 2024-09-30 NOTE — Telephone Encounter (Signed)
 Pt called and confirmed his new pt appt with Dr. Acharya 10/01/24 for preop clearance. Pt has been given the address to [employer].   Pt and his girlfriend that this cardiology appt is happening as the last minute before surgery. I explained that we just got the call this morning from the surgeon office. I explained that the surgeon office was just informed by Anesthesiologist that they need cardiac clearance due to echo 2022.   Pt states he just got down to [place] today at 2 pm and now he has to turn around in the AM for new pt appt. Pt and his girlfriend now that it is not cardiology fault. I did also reiterate that it was not Duwaine the surgery schedulers fault either.    I did s/w Megan and let her know the pt called and confirmed his new pt appt. Duwaine thanked our practice for all we do.

## 2024-09-30 NOTE — Telephone Encounter (Signed)
 I called Rodney Foster surgery scheduler again to see if she has spoken with the pt and instructed him to call [phone] and confirm new pt appt. Left message if the pt does not confirm by 5 pm today I have to cancel the appt.

## 2024-09-30 NOTE — Telephone Encounter (Signed)
 See previous notes from today. I called the pt to schedule a new pt appt for tomorrow with Dr. Loni @ 2:20 10/01/24. I left message that I will hold this appt time but I need him to call back today ASAP or the appt will be cancelled. Pt needs to confirm he can keep the new pt appt tomorrow 2:20 Dr. Acharya at [address], [place].   I will update the surgeon office as FYI that I called and left message for the pt.

## 2024-10-01 ENCOUNTER — Ambulatory Visit: Attending: Internal Medicine | Admitting: Internal Medicine

## 2024-10-01 ENCOUNTER — Encounter: Payer: Self-pay | Admitting: Internal Medicine

## 2024-10-01 VITALS — BP 138/72 | HR 80 | Ht 70.5 in | Wt 212.6 lb

## 2024-10-01 DIAGNOSIS — R931 Abnormal findings on diagnostic imaging of heart and coronary circulation: Secondary | ICD-10-CM | POA: Diagnosis not present

## 2024-10-01 DIAGNOSIS — E785 Hyperlipidemia, unspecified: Secondary | ICD-10-CM

## 2024-10-01 DIAGNOSIS — G4733 Obstructive sleep apnea (adult) (pediatric): Secondary | ICD-10-CM | POA: Diagnosis not present

## 2024-10-01 DIAGNOSIS — Z01810 Encounter for preprocedural cardiovascular examination: Secondary | ICD-10-CM | POA: Diagnosis not present

## 2024-10-01 DIAGNOSIS — I10 Essential (primary) hypertension: Secondary | ICD-10-CM

## 2024-10-01 DIAGNOSIS — I493 Ventricular premature depolarization: Secondary | ICD-10-CM | POA: Diagnosis not present

## 2024-10-01 DIAGNOSIS — J42 Unspecified chronic bronchitis: Secondary | ICD-10-CM

## 2024-10-01 DIAGNOSIS — I2510 Atherosclerotic heart disease of native coronary artery without angina pectoris: Secondary | ICD-10-CM | POA: Diagnosis not present

## 2024-10-01 NOTE — Patient Instructions (Addendum)
 Medication Instructions:   Your physician recommends that you continue on your current medications as directed. Please refer to the Current Medication list given to you today.   *If you need a refill on your cardiac medications before your next appointment, please call your pharmacy*  Lab Work: NONE ORDERED  TODAY    If you have labs (blood work) drawn today and your tests are completely normal, you will receive your results only by: MyChart Message (if you have MyChart) OR A paper copy in the mail If you have any lab test that is abnormal or we need to change your treatment, we will call you to review the results.    Testing/Procedures: 6 WEEKS Your physician has requested that you have an echocardiogram. Echocardiography is a painless test that uses sound waves to create images of your heart. It provides your doctor with information about the size and shape of your heart and how well your heart's chambers and valves are working. This procedure takes approximately one hour. There are no restrictions for this procedure. Please do NOT wear cologne, perfume, aftershave, or lotions (deodorant is allowed). Please arrive 15 minutes prior to your appointment time.    Please note: We ask at that you not bring children with you during ultrasound (echo/ vascular) testing. Due to room size and safety concerns, children are not allowed in the ultrasound rooms during exams. Our front office staff cannot provide observation of children in our lobby area while testing is being conducted. An adult accompanying a patient to their appointment will only be allowed in the ultrasound room at the discretion of the ultrasound technician under special circumstances. We apologize for any inconvenience.    Follow-Up: At [facility], you and your health needs are our priority.  As part of our continuing mission to provide you with exceptional heart care, our providers are all part of one team.  This  team includes your primary Cardiologist (physician) and Advanced Practice Providers or APPs (Physician Assistants and Nurse Practitioners) who all work together to provide you with the care you need, when you need it.   Your next appointment:  6 -8 week(s)    Provider:  AVAILABLE APP  We recommend signing up for the patient portal called MyChart.  Sign up information is provided on this After Visit Summary.  MyChart is used to connect with patients for Virtual Visits (Telemedicine).  Patients are able to view lab/test results, encounter notes, upcoming appointments, etc.  Non-urgent messages can be sent to your provider as well.   To learn more about what you can do with MyChart, go to [email or web address].   Other Instructions

## 2024-10-01 NOTE — Progress Notes (Signed)
 Cardiology Office Note:  .   Date:  10/01/2024  ID:  JONHATAN HEARTY, DOB [birth year], MRN [id number] PCP: [name], NP  [facility] Providers Cardiologist:  None    History of Present Illness: .   Rodney Foster is a 74 y.o. male.  Discussed the use of AI scribe software for clinical note transcription with the patient, who gave verbal consent to proceed.  History of Present Illness Rodney Foster is a 74 year old male who presents for preoperative evaluation for right shoulder replacement surgery.  He has hypertension, managed with amlodipine  5 mg daily and losartan  100 mg daily. His blood pressure was recently measured at 120/80. He takes atorvastatin  20 mg daily for hyperlipidemia and febuxostat  for gout. No history of heart disease, heart attacks, or strokes.  He is physically active, attending the gym daily to ride a bike for 20 minutes, covering over three miles, and also walks on a track. He experiences no chest discomfort or shortness of breath during these activities.  He has sleep apnea and uses a CPAP machine. He has a history of smoking but quit a long time ago. Occasional wheezing is present, none today, but there is no active chest pain or significant shortness of breath. He has had pneumonia twice, most recently in June, and has since been cautious about hygiene practices at the gym.  He previously underwent bilateral knee surgeries within last four years without any complications from anesthesia.    ROS: negative except per HPI above.  Studies Reviewed: SABRA   EKG Interpretation Date/Time:  Thursday October 01 2024 14:11:36 EDT Ventricular Rate:  83 PR Interval:  156 QRS Duration:  82 QT Interval:  362 QTC Calculation: 425 R Axis:   64  Text Interpretation: Sinus rhythm with sinus arrhythmia with occasional Premature ventricular complexes Nonspecific ST abnormality When compared with ECG of 06-Dec-2021 08:32, Vent. rate has decreased BY  63 BPM  Confirmed by Loni Rushing ([id number]) on 10/01/2024 2:25:26 PM    Results RADIOLOGY Chest CT: Calcification in coronary arteries and aortic valve (2024)  DIAGNOSTIC Echocardiogram: mildly reduced EF EKG: Normal rhythm with one premature beat (10/01/2024) Risk Assessment/Calculations:       Physical Exam:   VS:  BP (!) 146/72   Pulse 80   Ht 5' 10.5 (1.791 m)   Wt 212 lb 9.6 oz (96.4 kg)   SpO2 98%   BMI 30.07 kg/m    Repeat: 138/72 Wt Readings from Last 3 Encounters:  10/01/24 212 lb 9.6 oz (96.4 kg)  07/27/24 208 lb 9.6 oz (94.6 kg)  06/15/24 210 lb (95.3 kg)     Physical Exam VITALS: BP- 120/80 GENERAL: Alert, cooperative, well developed, no acute distress. HEENT: Normocephalic, normal oropharynx, moist mucous membranes. CHEST: Clear to auscultation bilaterally, no wheezes, rhonchi, or crackles. CARDIOVASCULAR: Normal heart rate and rhythm, S1 and S2 normal without murmurs. ABDOMEN: Soft, non-tender, non-distended, without organomegaly, normal bowel sounds. EXTREMITIES: No cyanosis or edema. NEUROLOGICAL: Cranial nerves grossly intact, moves all extremities without gross motor or sensory deficit.   ASSESSMENT AND PLAN: .    Assessment and Plan Assessment & Plan Preoperative cardiovascular risk assessment for right shoulder arthroplasty  -The patient is intermediate risk for intermediate risk procedure.  No further cardiovascular testing is required prior to the procedure due to lack of symptoms and well over 4 METS of activity.  If this level of risk is acceptable to the patient and surgical team, the patient should be  considered optimized from a cardiovascular standpoint.  Intermediate cardiovascular risk for right shoulder arthroplasty. No active chest pain or significant shortness of breath. Previous surgeries without complications. EKG shows normal rhythm with PVC. Coronary artery and aortic valve calcification noted on previous CT scan. Prior echocardiogram with  mildly reduced EF but during episode of illness. Needs repeat echo but this can occur after surgery given euvolemic status and no symptoms.  - Monitor for any new symptoms such as chest pain or significant shortness of breath before surgery.  Hypertension Blood pressure well-controlled with current medication regimen.  - Continue current antihypertensive medications: amlodipine  5 mg daily and losartan  100 mg daily.  Hyperlipidemia CAC Cholesterol levels well-controlled with atorvastatin . - Continue atorvastatin  20 mg daily.  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 No current symptoms suggestive of significant coronary artery disease. Atorvastatin  is being used to manage cholesterol and prevent further calcification. - Schedule echocardiogram 6-8 weeks post-surgery to monitor cardiac status given AV calcifications. No murmur on exam.   Obstructive sleep apnea Uses CPAP for management of obstructive sleep apnea. No issues reported with current management. - Continue use of CPAP device.        Soyla Merck, MD, FACC

## 2024-10-01 NOTE — Progress Notes (Signed)
 Will forward to preop APP to review notes from Dr. Acharya, as to any further recommendations needed. If not I will be happy to fax the notes to the surgeon's office.

## 2024-10-05 DIAGNOSIS — M19011 Primary osteoarthritis, right shoulder: Secondary | ICD-10-CM | POA: Diagnosis not present

## 2024-10-05 DIAGNOSIS — M25711 Osteophyte, right shoulder: Secondary | ICD-10-CM | POA: Diagnosis not present

## 2024-10-05 DIAGNOSIS — G8918 Other acute postprocedural pain: Secondary | ICD-10-CM | POA: Diagnosis not present

## 2024-10-05 DIAGNOSIS — S46011A Strain of muscle(s) and tendon(s) of the rotator cuff of right shoulder, initial encounter: Secondary | ICD-10-CM | POA: Diagnosis not present

## 2024-10-14 ENCOUNTER — Other Ambulatory Visit: Payer: Self-pay | Admitting: Nurse Practitioner

## 2024-10-14 DIAGNOSIS — N401 Enlarged prostate with lower urinary tract symptoms: Secondary | ICD-10-CM

## 2024-10-14 DIAGNOSIS — I10 Essential (primary) hypertension: Secondary | ICD-10-CM

## 2024-10-14 MED ORDER — TAMSULOSIN HCL 0.4 MG PO CAPS
0.4000 mg | ORAL_CAPSULE | Freq: Every day | ORAL | 1 refills | Status: AC
Start: 2024-10-14 — End: ?

## 2024-10-14 MED ORDER — ACYCLOVIR 400 MG PO TABS: 400.0000 mg | ORAL_TABLET | Freq: Every day | ORAL | 1 refills | Status: AC

## 2024-10-14 MED ORDER — AMLODIPINE BESYLATE 5 MG PO TABS: ORAL_TABLET | ORAL | 1 refills | Status: AC

## 2024-10-14 MED ORDER — LOSARTAN POTASSIUM 100 MG PO TABS: ORAL_TABLET | ORAL | 1 refills | Status: AC

## 2024-10-14 NOTE — Telephone Encounter (Signed)
 Copied from CRM #[id number]. Topic: Clinical - Medication Refill >> Oct 14, 2024 10:17 AM Debby BROCKS wrote: Medication: tamsulosin  (FLOMAX ) 0.4 MG CAPS capsule acyclovir  (ZOVIRAX ) 400 MG tablet amLODipine  (NORVASC ) 5 MG tablet losartan  (COZAAR ) 100 MG tablet  Has the patient contacted their pharmacy? Yes (Agent: If no, request that the patient contact the pharmacy for the refill. If patient does not wish to contact the pharmacy document the reason why and proceed with request.) (Agent: If yes, when and what did the pharmacy advise?)  This is the patient's preferred pharmacy:  [id number] - [place], [place] - [address] AT [facility] [address] [place] [address] Phone: [phone] Fax: [phone]  Is this the correct pharmacy for this prescription? Yes If no, delete pharmacy and type the correct one.   Has the prescription been filled recently? No  Is the patient out of the medication? No, but running low  Has the patient been seen for an appointment in the last year OR does the patient have an upcoming appointment? Yes  Can we respond through MyChart? Yes  Agent: Please be advised that Rx refills may take up to 3 business days. We ask that you follow-up with your pharmacy.

## 2024-10-20 DIAGNOSIS — Z96611 Presence of right artificial shoulder joint: Secondary | ICD-10-CM | POA: Diagnosis not present

## 2024-10-20 DIAGNOSIS — Z4889 Encounter for other specified surgical aftercare: Secondary | ICD-10-CM | POA: Diagnosis not present

## 2024-10-29 DIAGNOSIS — R29898 Other symptoms and signs involving the musculoskeletal system: Secondary | ICD-10-CM | POA: Diagnosis not present

## 2024-10-29 DIAGNOSIS — M25611 Stiffness of right shoulder, not elsewhere classified: Secondary | ICD-10-CM | POA: Diagnosis not present

## 2024-10-29 DIAGNOSIS — M25511 Pain in right shoulder: Secondary | ICD-10-CM | POA: Diagnosis not present

## 2024-11-03 DIAGNOSIS — R29898 Other symptoms and signs involving the musculoskeletal system: Secondary | ICD-10-CM | POA: Diagnosis not present

## 2024-11-03 DIAGNOSIS — M25511 Pain in right shoulder: Secondary | ICD-10-CM | POA: Diagnosis not present

## 2024-11-03 DIAGNOSIS — M25611 Stiffness of right shoulder, not elsewhere classified: Secondary | ICD-10-CM | POA: Diagnosis not present

## 2024-11-11 DIAGNOSIS — M25511 Pain in right shoulder: Secondary | ICD-10-CM | POA: Diagnosis not present

## 2024-11-11 DIAGNOSIS — R29898 Other symptoms and signs involving the musculoskeletal system: Secondary | ICD-10-CM | POA: Diagnosis not present

## 2024-11-11 DIAGNOSIS — M25611 Stiffness of right shoulder, not elsewhere classified: Secondary | ICD-10-CM | POA: Diagnosis not present

## 2024-11-13 DIAGNOSIS — M25611 Stiffness of right shoulder, not elsewhere classified: Secondary | ICD-10-CM | POA: Diagnosis not present

## 2024-11-13 DIAGNOSIS — R29898 Other symptoms and signs involving the musculoskeletal system: Secondary | ICD-10-CM | POA: Diagnosis not present

## 2024-11-13 DIAGNOSIS — M25511 Pain in right shoulder: Secondary | ICD-10-CM | POA: Diagnosis not present

## 2024-11-16 ENCOUNTER — Ambulatory Visit (HOSPITAL_COMMUNITY)
Admission: RE | Admit: 2024-11-16 | Discharge: 2024-11-16 | Disposition: A | Discharge status: Home / Self Care | Source: Ambulatory Visit | Attending: Cardiology | Admitting: Cardiology

## 2024-11-16 DIAGNOSIS — R931 Abnormal findings on diagnostic imaging of heart and coronary circulation: Secondary | ICD-10-CM | POA: Insufficient documentation

## 2024-11-16 DIAGNOSIS — E785 Hyperlipidemia, unspecified: Secondary | ICD-10-CM | POA: Diagnosis not present

## 2024-11-16 DIAGNOSIS — I10 Essential (primary) hypertension: Secondary | ICD-10-CM | POA: Diagnosis not present

## 2024-11-16 DIAGNOSIS — I77810 Thoracic aortic ectasia: Secondary | ICD-10-CM | POA: Diagnosis not present

## 2024-11-16 DIAGNOSIS — I358 Other nonrheumatic aortic valve disorders: Secondary | ICD-10-CM | POA: Diagnosis not present

## 2024-11-16 DIAGNOSIS — R9431 Abnormal electrocardiogram [ECG] [EKG]: Secondary | ICD-10-CM | POA: Insufficient documentation

## 2024-11-16 LAB — ECHOCARDIOGRAM COMPLETE
Area-P 1/2: 3.23 cm2
Est EF: 55
S' Lateral: 3.4 cm

## 2024-11-17 DIAGNOSIS — M25611 Stiffness of right shoulder, not elsewhere classified: Secondary | ICD-10-CM | POA: Diagnosis not present

## 2024-11-17 DIAGNOSIS — R29898 Other symptoms and signs involving the musculoskeletal system: Secondary | ICD-10-CM | POA: Diagnosis not present

## 2024-11-17 DIAGNOSIS — M25511 Pain in right shoulder: Secondary | ICD-10-CM | POA: Diagnosis not present

## 2024-11-19 DIAGNOSIS — Z4889 Encounter for other specified surgical aftercare: Secondary | ICD-10-CM | POA: Diagnosis not present

## 2024-11-19 DIAGNOSIS — M25611 Stiffness of right shoulder, not elsewhere classified: Secondary | ICD-10-CM | POA: Diagnosis not present

## 2024-11-19 DIAGNOSIS — R29898 Other symptoms and signs involving the musculoskeletal system: Secondary | ICD-10-CM | POA: Diagnosis not present

## 2024-11-19 DIAGNOSIS — M25511 Pain in right shoulder: Secondary | ICD-10-CM | POA: Diagnosis not present

## 2024-11-23 DIAGNOSIS — R29898 Other symptoms and signs involving the musculoskeletal system: Secondary | ICD-10-CM | POA: Diagnosis not present

## 2024-11-23 DIAGNOSIS — M25611 Stiffness of right shoulder, not elsewhere classified: Secondary | ICD-10-CM | POA: Diagnosis not present

## 2024-11-23 DIAGNOSIS — M25511 Pain in right shoulder: Secondary | ICD-10-CM | POA: Diagnosis not present

## 2024-11-26 ENCOUNTER — Ambulatory Visit: Payer: Self-pay | Admitting: Internal Medicine

## 2024-11-30 ENCOUNTER — Ambulatory Visit: Admitting: Physician Assistant

## 2024-12-02 DIAGNOSIS — M25611 Stiffness of right shoulder, not elsewhere classified: Secondary | ICD-10-CM | POA: Diagnosis not present

## 2024-12-02 DIAGNOSIS — R29898 Other symptoms and signs involving the musculoskeletal system: Secondary | ICD-10-CM | POA: Diagnosis not present

## 2024-12-02 DIAGNOSIS — M25511 Pain in right shoulder: Secondary | ICD-10-CM | POA: Diagnosis not present

## 2024-12-04 DIAGNOSIS — R29898 Other symptoms and signs involving the musculoskeletal system: Secondary | ICD-10-CM | POA: Diagnosis not present

## 2024-12-04 DIAGNOSIS — M25611 Stiffness of right shoulder, not elsewhere classified: Secondary | ICD-10-CM | POA: Diagnosis not present

## 2024-12-04 DIAGNOSIS — M25511 Pain in right shoulder: Secondary | ICD-10-CM | POA: Diagnosis not present

## 2024-12-10 ENCOUNTER — Other Ambulatory Visit: Payer: Self-pay | Admitting: Nurse Practitioner

## 2024-12-10 DIAGNOSIS — M1A9XX0 Chronic gout, unspecified, without tophus (tophi): Secondary | ICD-10-CM

## 2024-12-10 MED ORDER — FEBUXOSTAT 40 MG PO TABS: 40.0000 mg | ORAL_TABLET | Freq: Every day | ORAL | 1 refills | Status: AC

## 2024-12-10 NOTE — Telephone Encounter (Signed)
 Copied from CRM #[id number]. Topic: Clinical - Medication Refill >> Dec 10, 2024  8:32 AM Cherylann RAMAN wrote: Medication: febuxostat  (ULORIC ) 40 MG tablet  Has the patient contacted their pharmacy? Yes (Agent: If no, request that the patient contact the pharmacy for the refill. If patient does not wish to contact the pharmacy document the reason why and proceed with request.) (Agent: If yes, when and what did the pharmacy advise?)  This is the patient's preferred pharmacy:  [id number] - [place], [place] - [address] AT [facility] [address] [place] [address] Phone: [phone] Fax: [phone]  Is this the correct pharmacy for this prescription? Yes If no, delete pharmacy and type the correct one.   Has the prescription been filled recently? No  Is the patient out of the medication? Yes  Has the patient been seen for an appointment in the last year OR does the patient have an upcoming appointment? Yes  Can we respond through MyChart? Yes  Agent: Please be advised that Rx refills may take up to 3 business days. We ask that you follow-up with your pharmacy.

## 2024-12-14 ENCOUNTER — Ambulatory Visit: Payer: Self-pay | Admitting: Nurse Practitioner

## 2024-12-14 ENCOUNTER — Encounter: Payer: Self-pay | Admitting: Nurse Practitioner

## 2024-12-14 VITALS — BP 128/72 | HR 75 | Temp 97.1°F | Resp 18 | Ht 70.5 in | Wt 227.4 lb

## 2024-12-14 DIAGNOSIS — I10 Essential (primary) hypertension: Secondary | ICD-10-CM

## 2024-12-14 DIAGNOSIS — N401 Enlarged prostate with lower urinary tract symptoms: Secondary | ICD-10-CM

## 2024-12-14 DIAGNOSIS — G8929 Other chronic pain: Secondary | ICD-10-CM

## 2024-12-14 DIAGNOSIS — I5022 Chronic systolic (congestive) heart failure: Secondary | ICD-10-CM | POA: Insufficient documentation

## 2024-12-14 DIAGNOSIS — K219 Gastro-esophageal reflux disease without esophagitis: Secondary | ICD-10-CM

## 2024-12-14 DIAGNOSIS — M1A9XX0 Chronic gout, unspecified, without tophus (tophi): Secondary | ICD-10-CM

## 2024-12-14 DIAGNOSIS — G4733 Obstructive sleep apnea (adult) (pediatric): Secondary | ICD-10-CM

## 2024-12-14 DIAGNOSIS — J410 Simple chronic bronchitis: Secondary | ICD-10-CM

## 2024-12-14 DIAGNOSIS — E782 Mixed hyperlipidemia: Secondary | ICD-10-CM

## 2024-12-14 NOTE — Progress Notes (Signed)
 Careteam: Patient Care Team: [name], NP as PCP - General (Geriatric Medicine)  PLACE OF SERVICE:  [facility]  Advanced Directive information    Allergies[1]  Chief Complaint  Patient presents with   Medical Management of Chronic Issues    Six Months Follow-up for routine visit    HPI:  Discussed the use of AI scribe software for clinical note transcription with the patient, who gave verbal consent to proceed.  History of Present Illness Rodney Foster is a 74 year old male who presents for a six-month follow-up visit.  He is recovering from a recent right shoulder operation, a reverse total shoulder replacement due to arthritis. 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 He is taking Advil  for pain management, which he finds more effective than Tylenol .  He denies any recent gout flares and notes that his uric acid levels were within the therapeutic range six months ago. He is on Uloric  40 mg daily for gout management.  There are no changes in urinary frequency or flow, and he continues to take Flomax  0.4 mg daily for benign prostatic hyperplasia.  His cholesterol is managed with atorvastatin  20 mg daily at bedtime, and his LDL was at 79 at the last check, which is at goal.  He is taking amlodipine  5 mg daily and losartan  100 mg daily for blood pressure.  He continues to use a CPAP machine for sleep apnea, managed by [facility].  He takes omeprazole daily for indigestion without issues.  No issues with blood sugars, chest pains, palpitations, or breathing difficulties. Bowel movements are normal.     Review of Systems:  Review of Systems  Constitutional:  Negative for chills, fever and weight loss.  HENT:  Negative for tinnitus.   Respiratory:  Negative for cough, sputum production and shortness of breath.   Cardiovascular:  Negative for chest pain, palpitations and  leg swelling.  Gastrointestinal:  Negative for abdominal pain, constipation, diarrhea and heartburn.  Genitourinary:  Negative for dysuria, frequency and urgency.  Musculoskeletal:  Negative for back pain, falls, joint pain and myalgias.  Skin: Negative.   Neurological:  Negative for dizziness and headaches.  Psychiatric/Behavioral:  Negative for depression and memory loss. The patient does not have insomnia.     Past Medical History:  Diagnosis Date   Backache, unspecified    Bronchitis    Cough variant asthma 05/03/2017   Genital herpes    Gout    Hearing loss    Hemorrhoids    Hernia    Herpes simplex    Hyperglycemia    Hyperlipidemia    Hypertension    Impotence    Insomnia, unspecified    Osteoarthrosis, unspecified whether generalized or localized, unspecified site    Tinea pedis, right    Unspecified disorder of male genital organs    Past Surgical History:  Procedure Laterality Date   fracture left femur  1984   ganglion repair of left forarm  2000   IVP  59   ML old comp fracture   KNEE SURGERY Right 01/21/2019   B[facility]ley Millman    NASAL SEPTUM SURGERY  1997   TONSILLECTOMY  1959   TONSILLECTOMY     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   Procedure: TOTAL KNEE ARTHROPLASTY;  Surgeon: Shari Sieving, MD;  Location: [facility];  Service: Orthopedics;  Laterality: Right;   TOTAL KNEE ARTHROPLASTY Left 05/06/2020  Procedure: TOTAL KNEE ARTHROPLASTY;  Surgeon: Shari Sieving, MD;  Location: [facility];  Service: Orthopedics;  Laterality: Left;   UMBILICAL HERNIA REPAIR  2002   Social History:   reports that he quit smoking about 40 years ago. His smoking use included cigarettes. He started smoking about 55 years ago. He has a 15 pack-year smoking history. He quit smokeless tobacco use about 35 years ago. He reports that he does not currently use alcohol. He reports that he does not use drugs.  Family History  Problem Relation Age of Onset   COPD Mother    Aortic  aneurysm Father     Medications: Patient's Medications  New Prescriptions   No medications on file  Previous Medications   ACYCLOVIR  (ZOVIRAX ) 400 MG TABLET    Take 1 tablet (400 mg total) by mouth daily.   AMLODIPINE  (NORVASC ) 5 MG TABLET    Take one tablet by mouth once daily to control blood pressure   ATORVASTATIN  (LIPITOR) 20 MG TABLET    TAKE 1 TABLET BY MOUTH EVERYDAY AT BEDTIME   FEBUXOSTAT  (ULORIC ) 40 MG TABLET    Take 1 tablet (40 mg total) by mouth daily.   GUAIFENESIN  (MUCINEX ) 600 MG 12 HR TABLET    Take 600 mg by mouth 2 (two) times daily.   LOSARTAN  (COZAAR ) 100 MG TABLET    TAKE ONE BY MOUTH DAILY FOR BLOOD PRESSURE CONTROL   MULTIPLE VITAMINS-MINERALS (ANTIOXIDANT PO)    Take 30 mLs by mouth daily. Aronia Berry Juice   OMEPRAZOLE (PRILOSEC) 20 MG CAPSULE    Take 20 mg by mouth.   TADALAFIL  (CIALIS ) 20 MG TABLET    Take 1 tablet (20 mg total) by mouth daily as needed for erectile dysfunction.   TAMSULOSIN  (FLOMAX ) 0.4 MG CAPS CAPSULE    Take 1 capsule (0.4 mg total) by mouth daily.  Modified Medications   No medications on file  Discontinued Medications   No medications on file    Physical Exam:  Vitals:   12/14/24 1002  BP: 128/72  Pulse: 75  Resp: 18  Temp: (!) 97.1 F (36.2 C)  SpO2: 96%  Weight: 227 lb 6.4 oz (103.1 kg)  Height: 5' 10.5 (1.791 m)   Body mass index is 32.17 kg/m. Wt Readings from Last 3 Encounters:  12/14/24 227 lb 6.4 oz (103.1 kg)  10/01/24 212 lb 9.6 oz (96.4 kg)  07/27/24 208 lb 9.6 oz (94.6 kg)    Physical Exam Constitutional:      General: He is not in acute distress.    Appearance: He is well-developed. He is not diaphoretic.  HENT:     Head: Normocephalic and atraumatic.     Right Ear: External ear normal.     Left Ear: External ear normal.     Mouth/Throat:     Pharynx: No oropharyngeal exudate.  Eyes:     Conjunctiva/sclera: Conjunctivae normal.     Pupils: Pupils are equal, round, and reactive to light.   Cardiovascular:     Rate and Rhythm: Normal rate and regular rhythm.     Heart sounds: Normal heart sounds.  Pulmonary:     Effort: Pulmonary effort is normal.     Breath sounds: Normal breath sounds.  Abdominal:     General: Bowel sounds are normal.     Palpations: Abdomen is soft.  Musculoskeletal:        General: No tenderness.     Cervical back: Normal range of motion and neck supple.  Right lower leg: No edema.     Left lower leg: No edema.  Skin:    General: Skin is warm and dry.  Neurological:     Mental Status: He is alert and oriented to person, place, and time.     Labs reviewed: Basic Metabolic Panel: Recent Labs    12/16/23 1023 06/15/24 1048  NA 135 139  K 4.6 4.7  CL 101 101  CO2 24 26  GLUCOSE 89 85  BUN 14 13  CREATININE 0.70 0.74  CALCIUM  9.3 9.7   Liver Function Tests: Recent Labs    12/16/23 1023 06/15/24 1048  AST 20 22  ALT 28 20  BILITOT 0.5 0.4  PROT 6.9 6.7   No results for input(s): LIPASE, AMYLASE in the last 8760 hours. No results for input(s): AMMONIA in the last 8760 hours. CBC: Recent Labs    12/16/23 1023 06/15/24 1048  WBC 6.7 7.2  NEUTROABS 3,390 3,334  HGB 14.6 14.8  HCT 43.1 42.9  MCV 96.0 97.5  PLT 301 253   Lipid Panel: Recent Labs    06/15/24 1048  CHOL 200*  HDL 103  LDLCALC 79  TRIG 95  CHOLHDL 1.9   TSH: No results for input(s): TSH in the last 8760 hours. A1C: Lab Results  Component Value Date   HGBA1C 5.5 01/20/2019     Assessment/Plan Assessment and Plan Assessment & Plan Status post right reverse total shoulder arthroplasty due to right sided shoulder pain.  Healing well with pain relief.  - discussed Advil  for pain management with caution due to potential kidney issues. Tylenol  preferred - Checked kidney function.  Chronic gout No recent flares. Uric acid level was therapeutic six months ago. - Continue Uloric  40 mg daily.  Benign prostatic hyperplasia with lower  urinary tract symptoms - Continue Flomax  0.4 mg daily. No new or worsening symptoms.   Essential hypertension Blood pressure well controlled on current regimen. Recommend to continue low sodium diet. - Continue amlodipine  5 mg daily. - Continue losartan  100 mg daily. - Maintain low sodium diet.  Mixed hyperlipidemia LDL at goal of 79 mg/dL. Continues atorvastatin . - Continue atorvastatin  20 mg daily at bedtime. - Monitor cholesterol levels at next visit.  Obstructive sleep apnea Managed with CPAP by [facility]. - Continue CPAP therapy at bedtime  Gastroesophageal reflux disease No issues with current management. - Continue omeprazole daily.  Return in about 6 months (around 06/14/2025) for routine follow up.:   Kenan Moodie K. Caro BODILY [facility] [phone]      [1] No Known Allergies

## 2024-12-15 ENCOUNTER — Ambulatory Visit: Payer: Self-pay | Admitting: Nurse Practitioner

## 2024-12-15 LAB — CBC WITH DIFFERENTIAL/PLATELET
Absolute Lymphocytes: 3354 {cells}/uL (ref 850–3900)
Absolute Monocytes: 1069 {cells}/uL — ABNORMAL HIGH (ref 200–950)
Basophils Absolute: 101 {cells}/uL (ref 0–200)
Basophils Relative: 1.3 %
Eosinophils Absolute: 117 {cells}/uL (ref 15–500)
Eosinophils Relative: 1.5 %
HCT: 41.6 % (ref 39.4–51.1)
Hemoglobin: 14.3 g/dL (ref 13.2–17.1)
MCH: 31.8 pg (ref 27.0–33.0)
MCHC: 34.4 g/dL (ref 31.6–35.4)
MCV: 92.4 fL (ref 81.4–101.7)
MPV: 11.1 fL (ref 7.5–12.5)
Monocytes Relative: 13.7 %
Neutro Abs: 3159 {cells}/uL (ref 1500–7800)
Neutrophils Relative %: 40.5 %
Platelets: 287 Thousand/uL (ref 140–400)
RBC: 4.5 Million/uL (ref 4.20–5.80)
RDW: 11.6 % (ref 11.0–15.0)
Total Lymphocyte: 43 %
WBC: 7.8 Thousand/uL (ref 3.8–10.8)

## 2024-12-15 LAB — COMPREHENSIVE METABOLIC PANEL WITH GFR
AG Ratio: 2.1 (calc) (ref 1.0–2.5)
ALT: 18 U/L (ref 9–46)
AST: 15 U/L (ref 10–35)
Albumin: 4.4 g/dL (ref 3.6–5.1)
Alkaline phosphatase (APISO): 92 U/L (ref 35–144)
BUN: 15 mg/dL (ref 7–25)
CO2: 28 mmol/L (ref 20–32)
Calcium: 9.5 mg/dL (ref 8.6–10.3)
Chloride: 103 mmol/L (ref 98–110)
Creat: 0.82 mg/dL (ref 0.70–1.28)
Globulin: 2.1 g/dL (ref 1.9–3.7)
Glucose, Bld: 96 mg/dL (ref 65–139)
Potassium: 4.9 mmol/L (ref 3.5–5.3)
Sodium: 138 mmol/L (ref 135–146)
Total Bilirubin: 0.5 mg/dL (ref 0.2–1.2)
Total Protein: 6.5 g/dL (ref 6.1–8.1)
eGFR: 91 mL/min/1.73m2 (ref >=60)

## 2024-12-15 NOTE — Telephone Encounter (Signed)
 Letter has been mailed for normal results.

## 2024-12-17 ENCOUNTER — Telehealth: Payer: Self-pay

## 2024-12-17 NOTE — Telephone Encounter (Signed)
 Spoke with patient Rodney Foster. He has an appointment with [facility] on 12/18/24     Copied from CRM #[id number]. Topic: Clinical - Medical Advice >> Dec 14, 2024  3:01 PM [name] wrote: Reason for CRM: Pt calling in regards to CPAP machine. Pt stated the machine on his screen was supposed to show smiley faces but he is inquiring about the model of the equipment. Pt stated his mask keeps leaking and air escapes and makes noises, pt is  unsure if it is set right or not. Pt would like a callback regarding this. Pt stated the machine would show smiley faces if he had a good night rest but the new machine he is unsure if it is not working or if it is the model. Please f/u with pt regarding this.

## 2024-12-22 ENCOUNTER — Encounter: Payer: Medicare HMO | Admitting: Nurse Practitioner

## 2024-12-23 ENCOUNTER — Telehealth: Payer: Self-pay | Admitting: *Deleted

## 2024-12-23 NOTE — Telephone Encounter (Signed)
 duplicate

## 2024-12-29 ENCOUNTER — Encounter: Payer: Self-pay | Admitting: Nurse Practitioner

## 2024-12-29 ENCOUNTER — Ambulatory Visit (INDEPENDENT_AMBULATORY_CARE_PROVIDER_SITE_OTHER): Admitting: Nurse Practitioner

## 2024-12-29 ENCOUNTER — Telehealth: Payer: Self-pay | Admitting: *Deleted

## 2024-12-29 DIAGNOSIS — Z0000 Encounter for general adult medical examination without abnormal findings: Secondary | ICD-10-CM | POA: Diagnosis not present

## 2024-12-29 NOTE — Progress Notes (Signed)
 "  Chief Complaint  Patient presents with   Medicare Wellness    AWV     Subjective:   Rodney Foster is a 74 y.o. male who presents for a Medicare Annual Wellness Visit.  Visit info / Clinical Intake: Medicare Wellness Visit Type:: Subsequent Annual Wellness Visit Persons participating in visit and providing information:: patient Medicare Wellness Visit Mode:: Video Since this visit was completed virtually, some vitals may be partially provided or unavailable. Missing vitals are due to the limitations of the virtual format.: Unable to obtain vitals - no equipment If Telephone or Video please confirm:: I connected with patient using audio/video enable telemedicine. I verified patient identity with two identifiers, discussed telehealth limitations, and patient agreed to proceed. Patient Location:: Home Provider Location:: Office [facility] Pre-visit prep was completed: no AWV questionnaire completed by patient prior to visit?: no Living arrangements:: lives with spouse/significant other Patient's Overall Health Status Rating: very good Typical amount of pain: some Does pain affect daily life?: (!) yes Are you currently prescribed opioids?: no  Dietary Habits and Nutritional Risks How many meals a day?: 3 Eats fruit and vegetables daily?: yes Most meals are obtained by: preparing own meals In the last 2 weeks, have you had any of the following?: none Diabetic:: no  Functional Status Activities of Daily Living (to include ambulation/medication): Independent Ambulation: Independent Medication Administration: Independent Home Management (perform basic housework or laundry): Independent Manage your own finances?: yes Primary transportation is: driving Concerns about vision?: no *vision screening is required for WTM* Concerns about hearing?: no  Fall Screening Falls in the past year?: 0 Number of falls in past year: 0 Was there an injury with Fall?: 0 Fall Risk Category  Calculator: 0 Patient Fall Risk Level: Low Fall Risk  Fall Risk Patient at Risk for Falls Due to: No Fall Risks Fall risk Follow up: Falls evaluation completed  Home and Transportation Safety: All rugs have non-skid backing?: yes All stairs or steps have railings?: yes Grab bars in the bathtub or shower?: yes Have non-skid surface in bathtub or shower?: yes Good home lighting?: yes Regular seat belt use?: yes Hospital stays in the last year:: no  Cognitive Assessment Difficulty concentrating, remembering, or making decisions? : no Will 6CIT or Mini Cog be Completed: yes What year is it?: 0 points What month is it?: 0 points Give patient an address phrase to remember (5 components): [address] [place]  About what time is it?: 0 points Count backwards from 20 to 1: 0 points Say the months of the year in reverse: 0 points Repeat the address phrase from earlier: 0 points 6 CIT Score: 0 points  Advance Directives (For Healthcare) Does Patient Have a Medical Advance Directive?: Yes Does patient want to make changes to medical advance directive?: No - Patient declined Type of Advance Directive: Healthcare Power of [place]; Living will Copy of Healthcare Power of Attorney in Chart?: No - copy requested Copy of Living Will in Chart?: No - copy requested  Reviewed/Updated  Reviewed/Updated: Reviewed All (Medical, Surgical, Family, Medications, Allergies, Care Teams, Patient Goals)    Allergies (verified) Patient has no known allergies.   Current Medications (verified) Outpatient Encounter Medications as of 12/29/2024  Medication Sig   acyclovir  (ZOVIRAX ) 400 MG tablet Take 1 tablet (400 mg total) by mouth daily.   amLODipine  (NORVASC ) 5 MG tablet Take one tablet by mouth once daily to control blood pressure   atorvastatin  (LIPITOR) 20 MG tablet TAKE 1 TABLET BY  MOUTH EVERYDAY AT BEDTIME   febuxostat  (ULORIC ) 40 MG tablet Take 1 tablet (40 mg total) by mouth daily.    guaiFENesin  (MUCINEX ) 600 MG 12 hr tablet Take 600 mg by mouth 2 (two) times daily.   losartan  (COZAAR ) 100 MG tablet TAKE ONE BY MOUTH DAILY FOR BLOOD PRESSURE CONTROL   Multiple Vitamins-Minerals (ANTIOXIDANT PO) Take 30 mLs by mouth daily. Aronia Berry Juice   omeprazole (PRILOSEC) 20 MG capsule Take 20 mg by mouth.   tadalafil  (CIALIS ) 20 MG tablet Take 1 tablet (20 mg total) by mouth daily as needed for erectile dysfunction.   tamsulosin  (FLOMAX ) 0.4 MG CAPS capsule Take 1 capsule (0.4 mg total) by mouth daily.   No facility-administered encounter medications on file as of 12/29/2024.    History: Past Medical History:  Diagnosis Date   Backache, unspecified    Bronchitis    Cough variant asthma 05/03/2017   Genital herpes    Gout    Hearing loss    Hemorrhoids    Hernia    Herpes simplex    Hyperglycemia    Hyperlipidemia    Hypertension    Impotence    Insomnia, unspecified    Osteoarthrosis, unspecified whether generalized or localized, unspecified site    Tinea pedis, right    Unspecified disorder of male genital organs    Past Surgical History:  Procedure Laterality Date   fracture left femur  1984   ganglion repair of left forarm  2000   IVP  6   ML old comp fracture   KNEE SURGERY Right 01/21/2019   [facility] Millman    NASAL SEPTUM SURGERY  1997   TONSILLECTOMY  1959   TONSILLECTOMY     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   Procedure: TOTAL KNEE ARTHROPLASTY;  Surgeon: Shari Sieving, MD;  Location: [facility];  Service: Orthopedics;  Laterality: Right;   TOTAL KNEE ARTHROPLASTY Left 05/06/2020   Procedure: TOTAL KNEE ARTHROPLASTY;  Surgeon: Shari Sieving, MD;  Location: [facility];  Service: Orthopedics;  Laterality: Left;   UMBILICAL HERNIA REPAIR  2002   Family History  Problem Relation Age of Onset   COPD Mother    Aortic aneurysm Father    Social History   Occupational History   Not on file  Tobacco Use   Smoking status: Former    Current  packs/day: 0.00    Average packs/day: 1 pack/day for 15.0 years (15.0 ttl pk-yrs)    Types: Cigarettes    Start date: 12/31/1968    Quit date: 01/01/1984    Years since quitting: 41.0   Smokeless tobacco: Former    Quit date: 1990  Vaping Use   Vaping status: Never Used  Substance and Sexual Activity   Alcohol use: Not Currently   Drug use: No   Sexual activity: Yes   Tobacco Counseling Counseling given: Not Answered  SDOH Screenings   Food Insecurity: No Food Insecurity (12/29/2024)  Housing: Unknown (12/29/2024)  Transportation Needs: No Transportation Needs (12/29/2024)  Utilities: Not At Risk (12/29/2024)  Depression (PHQ2-9): Low Risk (12/29/2024)  Financial Resource Strain: Low Risk  (06/27/2024)   [facility] from [facility]  Tobacco Use: Medium Risk (12/29/2024)   See flowsheets for full screening details  Depression Screen PHQ 2 & 9 Depression Scale- Over the past 2 weeks, how often have you been bothered by any of the following problems? Little interest or pleasure in doing things: 0 Feeling down, depressed, or hopeless (PHQ Adolescent also includes...irritable): 0  PHQ-2 Total Score: 0 Trouble falling or staying asleep, or sleeping too much: 0 Feeling tired or having little energy: 1 Poor appetite or overeating (PHQ Adolescent also includes...weight loss): 0 Feeling bad about yourself - or that you are a failure or have let yourself or your family down: 0 Trouble concentrating on things, such as reading the newspaper or watching television (PHQ Adolescent also includes...like school work): 0 Moving or speaking so slowly that other people could have noticed. Or the opposite - being so fidgety or restless that you have been moving around a lot more than usual: 0 Thoughts that you would be better off dead, or of hurting yourself in some way: 0 PHQ-9 Total Score: 1     Goals Addressed   None          Objective:    There were no vitals filed for  this visit. There is no height or weight on file to calculate BMI.  Hearing/Vision screen Vision Screening - Comments:: [facility] Last Exam 2025 Immunizations and Health Maintenance Health Maintenance  Topic Date Due   Medicare Annual Wellness (AWV)  12/18/2024   COVID-19 Vaccine (10 - 2025-26 season) 03/29/2025   DTaP/Tdap/Td (4 - Td or Tdap) 12/15/2033   Pneumococcal Vaccine: 50+ Years  Completed   Influenza Vaccine  Completed   Hepatitis C Screening  Completed   Zoster Vaccines- Shingrix  Completed   Meningococcal B Vaccine  Aged Out   Colonoscopy  Discontinued   Fecal DNA (Cologuard)  Discontinued        Assessment/Plan:  This is a routine wellness examination for Estelle.  Patient Care Team: [name], NP as PCP - General (Geriatric Medicine)  I have personally reviewed and noted the following in the patients chart:   Medical and social history Use of alcohol, tobacco or illicit drugs  Current medications and supplements including opioid prescriptions. Functional ability and status Nutritional status Physical activity Advanced directives List of other physicians Hospitalizations, surgeries, and ER visits in previous 12 months Vitals Screenings to include cognitive, depression, and falls Referrals and appointments  No orders of the defined types were placed in this encounter.  In addition, I have reviewed and discussed with patient certain preventive protocols, quality metrics, and best practice recommendations. A written personalized care plan for preventive services as well as general preventive health recommendations were provided to patient.   Harlene POUR Caro, NP   12/29/2024     After Visit Summary: (MyChart) Due to this being a telephonic visit, the after visit summary with patients personalized plan was offered to patient via MyChart    "

## 2024-12-29 NOTE — Progress Notes (Signed)
 This service is provided via telemedicine  No vital signs collected/recorded due to the encounter was a telemedicine visit.   Location of patient (ex: home, work):  Home  Patient consents to a telephone visit:  Yes  Location of the provider (ex: office, home):  Office [place].   Name of any referring provider:  na  Names of all persons participating in the telemedicine service and their role in the encounter:  Hortense Ramal, Patient, Nelda Severe, CMA, Abbey Chatters, NP  Time spent on call:  7:11

## 2024-12-29 NOTE — Telephone Encounter (Signed)
 Mr. mahamud, metts are scheduled for a virtual visit with your provider today.    Just as we do with appointments in the office, we must obtain your consent to participate.  Your consent will be active for this visit and any virtual visit you Jonet Mathies have with one of our providers in the next 365 days.    If you have a MyChart account, I can also send a copy of this consent to you electronically.  All virtual visits are billed to your insurance company just like a traditional visit in the office.  As this is a virtual visit, video technology does not allow for your provider to perform a traditional examination.  This Kamalei Roeder limit your provider's ability to fully assess your condition.  If your provider identifies any concerns that need to be evaluated in person or the need to arrange testing such as labs, EKG, etc, we will make arrangements to do so.    Although advances in technology are sophisticated, we cannot ensure that it will always work on either your end or our end.  If the connection with a video visit is poor, we Jocelin Schuelke have to switch to a telephone visit.  With either a video or telephone visit, we are not always able to ensure that we have a secure connection.   I need to obtain your verbal consent now.   Are you willing to proceed with your visit today?   ZHI GEIER has provided verbal consent on 12/29/2024 for a virtual visit (video or telephone).   MayDonzell LABOR, CMA 12/29/2024  9:00 AM

## 2025-01-05 ENCOUNTER — Telehealth: Payer: Self-pay

## 2025-01-05 ENCOUNTER — Ambulatory Visit

## 2025-01-05 VITALS — BP 128/64 | HR 64 | Temp 97.6°F | Ht 70.0 in | Wt 223.8 lb

## 2025-01-05 DIAGNOSIS — Z87891 Personal history of nicotine dependence: Secondary | ICD-10-CM

## 2025-01-05 DIAGNOSIS — J410 Simple chronic bronchitis: Secondary | ICD-10-CM

## 2025-01-05 DIAGNOSIS — J45991 Cough variant asthma: Secondary | ICD-10-CM

## 2025-01-05 DIAGNOSIS — Z8701 Personal history of pneumonia (recurrent): Secondary | ICD-10-CM

## 2025-01-05 DIAGNOSIS — G4733 Obstructive sleep apnea (adult) (pediatric): Secondary | ICD-10-CM | POA: Diagnosis not present

## 2025-01-05 DIAGNOSIS — J479 Bronchiectasis, uncomplicated: Secondary | ICD-10-CM

## 2025-01-05 MED ORDER — ALBUTEROL SULFATE (2.5 MG/3ML) 0.083% IN NEBU: 2.5000 mg | INHALATION_SOLUTION | Freq: Two times a day (BID) | RESPIRATORY_TRACT | 12 refills | Status: AC

## 2025-01-05 NOTE — Patient Instructions (Signed)
" °  VISIT SUMMARY: Today, we discussed your ongoing issues with sleep apnea and respiratory symptoms. We reviewed your CPAP therapy and made some adjustments to improve its effectiveness. We also addressed your chronic bronchitis and bronchiectasis, providing new treatments to help manage your symptoms.  YOUR PLAN: -OBSTRUCTIVE SLEEP APNEA: Obstructive sleep apnea is a condition where your airway becomes blocked during sleep, causing breathing pauses. We adjusted your CPAP settings to a pressure range of 10-15 cmH2O and recommended that you try sleeping on your side to help improve your apnea control. We also ordered a hybrid mask for you, which should be more comfortable. We will follow up in a couple of months to see how these changes are working for you.  -CHRONIC BRONCHITIS AND BRONCHIECTASIS: Chronic bronchitis is long-term inflammation of the airways in your lungs, and bronchiectasis is a condition where the airways become widened and scarred. To help manage your symptoms, we ordered a chest CT to check your current lung status, prescribed nebulizer treatments to be used twice daily, and provided a flutter valve to help clear secretions. Your prescription has been sent to the pharmacy.  INSTRUCTIONS: Please follow up in a couple of months to reassess the effectiveness of your CPAP therapy. Use the nebulizer treatments twice daily as prescribed and use the flutter valve to help clear your airways. We will review the results of your chest CT once it is completed.                      Contains text generated by Abridge.                                 Contains text generated by Abridge.   "

## 2025-01-05 NOTE — Assessment & Plan Note (Addendum)
 Rodney Foster

## 2025-01-05 NOTE — Progress Notes (Signed)
 "  Pulmonology Office Visit   Subjective:  Patient ID: Rodney Foster, male    DOB: 20-Aug-1950  MRN: [id number]  Referred by: [name], NP  CC:  Chief Complaint  Patient presents with   Sleep Apnea    States too much air pressure in mask.  Unable to use CPAP    HPI Rodney Foster is a 75 y.o. male ex-smoker with OSA, chronic bronchitis and asthma presents as a follow-up for OSA and asthma management.  Respective notes from provider reviewed as appropriate to gather relevant information for patient care.   Discussed the use of AI scribe software for clinical note transcription with the patient, who gave verbal consent to proceed.  History of Present Illness   Rodney Foster is a 75 year old male with severe sleep apnea and chronic bronchitis who presents with issues related to CPAP compliance and respiratory symptoms.  He has been using CPAP therapy since 2020 for severe sleep apnea. Approximately a year ago, he received a new CPAP machine set at a pressure range of 5-20 cmH2O. Recently, the CPAP has been reaching its maximum pressure, and his residual AHI is 20.4, higher than the target of less than 5. He experiences discomfort and air leaks, particularly noted during the holidays. Since a shoulder surgery in October, he has been sleeping more on his back. He typically goes to bed around 10:30-11:00 PM, falls asleep quickly, and wakes up once or twice a night to urinate. He usually rises around 7-8 AM and occasionally takes a 45-minute to 1-hour nap in the afternoon. He feels more refreshed when the CPAP functions properly and denies snoring while using it.  He has a history of chronic bronchitis and bronchiectasis. He does not currently use any inhalers. He smoked a pack of cigarettes a day for about ten years, quitting approximately 40 years ago. He experiences occasional coughing with phlegm production, sometimes light yellow in color. He tries to walk a mile a day without issues.  He had pneumonia in December 2022, which required hospitalization. He denies any exposure to chemicals or fumes in his previous work as a designer, fashion/clothing.      OSA history: Diagnosed in 2020.  On CPAP.  Doing well up until middle of 2025.  Likely related to sleeping more on back because of shoulder surgery.  Lung Health: Functional status: Can walk long distance without significant short of breath. Smoking: Ex-smoker.  10 pack/year quit greater than 40 years. 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  No exposures.  PAP download compliance data: [employer] [facility] [employer] 11 Pressure: 5-20, median 16.2, max 19.8. Hours of usage: 5 hours 22 minutes Days used >4hr: 20/30 Leak: Minimal. AHI: 20.4.  Of note during previous compliance data his AHI was low at 7.7.  PRIOR TESTS and IMAGING: PSG/HSAT: HST 11/23/18- AHI 59.6/ hr, desaturation to 55%(average 85%), body weight 214 lbs   CT chest 2022: Bilateral basilar pneumonia. CT Chest: CT chest 2024 reviewed by me some bronchial wall thickening with bilateral bronchiectasis with ground glass changes right greater than left.  ECHO: Echo November 2025: EF 55%, normal right-sided pressures.  No valvular abnormalities.     11/07/2018   10:00 AM  Results of the Epworth flowsheet  Sitting and reading 0  Watching TV 2  Sitting, inactive in a public place (e.g. a theatre or a meeting) 3  As a passenger in a car for an hour without a break 0  Lying down to rest  in the afternoon when circumstances permit 3  Sitting and talking to someone 0  Sitting quietly after a lunch without alcohol 0  In a car, while stopped for a few minutes in traffic 0  Total score 8    Allergies: Patient has no known allergies. Current Medications[1] Past Medical History:  Diagnosis Date   Backache, unspecified    Bronchitis    Cough variant asthma 05/03/2017   Genital herpes    Gout    Hearing loss    Hemorrhoids    Hernia    Herpes simplex     Hyperglycemia    Hyperlipidemia    Hypertension    Impotence    Insomnia, unspecified    Osteoarthrosis, unspecified whether generalized or localized, unspecified site    Tinea pedis, right    Unspecified disorder of male genital organs    Past Surgical History:  Procedure Laterality Date   fracture left femur  1984   ganglion repair of left forarm  2000   IVP  83   ML old comp fracture   KNEE SURGERY Right 01/21/2019   [facility] Millman    NASAL SEPTUM SURGERY  1997   TONSILLECTOMY  1959   TONSILLECTOMY     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   Procedure: TOTAL KNEE ARTHROPLASTY;  Surgeon: Shari Sieving, MD;  Location: [facility];  Service: Orthopedics;  Laterality: Right;   TOTAL KNEE ARTHROPLASTY Left 05/06/2020   Procedure: TOTAL KNEE ARTHROPLASTY;  Surgeon: Shari Sieving, MD;  Location: [facility];  Service: Orthopedics;  Laterality: Left;   UMBILICAL HERNIA REPAIR  2002   Family History  Problem Relation Age of Onset   COPD Mother    Aortic aneurysm Father    Social History   Socioeconomic History   Marital status: Divorced    Spouse name: Not on file   Number of children: Not on file   Years of education: Not on file   Highest education level: Not on file  Occupational History   Not on file  Tobacco Use   Smoking status: Former    Current packs/day: 0.00    Average packs/day: 1 pack/day for 15.0 years (15.0 ttl pk-yrs)    Types: Cigarettes    Start date: 12/31/1968    Quit date: 01/01/1984    Years since quitting: 41.0   Smokeless tobacco: Former    Quit date: 1990  Vaping Use   Vaping status: Never Used  Substance and Sexual Activity   Alcohol use: Not Currently   Drug use: No   Sexual activity: Yes  Other Topics Concern   Not on file  Social History Narrative   Not on file   Social Drivers of Health   Tobacco Use: Medium Risk (01/05/2025)   Patient History    Smoking Tobacco Use: Former    Smokeless Tobacco Use: Former    Passive Exposure: Not on  Actuary Strain: Low Risk  (06/27/2024)   Received from [facility]   Overall Financial Resource Strain (CARDIA)    Difficulty of Paying Living Expenses: Not hard at all  Food Insecurity: No Food Insecurity (12/29/2024)   Epic    Worried About Radiation Protection Practitioner of Food in the Last Year: Never true    Ran Out of Food in the Last Year: Never true  Transportation Needs: No Transportation Needs (12/29/2024)   Epic    Lack of Transportation (Medical): No    Lack of Transportation (Non-Medical): No  Physical Activity:  Not on file  Stress: Not on file  Social Connections: Not on file  Intimate Partner Violence: Not At Risk (12/29/2024)   Epic    Fear of Current or Ex-Partner: No    Emotionally Abused: No    Physically Abused: No    Sexually Abused: No  Depression (PHQ2-9): Low Risk (12/29/2024)   Depression (PHQ2-9)    PHQ-2 Score: 0  Alcohol Screen: Not on file  Housing: Unknown (12/29/2024)   Epic    Unable to Pay for Housing in the Last Year: No    Number of Times Moved in the Last Year: Not on file    Homeless in the Last Year: No  Utilities: Not At Risk (12/29/2024)   Epic    Threatened with loss of utilities: No  Health Literacy: Not on file       Objective:  BP 128/64   Pulse 64   Temp 97.6 F (36.4 C) (Temporal)   Ht 5' 10 (1.778 m)   Wt 223 lb 12.8 oz (101.5 kg)   SpO2 97% Comment: room air  BMI 32.11 kg/m  BMI Readings from Last 3 Encounters:  01/05/25 32.11 kg/m  12/14/24 32.17 kg/m  10/01/24 30.07 kg/m    Physical Exam: Physical Exam   ENT: Normal mucosa. No hypertrophy of inferior turbinates. Tonsils are normal sized. Modified Mallampati score is normal. PULMONARY: Lungs clear to auscultation bilaterally, possible crackles at lung bases. CARDIOVASCULAR: Regular rate and rhythm, S1 S2 normal, no murmurs. ABDOMEN: Abdomen soft, nontender. Bowel sounds are normal. EXTREMITIES: No peripheral edema noted.       Diagnostic  Review:  Last metabolic panel Lab Results  Component Value Date   GLUCOSE 96 12/14/2024   NA 138 12/14/2024   K 4.9 12/14/2024   CL 103 12/14/2024   CO2 28 12/14/2024   BUN 15 12/14/2024   CREATININE 0.82 12/14/2024   EGFR 91 12/14/2024   CALCIUM  9.5 12/14/2024   PROT 6.5 12/14/2024   ALBUMIN 2.8 (L) 12/23/2021   LABGLOB 2.3 09/12/2015   AGRATIO 1.7 09/12/2015   BILITOT 0.5 12/14/2024   ALKPHOS 116 12/23/2021   AST 15 12/14/2024   ALT 18 12/14/2024   ANIONGAP 10 12/24/2021         Assessment & Plan:   Assessment & Plan OSA (obstructive sleep apnea)     Simple chronic bronchitis (HCC)     Cough variant asthma     Bronchiectasis without complication (HCC)  Orders:   CT Chest Wo Contrast; Future   Flutter valve; Future     Assessment and Plan    Obstructive sleep apnea Severe obstructive sleep apnea with suboptimal control. Residual AHI of 20.4 suggests possible central events. Recent shoulder surgery may affect sleep position, exacerbating apnea. Current full mask not preferred. - Adjusted CPAP settings to 10-15 cmH2O. - Advised side sleeping to improve apnea control. - Ordered hybrid mask from DME company. - Scheduled follow-up in a couple of months to reassess CPAP efficacy. If not working well will need BIPAP/CPAP titration.   Chronic bronchitis and bronchiectasis Chronic bronchitis with occasional cough and light yellow phlegm. History of smoking and past pneumonia with residual CT changes. No current inhaler use. - Ordered chest CT to assess current lung status. - Prescribed nebulizer treatments twice daily. - Provided flutter valve for secretion clearance.      Notes from Dr Neysa 07/27/2024 reviewed as to gather relevant information for patient care and formulating plan.  He/She was counselled about not driving  while drowsy which is common side effect of sleep related disorders.   Return in about 2 months (around 03/05/2025).   I personally  spent a total of 35 minutes in the care of the patient today including preparing to see the patient, getting/reviewing separately obtained history, performing a medically appropriate exam/evaluation, counseling and educating, placing orders, documenting clinical information in the EHR, independently interpreting results, and communicating results.   Zi Newbury, MD    [1]  Current Outpatient Medications:    acyclovir  (ZOVIRAX ) 400 MG tablet, Take 1 tablet (400 mg total) by mouth daily., Disp: 90 tablet, Rfl: 1   albuterol  (PROVENTIL ) (2.5 MG/3ML) 0.083% nebulizer solution, Take 3 mLs (2.5 mg total) by nebulization in the morning and at bedtime., Disp: 75 mL, Rfl: 12   amLODipine  (NORVASC ) 5 MG tablet, Take one tablet by mouth once daily to control blood pressure, Disp: 90 tablet, Rfl: 1   atorvastatin  (LIPITOR) 20 MG tablet, TAKE 1 TABLET BY MOUTH EVERYDAY AT BEDTIME, Disp: 90 tablet, Rfl: 3   febuxostat  (ULORIC ) 40 MG tablet, Take 1 tablet (40 mg total) by mouth daily., Disp: 90 tablet, Rfl: 1   guaiFENesin  (MUCINEX ) 600 MG 12 hr tablet, Take 600 mg by mouth 2 (two) times daily., Disp: , Rfl:    losartan  (COZAAR ) 100 MG tablet, TAKE ONE BY MOUTH DAILY FOR BLOOD PRESSURE CONTROL, Disp: 90 tablet, Rfl: 1   Multiple Vitamins-Minerals (ANTIOXIDANT PO), Take 30 mLs by mouth daily. Aronia Berry Juice, Disp: , Rfl:    omeprazole (PRILOSEC) 20 MG capsule, Take 20 mg by mouth., Disp: , Rfl:    tadalafil  (CIALIS ) 20 MG tablet, Take 1 tablet (20 mg total) by mouth daily as needed for erectile dysfunction., Disp: 4 tablet, Rfl: 3   tamsulosin  (FLOMAX ) 0.4 MG CAPS capsule, Take 1 capsule (0.4 mg total) by mouth daily., Disp: 90 capsule, Rfl: 1  "

## 2025-01-05 NOTE — Telephone Encounter (Signed)
" °  Patient will be seen by new sleep doctor today to finguer out what's going on     Copied from CRM #[id number]. Topic: Clinical - Medical Advice >> Dec 14, 2024  3:01 PM Rodney Foster wrote: Reason for CRM: Pt calling in regards to CPAP machine. Pt stated the machine on his screen was supposed to show smiley faces but he is inquiring about the model of the equipment. Pt stated his mask keeps leaking and air escapes and makes noises, pt is  unsure if it is set right or not. Pt would like a callback regarding this. Pt stated the machine would show smiley faces if he had a good night rest but the new machine he is unsure if it is not working or if it is the model. Please f/u with pt regarding this. >> Jan 04, 2025  3:04 PM Rodney Foster wrote: Pt is requesting call back concerning his CPAP machine. He has been unable to use it for the past 2 weeks and reports it most likely needs adjustment. Of note, he was seen by [employer] on 12/19 and was advised he may require adjustment. He is requesting call back asap as he has had great difficulty sleeping. Is pt of [facility]; however, has not been set up with new provider.   CB#  [phone] >> Dec 18, 2024  2:17 PM Rodney Foster wrote: Pt had appt with [employer] today(12/19) and was advised his ATTI was 20.4. They suggested he may need an adjustment to the machine or require a new machine such as a BiPAP.   Requested call back  CB#  [phone] "

## 2025-01-05 NOTE — Assessment & Plan Note (Addendum)
 SABRA

## 2025-01-05 NOTE — Telephone Encounter (Signed)
 Spoke with patient trying to get him seen by sleep doctor machine blowing to much air will call back since he is at an appointment to schedule      Copied from CRM #[id number]. Topic: Clinical - Medical Advice >> Dec 14, 2024  3:01 PM [name] wrote: Reason for CRM: Pt calling in regards to CPAP machine. Pt stated the machine on his screen was supposed to show smiley faces but he is inquiring about the model of the equipment. Pt stated his mask keeps leaking and air escapes and makes noises, pt is  unsure if it is set right or not. Pt would like a callback regarding this. Pt stated the machine would show smiley faces if he had a good night rest but the new machine he is unsure if it is not working or if it is the model. Please f/u with pt regarding this. >> Jan 04, 2025  3:04 PM [name] wrote: Pt is requesting call back concerning his CPAP machine. He has been unable to use it for the past 2 weeks and reports it most likely needs adjustment. Of note, he was seen by [facility] on 12/19 and was advised he may require adjustment. He is requesting call back asap as he has had great difficulty sleeping. Is pt of [facility]; however, has not been set up with new provider.   CB#  [phone] >> Dec 18, 2024  2:17 PM [name] wrote: Pt had appt with [employer] today(12/19) and was advised his ATTI was 20.4. They suggested he may need an adjustment to the machine or require a new machine such as a BiPAP.   Requested call back  CB#  [phone]

## 2025-01-08 ENCOUNTER — Telehealth: Payer: Self-pay

## 2025-01-08 NOTE — Telephone Encounter (Signed)
 Copied from CRM [id number]. Topic: Clinical - Medical Advice >> Jan 06, 2025  2:43 PM Dedra B wrote: Reason for CRM: Patient wanted to let Dr. Theodoro know that his AHI number was 17 this morning and it's supposed to be around 5. He would like his CPAP adjusted some more. He said it's too much and blowing the mask off his face.

## 2025-01-13 ENCOUNTER — Telehealth: Payer: Self-pay | Admitting: Pulmonary Disease

## 2025-01-13 NOTE — Telephone Encounter (Signed)
 Attempted to return patient's call about elevated AHI on CPAP device. Busy tone x2 attempts.  This is a known issue. Seen by Dr. Theodoro 01/05/2025 -- he had residual AHI per device of 20.4/hr despite a median pressure of 16.2 and max of 19.8. He felt this was c/f central events and I agree that this is very much a possibility (as opposed to residual obstruction).  CPAP pressure adjusted downwards at that visit from 10-20 to 10-15 cm H2O with subsequent downward trend in AHI observed, mostly device-reported AHI < 15 now.  Will notify Dr. Theodoro of this only partial improvement -- he may opt for further empiric tightening/reduction of pressure or in-lab CPAP/BiPAP Titration at his discretion -- either are reasonable.  Rodney JINNY Dales, MD

## 2025-01-13 NOTE — Telephone Encounter (Signed)
 Copied from CRM [id number]. Topic: Clinical - Medical Advice >> Jan 11, 2025  9:03 AM [name] wrote: Reason for CRM: Pt needs CPAP adjusted, pt stated AHI is over 14, it needs to be 5 or 6. Pt stated the AHI has gone back up to 14. Pt stated it needs to be adjusted back to 5 or 6. Please f/u with pt regarding this. Pt stated it stated on Friday and has been like this throughout the weekend.  -------------------------------------------------------------------------------------------------------------------------------------------  Routing message to Dr. Theodoro and Dr. Olena.  Download printed and given to Dr. Olena to review for any changes that may need to be made.  Dr. Olena, Please advise.  Thank you.

## 2025-01-15 ENCOUNTER — Ambulatory Visit
Admission: RE | Admit: 2025-01-15 | Discharge: 2025-01-15 | Disposition: A | Discharge status: Home / Self Care | Source: Ambulatory Visit

## 2025-01-15 ENCOUNTER — Ambulatory Visit: Admitting: Physician Assistant

## 2025-01-15 DIAGNOSIS — J479 Bronchiectasis, uncomplicated: Secondary | ICD-10-CM

## 2025-01-21 NOTE — Telephone Encounter (Signed)
 I called and spoke to pt. Pt informed of Dr Lavena note and verbalized understanding. Pt states he is okay with waiting until his upcoming appt to talk to Dr Theodoro first. NFN

## 2025-01-25 ENCOUNTER — Ambulatory Visit: Payer: Self-pay

## 2025-01-26 ENCOUNTER — Ambulatory Visit: Payer: Self-pay

## 2025-01-26 NOTE — Telephone Encounter (Signed)
 Unless the is medically indicated would not advise.

## 2025-01-26 NOTE — Telephone Encounter (Signed)
 FYI Only or Action Required?: Action required by provider: clinical question for provider.  Patient was last seen in primary care on 12/29/2024 by [name], NP.  Called Nurse Triage reporting Advice Only.  Symptoms began asymptomatic.  Interventions attempted: Other: n/a.  Symptoms are: n/a.  Triage Disposition: Call PCP When Office is Open  Patient/caregiver understands and will follow disposition?: No, wishes to speak with PCP  Reason for Disposition  Caller wants to use a complementary or alternative medicine  Answer Assessment - Initial Assessment Questions .  Answer Assessment - Initial Assessment Questions Patient called in to ask pcp medical advise on parasite cleanse. Denies GI symptoms, normal bm's, he denies any symptoms at this time, no known exposure to parasites. He states I have never done one and I would like too. He has not picked out any specific product for cleanse, he is researching and would like to know if pcp is ok with him doing a parasite cleanse. Please advise.  Protocols used: PCP Call - No Triage-A-AH, Medication Question Call-A-AH Copied from CRM #[id number]. Topic: Clinical - Medical Advice >> Jan 26, 2025  1:33 PM Cherylann RAMAN wrote: Reason for CRM: Patient is wanting to get a parasite cleanse but would like Provider Caro' medical opinion. Please contact patient at [phone] no time restraints on when to call.

## 2025-01-27 NOTE — Telephone Encounter (Signed)
 Patient aware of providers response

## 2025-03-01 ENCOUNTER — Ambulatory Visit

## 2025-06-14 ENCOUNTER — Ambulatory Visit: Admitting: Nurse Practitioner

## 2026-01-04 ENCOUNTER — Ambulatory Visit: Admitting: Nurse Practitioner
